# Patient Record
Sex: Female | Born: 1945
Health system: Southern US, Community
[De-identification: ages and names within clinical notes are randomized; demographics above are authoritative.]

## PROBLEM LIST (undated history)

## (undated) DIAGNOSIS — I5032 Chronic diastolic (congestive) heart failure: Secondary | ICD-10-CM

## (undated) DIAGNOSIS — F32A Depression, unspecified: Secondary | ICD-10-CM

## (undated) DIAGNOSIS — I252 Old myocardial infarction: Secondary | ICD-10-CM

## (undated) DIAGNOSIS — M797 Fibromyalgia: Secondary | ICD-10-CM

## (undated) DIAGNOSIS — A048 Other specified bacterial intestinal infections: Secondary | ICD-10-CM

## (undated) DIAGNOSIS — Z8673 Personal history of transient ischemic attack (TIA), and cerebral infarction without residual deficits: Secondary | ICD-10-CM

## (undated) DIAGNOSIS — F329 Major depressive disorder, single episode, unspecified: Secondary | ICD-10-CM

## (undated) DIAGNOSIS — M1990 Unspecified osteoarthritis, unspecified site: Secondary | ICD-10-CM

## (undated) DIAGNOSIS — I700 Atherosclerosis of aorta: Secondary | ICD-10-CM

## (undated) DIAGNOSIS — I5042 Chronic combined systolic (congestive) and diastolic (congestive) heart failure: Secondary | ICD-10-CM

## (undated) DIAGNOSIS — F1120 Opioid dependence, uncomplicated: Secondary | ICD-10-CM

## (undated) DIAGNOSIS — M5090 Cervical disc disorder, unspecified, unspecified cervical region: Secondary | ICD-10-CM

## (undated) DIAGNOSIS — Z860100 Personal history of colon polyps, unspecified: Secondary | ICD-10-CM

## (undated) DIAGNOSIS — T8859XA Other complications of anesthesia, initial encounter: Secondary | ICD-10-CM

## (undated) DIAGNOSIS — I2510 Atherosclerotic heart disease of native coronary artery without angina pectoris: Secondary | ICD-10-CM

## (undated) DIAGNOSIS — T4145XA Adverse effect of unspecified anesthetic, initial encounter: Secondary | ICD-10-CM

## (undated) DIAGNOSIS — G35 Multiple sclerosis: Secondary | ICD-10-CM

## (undated) DIAGNOSIS — F419 Anxiety disorder, unspecified: Secondary | ICD-10-CM

## (undated) DIAGNOSIS — I499 Cardiac arrhythmia, unspecified: Secondary | ICD-10-CM

## (undated) DIAGNOSIS — E785 Hyperlipidemia, unspecified: Secondary | ICD-10-CM

## (undated) DIAGNOSIS — Z8719 Personal history of other diseases of the digestive system: Secondary | ICD-10-CM

## (undated) DIAGNOSIS — J439 Emphysema, unspecified: Secondary | ICD-10-CM

## (undated) HISTORY — DX: Personal history of colon polyps, unspecified: Z86.0100

## (undated) HISTORY — DX: Hyperlipidemia, unspecified: E78.5

## (undated) HISTORY — DX: Anxiety disorder, unspecified: F41.9

## (undated) HISTORY — PX: OTHER SURGICAL HISTORY: SHX169

## (undated) HISTORY — DX: Personal history of other diseases of the digestive system: Z87.19

## (undated) HISTORY — DX: Emphysema, unspecified: J43.9

## (undated) HISTORY — DX: Other specified bacterial intestinal infections: A04.8

## (undated) HISTORY — DX: Old myocardial infarction: I25.2

## (undated) HISTORY — DX: Opioid dependence, uncomplicated: F11.20

## (undated) HISTORY — DX: Chronic combined systolic (congestive) and diastolic (congestive) heart failure: I50.42

## (undated) HISTORY — PX: CARDIAC CATHETERIZATION: SHX172

## (undated) HISTORY — DX: Multiple sclerosis: G35

## (undated) HISTORY — DX: Chronic diastolic (congestive) heart failure: I50.32

## (undated) HISTORY — DX: Atherosclerosis of aorta: I70.0

## (undated) HISTORY — DX: Unspecified osteoarthritis, unspecified site: M19.90

## (undated) HISTORY — DX: Personal history of transient ischemic attack (TIA), and cerebral infarction without residual deficits: Z86.73

## (undated) HISTORY — DX: Personal history of colonic polyps: Z86.010

## (undated) HISTORY — PX: TUBAL LIGATION: SHX77

## (undated) HISTORY — DX: Major depressive disorder, single episode, unspecified: F32.9

## (undated) HISTORY — DX: Depression, unspecified: F32.A

---

## 1998-03-02 ENCOUNTER — Other Ambulatory Visit: Admission: RE | Admit: 1998-03-02 | Discharge: 1998-03-02 | Payer: Self-pay | Admitting: Obstetrics and Gynecology

## 1998-03-23 ENCOUNTER — Ambulatory Visit (HOSPITAL_COMMUNITY): Admission: RE | Admit: 1998-03-23 | Discharge: 1998-03-23 | Payer: Self-pay | Admitting: *Deleted

## 1999-03-06 ENCOUNTER — Other Ambulatory Visit: Admission: RE | Admit: 1999-03-06 | Discharge: 1999-03-06 | Payer: Self-pay | Admitting: Obstetrics and Gynecology

## 1999-03-30 ENCOUNTER — Ambulatory Visit (HOSPITAL_COMMUNITY): Admission: RE | Admit: 1999-03-30 | Discharge: 1999-03-30 | Payer: Self-pay | Admitting: Sports Medicine

## 1999-03-30 ENCOUNTER — Encounter: Payer: Self-pay | Admitting: Sports Medicine

## 1999-04-13 ENCOUNTER — Encounter: Payer: Self-pay | Admitting: Sports Medicine

## 1999-04-13 ENCOUNTER — Ambulatory Visit (HOSPITAL_COMMUNITY): Admission: RE | Admit: 1999-04-13 | Discharge: 1999-04-13 | Payer: Self-pay | Admitting: Sports Medicine

## 2000-06-24 ENCOUNTER — Ambulatory Visit (HOSPITAL_COMMUNITY): Admission: RE | Admit: 2000-06-24 | Discharge: 2000-06-24 | Payer: Self-pay | Admitting: *Deleted

## 2001-06-01 ENCOUNTER — Other Ambulatory Visit: Admission: RE | Admit: 2001-06-01 | Discharge: 2001-06-01 | Payer: Self-pay | Admitting: Obstetrics and Gynecology

## 2001-11-28 ENCOUNTER — Emergency Department (HOSPITAL_COMMUNITY): Admission: EM | Admit: 2001-11-28 | Discharge: 2001-11-28 | Payer: Self-pay | Admitting: Emergency Medicine

## 2002-06-11 ENCOUNTER — Encounter: Payer: Self-pay | Admitting: Internal Medicine

## 2002-06-11 ENCOUNTER — Ambulatory Visit (HOSPITAL_COMMUNITY): Admission: RE | Admit: 2002-06-11 | Discharge: 2002-06-11 | Payer: Self-pay | Admitting: Internal Medicine

## 2003-08-02 ENCOUNTER — Encounter: Admission: RE | Admit: 2003-08-02 | Discharge: 2003-08-02 | Payer: Self-pay | Admitting: Internal Medicine

## 2003-08-12 ENCOUNTER — Other Ambulatory Visit: Admission: RE | Admit: 2003-08-12 | Discharge: 2003-08-12 | Payer: Self-pay | Admitting: Obstetrics and Gynecology

## 2004-11-04 ENCOUNTER — Emergency Department (HOSPITAL_COMMUNITY): Admission: EM | Admit: 2004-11-04 | Discharge: 2004-11-04 | Payer: Self-pay | Admitting: Emergency Medicine

## 2004-11-30 ENCOUNTER — Ambulatory Visit: Payer: Self-pay | Admitting: Internal Medicine

## 2004-12-06 ENCOUNTER — Ambulatory Visit: Payer: Self-pay | Admitting: Internal Medicine

## 2004-12-17 ENCOUNTER — Ambulatory Visit: Payer: Self-pay | Admitting: Internal Medicine

## 2004-12-21 ENCOUNTER — Ambulatory Visit: Payer: Self-pay | Admitting: Gastroenterology

## 2004-12-25 ENCOUNTER — Ambulatory Visit: Payer: Self-pay | Admitting: Gastroenterology

## 2005-01-01 ENCOUNTER — Emergency Department (HOSPITAL_COMMUNITY): Admission: EM | Admit: 2005-01-01 | Discharge: 2005-01-02 | Payer: Self-pay | Admitting: Emergency Medicine

## 2005-01-15 ENCOUNTER — Ambulatory Visit (HOSPITAL_COMMUNITY): Admission: RE | Admit: 2005-01-15 | Discharge: 2005-01-15 | Payer: Self-pay | Admitting: *Deleted

## 2005-01-15 ENCOUNTER — Encounter (INDEPENDENT_AMBULATORY_CARE_PROVIDER_SITE_OTHER): Payer: Self-pay | Admitting: *Deleted

## 2005-01-31 ENCOUNTER — Ambulatory Visit (HOSPITAL_COMMUNITY): Admission: RE | Admit: 2005-01-31 | Discharge: 2005-01-31 | Payer: Self-pay | Admitting: *Deleted

## 2005-02-18 ENCOUNTER — Encounter: Admission: RE | Admit: 2005-02-18 | Discharge: 2005-02-18 | Payer: Self-pay | Admitting: *Deleted

## 2005-09-27 ENCOUNTER — Other Ambulatory Visit: Admission: RE | Admit: 2005-09-27 | Discharge: 2005-09-27 | Payer: Self-pay | Admitting: Obstetrics and Gynecology

## 2006-11-25 ENCOUNTER — Other Ambulatory Visit: Admission: RE | Admit: 2006-11-25 | Discharge: 2006-11-25 | Payer: Self-pay | Admitting: Obstetrics and Gynecology

## 2007-01-17 ENCOUNTER — Inpatient Hospital Stay (HOSPITAL_COMMUNITY): Admission: EM | Admit: 2007-01-17 | Discharge: 2007-01-18 | Payer: Self-pay | Admitting: Emergency Medicine

## 2007-01-17 ENCOUNTER — Ambulatory Visit: Payer: Self-pay | Admitting: Internal Medicine

## 2008-06-01 ENCOUNTER — Encounter
Admission: RE | Admit: 2008-06-01 | Discharge: 2008-06-01 | Payer: Self-pay | Admitting: Physical Medicine & Rehabilitation

## 2008-07-04 ENCOUNTER — Ambulatory Visit (HOSPITAL_COMMUNITY): Admission: RE | Admit: 2008-07-04 | Discharge: 2008-07-04 | Payer: Self-pay | Admitting: Obstetrics and Gynecology

## 2008-11-15 ENCOUNTER — Encounter: Admission: RE | Admit: 2008-11-15 | Discharge: 2008-11-15 | Payer: Self-pay | Admitting: Internal Medicine

## 2008-11-29 ENCOUNTER — Encounter: Admission: RE | Admit: 2008-11-29 | Discharge: 2008-11-29 | Payer: Self-pay | Admitting: Gastroenterology

## 2008-12-09 DIAGNOSIS — F4310 Post-traumatic stress disorder, unspecified: Secondary | ICD-10-CM

## 2008-12-09 DIAGNOSIS — G35 Multiple sclerosis: Secondary | ICD-10-CM

## 2008-12-09 DIAGNOSIS — Z860100 Personal history of colon polyps, unspecified: Secondary | ICD-10-CM | POA: Insufficient documentation

## 2008-12-09 DIAGNOSIS — R32 Unspecified urinary incontinence: Secondary | ICD-10-CM

## 2008-12-09 DIAGNOSIS — F331 Major depressive disorder, recurrent, moderate: Secondary | ICD-10-CM | POA: Insufficient documentation

## 2008-12-09 DIAGNOSIS — IMO0002 Reserved for concepts with insufficient information to code with codable children: Secondary | ICD-10-CM | POA: Insufficient documentation

## 2008-12-09 DIAGNOSIS — Z8711 Personal history of peptic ulcer disease: Secondary | ICD-10-CM

## 2008-12-09 DIAGNOSIS — IMO0001 Reserved for inherently not codable concepts without codable children: Secondary | ICD-10-CM

## 2008-12-09 DIAGNOSIS — K589 Irritable bowel syndrome without diarrhea: Secondary | ICD-10-CM

## 2008-12-09 DIAGNOSIS — F411 Generalized anxiety disorder: Secondary | ICD-10-CM | POA: Insufficient documentation

## 2008-12-09 DIAGNOSIS — F329 Major depressive disorder, single episode, unspecified: Secondary | ICD-10-CM

## 2008-12-09 DIAGNOSIS — R109 Unspecified abdominal pain: Secondary | ICD-10-CM

## 2008-12-09 DIAGNOSIS — R0781 Pleurodynia: Secondary | ICD-10-CM

## 2008-12-09 DIAGNOSIS — M810 Age-related osteoporosis without current pathological fracture: Secondary | ICD-10-CM | POA: Insufficient documentation

## 2008-12-09 DIAGNOSIS — K5900 Constipation, unspecified: Secondary | ICD-10-CM | POA: Insufficient documentation

## 2009-04-13 ENCOUNTER — Other Ambulatory Visit: Admission: RE | Admit: 2009-04-13 | Discharge: 2009-04-13 | Payer: Self-pay | Admitting: Obstetrics and Gynecology

## 2009-11-01 HISTORY — PX: BACK SURGERY: SHX140

## 2009-11-18 ENCOUNTER — Emergency Department (HOSPITAL_COMMUNITY): Admission: EM | Admit: 2009-11-18 | Discharge: 2009-11-18 | Payer: Self-pay | Admitting: Emergency Medicine

## 2009-12-15 ENCOUNTER — Ambulatory Visit (HOSPITAL_COMMUNITY): Admission: RE | Admit: 2009-12-15 | Discharge: 2009-12-16 | Payer: Self-pay | Admitting: Neurological Surgery

## 2010-01-26 ENCOUNTER — Encounter: Admission: RE | Admit: 2010-01-26 | Discharge: 2010-01-26 | Payer: Self-pay | Admitting: Neurological Surgery

## 2010-02-07 ENCOUNTER — Encounter: Payer: Self-pay | Admitting: Neurological Surgery

## 2010-03-03 ENCOUNTER — Encounter: Payer: Self-pay | Admitting: Neurological Surgery

## 2010-04-05 ENCOUNTER — Ambulatory Visit (HOSPITAL_COMMUNITY): Admission: RE | Admit: 2010-04-05 | Discharge: 2010-04-05 | Payer: Self-pay | Admitting: Neurological Surgery

## 2010-04-25 ENCOUNTER — Encounter: Admission: RE | Admit: 2010-04-25 | Discharge: 2010-04-25 | Payer: Self-pay | Admitting: Internal Medicine

## 2010-05-14 ENCOUNTER — Ambulatory Visit (HOSPITAL_COMMUNITY)
Admission: RE | Admit: 2010-05-14 | Discharge: 2010-05-14 | Payer: Self-pay | Source: Home / Self Care | Attending: Neurological Surgery | Admitting: Neurological Surgery

## 2010-05-15 ENCOUNTER — Ambulatory Visit (HOSPITAL_COMMUNITY): Payer: Self-pay | Admitting: Psychiatry

## 2010-05-17 ENCOUNTER — Ambulatory Visit (HOSPITAL_COMMUNITY)
Admission: RE | Admit: 2010-05-17 | Discharge: 2010-05-17 | Payer: Self-pay | Source: Home / Self Care | Attending: Diagnostic Radiology | Admitting: Diagnostic Radiology

## 2010-06-01 ENCOUNTER — Ambulatory Visit (HOSPITAL_COMMUNITY): Payer: Self-pay | Admitting: Psychology

## 2010-06-04 ENCOUNTER — Ambulatory Visit (HOSPITAL_COMMUNITY): Payer: Self-pay | Admitting: Physician Assistant

## 2010-06-24 ENCOUNTER — Encounter: Payer: Self-pay | Admitting: Neurological Surgery

## 2010-07-05 ENCOUNTER — Encounter (HOSPITAL_COMMUNITY): Payer: Self-pay | Admitting: Physician Assistant

## 2010-07-09 ENCOUNTER — Other Ambulatory Visit: Payer: Self-pay | Admitting: Neurological Surgery

## 2010-07-18 ENCOUNTER — Other Ambulatory Visit: Payer: MEDICARE

## 2010-07-26 ENCOUNTER — Ambulatory Visit
Admission: RE | Admit: 2010-07-26 | Discharge: 2010-07-26 | Disposition: A | Discharge status: Home / Self Care | Payer: MEDICARE | Source: Ambulatory Visit | Attending: Neurological Surgery | Admitting: Neurological Surgery

## 2010-08-09 ENCOUNTER — Other Ambulatory Visit (HOSPITAL_COMMUNITY): Payer: Self-pay | Admitting: Nurse Practitioner

## 2010-08-09 ENCOUNTER — Other Ambulatory Visit: Payer: Self-pay | Admitting: Internal Medicine

## 2010-08-09 DIAGNOSIS — R911 Solitary pulmonary nodule: Secondary | ICD-10-CM

## 2010-08-14 LAB — CREATININE, SERUM
Creatinine, Ser: 0.64 mg/dL (ref 0.4–1.2)
GFR calc non Af Amer: >60 mL/min (ref >60)

## 2010-08-19 LAB — CBC
MCH: 32.8 pg (ref 26.0–34.0)
MCV: 95.7 fL (ref 78.0–100.0)
Platelets: 270 10*3/uL (ref 150–400)
RDW: 12.8 % (ref 11.5–15.5)
WBC: 9.9 10*3/uL (ref 4.0–10.5)

## 2010-08-19 LAB — DIFFERENTIAL
Basophils Relative: 0 % (ref 0–1)
Lymphocytes Relative: 28 % (ref 12–46)
Neutro Abs: 5.8 10*3/uL (ref 1.7–7.7)
Neutrophils Relative %: 59 % (ref 43–77)

## 2010-08-19 LAB — SURGICAL PCR SCREEN
MRSA, PCR: NEGATIVE
Staphylococcus aureus: POSITIVE — AB

## 2010-08-19 LAB — BASIC METABOLIC PANEL
CO2: 32 mEq/L (ref 19–32)
Calcium: 10.4 mg/dL (ref 8.4–10.5)
GFR calc non Af Amer: >60 mL/min (ref >60)
Potassium: 4.5 mEq/L (ref 3.5–5.1)

## 2010-08-19 LAB — APTT: aPTT: 26 seconds (ref 24–37)

## 2010-08-19 LAB — PROTIME-INR: INR: 0.93 (ref 0.00–1.49)

## 2010-08-24 ENCOUNTER — Ambulatory Visit (HOSPITAL_COMMUNITY)
Admission: RE | Admit: 2010-08-24 | Discharge: 2010-08-24 | Disposition: A | Discharge status: Home / Self Care | Payer: MEDICARE | Source: Ambulatory Visit | Attending: Nurse Practitioner | Admitting: Nurse Practitioner

## 2010-08-24 ENCOUNTER — Other Ambulatory Visit: Payer: MEDICARE

## 2010-08-24 DIAGNOSIS — Z09 Encounter for follow-up examination after completed treatment for conditions other than malignant neoplasm: Secondary | ICD-10-CM | POA: Insufficient documentation

## 2010-08-24 DIAGNOSIS — R911 Solitary pulmonary nodule: Secondary | ICD-10-CM

## 2010-08-24 DIAGNOSIS — J984 Other disorders of lung: Secondary | ICD-10-CM | POA: Insufficient documentation

## 2010-08-30 ENCOUNTER — Other Ambulatory Visit: Payer: Self-pay | Admitting: Internal Medicine

## 2010-08-30 DIAGNOSIS — E041 Nontoxic single thyroid nodule: Secondary | ICD-10-CM

## 2010-09-05 ENCOUNTER — Ambulatory Visit
Admission: RE | Admit: 2010-09-05 | Discharge: 2010-09-05 | Disposition: A | Discharge status: Home / Self Care | Payer: MEDICARE | Source: Ambulatory Visit | Attending: Internal Medicine | Admitting: Internal Medicine

## 2010-09-05 DIAGNOSIS — E041 Nontoxic single thyroid nodule: Secondary | ICD-10-CM

## 2010-09-17 ENCOUNTER — Encounter (HOSPITAL_BASED_OUTPATIENT_CLINIC_OR_DEPARTMENT_OTHER): Payer: Medicare Other | Admitting: Internal Medicine

## 2010-09-17 DIAGNOSIS — C3490 Malignant neoplasm of unspecified part of unspecified bronchus or lung: Secondary | ICD-10-CM

## 2010-09-17 DIAGNOSIS — M810 Age-related osteoporosis without current pathological fracture: Secondary | ICD-10-CM

## 2010-09-17 DIAGNOSIS — E7800 Pure hypercholesterolemia, unspecified: Secondary | ICD-10-CM

## 2010-09-17 DIAGNOSIS — J449 Chronic obstructive pulmonary disease, unspecified: Secondary | ICD-10-CM

## 2010-09-21 ENCOUNTER — Encounter: Payer: Self-pay | Admitting: Internal Medicine

## 2010-09-21 ENCOUNTER — Ambulatory Visit (INDEPENDENT_AMBULATORY_CARE_PROVIDER_SITE_OTHER): Payer: Medicare Other | Admitting: Internal Medicine

## 2010-09-21 VITALS — BP 130/78

## 2010-09-21 DIAGNOSIS — R911 Solitary pulmonary nodule: Secondary | ICD-10-CM

## 2010-09-21 DIAGNOSIS — J984 Other disorders of lung: Secondary | ICD-10-CM

## 2010-09-21 DIAGNOSIS — R0600 Dyspnea, unspecified: Secondary | ICD-10-CM | POA: Insufficient documentation

## 2010-09-21 DIAGNOSIS — R0609 Other forms of dyspnea: Secondary | ICD-10-CM

## 2010-09-21 DIAGNOSIS — R0989 Other specified symptoms and signs involving the circulatory and respiratory systems: Secondary | ICD-10-CM

## 2010-09-21 NOTE — Progress Notes (Signed)
  Subjective:    Patient ID: [name], female    DOB: 1945-07-04, 65 y.o.   MRN: [id number]  HPI 65 year old smoker, DJD, OA, Fibromyalgia, Osteoporosis, Hiatal Hernia., GERD, Thyroid nodules and on chronic narcs for chronic back pain, hix of skin cancers  Referred for LUL 8 mm apical-medial nodule by Dr. Shirline Frees. First had MRI mid-dec 2011 and noted to have LUL nodule. Followed by CT chest that confirmed same. Fu CT chest 3 months later in March showed no change (independently reviewed). She currently feels at baseline health with chronic pain and baseline dyspnea. States dyspnea is chronic, relieved by pro-air, brought on by treadmill exertion, present for few years, worse past few months, class 2 severity. Also, reports associated associated "burning all over lungs" that comes on treadmill but goes away after continued 10 minute of exertion. Associated chronic cough with clear mild mucoid sputum with occassional yellow sputum. REcollects cath 2002; had RCA spasm per records  She is not interested in quitting smoking, visiting MDs unless she has to, seeing cards for chest pain. Does not want me to be more aggressive with followup and workup of dyspnea   Review of Systems  Constitutional: Negative.   HENT: Negative.   Eyes: Negative.   Respiratory: Positive for cough and shortness of breath. Negative for choking, chest tightness and wheezing.   Cardiovascular: Positive for chest pain.  Gastrointestinal: Negative.   Genitourinary: Negative.   Musculoskeletal: Positive for myalgias, back pain and arthralgias.  Skin: Negative.   Neurological: Negative.   Hematological: Negative.   Psychiatric/Behavioral: Negative.          Objective:   Physical Exam  Vitals reviewed. Constitutional: She is oriented to person, place, and time. She appears well-developed and well-nourished. No distress.  HENT:  Head: Normocephalic and atraumatic.  Right Ear: External ear normal.  Left Ear:  External ear normal.  Mouth/Throat: Oropharynx is clear and moist. No oropharyngeal exudate.  Eyes: Conjunctivae and EOM are normal. Pupils are equal, round, and reactive to light. Right eye exhibits no discharge. Left eye exhibits no discharge. No scleral icterus.  Neck: Normal range of motion. Neck supple. No JVD present. No tracheal deviation present. No thyromegaly present.  Cardiovascular: Normal rate, regular rhythm, normal heart sounds and intact distal pulses.  Exam reveals no gallop and no friction rub.   No murmur heard. Pulmonary/Chest: Effort normal and breath sounds normal. No respiratory distress. She has no wheezes. She has no rales. She exhibits no tenderness.  Abdominal: Soft. Bowel sounds are normal. She exhibits no distension and no mass. There is no tenderness. There is no rebound and no guarding.  Musculoskeletal: Normal range of motion. She exhibits no edema and no tenderness.  Lymphadenopathy:    She has no cervical adenopathy.  Neurological: She is alert and oriented to person, place, and time. She has normal reflexes. No cranial nerve deficit. She exhibits normal muscle tone. Coordination normal.  Skin: Skin is warm and dry. No rash noted. She is not diaphoretic. No erythema. No pallor.  Psychiatric: She has a normal mood and affect. Her behavior is normal. Judgment and thought content normal.          Assessment & Plan:

## 2010-09-21 NOTE — Assessment & Plan Note (Signed)
Possibly due to undiagnosed copd   PLAN Spirometry at [facility]up Suggested she cards for angina equivalent bu she refused

## 2010-09-21 NOTE — Assessment & Plan Note (Signed)
LUL 8mm nodule noted Dec 2011. Stable march 2012. INtermediate prob for lung cancer due to age and smoking  PLAn Recommended ct chest in 3 months but she wants to do it only in 6 months

## 2010-09-21 NOTE — Patient Instructions (Signed)
Have repeat ct chest in 6 months Have simple spirometry at followup for workup of your shortness of breath Please quit smoking

## 2010-10-05 ENCOUNTER — Institutional Professional Consult (permissible substitution): Payer: Medicare Other | Admitting: Emergency Medicine

## 2010-10-16 NOTE — H&P (Signed)
[name], POL NO.:  [id number]   MEDICAL RECORD NO.:  [id number]          PATIENT TYPE:  INP   LOCATION:  1827                         FACILITY:  [facility]   PHYSICIAN:  Corwin Levins, MD      DATE OF BIRTH:  1946-02-05   DATE OF ADMISSION:  01/17/2007  DATE OF DISCHARGE:                              HISTORY & PHYSICAL   CHIEF COMPLAINT:  Burning mid and epigastric pain starting at 1:30 a.m.   HISTORY OF PRESENT ILLNESS:  [name] is a 65 year old white female  who was awakened at 1:30 this morning with severe pain and some  radiation towards the left chest with nausea and sweats; there was some  mild radiation towards the back as well.  She had some dry heaves, some  bowel change, but no hematemesis; there was some low-grade temperature.  She has chronic constipation, but does not think this is the factor.  She was noted to have elevated lipase in the ER.  She is now for  admission for further evaluation and treatment.  No prior history of  pancreatitis.  She was treated for H. pylori-positive in 2006 and no  documented history of peptic ulcer disease.  In fact, EGD negative 2006  although the patient was told she may have had some peptic ulcers that  had resolved by the time she had the EGD.   PAST MEDICAL HISTORY/ILLNESSES:  1. Coronary artery disease, noncritical by cath in 2002.  2. History of coronary vasospasm noted on catheterization in 2002.  3. Chronic smoker.  4. COPD.  5. Hypercholesterolemia.  6. Osteoporosis.  7. Fibromyalgia.  8. Anxiety.  9. Irritable bowel syndrome.  10.Multiple sclerosis diagnosed 12 years ago by [facility].  11.DJD.  12.LS spine disc disease.  13.Chronic sinusitis.   ALLERGIES:  SULFA.   CURRENT MEDICATIONS:  1. Aciphex 20 mg p.o. q.day.  2. Allegra one 81 mg p.o. q.day.  3. Doxepin 25 mg q.h.s.  4. Klonopin 0.5 mg t.i.d.  5. Neurontin 300 mg t.i.d.  6. Zetia 10 mg p.o. q.day.  7. Vicodin 7.5/500  p.r.n.  8. Provigil 200 mg t.i.d.  9. Premarin, unknown dose, one q.day.   SOCIAL HISTORY:  Tobacco, now less than one pack per day.  Alcohol:  None per patient.  She is currently separated.   FAMILY HISTORY:  Coronary artery disease; brother deceased with  pancreatic cancer as well.   REVIEW OF SYSTEMS:  Otherwise noncontributory.   PHYSICAL EXAMINATION:  VITAL SIGNS:  She is afebrile, blood pressure  148/68, heart rate 84, respirations 16, O2 saturation 98%.  HEENT EXAM:  Sclerae clear.  TMs clear.  Oropharynx benign.  NECK:  Without lymphadenopathy, JVD or thyromegaly.  CHEST:  No rales or wheezing.  CARDIAC EXAM:  Regular rate and rhythm.  ABDOMEN:  With moderate epigastric and mid-abdominal tenderness.  EXTREMITIES:  No edema.   LABS:  UA negative.  Lipase 187.  Sodium 134, potassium 3.4, chloride  97, bicarb 30, BUN 9, creatinine 0.7, glucose 77, white blood cell count  12.3, hemoglobin 13.9.  LFTs within  normal limits.  CPK-MB 2.0.  Troponin I less than 0.05.  Calcium 8.5.   ASSESSMENT/PLAN:  1. Acute pancreatitis.  To be admitted, made NPO and given IV fluids.      Will treat for symptoms.  Check CT of the abdomen and pelvis.  She      denies alcohol as a cause.  Will also check lipids.  Calcium normal      as above.  Doubt medication as cause.  2. Hypokalemia.  Replaced IV.  3. Hyponatremia.  IV fluids as above.  4. Other medical problems as listed above, otherwise stable.  Continue      home medications.  5. Prophylaxis.  Start proton pump inhibitor therapy and Lovenox      subcutaneously.  6. Code status.  Full.  7. Disposition for home when improved.      Corwin Levins, MD  Electronically Signed     JWJ/MEDQ  D:  01/17/2007  T:  01/17/2007  Job:  [id number]   cc:   Gretta Arab. Valentina Lucks, M.D.

## 2010-10-19 NOTE — Cardiovascular Report (Signed)
[place]  Patient:    [name], [name]                      MRN: [id number] Proc. Date: 06/24/00 Adm. Date:  [id number] Attending:  Meade Maw A                        Cardiac Catheterization  INDICATIONS:  Chest pain associated with residual uptake in the anterior apex on a Cardiolite.  BRIEF HISTORY:  [name] is a 65 year old female who was initially referred for evaluation of chest pain which occurred with excessive exertion. She would develop chest tightness and burning associated with shortness of breath while ambulating on inclines.  Coronary risk factors are significant for a strong family history, dyslipidemia, tobacco abuse.  DESCRIPTION OF PROCEDURE:  After obtaining written informed consent, the patient was brought to the cardiac catheterization lab in the postabsorptive state. Preoperative sedation was achieved using IV Versed.  The right groin was prepped and draped in the usual sterile fashion.  Local anesthesia was achieved using 1% Xylocaine.  A 6 French hemostasis sheath was placed into the right femoral artery.  Selective coronary angiography was performed using a JL4, JR4 Judkins catheter.  Single plane ventriculogram was performed in the RAO position using a 6 French pigtail curved catheter.  Multiple views were obtained.  The hemostasis sheath was flushed following each engagement.  FINDINGS:  Aortic pressure was 125/69, LV pressure was 125/14.  There was no significant gradient noted on pullback.  Single plane ventriculogram revealed normal wall motion with an ejection fraction of 65%.  CORONARY ANGIOGRAPHY:  The left main coronary artery bifurcated into the left anterior descending and circumflex vessel.  There was no significant disease in the left main coronary artery.  The left anterior descending.  The left anterior descending gave rise to a large D1 and large D2, and ended at the apex.  There was a 30 to  40% midvessel long lesion following the second diagonal.  Circumflex vessel.  The circumflex gave rise to a small OM1, OM2, and a moderate to large OM3 and went on to end as an AV groove vessel.  There was no significant disease in the circumflex vessel.  Right coronary artery.  There was initial spasm with engagement of the right coronary artery.  The right coronary artery gave rise to three small RV marginals and ended as a large PDA and large PL branch.  IMPRESSION: 1. Noncritical coronary artery disease involving the mid LAD. 2. Significant spasm of the right coronary artery with initial engagement. 3. Normal left ventricular function.  It is possible that the patients ongoing tobacco use is exacerbating the coronary spasm which could be resulting in chest pain.  The initial approach would be smoking cessation.  If this is unsuccessful would add a calcium channel blocker.  The patient was transferred to the holding area.  The hemostasis sheath was removed.  Hemostasis was achieved using digital pressure. DD:  06/24/00 TD:  06/24/00 Job: [id number]

## 2010-12-11 ENCOUNTER — Ambulatory Visit (HOSPITAL_COMMUNITY)
Admission: RE | Admit: 2010-12-11 | Discharge: 2010-12-11 | Disposition: A | Discharge status: Home / Self Care | Payer: Medicare Other | Source: Ambulatory Visit | Attending: Cardiology | Admitting: Cardiology

## 2010-12-11 DIAGNOSIS — E785 Hyperlipidemia, unspecified: Secondary | ICD-10-CM | POA: Insufficient documentation

## 2010-12-11 DIAGNOSIS — R079 Chest pain, unspecified: Secondary | ICD-10-CM | POA: Insufficient documentation

## 2010-12-11 DIAGNOSIS — IMO0001 Reserved for inherently not codable concepts without codable children: Secondary | ICD-10-CM | POA: Insufficient documentation

## 2010-12-11 DIAGNOSIS — K219 Gastro-esophageal reflux disease without esophagitis: Secondary | ICD-10-CM | POA: Insufficient documentation

## 2010-12-11 DIAGNOSIS — R0609 Other forms of dyspnea: Secondary | ICD-10-CM | POA: Insufficient documentation

## 2010-12-11 DIAGNOSIS — Z7982 Long term (current) use of aspirin: Secondary | ICD-10-CM | POA: Insufficient documentation

## 2010-12-11 DIAGNOSIS — Z8711 Personal history of peptic ulcer disease: Secondary | ICD-10-CM | POA: Insufficient documentation

## 2010-12-11 DIAGNOSIS — Z79899 Other long term (current) drug therapy: Secondary | ICD-10-CM | POA: Insufficient documentation

## 2010-12-11 DIAGNOSIS — K449 Diaphragmatic hernia without obstruction or gangrene: Secondary | ICD-10-CM | POA: Insufficient documentation

## 2010-12-11 DIAGNOSIS — M810 Age-related osteoporosis without current pathological fracture: Secondary | ICD-10-CM | POA: Insufficient documentation

## 2010-12-11 DIAGNOSIS — I2510 Atherosclerotic heart disease of native coronary artery without angina pectoris: Secondary | ICD-10-CM | POA: Insufficient documentation

## 2010-12-11 DIAGNOSIS — K589 Irritable bowel syndrome without diarrhea: Secondary | ICD-10-CM | POA: Insufficient documentation

## 2010-12-11 DIAGNOSIS — J4489 Other specified chronic obstructive pulmonary disease: Secondary | ICD-10-CM | POA: Insufficient documentation

## 2010-12-11 DIAGNOSIS — J449 Chronic obstructive pulmonary disease, unspecified: Secondary | ICD-10-CM | POA: Insufficient documentation

## 2010-12-11 DIAGNOSIS — R0989 Other specified symptoms and signs involving the circulatory and respiratory systems: Secondary | ICD-10-CM | POA: Insufficient documentation

## 2010-12-22 NOTE — Cardiovascular Report (Signed)
NAMECARLINDA, OHLSON NO.:  [id number]  MEDICAL RECORD NO.:  [id number]  LOCATION:  6522                         FACILITY:  [facility]  PHYSICIAN:  Pamella Pert, MD DATE OF BIRTH:  05-28-1946  DATE OF PROCEDURE:  12/11/2010 DATE OF DISCHARGE:  12/11/2010                           CARDIAC CATHETERIZATION   PROCEDURE PERFORMED: 1. Left ventriculography. 2. Selective right and left coronary arteriography.  INDICATION:  Ms. Yusupov is a pleasant 65 year old female with history of hyperlipidemia who was complaining of chest discomfort.  She also has abnormal EKG in the form of left bundle-branch block.  She underwent outpatient stress testing, which had revealed anterior wall ischemia with severe LV systolic dysfunction.  Given this and multiple cardiovascular risk factors, she is brought to the cardiac cath lab to evaluate for coronary anatomy.  HEMODYNAMIC DATA:  Left ventricular pressure was 133/-5 with end- diastolic pressure of 5 mmHg.  Aortic pressure was 131/63 with a mean of 88 mmHg.  There is no pressure gradient across the aortic valve.  ANGIOGRAPHIC DATA:  Left ventricle.  Left ventricular systolic function was in the lower limit of normal with ejection fraction of 45% to 50% with mild global hypokinesis.  There was no significant mitral regurgitation.  Right coronary artery.  Right coronary artery is a large-caliber vessel and a dominant vessel.  Smooth and normal.  Left main coronary artery.  Left main coronary artery is a large-caliber vessel.  Smooth and normal.  LAD.  LAD is a large-caliber vessel giving origin to a very large diagonal one which is equivalent to the LAD.  There is very mild luminal irregularity.  Circumflex coronary artery:  Circumflex coronary artery is a large- caliber vessel which is smooth and normal.  It gives origin to large high obtuse marginal one, which is ramus intermediate.  The circumflex itself is smooth and  normal.  Ramus intermediate.  Ramus intermediate is a moderate-to-large caliber vessel.  There is a proximal hazy 80% stenosis.  IMPRESSION:  Single-vessel coronary artery disease involving the ramus intermediate branch of the circumflex coronary artery.  RECOMMENDATIONS:  The stress test does not correlate with the ramus intermediate stenosis.  However, the stenosis is tight.  I suspect the abnormal stress test was related to left bundle-branch block.  I am going to attempt to do aggressive medical therapy as the anatomy is not necessarily very conducive for angioplasty, but it is doable.  If medical therapy fails and she continues to have chest discomfort, then I will consider angioplasty of the ramus intermediate branch.  This vessel with at least a 2.75 to up to 3 mm size.  TECHNIQUE OF THE PROCEDURE:  Under sterile precautions using a 6-French right radial access, 6-French multipurpose B2 catheter was advanced into the ascending aorta then to the left ventricle.  Left ventriculography was performed both in LAO and RAO projection.  Catheter pulled into the ascending aorta.  Right coronary artery selectively engaged. Angiography was performed and catheter was pulled out of body over exchange length J-wire.  Using Judkins left 3.5 diagnostic 5-French catheter, I was able to engage left main coronary artery and angiography was performed. Multiple views were obtained because  of difficulty and overlap.  Having perform angiograms, we decided to take the catheters out or the J-wire. Hemostasis was obtained by applying TR band.  The patient tolerated the procedure well.  No apparent complications.     Pamella Pert, MD     JRG/MEDQ  D:  12/11/2010  T:  12/12/2010  Job:  [id number]  cc:   Tomi Bamberger, NP  Electronically Signed by Yates Decamp MD on 12/22/2010 01:54:21 PM

## 2011-02-19 ENCOUNTER — Telehealth: Payer: Self-pay | Admitting: Internal Medicine

## 2011-02-19 DIAGNOSIS — R911 Solitary pulmonary nodule: Secondary | ICD-10-CM

## 2011-02-19 NOTE — Telephone Encounter (Signed)
Pt states it is time for ct scan and follow-up with MR. I have entered a new order for ct chest w/o contrast and once we have a date for that we can get her scheduled for follow-up with MR. Pt is aware that our PCC's will call her with this appt.

## 2011-03-08 ENCOUNTER — Ambulatory Visit (INDEPENDENT_AMBULATORY_CARE_PROVIDER_SITE_OTHER)
Admission: RE | Admit: 2011-03-08 | Discharge: 2011-03-08 | Disposition: A | Discharge status: Home / Self Care | Payer: Medicare Other | Source: Ambulatory Visit | Attending: Internal Medicine | Admitting: Internal Medicine

## 2011-03-08 DIAGNOSIS — R911 Solitary pulmonary nodule: Secondary | ICD-10-CM

## 2011-03-08 DIAGNOSIS — J984 Other disorders of lung: Secondary | ICD-10-CM

## 2011-03-14 ENCOUNTER — Encounter: Payer: Self-pay | Admitting: Internal Medicine

## 2011-03-14 ENCOUNTER — Ambulatory Visit (INDEPENDENT_AMBULATORY_CARE_PROVIDER_SITE_OTHER): Payer: Medicare Other | Admitting: Internal Medicine

## 2011-03-14 VITALS — BP 140/82 | HR 71 | Temp 98.0°F | Ht 61.0 in | Wt 118.8 lb

## 2011-03-14 DIAGNOSIS — R0602 Shortness of breath: Secondary | ICD-10-CM

## 2011-03-14 DIAGNOSIS — F4321 Adjustment disorder with depressed mood: Secondary | ICD-10-CM

## 2011-03-14 DIAGNOSIS — F17200 Nicotine dependence, unspecified, uncomplicated: Secondary | ICD-10-CM

## 2011-03-14 DIAGNOSIS — J984 Other disorders of lung: Secondary | ICD-10-CM

## 2011-03-14 DIAGNOSIS — R0600 Dyspnea, unspecified: Secondary | ICD-10-CM

## 2011-03-14 DIAGNOSIS — F4320 Adjustment disorder, unspecified: Secondary | ICD-10-CM

## 2011-03-14 DIAGNOSIS — R911 Solitary pulmonary nodule: Secondary | ICD-10-CM
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
## 2011-03-14 NOTE — Patient Instructions (Signed)
#  smoking  - glad you are working on quitting #Shortness of breath   - I think this is related to heart #Lung nodule - have ct chest without contrast in 6 months and followup then #Grief  - our sincere condolences on the passing away of your beloved dog #ROV  - 6 months with ct chest

## 2011-03-15 LAB — CBC
HCT: 37.7
HCT: 40.3
Hemoglobin: 13.9
MCHC: 34.4
MCHC: 34.4
MCV: 92.9
MCV: 93.3
Platelets: 250
Platelets: 250
RBC: 4.31
RDW: 12.4
RDW: 13.3
WBC: 12.3 — ABNORMAL HIGH

## 2011-03-15 LAB — BASIC METABOLIC PANEL
BUN: 2 — ABNORMAL LOW
CO2: 25
Chloride: 109
Glucose, Bld: 94
Potassium: 4

## 2011-03-15 LAB — URINALYSIS, ROUTINE W REFLEX MICROSCOPIC
Bilirubin Urine: NEGATIVE
Glucose, UA: NEGATIVE
Ketones, ur: NEGATIVE
Leukocytes, UA: NEGATIVE
Nitrite: NEGATIVE
Protein, ur: NEGATIVE
Specific Gravity, Urine: 1.007
Urobilinogen, UA: 0.2
pH: 7

## 2011-03-15 LAB — LIPID PANEL
Triglycerides: 94
VLDL: 19

## 2011-03-15 LAB — LIPASE, BLOOD: Lipase: 187 — ABNORMAL HIGH

## 2011-03-15 LAB — URINE MICROSCOPIC-ADD ON

## 2011-03-15 LAB — COMPREHENSIVE METABOLIC PANEL
ALT: 24
AST: 35
CO2: 30
Chloride: 97
Creatinine, Ser: 0.72
GFR calc Af Amer: >60
GFR calc non Af Amer: >60
Glucose, Bld: 77
Sodium: 134 — ABNORMAL LOW
Total Bilirubin: 0.4

## 2011-03-15 LAB — POCT CARDIAC MARKERS
CKMB, poc: 2
Myoglobin, poc: 38.8
Operator id: 284251
Troponin i, poc: <0.05

## 2011-03-15 LAB — DIFFERENTIAL
Basophils Absolute: 0
Basophils Relative: 0
Eosinophils Absolute: 0.3
Eosinophils Relative: 2
Lymphocytes Relative: 15
Lymphs Abs: 1.8
Monocytes Absolute: 0.9 — ABNORMAL HIGH
Monocytes Relative: 7
Neutro Abs: 9.2 — ABNORMAL HIGH
Neutrophils Relative %: 75

## 2011-03-15 LAB — COMPREHENSIVE METABOLIC PANEL WITH GFR
Albumin: 3.5
Alkaline Phosphatase: 79
BUN: 9
Calcium: 8.5
Potassium: 3.4 — ABNORMAL LOW
Total Protein: 5.6 — ABNORMAL LOW

## 2011-03-31 ENCOUNTER — Ambulatory Visit (HOSPITAL_COMMUNITY)
Admission: EM | Admit: 2011-03-31 | Discharge: 2011-04-01 | Disposition: A | Discharge status: Home / Self Care | Payer: Medicare Other | Attending: Cardiology | Admitting: Cardiology

## 2011-03-31 ENCOUNTER — Emergency Department (HOSPITAL_COMMUNITY): Payer: Medicare Other

## 2011-03-31 DIAGNOSIS — E785 Hyperlipidemia, unspecified: Secondary | ICD-10-CM | POA: Insufficient documentation

## 2011-03-31 DIAGNOSIS — M549 Dorsalgia, unspecified: Secondary | ICD-10-CM | POA: Insufficient documentation

## 2011-03-31 DIAGNOSIS — F17200 Nicotine dependence, unspecified, uncomplicated: Secondary | ICD-10-CM | POA: Insufficient documentation

## 2011-03-31 DIAGNOSIS — J4489 Other specified chronic obstructive pulmonary disease: Secondary | ICD-10-CM | POA: Insufficient documentation

## 2011-03-31 DIAGNOSIS — I200 Unstable angina: Secondary | ICD-10-CM | POA: Insufficient documentation

## 2011-03-31 DIAGNOSIS — I10 Essential (primary) hypertension: Secondary | ICD-10-CM | POA: Insufficient documentation

## 2011-03-31 DIAGNOSIS — G8929 Other chronic pain: Secondary | ICD-10-CM | POA: Insufficient documentation

## 2011-03-31 DIAGNOSIS — I2510 Atherosclerotic heart disease of native coronary artery without angina pectoris: Secondary | ICD-10-CM | POA: Insufficient documentation

## 2011-03-31 DIAGNOSIS — J449 Chronic obstructive pulmonary disease, unspecified: Secondary | ICD-10-CM | POA: Insufficient documentation

## 2011-03-31 LAB — CBC
MCH: 31.6 pg (ref 26.0–34.0)
MCHC: 35.5 g/dL (ref 30.0–36.0)
Platelets: 193 10*3/uL (ref 150–400)
RBC: 4.08 MIL/uL (ref 3.87–5.11)
RDW: 12.2 % (ref 11.5–15.5)

## 2011-03-31 LAB — BASIC METABOLIC PANEL
BUN: 9 mg/dL (ref 6–23)
CO2: 25 mEq/L (ref 19–32)
Calcium: 9.7 mg/dL (ref 8.4–10.5)
Creatinine, Ser: 0.58 mg/dL (ref 0.50–1.10)
GFR calc non Af Amer: >90 mL/min (ref >90)
Glucose, Bld: 115 mg/dL — ABNORMAL HIGH (ref 70–99)

## 2011-03-31 LAB — DIFFERENTIAL
Basophils Relative: 0 % (ref 0–1)
Eosinophils Absolute: 0.2 10*3/uL (ref 0.0–0.7)
Eosinophils Relative: 2 % (ref 0–5)
Monocytes Absolute: 0.4 10*3/uL (ref 0.1–1.0)
Monocytes Relative: 4 % (ref 3–12)
Neutrophils Relative %: 84 % — ABNORMAL HIGH (ref 43–77)

## 2011-03-31 LAB — PROTIME-INR: Prothrombin Time: 12.7 seconds (ref 11.6–15.2)

## 2011-04-01 DIAGNOSIS — F4320 Adjustment disorder, unspecified: Secondary | ICD-10-CM | POA: Insufficient documentation

## 2011-04-01 DIAGNOSIS — F4321 Adjustment disorder with depressed mood: Secondary | ICD-10-CM | POA: Insufficient documentation

## 2011-04-01 DIAGNOSIS — Z87891 Personal history of nicotine dependence: Secondary | ICD-10-CM | POA: Insufficient documentation

## 2011-04-01 LAB — CBC
Hemoglobin: 12.9 g/dL (ref 12.0–15.0)
MCH: 31.3 pg (ref 26.0–34.0)
MCV: 89.6 fL (ref 78.0–100.0)
RBC: 4.12 MIL/uL (ref 3.87–5.11)
WBC: 7.4 10*3/uL (ref 4.0–10.5)

## 2011-04-01 LAB — COMPREHENSIVE METABOLIC PANEL
ALT: 42 U/L — ABNORMAL HIGH (ref 0–35)
AST: 51 U/L — ABNORMAL HIGH (ref 0–37)
Albumin: 3.5 g/dL (ref 3.5–5.2)
CO2: 28 mEq/L (ref 19–32)
Calcium: 9.4 mg/dL (ref 8.4–10.5)
Chloride: 101 mEq/L (ref 96–112)
Creatinine, Ser: 0.64 mg/dL (ref 0.50–1.10)
GFR calc non Af Amer: >90 mL/min (ref >90)
Sodium: 136 mEq/L (ref 135–145)
Total Bilirubin: 0.2 mg/dL — ABNORMAL LOW (ref 0.3–1.2)

## 2011-04-01 LAB — CARDIAC PANEL(CRET KIN+CKTOT+MB+TROPI)
Relative Index: INVALID (ref 0.0–2.5)
Troponin I: <0.3 ng/mL (ref <0.30)
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
## 2011-04-01 NOTE — Assessment & Plan Note (Signed)
#  Shortness of breath - Spriometry Oct 2012 - normal. Cath July 2012: ef 45%. Dyspnea likely cardiac in nature   - I think this is related to heart  PLAN Fu with Dr Shon Baton When she  Is emotinally ready I will rfer to cardiac/pulmonary rehab

## 2011-04-01 NOTE — Assessment & Plan Note (Signed)
LUL 8mm nodule. Dec 2011 -> March 2012 -> Oct 2012 stable. Intermediate prob for cancer. On surveilance  PLAN  - have ct chest without contrast in 6 months and followup then

## 2011-04-01 NOTE — Assessment & Plan Note (Signed)
#  smoking  - glad you are working on quitting

## 2011-04-02 NOTE — Cardiovascular Report (Signed)
NAMETIASIA, WEBERG NO.:  [id number]  MEDICAL RECORD NO.:  [id number]  LOCATION:  2022                         FACILITY:  [facility]  PHYSICIAN:  Pamella Pert, MD DATE OF BIRTH:  1945/08/26  DATE OF PROCEDURE:  04/01/2011 DATE OF DISCHARGE:  04/01/2011                           CARDIAC CATHETERIZATION   PROCEDURE PERFORMED: 1. Left ventriculography. 2. Selective right and left coronary angiography. 3. Right femoral arteriography and closure of right femoral arterial     access with Perclose.  INDICATION:  Ms. [name] is a a 65 year old female who has known coronary artery disease.  She has a moderate sized small area distribution obtuse marginal 1 (ramus intermediate stenosis by cardiac catheterization that was done in July 2012).  She has been aggressively managed in the outpatient basis for chronic stable angina.  She was admitted yesterday with symptoms suggestive of unstable angina.  She is now brought back to the cardiac catheterization lab with intention of relook at the coronary anatomy and also possible angioplasty.  HEMODYNAMIC DATA:  The left ventricular pressure was 108/5 with end diastolic pressure of 16 mmHg.  Aortic pressure was 106/60 with a mean of 79 mmHg.  There was no pressure gradient across the aortic valve.  ANGIOGRAPHIC DATA:  Left ventricle:  Left ventricle systolic function was normal with ejection fraction of 55%.  There is mild mid to distal anterolateral hypokinesis probably related to underlying left bundle- branch block.  No significant mitral regurgitation was evident.  Right coronary artery:  Right coronary artery is a large vessel and a dominant vessel.  It is smooth and normal.  Left main coronary artery:  Left main coronary artery is a large caliber vessel.  It is smooth and normal.  LAD:  LAD is a large vessel giving origin to large diagonal 1.  LAD and diagonal 1 are equal sized and slightly smooth with  very mild luminal irregularity.  There is mild calcification noted in the proximal segment.  Circumflex coronary artery:  Circumflex coronary artery is a large caliber vessel which is smooth and normal.  It gives origin to high obtuse marginal 1.  This obtuse marginal 1 is a moderate calibered vessel in the proximal segment, however, quickly tapers off from the mid to distal segment and supplies relatively a smaller area which is acute angle takeoff across the proximal circumflex.  Circumflex itself is a large caliber vessel.  In spite of multiple angulations, angulated views, I was unable to see the vessel anatomy, origin well.  There is again mild calcifications evident into the proximal circumflex and also this obtuse marginal branch of the circumflex coronary artery. There appears to be progression of this obtuse marginal stenosis from previous 70% -80% to 80-90% now.  IMPRESSION:  Progression of obtuse marginal stenosis.  Single vessel coronary artery disease.  Extremely difficult to visualize anatomy in spite of 135 mL of contrast and multiple angulated views that were taken.  RECOMMENDATION:  Based on the anatomy and the difficulty of visualizing the complete vessel vasculature, I would recommend continuing medical therapy.  Fortunately there has not been any significant progression of coronary disease in rest of the vessels.  Hence  she can be safely be discharged home today with outpatient followup.  I have discussed the findings with the patient.  TECHNIQUE OF PROCEDURE:  Under sterile precautions using a 6-French right femoral arterial access, 6-French multipurpose B2 catheter was advanced into the ascending aorta, then into the left ventricle.  Left ventriculography was performed in the RAO projection.  Catheter pulled into the ascending aorta.  Right coronary selectively engaged and angiography was performed.  Then, the catheter pulled out of the body. A 5-French  Judkins left 4 diagnostic catheter was utilized to engage left main coronary artery and angiography was performed.  The catheter was then pulled out of body over a J-wire.  Right femoral arteriography was performed through the arterial access sheath and access was closed with Perclose with excellent hemostasis. The patient tolerated the procedure.  No immediate complication noted.     Pamella Pert, MD     JRG/MEDQ  D:  04/01/2011  T:  04/01/2011  Job:  [id number]  cc:   Tomi Bamberger, NP  Electronically Signed by Yates Decamp MD on 04/02/2011 08:03:43 PM

## 2011-04-02 NOTE — H&P (Signed)
NAMESUN, KIHN NO.:  [id number]  MEDICAL RECORD NO.:  [id number]  LOCATION:  2022                         FACILITY:  [facility]  PHYSICIAN:  Pamella Pert, MD DATE OF BIRTH:  10/23/1945  DATE OF ADMISSION:  03/31/2011 DATE OF DISCHARGE:                             HISTORY & PHYSICAL   REASON FOR ADMISSION:  Unstable angina.  HISTORY:  Ms. [name] is a pleasant 65 year old female with history of known coronary artery disease.  By cardiac catheterization, she was found to have a high-grade ramus intermedius stenosis of 80% on December 11, 2010.  She has been on aggressive medical therapy and had been doing well.  But, however, last evening, she started to have left arm pain with radiation up from the upper chest.  She took sublingual nitroglycerin, but it took a longer time than usual for it to be subsiding.  Hence, she presented to the emergency department.  When she presented to the emergency room, she was given sublingual nitroglycerin and aspirin.  Her chest pain had resolved.  She was then admitted for rule out protocol.  Presently, she is asymptomatic and denies any chest pain or any shortness of breath, except for baseline shortness of breath.  She has no PND or orthopnea.  No associated nausea, vomiting, or diaphoresis with the chest discomfort.  REVIEW OF SYSTEMS:  She denies any bowel or bladder disturbances. Denies any neurological weakness.  Denies recent weight gain or weight loss.  No hemoptysis.  No syncope.  No palpitations.  She does have peptic ulcer disease, but has not had any GI bleed.  She has hiatal hernia and irritable bowel syndrome.  Other systems are negative.  PAST MEDICAL HISTORY:  Significant for: 1. COPD. 2. History of pancreatitis in 2008. 3. Tubal ligation in 1979. 4. Chronic back pain and back surgery in 2011.  FAMILY HISTORY:  There is strong family history of premature coronary artery disease with father  dying with myocardial infarction at the age of 61.  Mother died at the age of 64 from myocardial infarction.  PHYSICAL EXAMINATION:  GENERAL:  She is moderately built and frail.  She appears to be in no acute distress. VITAL SIGNS:  Temperature of 97.9, pulse is 70 beats per minute, respiration 14, blood pressure 103/62 mmHg. CARDIAC:  S1, S2 is normal without any gallop or murmur. CHEST:  Clear. ABDOMEN:  Soft. EXTREMITIES:  No edema.  Peripheral arterial exam was normal.  Her EKG demonstrates underlying left sinus rhythm with left bundle- branch block.  LABORATORY DATA:  Her labs reveal hemoglobin of 12.9, hematocrit of 36.9, white count was 7.4 with a platelet count of 214,000. Electrolytes, BUN, and creatinine are within normal limits.  Blood sugar was slightly elevated at 115.  Troponin x2 and CPK x1 is negative for myocardial injury.  IMPRESSION: 1. Chest pain, suggestive of unstable angina. 2. Hyperlipidemia. 3. Chronic obstructive pulmonary disease due to continued tobacco use.  RECOMMENDATION:  I have discussed the risks, benefits, and alternatives to proceeding with cardiac catheterization versus continued medical therapy.  The patient states that she has been having recurrent chest discomfort, and she wants to proceed with coronary  angiography and possible angioplasty.  I have explained to her regarding the high-risk nature and the ostial nature of the ramus intermedius stenosis.  She understands less than a percent risk of death, stroke, heart attack and need for urgent bypass surgery with cardiac catheterization but not limited to these including bleeding, infection, renal failure.  The patient wishes to proceed.     Pamella Pert, MD     JRG/MEDQ  D:  04/01/2011  T:  04/01/2011  Job:  [id number]  Electronically Signed by Yates Decamp MD on 04/02/2011 08:03:26 PM

## 2011-04-02 NOTE — Discharge Summary (Signed)
[name], [name] NO.:  [id number]  MEDICAL RECORD NO.:  [id number]  LOCATION:  2022                         FACILITY:  [facility]  PHYSICIAN:  Pamella Pert, MD DATE OF BIRTH:  Oct 28, 1945  DATE OF ADMISSION:  03/31/2011 DATE OF DISCHARGE:  04/01/2011                              DISCHARGE SUMMARY   DISCHARGE DIAGNOSES: 1. Unstable angina. 2. Single-vessel coronary artery disease involving the high obtuse     marginal 1 branch of the circumflex coronary artery.  Stenosis has     progressed from 70-80% to 80-90%. 3. Chronic obstructive pulmonary disease. 4. Ongoing tobacco use. 5. Hypertension. 6. Hyperlipidemia. 7. Chronic back ache.  RECOMMENDATION:  The patient will be discharged home with the home medications without any change.  Her home discharge medications will include: 1. Provigil 200 mg p.o. b.i.d. 2. ProAir inhaler q.8 hours p.r.n. for shortness of breath and     bronchial asthma. 3. Pristiq 1 capsule 1 p.o. q.p.m. 4. Oxycodone/acetaminophen 5/325 q.6 hours p.r.n. for back ache. 5. Nitroglycerin spray, 1 spray q.5 minutes p.r.n. for chest pain and     angina pectoris. 6. Multivitamin 1 p.o. daily. 7. Metoprolol succinate 25 mg p.o. q.p.m. 8. Methocarbamol 5 mg p.o. b.i.d. for muscle spasm p.r.n. 9. Melatonin OTC 2 capsules q.p.m. 10.Gabapentin 100 mg 2-3 tablets q.p.m. 11.Doxepin 25 mg p.o. at bedtime. 12.Clonazepam 1 mg p.o. 1-2 tablets p.o. b.i.d. 13.Livalo 2 mg p.o. daily 14.Copaxone 20 mg injection subcu 1 mL daily. 15.Calcium carbonate OTC 1 p.o. daily. 16.Aspirin 325 mg p.o. daily. 17.Aciphex 20 mg p.o. daily.  BRIEF HISTORY:  [name] is a 65 year old female with history of known coronary artery disease.  She has known obtuse marginal 1 stenosis, which was recommended medical therapy by cardiac catheterizations that was done in December 11, 2010.  She presented with unstable angina last night with ongoing chest  discomfort.  This was relieved with nitroglycerin.  She was ruled out for myocardial infarction.  Because of progression of angina pectoris to unstable angina, she was now brought back to the cardiac catheterization lab to evaluate a coronary anatomy.  She underwent cardiac catheterization sedation via right femoral arterial access route and was found to have mild progression of coronary disease from 80% to 80-90% in the obtuse marginal 1 branch of the circumflex coronary artery.  It was felt to be not conducive for angioplasty.  This was because of difficulty in visualization of the vessel.  It is a moderate caliber vessel.  Hence, we felt that medical therapy would be more appropriate, hence the patient was recommended medical therapy and is felt to be stable for discharge as she has not had any further chest discomfort since hospital admission and she was ruled out for myocardial infarction.  PERTINENT FINDINGS:  An EKG which revealed underlying left bundle-branch block unchanged from prior EKG.  CBC and BMP were within normal limits.  Cardiac markers were negative for myocardial injury x2.  The patient will be discharged home today with outpatient follow up in 2 weeks.     Pamella Pert, MD     JRG/MEDQ  D:  04/01/2011  T:  04/01/2011  Job:  [id number]  cc:   Tomi Bamberger, NP  Electronically Signed by Yates Decamp MD on 04/02/2011 08:03:33 PM

## 2011-07-11 ENCOUNTER — Ambulatory Visit: Payer: Medicare Other | Admitting: Family Medicine

## 2011-07-11 DIAGNOSIS — Z0289 Encounter for other administrative examinations: Secondary | ICD-10-CM

## 2011-08-05 DIAGNOSIS — G894 Chronic pain syndrome: Secondary | ICD-10-CM | POA: Insufficient documentation

## 2011-10-02 ENCOUNTER — Other Ambulatory Visit (HOSPITAL_COMMUNITY): Payer: Self-pay | Admitting: *Deleted

## 2011-10-02 ENCOUNTER — Ambulatory Visit (INDEPENDENT_AMBULATORY_CARE_PROVIDER_SITE_OTHER): Payer: Medicare Other | Admitting: Psychiatry

## 2011-10-02 ENCOUNTER — Encounter (HOSPITAL_COMMUNITY): Payer: Self-pay | Admitting: Psychiatry

## 2011-10-02 VITALS — BP 122/70 | HR 82 | Resp 16 | Ht 61.0 in | Wt 116.2 lb

## 2011-10-02 DIAGNOSIS — F329 Major depressive disorder, single episode, unspecified: Secondary | ICD-10-CM

## 2011-10-02 DIAGNOSIS — F332 Major depressive disorder, recurrent severe without psychotic features: Secondary | ICD-10-CM

## 2011-10-02 MED ORDER — DESIPRAMINE HCL 25 MG PO TABS: 25.0000 mg | ORAL_TABLET | Freq: Every day | ORAL | Status: DC

## 2011-10-02 MED ORDER — DESIPRAMINE HCL 25 MG PO TABS: ORAL_TABLET | ORAL | Status: DC
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
## 2011-10-11 ENCOUNTER — Telehealth (HOSPITAL_COMMUNITY): Payer: Self-pay | Admitting: Psychology

## 2011-10-11 NOTE — Telephone Encounter (Signed)
See phone notes.

## 2011-10-17 ENCOUNTER — Telehealth (HOSPITAL_COMMUNITY): Payer: Self-pay | Admitting: *Deleted

## 2011-10-17 DIAGNOSIS — F329 Major depressive disorder, single episode, unspecified: Secondary | ICD-10-CM

## 2011-10-17 MED ORDER — CLONAZEPAM 1 MG PO TABS: 1.0000 mg | ORAL_TABLET | Freq: Every evening | ORAL | Status: DC | PRN

## 2011-10-17 MED ORDER — BUPROPION HCL ER (XL) 150 MG PO TB24: 150.0000 mg | ORAL_TABLET | Freq: Every day | ORAL | Status: DC

## 2011-10-17 NOTE — Telephone Encounter (Signed)
Contacted by Dr.Plovsky am of 10/17/11.Medications were called in by Dr.Plovsky evening of 10/16/11.Requested medications be entered into EPIC

## 2011-11-06 ENCOUNTER — Ambulatory Visit (INDEPENDENT_AMBULATORY_CARE_PROVIDER_SITE_OTHER): Payer: Medicare Other | Admitting: Psychiatry

## 2011-11-06 ENCOUNTER — Encounter (HOSPITAL_COMMUNITY): Payer: Self-pay | Admitting: Psychiatry

## 2011-11-06 VITALS — BP 125/78 | HR 65 | Wt 119.4 lb

## 2011-11-06 DIAGNOSIS — F329 Major depressive disorder, single episode, unspecified: Secondary | ICD-10-CM

## 2011-11-06 MED ORDER — BUPROPION HCL ER (XL) 150 MG PO TB24: 300.0000 mg | ORAL_TABLET | ORAL | Status: DC

## 2011-11-06 NOTE — Progress Notes (Signed)
[facility] MD Progress Note  11/06/2011 2:22 PM  Diagnosis:  Major depression mild Today this patient returns after she's had a paradoxical response to Cipro me in that it made her sleepy. One month ago therefore we discontinued it and began her on low-dose Wellbutrin 150 mg XL.  ) Evaluation it is not clear that this patient really has a major depression. Again she is on multiple psychotropic medications. She was begun on Pristiqe years ago. Now she is having trouble coming off of it. It is noted that she is reduced her Klonopin from 3 mg at night to 2 mg at night. She claims lowering it produces spasming in her feet. At this time she feels no benefits from the low dose of Wellbutrin and will go ahead and increase it. She does describe in the past that she's been on Ritalin from Dr. Lin Givens which was beneficial at the time but she developed tolerance. Today she brings up the possibility and requests Adderall which is reasonable. Her major complaints are persistent fatigue since being off the Provigil. She also pelvic cognitively she is much much worse. Unable to think and concentrate she cannot do normal activities or bring her pleasure previously. I will go ahead and increase her Wellbutrin and*come back in 7 weeks when in that time if she's not improved I would increase the Wellbutrin to the maximum dose and add a small dose of Adderall. This patient is no history of substance abuse. ADL's:    Sleep: Poor...  Appetite:  Good  Suicidal Ideation:none   Homicidal Ideation:none    AEB (as evidenced by):  Mental Status Examination/Evaluation: Objective:  Appearance: Neat  Eye Contact::  Good  Speech:  Normal Rate  Volume:  Normal  Mood:  Dysphoric  Affect:  Full Range  Thought Process:  Goal Directed  Orientation:  Full  Thought Content:  WDL  Suicidal Thoughts:  No  Homicidal Thoughts:  No  Memory:  Recent;   Good  Judgement:  Good  Insight:  Good  Psychomotor Activity:  Normal    Concentration:  Good  Recall:  Good  Akathisia:  No  Handed:  Right  AIMS (if indicated):   no  Assets:  Desire for Improvement  Sleep:   no   Vital Signs:There were no vitals taken for this visit. Current Medications: Current Outpatient Prescriptions  Medication Sig Dispense Refill  . albuterol (PROAIR HFA) 108 (90 BASE) MCG/ACT inhaler Inhale 2 puffs into the lungs every 6 (six) hours as needed.        [name] aspirin 325 MG tablet Take 325 mg by mouth daily.        [name] buPROPion (WELLBUTRIN XL) 150 MG 24 hr tablet Take 1 tablet (150 mg total) by mouth daily.  30 tablet  5  . clonazePAM (KLONOPIN) 1 MG tablet Take 1 tablet (1 mg total) by mouth at bedtime as needed for anxiety.  30 tablet  2  . desipramine (NORPRAMIN) 25 MG tablet Take 1 tablet (25 mg total) by mouth daily.  30 tablet  3  . desvenlafaxine (PRISTIQ) 50 MG 24 hr tablet Take 50 mg by mouth daily.        [name] doxepin (SINEQUAN) 25 MG capsule Take 25 mg by mouth at bedtime.        . gabapentin (NEURONTIN) 300 MG capsule Take 300 mg by mouth daily.        [name] glatiramer (COPAXONE) 20 MG/ML injection Inject 20 mg into the skin daily.        [name]  modafinil (PROVIGIL) 200 MG tablet Take 200 mg by mouth 2 (two) times daily.        [name] oxyCODONE-acetaminophen (PERCOCET) 5-325 MG per tablet Take 1 tablet by mouth every 6 (six) hours as needed.          Lab Results: No results found for this or any previous visit (from the past 48 hour(s)).  Physical Findings: AIMS:  , ,  ,  ,    CIWA:    COWS:     Treatment Plan Summary:   Plan: At this time we will go ahead and increase her Wellbutrin to 150 mg XL 2 every morning. When she returns in 6-7 weeks if she's not better will increase it to the maximum dose and add a small dose of Adderall. Vivica Dobosz IRVING 11/06/2011, 2:22 PM

## 2011-11-22 ENCOUNTER — Telehealth (HOSPITAL_COMMUNITY): Payer: Self-pay | Admitting: Psychology

## 2011-11-22 ENCOUNTER — Other Ambulatory Visit (HOSPITAL_COMMUNITY): Payer: Self-pay | Admitting: Psychology

## 2011-11-22 MED ORDER — DOXEPIN HCL 25 MG PO CAPS: 25.0000 mg | ORAL_CAPSULE | Freq: Every day | ORAL | Status: DC

## 2011-11-22 NOTE — Telephone Encounter (Signed)
See phone visit notes.  

## 2011-11-25 ENCOUNTER — Telehealth (HOSPITAL_COMMUNITY): Payer: Self-pay | Admitting: *Deleted

## 2011-11-25 DIAGNOSIS — F329 Major depressive disorder, single episode, unspecified: Secondary | ICD-10-CM

## 2011-11-25 MED ORDER — CLONAZEPAM 1 MG PO TABS: 2.0000 mg | ORAL_TABLET | Freq: Every evening | ORAL | Status: DC | PRN

## 2011-11-25 NOTE — Telephone Encounter (Signed)
Contacted pt. She states the Wellbutrin did not really help at all. Was in the bed all the time, even going back to bed after taking it in the morning. She has stopped taking it.  After reading and talking to others in [place] forum, she is interested in trying Abdera or Amantadine to help with cognition and energy-- and wants to know what is recommended by Dr. Donell Beers

## 2011-11-25 NOTE — Telephone Encounter (Signed)
Order Per Dr.Plovsky:Klonopin 1 mg, take 2 at HS, #60 with 3 refills.

## 2011-11-25 NOTE — Telephone Encounter (Signed)
Left VM expressing concern r/t medicine. Requested call back.

## 2011-11-27 ENCOUNTER — Other Ambulatory Visit (HOSPITAL_COMMUNITY): Payer: Self-pay | Admitting: *Deleted

## 2011-11-27 DIAGNOSIS — F329 Major depressive disorder, single episode, unspecified: Secondary | ICD-10-CM

## 2011-11-27 MED ORDER — AMANTADINE HCL 100 MG PO CAPS: 100.0000 mg | ORAL_CAPSULE | Freq: Two times a day (BID) | ORAL | Status: DC

## 2011-11-27 NOTE — Telephone Encounter (Signed)
Amantadine ordered by Dr.Plovsky. Patient contacted with information regarding RX and instructions.

## 2011-11-28 ENCOUNTER — Ambulatory Visit (HOSPITAL_COMMUNITY): Payer: Medicare Other | Admitting: Physician Assistant

## 2011-12-02 ENCOUNTER — Telehealth (HOSPITAL_COMMUNITY): Payer: Self-pay | Admitting: *Deleted

## 2011-12-02 NOTE — Telephone Encounter (Signed)
Left VM: Started Amantadine last week. Now feeling depressed, can't we well to read or use the computer. Also hearing people talking (they aren't there and she knows it) in muffled tones. Please recommend something else

## 2011-12-04 MED ORDER — AMPHETAMINE-DEXTROAMPHET ER 10 MG PO CP24: ORAL_CAPSULE | ORAL | Status: DC

## 2011-12-04 NOTE — Addendum Note (Signed)
Addended by: Archer Asa on: 12/04/2011 02:35 PM   Modules accepted: Orders

## 2011-12-20 ENCOUNTER — Ambulatory Visit (HOSPITAL_COMMUNITY): Payer: Self-pay | Admitting: Psychiatry

## 2012-01-01 ENCOUNTER — Encounter (HOSPITAL_COMMUNITY): Payer: Self-pay | Admitting: Psychiatry

## 2012-01-01 ENCOUNTER — Ambulatory Visit (INDEPENDENT_AMBULATORY_CARE_PROVIDER_SITE_OTHER): Payer: Medicare Other | Admitting: Psychiatry

## 2012-01-01 VITALS — BP 120/80 | HR 60 | Resp 10 | Wt 114.0 lb

## 2012-01-01 DIAGNOSIS — F325 Major depressive disorder, single episode, in full remission: Secondary | ICD-10-CM

## 2012-01-01 DIAGNOSIS — F329 Major depressive disorder, single episode, unspecified: Secondary | ICD-10-CM

## 2012-01-01 MED ORDER — AMPHETAMINE-DEXTROAMPHET ER 10 MG PO CP24: ORAL_CAPSULE | ORAL | Status: DC

## 2012-01-01 NOTE — Progress Notes (Signed)
[facility] MD Progress Note  01/01/2012 2:53 PMtoday the patient appeared on time. The patient is doing distinctly better. This is after starting on Adderall and titrating up to 10 mg 2 a day. Since being on this medicine her energy is much better and her concentration is better. She is much more motivated and tomorrow will start working out at [employer]. She is back to riding a bike. At this time all depression symptoms have abated. Her mood is great she is sleeping and eating well and has good energy. She claims her concentration is improved. The patient is back to sewing gardening and crocheting. She does acknowledge that taking Ritalin 20 mg at one time is better than splitting it up.  Interview of her history of MS she appear which she lost the vision in her left eye which is now for the most part return. She does have some difficulty seeing in the lower and upper fields of her left eye. The patient in the past has had double vision and has received IV steroids on a number of times with exacerbations. She presently is under the care of Dr. Lin Givens but plans to move her neurological care to [facility]. This patient clearly seems to benefit from the combination of burst take, Klonopin 1 mg twice a day and some doxepin at night. Diagnosis:  Axis I: Major Depression, single episode  ADL's:  Intact  Sleep: Good  Appetite:  Good  Suicidal Ideation: no  Homicidal Ideation: no   AEB (as evidenced by):  Mental Status Examination/Evaluation: Objective:  Appearance: Well Groomed  Eye Contact::  Good  Speech:  Normal Rate  Volume:  Normal  Mood:  Euthymic  Affect:  Full Range  Thought Process:  Logical  Orientation:  Full  Thought Content:  WDL  Suicidal Thoughts:  No  Homicidal Thoughts:  No  Memory:  good  Judgement:  Good  Insight:  Good  Psychomotor Activity:  Normal  Concentration:  Good  Recall:  Good  Akathisia:  No  Handed:  Right  AIMS (if indicated):     Assets:  Communication  Skills  Sleep:      Vital Signs:There were no vitals taken for this visit. Current Medications: Current Outpatient Prescriptions  Medication Sig Dispense Refill  . albuterol (PROAIR HFA) 108 (90 BASE) MCG/ACT inhaler Inhale 2 puffs into the lungs every 6 (six) hours as needed.        [name] amantadine (SYMMETREL) 100 MG capsule Take 1 capsule (100 mg total) by mouth 2 (two) times daily.  60 capsule  5  . amphetamine-dextroamphetamine (ADDERALL XR) 10 MG 24 hr capsule 2 qam  1 qnoon  90 capsule  0  . aspirin 325 MG tablet Take 325 mg by mouth daily.        [name] buPROPion (WELLBUTRIN XL) 150 MG 24 hr tablet Take 2 tablets (300 mg total) by mouth every morning.  60 tablet  5  . clonazePAM (KLONOPIN) 1 MG tablet Take 2 tablets (2 mg total) by mouth at bedtime as needed.  60 tablet  3  . desipramine (NORPRAMIN) 25 MG tablet Take 1 tablet (25 mg total) by mouth daily.  30 tablet  3  . desvenlafaxine (PRISTIQ) 50 MG 24 hr tablet Take 50 mg by mouth daily.        [name] doxepin (SINEQUAN) 25 MG capsule Take 1 capsule (25 mg total) by mouth at bedtime.  30 capsule  1  . gabapentin (NEURONTIN) 300 MG capsule Take  300 mg by mouth daily.        [name] glatiramer (COPAXONE) 20 MG/ML injection Inject 20 mg into the skin daily.        [name] oxyCODONE-acetaminophen (PERCOCET) 5-325 MG per tablet Take 1 tablet by mouth every 6 (six) hours as needed.        [name] DISCONTD: amphetamine-dextroamphetamine (ADDERALL XR) 10 MG 24 hr capsule 1 qam for 5 days then 1 qam 1 qnoon  60 capsule  0  . DISCONTD: amphetamine-dextroamphetamine (ADDERALL XR) 10 MG 24 hr capsule 2 qam  1 qnoon  90 capsule  0  . DISCONTD: amphetamine-dextroamphetamine (ADDERALL XR) 10 MG 24 hr capsule 2 qam  1 qnoon  90 capsule  0    Lab Results: No results found for this or any previous visit (from the past 48 hour(s)).  Physical Findings: AIMS:  , ,  ,  ,    CIWA:    COWS:     Treatment Plan Summary:at this time will go ahead and increase her Adderall xr to what  I believethe maximum dose. She will receive Adderall 10 mg XR 2 in the morning and one later in the day. She'll continue all other medications and return to see me in 3 months.Medication management  Plan:  Lucas Mallow 01/01/2012, 2:53 PM

## 2012-01-03 ENCOUNTER — Telehealth: Payer: Self-pay | Admitting: Internal Medicine

## 2012-01-03 DIAGNOSIS — R911 Solitary pulmonary nodule: Secondary | ICD-10-CM

## 2012-01-03 NOTE — Telephone Encounter (Signed)
The order that was placed by MR in Oct 2012 was to repeat CT Chest in 6 months, which would have been April 2013. This order has Expired-Auto Closed. Referrals are only good for 6 months.  Please send new order to [facility] for CT Chest. Thanks,. Rhonda J Cobb

## 2012-01-03 NOTE — Telephone Encounter (Signed)
Order placed. Quetzali Heinle, CMA  

## 2012-01-03 NOTE — Telephone Encounter (Signed)
Order has already been placed in 03/2011. Please advise PCC, thanks

## 2012-01-10 ENCOUNTER — Ambulatory Visit (INDEPENDENT_AMBULATORY_CARE_PROVIDER_SITE_OTHER)
Admission: RE | Admit: 2012-01-10 | Discharge: 2012-01-10 | Disposition: A | Discharge status: Home / Self Care | Payer: Medicare Other | Source: Ambulatory Visit | Attending: Internal Medicine | Admitting: Internal Medicine

## 2012-01-10 DIAGNOSIS — R911 Solitary pulmonary nodule: Secondary | ICD-10-CM

## 2012-02-20 ENCOUNTER — Encounter: Payer: Self-pay | Admitting: Internal Medicine

## 2012-02-20 ENCOUNTER — Ambulatory Visit (INDEPENDENT_AMBULATORY_CARE_PROVIDER_SITE_OTHER): Payer: Medicare Other | Admitting: Internal Medicine

## 2012-02-20 VITALS — BP 120/82 | HR 70 | Temp 98.0°F | Ht 61.0 in | Wt 114.6 lb

## 2012-02-20 DIAGNOSIS — R0609 Other forms of dyspnea: Secondary | ICD-10-CM

## 2012-02-20 DIAGNOSIS — F17200 Nicotine dependence, unspecified, uncomplicated: Secondary | ICD-10-CM

## 2012-02-20 DIAGNOSIS — R911 Solitary pulmonary nodule: Secondary | ICD-10-CM

## 2012-02-20 DIAGNOSIS — Z23 Encounter for immunization: Secondary | ICD-10-CM

## 2012-02-20 DIAGNOSIS — R0600 Dyspnea, unspecified: Secondary | ICD-10-CM

## 2012-02-20 NOTE — Progress Notes (Signed)
Subjective:    Patient ID: [name], female    DOB: [birth year], 66 y.o.   MRN: [id number]  HPI  66 year old smoker, DJD, OA, Fibromyalgia, Osteoporosis, Hiatal Hernia., GERD, Thyroid nodules and on chronic narcs for chronic back pain, hix of skin cancers  Referred for LUL 8 mm apical-medial nodule by Dr. Shirline Frees. First had MRI mid-dec 2011 and noted to have LUL nodule. Followed by CT chest that confirmed same. Fu CT chest 3 months later in March showed no change (independently reviewed). She currently feels at baseline health with chronic pain and baseline dyspnea. States dyspnea is chronic, relieved by pro-air, brought on by treadmill exertion, present for few years, worse past few months, class 2 severity. Also, reports associated associated "burning all over lungs" that comes on treadmill but goes away after continued 10 minute of exertion. Associated chronic cough with clear mild mucoid sputum with occassional yellow sputum. REcollects cath 2002; had RCA spasm per records  She is not interested in quitting smoking, visiting MDs unless she has to, seeing cards for chest pain. Does not want me to be more aggressive with followup and workup of dyspnea  REC Have repeat ct chest in 6 months  Have simple spirometry at followup for workup of your shortness of breath  Please quit smoking   OV 03/14/11 Followup  a) LUL Nodule - apical, posterior segment, subpleural first noted dec 2011 B) Smoking: C) chronic class 2 exertional dyspnea for which she has refused workup (known to have CAD)  She has had CT chest 03/08/11: LUL nodule is unchnaged from dec 2011. No respiratory issues. Still smoking but changed to electronic cigs and working by self to quit. Exertional class 2 dyspnea persists unchanged. Recent cath shows ef 45%. Spirometry today is normal - fev1 1.68L/83%. Overall mood is low (see below)  Past Med hx:  1. Had cath July 2012: ef 45%, angina. On med mgmt with Dr Shon Baton. Emotional  distress + (causes chest pain - see below) 2. C/o slow melena due to old polyps 3. No hx of having mammograms and pap smears     REC #smoking  - glad you are working on quitting #Shortness of breath   - I think this is related to heart #Lung nodule - have ct chest without contrast in 6 months and followup then  - 6 months with ct chest   OV 02/20/2012  #Shortness of Breath: STable. Mild. Brought on by exertion but only with extremes. Walking dog for 1 mile x 2 times per day does not bother her.  YArd work does not cause dyspnea. However, weather changes can make her mildly dyspneic. This is improved with albuterol prn. CT shows mild centrilobular emphysema. Has not had but willing to have flu shot today 02/20/2012   #Smoking  - quit conventional cigs. On e-cigs since Aug 2012; without tar or nicotine. The smoker's cough is resolved. She is unaware that risks of smoking still exist; had to counsel her  #Lung nodule - see detailed report below. No change in 1.65mc nodule LUL subpleural nodule posteriorly dec 2011 to aug 2013   CT chest 01/10/12 IMPRESSION:  1. 1.3 x 0.8 cm subpleural nodule in the posterior aspect of the  left upper lobe is unchanged compared to remote prior study  05/17/2010. At this time, we have established 20 months of  stability, strongly suggestive of a benign lesion. Strictly  speaking, 24 months of stability is typically required to establish  a  lesion as definitively benign. If desired, repeat CT of the  thorax in 1 year could be obtained to establish greater than 24  months of stability in this patient with mild smoking related  changes in the lungs.  2. Mild centrilobular emphysema and mild diffuse bronchial wall  thickening.  3. Atherosclerosis, including left main and two-vessel coronary  artery disease. Assessment for potential risk factor modification,  dietary therapy or pharmacologic therapy may be warranted, if  clinically indicated.  Original  Report Authenticated By: Florencia Reasons, M.D.    Past, Family, Social reviewed: Moved on from grief of losing dog. She has new dog.  Neighbor and close friend who was dx with metastatic cancer has died.      Review of Systems  Constitutional: Negative for fever, chills, diaphoresis, activity change, appetite change, fatigue and unexpected weight change.  HENT: Negative for hearing loss, ear pain, nosebleeds, congestion, sore throat, facial swelling, rhinorrhea, sneezing, mouth sores, trouble swallowing, neck pain, neck stiffness, dental problem, voice change, postnasal drip, sinus pressure, tinnitus and ear discharge.   Eyes: Negative for visual disturbance.  Respiratory: Positive for shortness of breath. Negative for cough, choking, chest tightness and wheezing.   Cardiovascular: Negative for chest pain, palpitations and leg swelling.  Gastrointestinal: Negative for nausea, vomiting, abdominal pain, constipation and blood in stool.  Genitourinary: Negative for difficulty urinating.  Musculoskeletal: Negative for myalgias, back pain, joint swelling, arthralgias and gait problem.  Skin: Negative for color change, pallor and rash.  Neurological: Negative for dizziness, tremors, seizures, syncope, speech difficulty, weakness, light-headedness, numbness and headaches.  Hematological: Does not bruise/bleed easily.  Psychiatric/Behavioral: Negative for confusion, disturbed wake/sleep cycle and agitation. The patient is not nervous/anxious.        Objective:   Physical Exam  Vitals reviewed. Constitutional: She is oriented to person, place, and time. She appears well-developed and well-nourished. No distress.  HENT:  Head: Normocephalic and atraumatic.  Right Ear: External ear normal.  Left Ear: External ear normal.  Mouth/Throat: Oropharynx is clear and moist. No oropharyngeal exudate.  Eyes: Conjunctivae normal and EOM are normal. Pupils are equal, round, and reactive to light. Right  eye exhibits no discharge. Left eye exhibits no discharge. No scleral icterus.  Neck: Normal range of motion. Neck supple. No JVD present. No tracheal deviation present. No thyromegaly present.  Cardiovascular: Normal rate, regular rhythm, normal heart sounds and intact distal pulses.  Exam reveals no gallop and no friction rub.   No murmur heard. Pulmonary/Chest: Effort normal and breath sounds normal. No respiratory distress. She has no wheezes. She has no rales. She exhibits no tenderness.  Abdominal: Soft. Bowel sounds are normal. She exhibits no distension and no mass. There is no tenderness. There is no rebound and no guarding.  Musculoskeletal: Normal range of motion. She exhibits no edema and no tenderness.  Lymphadenopathy:    She has no cervical adenopathy.  Neurological: She is alert and oriented to person, place, and time. She has normal reflexes. No cranial nerve deficit. She exhibits normal muscle tone. Coordination normal.  Skin: Skin is warm and dry. No rash noted. She is not diaphoretic. No erythema. No pallor.  Psychiatric: She has a normal mood and affect. Her behavior is normal. Judgment and thought content normal.          Assessment & Plan:

## 2012-02-20 NOTE — Patient Instructions (Addendum)
#  smoking  - glad you quit conventional cigarette - keep in mind that while e-cigs have benefits like getting rid of smoker cough and smell, you still at risk for worsening heart issues, cancer, copd at future  #Shortness of breath   - I think this is related to heart and mild emphysema on scan  - continue albuterol as needed and daily exercises  #Lung nodule - have ct chest without contrast in 12 months and followup then  #ROV  - 12 months with ct chest - flu shot today 02/20/2012

## 2012-02-21 NOTE — Assessment & Plan Note (Signed)
#  smoking  - glad you quit conventional cigarette - keep in mind that while e-cigs have benefits like getting rid of smoker cough and smell, you still at risk for worsening heart issues, cancer, copd at future     5 min spent on cousneling. Noticed lot of denial. She needed apreciateion of her transition

## 2012-02-21 NOTE — Assessment & Plan Note (Signed)
Nodule stablee Dec 2011 to Aug 2013  Plan Repeat ct chest 1 year - sept 2014

## 2012-02-21 NOTE — Assessment & Plan Note (Signed)
Spriometry Oct 2012 - normal. Cath July 2012: ef 45%. CT mild emphysema. Dyspnea likely cardiac in nature. But warned that continued smoking could make empysema worse and dyspnea progressive over years. She verbalized understanding. FLu shot today

## 2012-03-30 ENCOUNTER — Other Ambulatory Visit (HOSPITAL_COMMUNITY): Payer: Self-pay | Admitting: *Deleted

## 2012-03-30 DIAGNOSIS — F329 Major depressive disorder, single episode, unspecified: Secondary | ICD-10-CM

## 2012-03-30 MED ORDER — DOXEPIN HCL 25 MG PO CAPS: 25.0000 mg | ORAL_CAPSULE | Freq: Every day | ORAL | Status: DC

## 2012-04-03 ENCOUNTER — Ambulatory Visit (INDEPENDENT_AMBULATORY_CARE_PROVIDER_SITE_OTHER): Payer: Medicare Other | Admitting: Psychiatry

## 2012-04-03 ENCOUNTER — Encounter (HOSPITAL_COMMUNITY): Payer: Self-pay | Admitting: Psychiatry

## 2012-04-03 VITALS — BP 120/82 | HR 72 | Wt 112.0 lb

## 2012-04-03 DIAGNOSIS — F325 Major depressive disorder, single episode, in full remission: Secondary | ICD-10-CM

## 2012-04-03 MED ORDER — AMPHETAMINE-DEXTROAMPHET ER 10 MG PO CP24: ORAL_CAPSULE | ORAL | Status: DC

## 2012-04-03 NOTE — Progress Notes (Signed)
[facility] MD Progress Note  04/03/2012 10:10 AM JACKLIN WAREING  MRN:  [id number]  Diagnosis:  Axis I: Major Depression, Recurrent severe Today this patient is doing quite well. She could not tolerate the Wellbutrin and therefore was started on Adderall 10 mg 3 times a day. She's been taking it now for over 2 months and is actually doing better. Her energy level is improved. She takes together with Pristeque and some doxepin at night. Patient is very active. She's unfortunately bumped her shoulder and her foot and injured them somewhat but she still very very active he is going back to the [facility] for swimming. She has a great deal of yard work. She likes to clean she watches TV doesn't read too much. Overall the patient is doing better. ADL's:  Intact  Sleep: Good  Appetite:  Good  Suicidal Ideation:  no Homicidal Ideation:  no  AEB (as evidenced by):  Mental Status Examination/Evaluation: Objective:  Appearance: Fairly Groomed  Eye Contact::  Good  Speech:  Clear and Coherent  Volume:  Normal  Mood:  Euthymic  Affect:  Appropriate  Thought Process:  Coherent  Orientation:  Full  Thought Content:  WDL  Suicidal Thoughts:  No  Homicidal Thoughts:  No  Memory:  good  Judgement:  Good  Insight:  Good  Psychomotor Activity:  Normal  Concentration:  Good  Recall:  Good  Akathisia:  No  Handed:  Right  AIMS (if indicated):     Assets:  Desire for Improvement  Sleep:      Vital Signs:There were no vitals taken for this visit. Current Medications: Current Outpatient Prescriptions  Medication Sig Dispense Refill  . albuterol (PROAIR HFA) 108 (90 BASE) MCG/ACT inhaler Inhale 2 puffs into the lungs every 6 (six) hours as needed.        [name] amphetamine-dextroamphetamine (ADDERALL XR) 10 MG 24 hr capsule 2 qam  1 qnoon  90 capsule  0  . amphetamine-dextroamphetamine (ADDERALL XR) 10 MG 24 hr capsule 2 qam  1 qnoon  90 capsule  0  . amphetamine-dextroamphetamine (ADDERALL XR) 10 MG 24 hr  capsule 2 qam  1 qnoon  90 capsule  0  . amphetamine-dextroamphetamine (ADDERALL XR) 10 MG 24 hr capsule 2 qam  1 qnoon  90 capsule  0  . amphetamine-dextroamphetamine (ADDERALL XR) 10 MG 24 hr capsule 2 qam  1 qnoon  90 capsule  0  . aspirin 325 MG tablet Take 325 mg by mouth daily.        . clonazePAM (KLONOPIN) 1 MG tablet Take 2 tablets (2 mg total) by mouth at bedtime as needed.  60 tablet  3  . desvenlafaxine (PRISTIQ) 50 MG 24 hr tablet Take 50 mg by mouth daily.        [name] doxepin (SINEQUAN) 25 MG capsule Take 1 capsule (25 mg total) by mouth at bedtime.  30 capsule  1  . gabapentin (NEURONTIN) 300 MG capsule Take 300 mg by mouth daily.        [name] glatiramer (COPAXONE) 20 MG/ML injection Inject 20 mg into the skin daily.        [name] oxyCODONE-acetaminophen (PERCOCET) 5-325 MG per tablet Take 1 tablet by mouth every 6 (six) hours as needed.        [name] DISCONTD: amphetamine-dextroamphetamine (ADDERALL XR) 10 MG 24 hr capsule 2 qam  1 qnoon  90 capsule  0    Lab Results: No results found for this or any previous visit (from  the past 48 hour(s)).  Physical Findings: AIMS:  , ,  ,  ,    CIWA:    COWS:     Treatment Plan Summary:   Plan:we will continue her on Adderall 10 mg 3 times a day,Pristeque 50 mg and doxepin 25 mg. Her MS seems to be in control for now. This patient return to see me in 4 months.  Crit Obremski IRVING 04/03/2012, 10:10 AM

## 2012-04-27 ENCOUNTER — Other Ambulatory Visit (HOSPITAL_COMMUNITY): Payer: Self-pay | Admitting: *Deleted

## 2012-04-27 DIAGNOSIS — F329 Major depressive disorder, single episode, unspecified: Secondary | ICD-10-CM

## 2012-04-27 MED ORDER — CLONAZEPAM 1 MG PO TABS: 2.0000 mg | ORAL_TABLET | Freq: Every evening | ORAL | Status: DC | PRN

## 2012-05-25 ENCOUNTER — Other Ambulatory Visit (HOSPITAL_COMMUNITY): Payer: Self-pay | Admitting: *Deleted

## 2012-05-25 DIAGNOSIS — F329 Major depressive disorder, single episode, unspecified: Secondary | ICD-10-CM

## 2012-05-25 MED ORDER — DOXEPIN HCL 25 MG PO CAPS: 25.0000 mg | ORAL_CAPSULE | Freq: Every day | ORAL | Status: DC

## 2012-06-22 ENCOUNTER — Other Ambulatory Visit (HOSPITAL_COMMUNITY): Payer: Self-pay | Admitting: *Deleted

## 2012-06-22 DIAGNOSIS — F3342 Major depressive disorder, recurrent, in full remission: Secondary | ICD-10-CM

## 2012-06-22 MED ORDER — AMPHETAMINE-DEXTROAMPHET ER 10 MG PO CP24: ORAL_CAPSULE | ORAL | Status: DC

## 2012-06-26 ENCOUNTER — Telehealth (HOSPITAL_COMMUNITY): Payer: Self-pay

## 2012-06-26 NOTE — Telephone Encounter (Signed)
9:49AM 06/26/12 THE PT CAME AND PICK-UP RX SCRIPT./SH

## 2012-07-29 ENCOUNTER — Ambulatory Visit (INDEPENDENT_AMBULATORY_CARE_PROVIDER_SITE_OTHER): Payer: Medicare Other | Admitting: Psychiatry

## 2012-07-29 DIAGNOSIS — F909 Attention-deficit hyperactivity disorder, unspecified type: Secondary | ICD-10-CM

## 2012-07-29 DIAGNOSIS — F988 Other specified behavioral and emotional disorders with onset usually occurring in childhood and adolescence: Secondary | ICD-10-CM | POA: Insufficient documentation

## 2012-07-29 MED ORDER — DOXEPIN HCL 25 MG PO CAPS: 25.0000 mg | ORAL_CAPSULE | Freq: Every day | ORAL | Status: DC

## 2012-07-29 MED ORDER — AMPHETAMINE-DEXTROAMPHET ER 20 MG PO CP24: ORAL_CAPSULE | ORAL | Status: DC

## 2012-07-29 MED ORDER — DESVENLAFAXINE SUCCINATE ER 50 MG PO TB24: 50.0000 mg | ORAL_TABLET | Freq: Every day | ORAL | Status: DC

## 2012-07-29 NOTE — Addendum Note (Signed)
Addended by: Archer Asa on: 07/29/2012 03:38 PM   Modules accepted: Orders
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
## 2012-08-25 ENCOUNTER — Other Ambulatory Visit (HOSPITAL_COMMUNITY): Payer: Self-pay | Admitting: *Deleted

## 2012-08-25 DIAGNOSIS — F329 Major depressive disorder, single episode, unspecified: Secondary | ICD-10-CM

## 2012-08-25 MED ORDER — CLONAZEPAM 1 MG PO TABS: 2.0000 mg | ORAL_TABLET | Freq: Every evening | ORAL | Status: DC | PRN

## 2012-09-23 ENCOUNTER — Ambulatory Visit (INDEPENDENT_AMBULATORY_CARE_PROVIDER_SITE_OTHER): Payer: Medicare Other | Admitting: Psychiatry

## 2012-09-23 VITALS — BP 114/82 | HR 78 | Wt 118.2 lb

## 2012-09-23 DIAGNOSIS — F3342 Major depressive disorder, recurrent, in full remission: Secondary | ICD-10-CM

## 2012-09-23 DIAGNOSIS — F909 Attention-deficit hyperactivity disorder, unspecified type: Secondary | ICD-10-CM | POA: Insufficient documentation

## 2012-09-23 MED ORDER — AMPHETAMINE-DEXTROAMPHET ER 20 MG PO CP24: ORAL_CAPSULE | ORAL | Status: DC

## 2012-09-23 MED ORDER — AMPHETAMINE-DEXTROAMPHET ER 20 MG PO CP24: 40.0000 mg | ORAL_CAPSULE | ORAL | Status: DC

## 2012-09-23 NOTE — Progress Notes (Signed)
[facility] MD Progress Note  09/23/2012 2:20 PM [name]  MRN:  [id number] Subjective:  Good mood Diagnosis:  Axis I: ADHD, inattentive type Today the patient is doing great. She is full of energy. The increase in Adderall is helpful. Is not completely clear but I believe the dose was adjusted and now she's taking 20 mg 2 in the morning. She says it's deftly helpful. Her mood is good. Patient denies any physical complaints. She is more focused nor organized. She denies depression. She denies anxiety. The patient is sleeping and eating well. At this time the patient is not experiencing any MS symptoms. The patient is very socially active as well dressed and well-groomed. Patient has a close relationship with her family and seems to have a good attitude about her future. ADL's:  Intact  Sleep: Good  Appetite:  Good  Suicidal Ideation:  no Homicidal Ideation:  none AEB (as evidenced by):  Psychiatric Specialty Exam: ROS  Blood pressure 114/82, pulse 78, weight 118 lb 3.2 oz (53.615 kg).Body mass index is 22.35 kg/(m^2).  General Appearance: Casual  Eye Contact:: good  Speech:  Clear and Coherent  Volume:  Normal  Mood  normal  Affect:  Full Range  Thought Process:  Goal Directed  Orientation:  Full (Time, Place, and Person)  Thought Content:  WDL  Suicidal Thoughts:  No  Homicidal Thoughts:  No  Memory:  mnl  Judgement:  Good  Insight:  Good  Psychomotor Activity:  Normal  Concentration:  Good  Recall:  Fair  Akathisia:  No  Handed:  Right  AIMS (if indicated):     Assets:  Desire for Improvement  Sleep:      Current Medications: Current Outpatient Prescriptions  Medication Sig Dispense Refill  . albuterol (PROAIR HFA) 108 (90 BASE) MCG/ACT inhaler Inhale 2 puffs into the lungs every 6 (six) hours as needed.        [name] amphetamine-dextroamphetamine (ADDERALL XR) 20 MG 24 hr capsule Take 2 capsules daily  60 capsule  0  . amphetamine-dextroamphetamine (ADDERALL XR) 20 MG 24  hr capsule Take 2 capsules (40 mg total) by mouth every morning.  60 capsule  0  . aspirin 325 MG tablet Take 325 mg by mouth daily.        . clonazePAM (KLONOPIN) 1 MG tablet Take 2 tablets (2 mg total) by mouth at bedtime as needed.  60 tablet  3  . desvenlafaxine (PRISTIQ) 50 MG 24 hr tablet Take 1 tablet (50 mg total) by mouth daily.  30 tablet  10  . doxepin (SINEQUAN) 25 MG capsule Take 1 capsule (25 mg total) by mouth at bedtime.  30 capsule  10  . gabapentin (NEURONTIN) 300 MG capsule Take 300 mg by mouth daily.        [name] glatiramer (COPAXONE) 20 MG/ML injection Inject 20 mg into the skin daily.        [name] oxyCODONE-acetaminophen (PERCOCET) 5-325 MG per tablet Take 1 tablet by mouth every 6 (six) hours as needed.         No current facility-administered medications for this visit.    Lab Results: No results found for this or any previous visit (from the past 48 hour(s)).  Physical Findings: AIMS:  , ,  ,  ,    CIWA:    COWS:     Treatment Plan Summary: At this time the patient will continue taking Adderall 20 mg 2 in the morning. The patient is doing very  well. She will see me again in 2 months.  Plan:  Medical Decision Making Problem Points:  Established problem, stable/improving (1) Data Points:  Review of new medications or change in dosage (2)  I certify that inpatient services furnished can reasonably be expected to improve the patient's condition.   [name] 09/23/2012, 2:20 PM

## 2012-11-10 ENCOUNTER — Other Ambulatory Visit: Payer: Self-pay | Admitting: Family Medicine

## 2012-11-10 DIAGNOSIS — Z780 Asymptomatic menopausal state: Secondary | ICD-10-CM

## 2012-11-10 DIAGNOSIS — I10 Essential (primary) hypertension: Secondary | ICD-10-CM | POA: Insufficient documentation

## 2012-11-10 DIAGNOSIS — Z1231 Encounter for screening mammogram for malignant neoplasm of breast: Secondary | ICD-10-CM

## 2012-11-18 ENCOUNTER — Ambulatory Visit (INDEPENDENT_AMBULATORY_CARE_PROVIDER_SITE_OTHER): Payer: Medicare Other | Admitting: Psychiatry

## 2012-11-18 DIAGNOSIS — F909 Attention-deficit hyperactivity disorder, unspecified type: Secondary | ICD-10-CM

## 2012-11-18 DIAGNOSIS — F3342 Major depressive disorder, recurrent, in full remission: Secondary | ICD-10-CM

## 2012-11-18 MED ORDER — AMPHETAMINE-DEXTROAMPHET ER 20 MG PO CP24: ORAL_CAPSULE | ORAL | Status: DC

## 2012-11-18 NOTE — Progress Notes (Signed)
[facility] MD Progress Note  11/18/2012 2:17 PM [name]  MRN:  [id number] Subjective:  Feels great Diagnosis:  Axis I: ADHD, hyperactive type Today the patient is seen on time and is doing great. She is very energetic. She denies depression. She is sleeping and eating well. She has good energy. She doing a great deal of yard work and fixing things around her house which she really enjoys. Her thinking and concentration is good. The patient does not drink any alcohol. The patient's multiple sclerosis is stable at this time. The patient feels right and focused. She claims the Adderall is extremely helpful. ADL's:  Intact  Sleep: Good  Appetite:  Good  Suicidal Ideation:  no Homicidal Ideation:  no AEB (as evidenced by):  Psychiatric Specialty Exam: ROS  There were no vitals taken for this visit.There is no weight on file to calculate BMI.  General Appearance: Casual  Eye Contact::  Good  Speech:  Clear and Coherent  Volume:  Normal  Mood:  Euthymic  Affect:  Appropriate  Thought Process:  Coherent  Orientation:  Full (Time, Place, and Person)  Thought Content:  WDL  Suicidal Thoughts:  No  Homicidal Thoughts:  No  Memory:  NA  Judgement:  Good  Insight:  Good  Psychomotor Activity:  Normal  Concentration:  Good  Recall:  Good  Akathisia:  No  Handed:  Right  AIMS (if indicated):     Assets:  Desire for Improvement  Sleep:      Current Medications: Current Outpatient Prescriptions  Medication Sig Dispense Refill  . albuterol (PROAIR HFA) 108 (90 BASE) MCG/ACT inhaler Inhale 2 puffs into the lungs every 6 (six) hours as needed.        [name] amphetamine-dextroamphetamine (ADDERALL XR) 20 MG 24 hr capsule Take 2 capsules (40 mg total) by mouth every morning.  60 capsule  0  . amphetamine-dextroamphetamine (ADDERALL XR) 20 MG 24 hr capsule Take 2 capsules daily  60 capsule  0  . amphetamine-dextroamphetamine (ADDERALL XR) 20 MG 24 hr capsule Take 2 capsules daily fill after 7  /15/2014  60 capsule  0  . aspirin 325 MG tablet Take 325 mg by mouth daily.        . clonazePAM (KLONOPIN) 1 MG tablet Take 2 tablets (2 mg total) by mouth at bedtime as needed.  60 tablet  3  . desvenlafaxine (PRISTIQ) 50 MG 24 hr tablet Take 1 tablet (50 mg total) by mouth daily.  30 tablet  10  . doxepin (SINEQUAN) 25 MG capsule Take 1 capsule (25 mg total) by mouth at bedtime.  30 capsule  10  . gabapentin (NEURONTIN) 300 MG capsule Take 300 mg by mouth daily.        [name] glatiramer (COPAXONE) 20 MG/ML injection Inject 20 mg into the skin daily.        [name] oxyCODONE-acetaminophen (PERCOCET) 5-325 MG per tablet Take 1 tablet by mouth every 6 (six) hours as needed.         No current facility-administered medications for this visit.    Lab Results: No results found for this or any previous visit (from the past 48 hour(s)).  Physical Findings: AIMS:  , ,  ,  ,    CIWA:    COWS:     Treatment Plan Summary: Medication management  Plan:continue all medications specifically Adderall 20 mg 2 in the morning the patient return to see me in 3 months  Medical Decision Making Problem Points:  Established problem, worsening (2) Data Points:  Review of new medications or change in dosage (2)  I certify that inpatient services furnished can reasonably be expected to improve the patient's condition.   Lucas Mallow 11/18/2012, 2:17 PM

## 2012-11-20 ENCOUNTER — Ambulatory Visit (HOSPITAL_COMMUNITY): Payer: Self-pay | Admitting: Psychiatry

## 2012-12-10 ENCOUNTER — Ambulatory Visit
Admission: RE | Admit: 2012-12-10 | Discharge: 2012-12-10 | Disposition: A | Discharge status: Home / Self Care | Payer: Medicare Other | Source: Ambulatory Visit | Attending: Family Medicine | Admitting: Family Medicine

## 2012-12-10 DIAGNOSIS — Z1231 Encounter for screening mammogram for malignant neoplasm of breast: Secondary | ICD-10-CM

## 2012-12-10 DIAGNOSIS — Z780 Asymptomatic menopausal state: Secondary | ICD-10-CM

## 2012-12-31 ENCOUNTER — Other Ambulatory Visit (HOSPITAL_COMMUNITY): Payer: Self-pay | Admitting: *Deleted

## 2012-12-31 DIAGNOSIS — F329 Major depressive disorder, single episode, unspecified: Secondary | ICD-10-CM

## 2012-12-31 MED ORDER — CLONAZEPAM 1 MG PO TABS: 2.0000 mg | ORAL_TABLET | Freq: Every evening | ORAL | Status: DC | PRN

## 2012-12-31 NOTE — Telephone Encounter (Signed)
Clonazepam reordered as note dated 11/18/12 from Dr. Donell Beers states to continue all mediations. No refills given. Called into [place] [facility].

## 2013-01-07 ENCOUNTER — Ambulatory Visit
Admission: RE | Admit: 2013-01-07 | Discharge: 2013-01-07 | Disposition: A | Discharge status: Home / Self Care | Payer: Medicare Other | Source: Ambulatory Visit | Attending: Nurse Practitioner | Admitting: Nurse Practitioner

## 2013-01-07 ENCOUNTER — Other Ambulatory Visit: Payer: Self-pay | Admitting: Nurse Practitioner

## 2013-01-07 DIAGNOSIS — M25562 Pain in left knee: Secondary | ICD-10-CM

## 2013-01-07 DIAGNOSIS — M25472 Effusion, left ankle: Secondary | ICD-10-CM

## 2013-01-07 DIAGNOSIS — M25572 Pain in left ankle and joints of left foot: Secondary | ICD-10-CM

## 2013-01-21 ENCOUNTER — Telehealth: Payer: Self-pay | Admitting: Internal Medicine

## 2013-01-21 NOTE — Telephone Encounter (Signed)
Pt CT is scheduled for 02/16/13. She was called bc she is due for OV as stated in recall tab. I called pt and scheduled appt. Nothing further needed

## 2013-01-26 ENCOUNTER — Other Ambulatory Visit (HOSPITAL_COMMUNITY): Payer: Self-pay | Admitting: *Deleted

## 2013-01-26 DIAGNOSIS — F329 Major depressive disorder, single episode, unspecified: Secondary | ICD-10-CM

## 2013-01-26 MED ORDER — CLONAZEPAM 1 MG PO TABS: 2.0000 mg | ORAL_TABLET | Freq: Every evening | ORAL | Status: DC | PRN

## 2013-02-16 ENCOUNTER — Ambulatory Visit (INDEPENDENT_AMBULATORY_CARE_PROVIDER_SITE_OTHER)
Admission: RE | Admit: 2013-02-16 | Discharge: 2013-02-16 | Disposition: A | Discharge status: Home / Self Care | Payer: Medicare Other | Source: Ambulatory Visit | Attending: Internal Medicine | Admitting: Internal Medicine

## 2013-02-16 DIAGNOSIS — R911 Solitary pulmonary nodule: Secondary | ICD-10-CM

## 2013-02-19 ENCOUNTER — Ambulatory Visit (INDEPENDENT_AMBULATORY_CARE_PROVIDER_SITE_OTHER): Payer: Medicare Other | Admitting: Psychiatry

## 2013-02-19 ENCOUNTER — Ambulatory Visit (INDEPENDENT_AMBULATORY_CARE_PROVIDER_SITE_OTHER): Payer: Medicare Other | Admitting: Internal Medicine

## 2013-02-19 VITALS — BP 122/78 | HR 68 | Resp 16 | Ht 61.0 in | Wt 118.2 lb

## 2013-02-19 VITALS — BP 130/82 | HR 68 | Temp 98.0°F | Ht 61.0 in | Wt 119.0 lb

## 2013-02-19 DIAGNOSIS — R911 Solitary pulmonary nodule: Secondary | ICD-10-CM

## 2013-02-19 DIAGNOSIS — F17200 Nicotine dependence, unspecified, uncomplicated: Secondary | ICD-10-CM

## 2013-02-19 DIAGNOSIS — F329 Major depressive disorder, single episode, unspecified: Secondary | ICD-10-CM

## 2013-02-19 DIAGNOSIS — F3342 Major depressive disorder, recurrent, in full remission: Secondary | ICD-10-CM

## 2013-02-19 DIAGNOSIS — R0609 Other forms of dyspnea: Secondary | ICD-10-CM

## 2013-02-19 DIAGNOSIS — R0600 Dyspnea, unspecified: Secondary | ICD-10-CM

## 2013-02-19 DIAGNOSIS — F332 Major depressive disorder, recurrent severe without psychotic features: Secondary | ICD-10-CM | POA: Insufficient documentation

## 2013-02-19 DIAGNOSIS — Z23 Encounter for immunization: Secondary | ICD-10-CM

## 2013-02-19 MED ORDER — AMPHETAMINE-DEXTROAMPHET ER 20 MG PO CP24: ORAL_CAPSULE | ORAL | Status: DC

## 2013-02-19 MED ORDER — DESVENLAFAXINE SUCCINATE ER 50 MG PO TB24: 50.0000 mg | ORAL_TABLET | Freq: Every day | ORAL | Status: DC

## 2013-02-19 MED ORDER — CLONAZEPAM 1 MG PO TABS: 2.0000 mg | ORAL_TABLET | Freq: Every evening | ORAL | Status: DC | PRN

## 2013-02-19 MED ORDER — DOXEPIN HCL 25 MG PO CAPS: 25.0000 mg | ORAL_CAPSULE | Freq: Every day | ORAL | Status: DC

## 2013-02-19 NOTE — Patient Instructions (Addendum)
#  smoking  - glad you quit conventional cigarette - keep in mind that while e-cigs have benefits like getting rid of smoker cough and smell, you still at risk for worsening heart issues, cancer, copd at future  #Shortness of breath   - I think this is related to heart and mild emphysema on scan  - continue albuterol as needed and daily exercises - respect your decision to refuse maintenance spiriva - have flu shot 02/19/2013   #Lung nodule - posterior left upper lobe 1.3cm - no change between 2011 and 2014; and is benign - for your satisfaction will do another CT chest end 2015  #ROV  - dec 2015 after CT chest - flu shot today 02/19/2014

## 2013-02-19 NOTE — Progress Notes (Signed)
[facility] MD Progress Note  02/19/2013 10:18 AM [name]  MRN:  [id number] Subjective: Energy seems lower Today the patient feels fatigued. She claims is his typical as the winter comes upon her. She denies being persistently depressed. Her mood is stable. She continues to sleep and eat well. Her energy level is somewhat lower. The patient is still able to enjoy things. She reads watches TV and does art projects. The patient uses no alcohol no drugs. The patient as usual is very articulate very outgoing very animated. She talked about her lack of self-confidence in terms of cognitive abilities. I suggested that she think about going back to work part-time. She believes she can not take the challenge. At this time her MS seems to be stable. She's having no focal neurological symptoms, no visual problems and no sensory disturbances. The patient takes her medications as prescribed. Diagnosis:   DSM5: Schizophrenia Disorders:   Obsessive-Compulsive Disorders:   Trauma-Stressor Disorders:   Substance/Addictive Disorders:   Depressive Disorders:  Major Depressive Disorder - Mild (296.21)  Axis I: Major Depression, single episode  ADL's:  Intact  Sleep: Good  Appetite:  Good  Suicidal Ideation:  no Homicidal Ideation:  none AEB (as evidenced by):  Psychiatric Specialty Exam: ROS  Blood pressure 122/78, pulse 68, resp. rate 16, height 5\' 1"  (1.549 m), weight 118 lb 3.2 oz (53.615 kg).Body mass index is 22.35 kg/(m^2).  General Appearance: Casual and Meticulous  Eye Contact::  Good  Speech:  Clear and Coherent  Volume:  Normal  Mood:  Euthymic  Affect:  Congruent  Thought Process:  Coherent  Orientation:  Full (Time, Place, and Person)  Thought Content:  WDL  Suicidal Though her  none  Homicidal Thoughts:  No  Memory:  NA  Judgement:  Good  Insight:  Good  Psychomotor Activity:  Normal  Concentration:  Good  Recall:  Good  Akathisia:  No  Handed:  Right  AIMS (if indicated):      Assets:  Communication Skills  Sleep:      Current Medications: Current Outpatient Prescriptions  Medication Sig Dispense Refill  . albuterol (PROAIR HFA) 108 (90 BASE) MCG/ACT inhaler Inhale 2 puffs into the lungs every 6 (six) hours as needed.        [name] amphetamine-dextroamphetamine (ADDERALL XR) 20 MG 24 hr capsule Take 2 capsules (40 mg total) by mouth every morning.  60 capsule  0  . amphetamine-dextroamphetamine (ADDERALL XR) 20 MG 24 hr capsule Take 2 capsules daily  60 capsule  0  . amphetamine-dextroamphetamine (ADDERALL XR) 20 MG 24 hr capsule Refill after 03/12/2013  60 capsule  0  . amphetamine-dextroamphetamine (ADDERALL XR) 20 MG 24 hr capsule Take 2 capsules daily fill after 04/12/2013  60 capsule  0  . aspirin 325 MG tablet Take 325 mg by mouth daily.        . clonazePAM (KLONOPIN) 1 MG tablet Take 2 tablets (2 mg total) by mouth at bedtime as needed.  60 tablet  5  . desvenlafaxine (PRISTIQ) 50 MG 24 hr tablet Take 1 tablet (50 mg total) by mouth daily.  30 tablet  10  . doxepin (SINEQUAN) 25 MG capsule Take 1 capsule (25 mg total) by mouth at bedtime.  30 capsule  10  . gabapentin (NEURONTIN) 300 MG capsule Take 300 mg by mouth daily.        [name] glatiramer (COPAXONE) 20 MG/ML injection Inject 20 mg into the skin daily.        [name]  oxyCODONE-acetaminophen (PERCOCET) 5-325 MG per tablet Take 1 tablet by mouth every 6 (six) hours as needed.         No current facility-administered medications for this visit.    Lab Results: No results found for this or any previous visit (from the past 48 hour(s)).  Physical Findings: AIMS:  , ,  ,  ,    CIWA:    COWS:     Treatment Plan Summary: At this time the patient she'll continue taking Adderall XR 20 mg 2 in the morning. She she'll also continue her Pristiue 50 mg and her doxepin. This patient to return to see me in 3 months and at that time we'll take a closer look at the issue of depression. At this time the patient is near her  baseline. Plan:  Medical Decision Making Problem Points:  Established problem, stable/improving (1) Data Points:  Review of new medications or change in dosage (2)  I certify that inpatient services furnished can reasonably be expected to improve the patient's condition.   [name] 02/19/2013, 10:18 AM

## 2013-02-19 NOTE — Progress Notes (Signed)
Subjective:    Patient ID: [name], female    DOB: [birth year], 67 y.o.   MRN: [id number]  HPI 67 year old smoker, DJD, OA, Fibromyalgia, Osteoporosis, Hiatal Hernia., GERD, Thyroid nodules and on chronic narcs for chronic back pain, hix of skin cancers  Referred for LUL 8 mm apical-medial nodule by Dr. Shirline Frees. First had MRI mid-dec 2011 and noted to have LUL nodule. Followed by CT chest that confirmed same. Fu CT chest 3 months later in March showed no change (independently reviewed). She currently feels at baseline health with chronic pain and baseline dyspnea. States dyspnea is chronic, relieved by pro-air, brought on by treadmill exertion, present for few years, worse past few months, class 2 severity. Also, reports associated associated "burning all over lungs" that comes on treadmill but goes away after continued 10 minute of exertion. Associated chronic cough with clear mild mucoid sputum with occassional yellow sputum. REcollects cath 2002; had RCA spasm per records  She is not interested in quitting smoking, visiting MDs unless she has to, seeing cards for chest pain. Does not want me to be more aggressive with followup and workup of dyspnea  REC Have repeat ct chest in 6 months  Have simple spirometry at followup for workup of your shortness of breath  Please quit smoking   OV 03/14/11 Followup  a) LUL Nodule - apical, posterior segment, subpleural first noted dec 2011 B) Smoking: C) chronic class 2 exertional dyspnea for which she has refused workup (known to have CAD)  She has had CT chest 03/08/11: LUL nodule is unchnaged from dec 2011. No respiratory issues. Still smoking but changed to electronic cigs and working by self to quit. Exertional class 2 dyspnea persists unchanged. Recent cath shows ef 45%. Spirometry today is normal - fev1 1.68L/83%. Overall mood is low (see below)  Past Med hx:  1. Had cath July 2012: ef 45%, angina. On med mgmt with Dr Shon Baton. Emotional  distress + (causes chest pain - see below) 2. C/o slow melena due to old polyps 3. No hx of having mammograms and pap smears     REC #smoking  - glad you are working on quitting #Shortness of breath   - I think this is related to heart #Lung nodule - have ct chest without contrast in 6 months and followup then  - 6 months with ct chest   OV 02/20/2012  #Shortness of Breath: STable. Mild. Brought on by exertion but only with extremes. Walking dog for 1 mile x 2 times per day does not bother her.  YArd work does not cause dyspnea. However, weather changes can make her mildly dyspneic. This is improved with albuterol prn. CT shows mild centrilobular emphysema. Has not had but willing to have flu shot today 02/20/2012   #Smoking  - quit conventional cigs. On e-cigs since Aug 2012; without tar or nicotine. The smoker's cough is resolved. She is unaware that risks of smoking still exist; had to counsel her  #Lung nodule - see detailed report below. No change in 1.67mc nodule LUL subpleural nodule posteriorly dec 2011 to aug 2013   CT chest 01/10/12 IMPRESSION:  1. 1.3 x 0.8 cm subpleural nodule in the posterior aspect of the  left upper lobe is unchanged compared to remote prior study  05/17/2010. At this time, we have established 20 months of  stability, strongly suggestive of a benign lesion. Strictly  speaking, 24 months of stability is typically required to establish  a lesion  as definitively benign. If desired, repeat CT of the  thorax in 1 year could be obtained to establish greater than 24  months of stability in this patient with mild smoking related  changes in the lungs.  2. Mild centrilobular emphysema and mild diffuse bronchial wall  thickening.  3. Atherosclerosis, including left main and two-vessel coronary  artery disease. Assessment for potential risk factor modification,  dietary therapy or pharmacologic therapy may be warranted, if  clinically indicated.  Original  Report Authenticated By: Florencia Reasons, M.D.    Past, Family, Social reviewed: Moved on from grief of losing dog. She has new dog.  Neighbor and close friend who was dx with metastatic cancer has died.   REC #smoking  - glad you quit conventional cigarette - keep in mind that while e-cigs have benefits like getting rid of smoker cough and smell, you still at risk for worsening heart issues, cancer, copd at future  #Shortness of breath   - I think this is related to heart and mild emphysema on scan  - continue albuterol as needed and daily exercises  #Lung nodule - have ct chest without contrast in 12 months and followup then  #ROV  - 12 months with ct chest - flu shot today 02/20/2012   OV 02/19/2013  Followup for -Dyspnea due to emphysema and cardiac issues: Is mild and class II in exertion. She is doing well. She uses albuterol as needed. She's not interested in maintenance Spiriva or Symbicort. She does occasional treadmill activities. She's uses  pro-air as needed. She will have a flu shot today  - Smoking: She maintains she is quit conventional cigarettes more than a year ago. She still occasionally smokes electronic cigarettes daily once a day. I again once her about the medical harm with e-cig  - Left upper lobe lung nodule.: This is now stable from 2011 through 2014. Most recent CT scan of the chest was on 02/16/2013. This is a benign etiology. However, due to her smoking history she insists on having annual CT scan of the chest using nodule as indication   CT chest 02/16/13 TECHNIQUE:  Multidetector CT imaging of the chest was performed following the  standard protocol without IV contrast.  COMPARISON: 01/10/2012 and 05/17/2010  FINDINGS:  13 x 7 mm subpleural nodule in the posterior left upper lobe (series  3/image 10), unchanged since 2011, benign.  No new/suspicious pulmonary nodules. No pleural effusion or  pneumothorax.  Stable left thyroid nodule.  The  heart is normal in size. No pericardial effusion. Coronary  atherosclerosis. Atherosclerotic calcifications of the aortic arch.  Small mediastinal lymph nodes which do not meet pathologic CT size  criteria. No suspicious axillary lymphadenopathy.  Visualized upper abdomen is unremarkable.  Degenerative changes of the visualized thoracolumbar spine.  IMPRESSION:  13 x 7 mm subpleural nodule in the posterior left upper lobe,  unchanged since 2011, benign.  No dedicated follow-up imaging is required per Fleischner Society  guidelines given greater than 2 year stability.  This recommendation follows the consensus statement: Guidelines for  Management of Small Pulmonary Nodules Detected on CT Scans: A  Statement from the Fleischner Society as published in Radiology  2005; 237:395-400.  Electronically Signed  By: Charline Bills M.D.  On: 02/16/2013 13:06   Review of Systems  Constitutional: Negative for fever and unexpected weight change.  HENT: Positive for sneezing. Negative for ear pain, nosebleeds, congestion, sore throat, rhinorrhea, trouble swallowing, dental problem, postnasal drip and sinus pressure.  Eyes: Negative for redness and itching.  Respiratory: Negative for cough, chest tightness, shortness of breath and wheezing.   Cardiovascular: Negative for palpitations and leg swelling.  Gastrointestinal: Negative for nausea and vomiting.  Genitourinary: Negative for dysuria.  Musculoskeletal: Negative for joint swelling.  Skin: Negative for rash.  Neurological: Negative for headaches.  Hematological: Does not bruise/bleed easily.  Psychiatric/Behavioral: Negative for dysphoric mood. The patient is not nervous/anxious.        Objective:   Physical Exam    Physical Exam  Vitals reviewed. Constitutional: She is oriented to person, place, and time. She appears well-developed and well-nourished. No distress.  HENT:  Head: Normocephalic and atraumatic.  Right Ear: External  ear normal.  Left Ear: External ear normal.  Mouth/Throat: Oropharynx is clear and moist. No oropharyngeal exudate.  Eyes: Conjunctivae normal and EOM are normal. Pupils are equal, round, and reactive to light. Right eye exhibits no discharge. Left eye exhibits no discharge. No scleral icterus.  Neck: Normal range of motion. Neck supple. No JVD present. No tracheal deviation present. No thyromegaly present.  Cardiovascular: Normal rate, regular rhythm, normal heart sounds and intact distal pulses.  Exam reveals no gallop and no friction rub.   No murmur heard. Pulmonary/Chest: Effort normal and breath sounds normal. No respiratory distress. She has no wheezes. She has no rales. She exhibits no tenderness.  Abdominal: Soft. Bowel sounds are normal. She exhibits no distension and no mass. There is no tenderness. There is no rebound and no guarding.  Musculoskeletal: Normal range of motion. She exhibits no edema and no tenderness.  Lymphadenopathy:    She has no cervical adenopathy.  Neurological: She is alert and oriented to person, place, and time. She has normal reflexes. No cranial nerve deficit. She exhibits normal muscle tone. Coordination normal.  Skin: Skin is warm and dry. No rash noted. She is not diaphoretic. No erythema. No pallor.  Psychiatric: She has a normal mood and affect. Her behavior is normal. Judgment and thought content normal.       Assessment & Plan:   6

## 2013-02-21 ENCOUNTER — Encounter: Payer: Self-pay | Admitting: Internal Medicine

## 2013-02-21 NOTE — Assessment & Plan Note (Signed)
  #  Lung nodule - posterior left upper lobe 1.3cm - no change between 2011 and 2014; and is benign - for your satisfaction will do another CT chest end 2015  #ROV  - dec 2015 after CT chest - flu shot today 02/19/2014

## 2013-02-21 NOTE — Assessment & Plan Note (Signed)
#  Shortness of breath   - I think this is related to heart and mild emphysema on scan  - continue albuterol as needed and daily exercises - respect your decision to refuse maintenance spiriva - have flu shot 02/19/2013

## 2013-02-21 NOTE — Assessment & Plan Note (Addendum)
#  smoking  - glad you quit conventional cigarette - keep in mind that while e-cigs have benefits like getting rid of smoker cough and smell, you still at risk for worsening heart issues, cancer, copd at future  3 min face to face counseling

## 2013-04-12 ENCOUNTER — Telehealth (HOSPITAL_COMMUNITY): Payer: Self-pay | Admitting: *Deleted

## 2013-04-12 NOTE — Telephone Encounter (Signed)
Pt wondering if Adderall XR might be increased. Does not seem as effective as it was. Advised pt that MD would be sent message electronically

## 2013-04-14 ENCOUNTER — Telehealth (HOSPITAL_COMMUNITY): Payer: Self-pay | Admitting: *Deleted

## 2013-04-14 NOTE — Telephone Encounter (Signed)
Increase  Pritique to 100 mg qday

## 2013-04-14 NOTE — Telephone Encounter (Signed)
Opened in error

## 2013-04-14 NOTE — Telephone Encounter (Signed)
Informed pt that MD has increased her Pristiq to 100 mg day.Pt states she will try tis, but she tried a year ago and it made her really tier.She will let office know on Monday if she can tolerate increased dose. Pt states she has enough medicine at this time, does not require refill to take 2/day

## 2013-04-19 ENCOUNTER — Other Ambulatory Visit: Payer: Self-pay | Admitting: Anesthesiology

## 2013-04-19 DIAGNOSIS — M545 Low back pain: Secondary | ICD-10-CM

## 2013-04-21 IMAGING — CT CT CHEST W/O CM
2 of 3 series · 15 of 36 positions shown, 18 images · IV contrast (Omnipaque 300)
Comparison: CT dated 08/24/2010 and 05/17/2010

CLINICAL DATA: Follow-up lung nodule

CT CHEST WITHOUT CONTRAST
TECHNIQUE: Multidetector CT imaging of the chest was performed
following the standard protocol without IV contrast.

[Series 2: chest routine with · axial · 0.67mm/px · z∈[-188,+52]mm · 12 of 58 slices shown, 15 images]
[im 5/58  mediastinal]
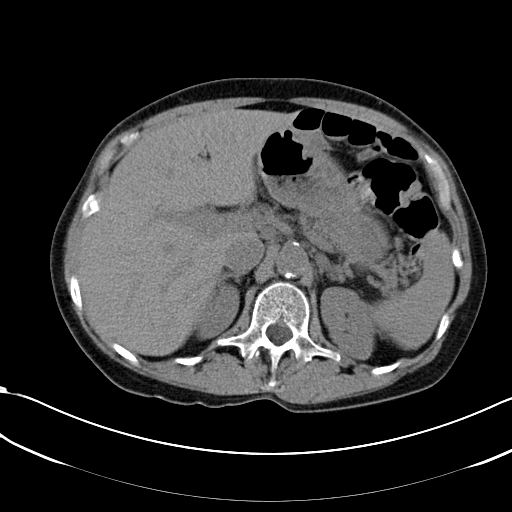
[im 5/58  lung]
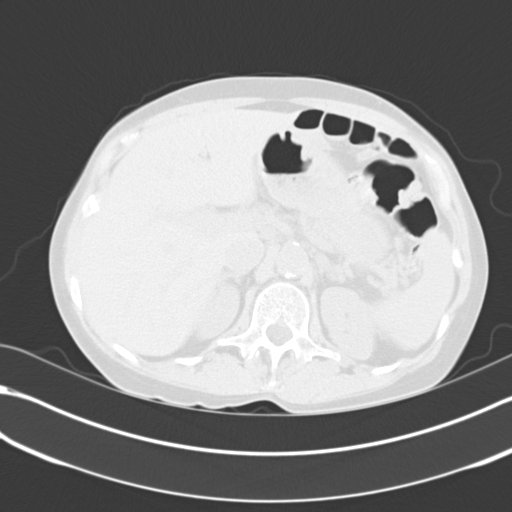
[im 9/58  lung]
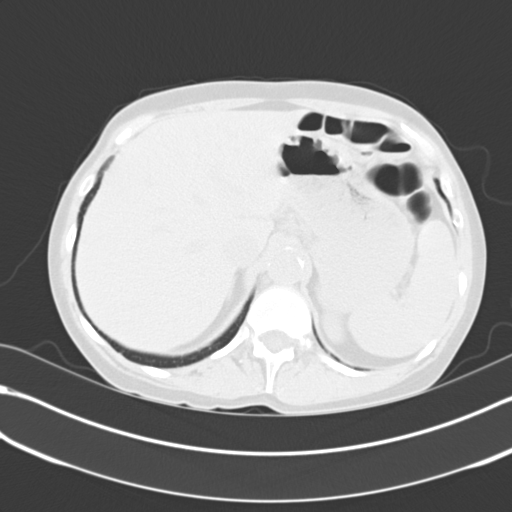
[im 13/58  lung]
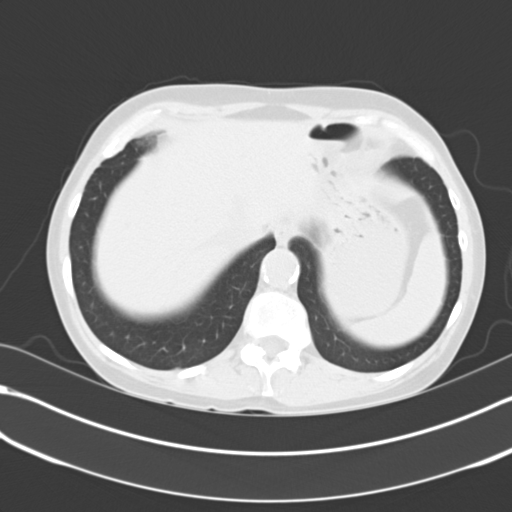
[im 17/58  lung]
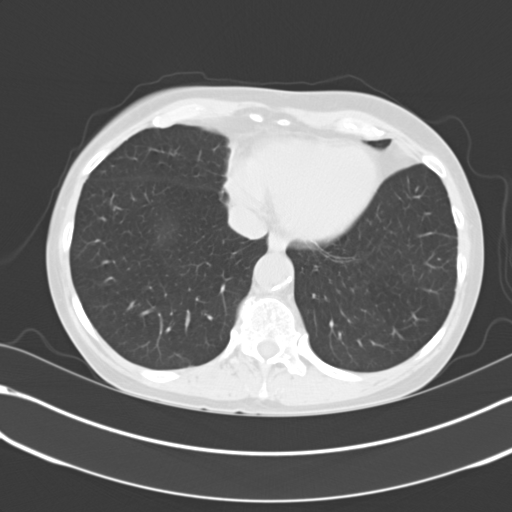
[im 22/58  mediastinal]
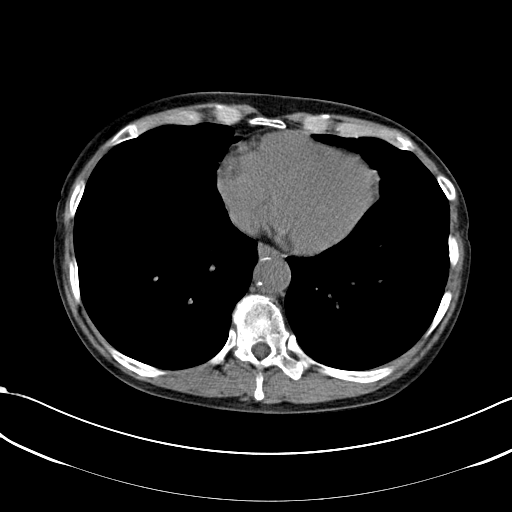
[im 22/58  lung]
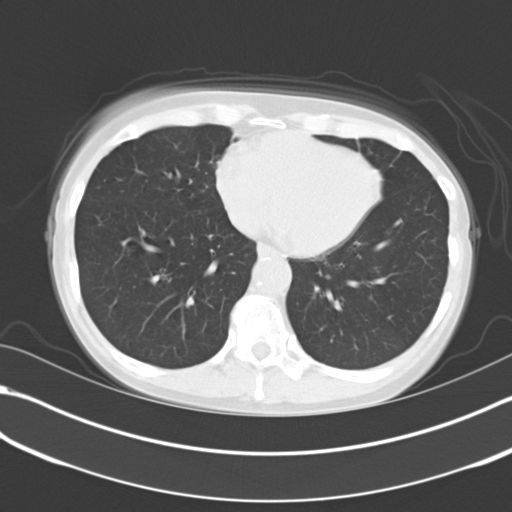
[im 26/58  lung]
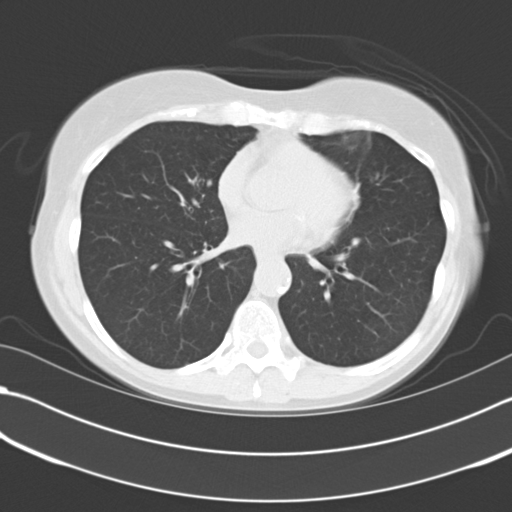
[im 32/58  lung]
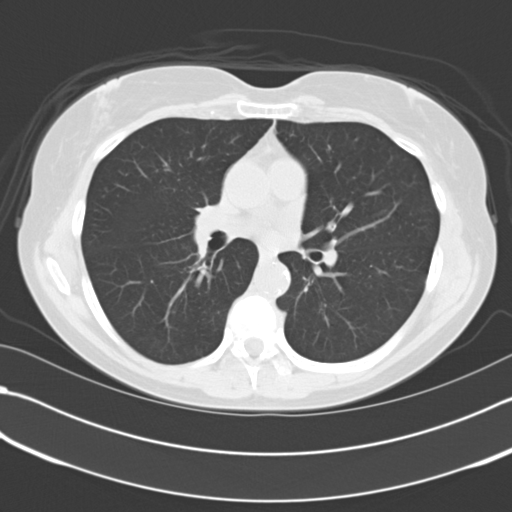
[im 36/58  lung]
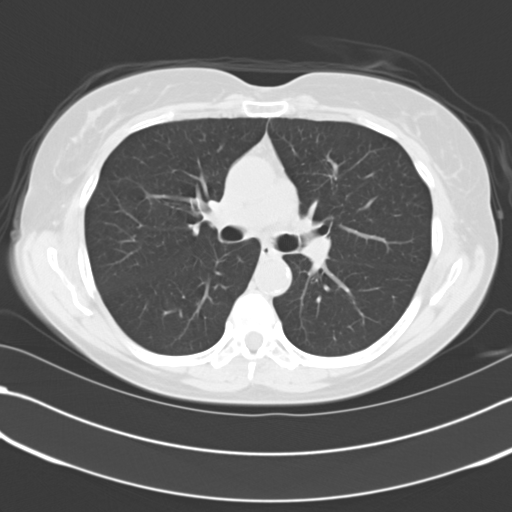
[im 41/58  mediastinal]
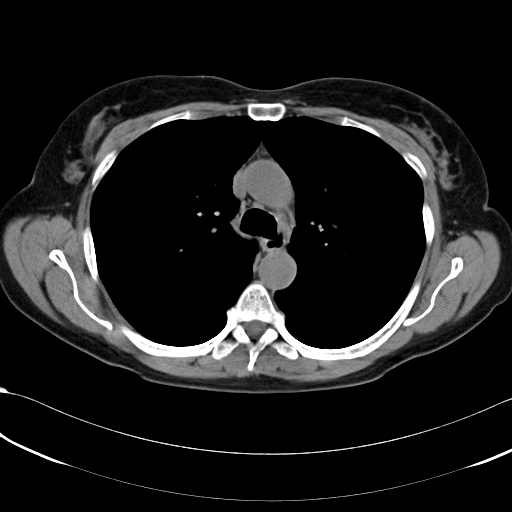
[im 41/58  lung]
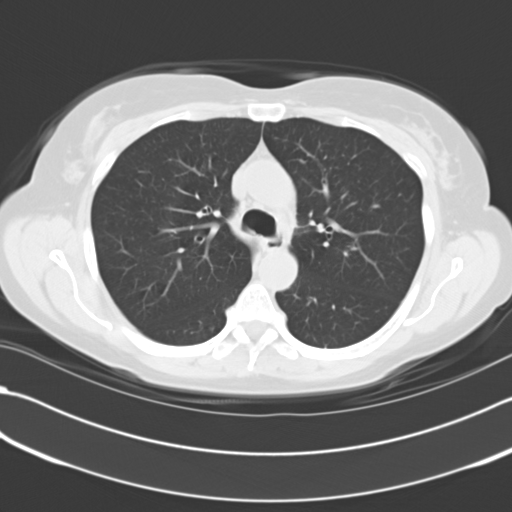
[im 45/58  lung]
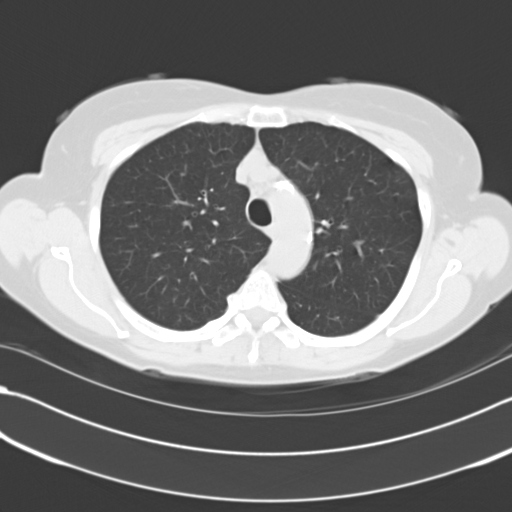
[im 49/58  lung]
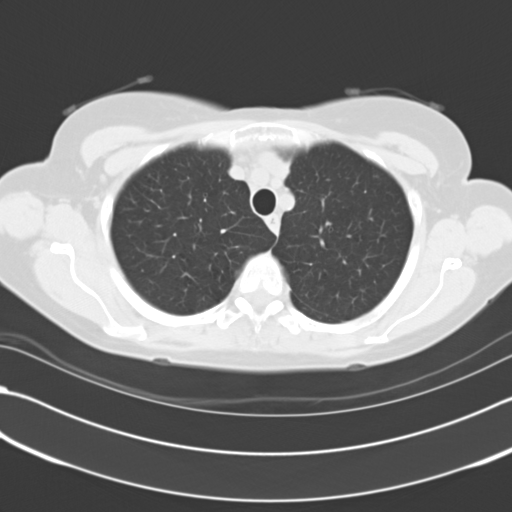
[im 53/58  lung]
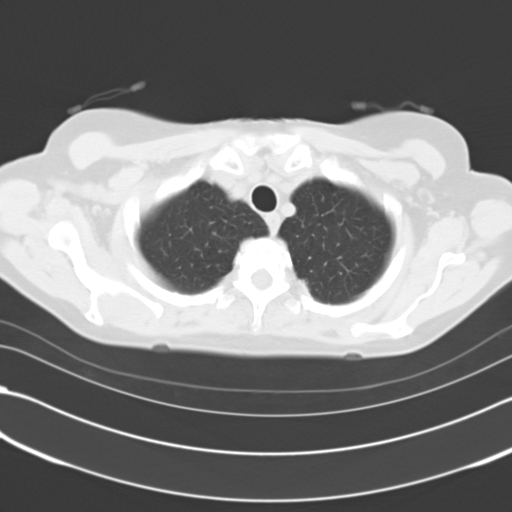

[Series 602: cor · coronal · 0.67mm/px · 3 of 89 slices shown]
[im 18/89  lung]
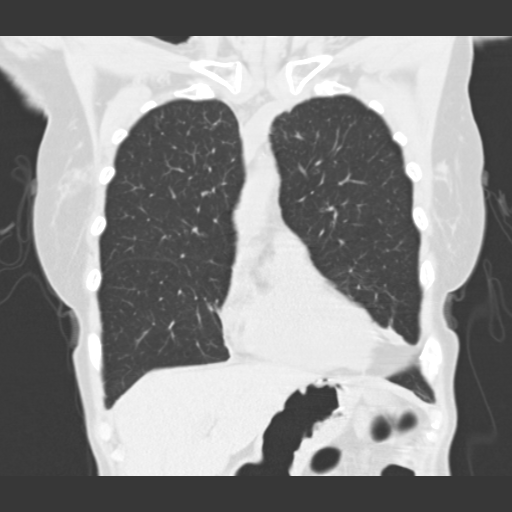
[im 36/89  lung]
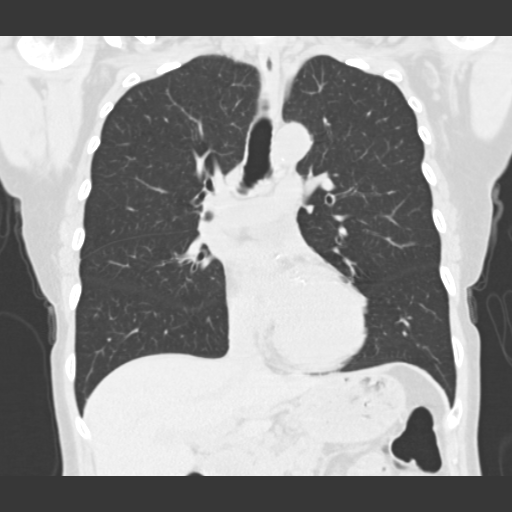
[im 53/89  lung]
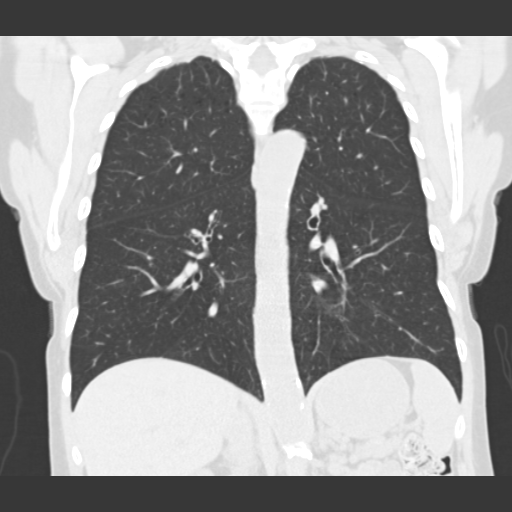

[15 of 36 positions shown; findings below may reference images not displayed]

FINDINGS: 0.8 x 1.3 cm triangular subpleural opacity in the
posterior left upper lobe (series 3/image 7), stable from [DATE].
This appearance favors scarring or possibly a prior pulmonary
infarct.  The lungs are otherwise essentially clear.  No pleural
effusion or pneumothorax.

Visualized thyroid is notable for bilateral thyroid nodules.

The heart is normal in size.  No pericardial effusion.  Coronary
atherosclerosis.  Atherosclerotic calcifications of the aortic
arch.

No suspicious mediastinal or axillary lymphadenopathy.

Visualized upper abdomen is unremarkable.

Mild degenerative changes of the visualized thoracolumbar spine.
IMPRESSION: 1.3 cm triangular subpleural opacity in the posterior left upper
lobe, stable from [DATE], favored to reflect scarring or possibly
a prior pulmonary infarct.

## 2013-04-28 ENCOUNTER — Ambulatory Visit
Admission: RE | Admit: 2013-04-28 | Discharge: 2013-04-28 | Disposition: A | Discharge status: Home / Self Care | Payer: Medicare Other | Source: Ambulatory Visit | Attending: Anesthesiology | Admitting: Anesthesiology

## 2013-04-28 DIAGNOSIS — M545 Low back pain: Secondary | ICD-10-CM

## 2013-04-28 MED ORDER — GADOBENATE DIMEGLUMINE 529 MG/ML IV SOLN
10.0000 mL | Freq: Once | INTRAVENOUS | Status: AC | PRN
  Administered 2013-04-28: 10 mL via INTRAVENOUS

## 2013-05-03 ENCOUNTER — Other Ambulatory Visit: Payer: Self-pay | Admitting: Nurse Practitioner

## 2013-05-03 DIAGNOSIS — E049 Nontoxic goiter, unspecified: Secondary | ICD-10-CM

## 2013-05-05 ENCOUNTER — Telehealth (HOSPITAL_COMMUNITY): Payer: Self-pay | Admitting: *Deleted

## 2013-05-05 DIAGNOSIS — F909 Attention-deficit hyperactivity disorder, unspecified type: Secondary | ICD-10-CM

## 2013-05-05 MED ORDER — AMPHETAMINE-DEXTROAMPHET ER 30 MG PO CP24: 60.0000 mg | ORAL_CAPSULE | Freq: Every day | ORAL | Status: DC

## 2013-05-05 NOTE — Telephone Encounter (Signed)
Pt left VM:Has tried increasing Pristiq,added Wellbutrin,& tried Abilify, but still has decreased energy.None of them helped.Doubling Adderall did help.Does not want to get Dr.Plovsky in trouble, but double Adderall is all that worked.Just wants quality of life.

## 2013-05-05 NOTE — Addendum Note (Signed)
Addended by: Tonny Bollman on: 05/05/2013 05:41 PM   Modules accepted: Orders, Medications

## 2013-05-06 ENCOUNTER — Ambulatory Visit
Admission: RE | Admit: 2013-05-06 | Discharge: 2013-05-06 | Disposition: A | Discharge status: Home / Self Care | Payer: Medicare Other | Source: Ambulatory Visit | Attending: Nurse Practitioner | Admitting: Nurse Practitioner

## 2013-05-06 DIAGNOSIS — E049 Nontoxic goiter, unspecified: Secondary | ICD-10-CM

## 2013-05-07 ENCOUNTER — Telehealth (HOSPITAL_COMMUNITY): Payer: Self-pay

## 2013-05-07 NOTE — Telephone Encounter (Signed)
05/07/13 pt's came and pick-up rx script.Marland KitchenMarguerite [name]

## 2013-05-12 ENCOUNTER — Telehealth: Payer: Self-pay | Admitting: Diagnostic Neuroimaging

## 2013-05-12 ENCOUNTER — Encounter: Payer: Self-pay | Admitting: Diagnostic Neuroimaging

## 2013-05-12 ENCOUNTER — Encounter (INDEPENDENT_AMBULATORY_CARE_PROVIDER_SITE_OTHER): Payer: Self-pay

## 2013-05-12 ENCOUNTER — Ambulatory Visit (INDEPENDENT_AMBULATORY_CARE_PROVIDER_SITE_OTHER): Payer: Medicare Other | Admitting: Diagnostic Neuroimaging

## 2013-05-12 VITALS — BP 114/75 | HR 81 | Ht 61.0 in | Wt 118.0 lb

## 2013-05-12 DIAGNOSIS — G35 Multiple sclerosis: Secondary | ICD-10-CM

## 2013-05-12 NOTE — Patient Instructions (Signed)
I will check MRI brain.  

## 2013-05-12 NOTE — Telephone Encounter (Signed)
LM FOR PATIENT TO CALL AND SCHEDULE 3 MONTH F/U APPT WITH DR. Marjory Lies.
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
## 2013-05-21 ENCOUNTER — Ambulatory Visit (INDEPENDENT_AMBULATORY_CARE_PROVIDER_SITE_OTHER): Payer: Medicare Other | Admitting: Psychiatry

## 2013-05-21 VITALS — BP 125/79 | HR 81 | Ht 61.0 in | Wt 121.0 lb

## 2013-05-21 DIAGNOSIS — F909 Attention-deficit hyperactivity disorder, unspecified type: Secondary | ICD-10-CM

## 2013-05-21 MED ORDER — AMPHETAMINE-DEXTROAMPHET ER 30 MG PO CP24: 60.0000 mg | ORAL_CAPSULE | Freq: Every day | ORAL | Status: DC

## 2013-05-21 NOTE — Progress Notes (Signed)
[facility] MD Progress Note  05/21/2013 11:14 AM [name]  MRN:  [id number] Subjective:  Feels well  Today the patient was seen one time. At this time the patient is doing better. Over the last month we went ahead and increased her Adderall from 40 mg to 60 mg and she can feel a distinct improvement. He clearly gives her more focused and more energy. This patient has multiple sclerosis is doing fairly well neurologically. She is going to have a MRI of her head in the next few weeks. At this time she has no neurological complaints. The patient says she can focus and concentrate very well. She's not depressed. She is sleeping and eating well. She has good energy. She enjoys watching TV enjoys her dog and enjoys cooking. The patient does not drink any alcohol at all. Presently she is not eating but she has a very good social group. The patient feels healthy physically and mentally. She denies any chest pain or shortness of breath. Today reviewed the pros and cons of her medications and she agreed to take them as prescribed. Diagnosis:   DSM5: Schizophrenia Disorders:   Obsessive-Compulsive Disorders:   Trauma-Stressor Disorders:   Substance/Addictive Disorders:   Depressive Disorders:    Axis I: ADHD, hyperactive type  ADL's:  Intact  Sleep: Good  Appetite:  Good  Suicidal Ideation:  no Homicidal Ideation:  no AEB (as evidenced by):  Psychiatric Specialty Exam: ROS  Blood pressure 125/79, pulse 81, height 5\' 1"  (1.549 m), weight 121 lb (54.885 kg).Body mass index is 22.87 kg/(m^2).  General Appearance: Well Groomed  Patent attorney::  Good  Speech:  Clear and Coherent  Volume:  Normal  Mood:  Euthymic  Affect:  Appropriate  Thought Process:  Coherent  Orientation:  Full (Time, Place, and Person)  Thought Content:  WDL  Suicidal Thoughts:  No  Homicidal Thoughts:  No  Memory:  NA  Judgement:  Good  Insight:  Good  Psychomotor Activity:  Normal  Concentration:  Good  Recall:   Good  Akathisia:  No  Handed:  Right  AIMS (if indicated):     Assets:  Communication Skills  Sleep:      Current Medications: Current Outpatient Prescriptions  Medication Sig Dispense Refill  . albuterol (PROAIR HFA) 108 (90 BASE) MCG/ACT inhaler Inhale 2 puffs into the lungs every 6 (six) hours as needed.        [name] amphetamine-dextroamphetamine (ADDERALL XR) 30 MG 24 hr capsule Take 2 capsules (60 mg total) by mouth daily.  60 capsule  0  . amphetamine-dextroamphetamine (ADDERALL XR) 30 MG 24 hr capsule Take 2 capsules (60 mg total) by mouth daily. Fill after 06/17/2013  60 capsule  0  . aspirin 325 MG tablet Take 325 mg by mouth daily.        . clonazePAM (KLONOPIN) 1 MG tablet Take 2 tablets (2 mg total) by mouth at bedtime as needed.  60 tablet  5  . desvenlafaxine (PRISTIQ) 50 MG 24 hr tablet Take 1 tablet (50 mg total) by mouth daily.  30 tablet  10  . doxepin (SINEQUAN) 25 MG capsule Take 25 mg by mouth 2 (two) times daily.      [name] gabapentin (NEURONTIN) 300 MG capsule Take 300 mg by mouth 3 (three) times daily.       [name] LINZESS 145 MCG CAPS capsule Take 1 capsule by mouth daily.      [name] lovastatin (MEVACOR) 40 MG tablet 1 tablet daily.      [name]  metoprolol succinate (TOPROL-XL) 25 MG 24 hr tablet Take 1 tablet by mouth daily.      [name] oxyCODONE-acetaminophen (PERCOCET) 5-325 MG per tablet Take 1 tablet by mouth every 6 (six) hours as needed.         No current facility-administered medications for this visit.    Lab Results: No results found for this or any previous visit (from the past 48 hour(s)).  Physical Findings: AIMS:  , ,  ,  ,    CIWA:    COWS:     Treatment Plan Summary: At this time the patient will continue taking Adderall X. are 30 mg taking 2 a day. She will also continue her doxepin 25 mg and her pritiq. This patient to return to see [place] in 3 months.  Plan:  Medical Decision Making Problem Points:  Established problem, stable/improving (1) Data Points:  Review of  medication regiment & side effects (2)  I certify that inpatient services furnished can reasonably be expected to improve the patient's condition.   Ryah Cribb IRVING 05/21/2013, 11:14 AM

## 2013-06-04 ENCOUNTER — Other Ambulatory Visit: Payer: Self-pay

## 2013-06-04 MED ORDER — GABAPENTIN 300 MG PO CAPS: 300.0000 mg | ORAL_CAPSULE | Freq: Every day | ORAL | Status: DC

## 2013-06-29 ENCOUNTER — Inpatient Hospital Stay: Admission: RE | Admit: 2013-06-29 | Payer: Self-pay | Source: Ambulatory Visit

## 2013-06-29 ENCOUNTER — Other Ambulatory Visit: Payer: Self-pay

## 2013-07-28 ENCOUNTER — Telehealth (HOSPITAL_COMMUNITY): Payer: Self-pay | Admitting: *Deleted

## 2013-07-28 DIAGNOSIS — F329 Major depressive disorder, single episode, unspecified: Secondary | ICD-10-CM

## 2013-07-28 MED ORDER — CLONAZEPAM 1 MG PO TABS: 3.0000 mg | ORAL_TABLET | Freq: Every evening | ORAL | Status: DC | PRN

## 2013-07-28 NOTE — Telephone Encounter (Signed)
Copied from previous phone message left at 07/28/13 @ 1326: "Pt left OO:ILNZVJ used to take Klonopin 3 mg at bedtime, now takes 2 mg.  Having increased leg pain-may be arthritis pinching a nerve.Will be having another MRI.  Klonopin helps with sleep. Not as helpful any more, helped more at 3 mg.  States pharmacy keeps changing generics - pills always different colors - not sure if generics changing affects how strong they are."  Dr .Casimiro Needle authorized klonopin 3mg  at [facility], #90, 2 refills. Called to patient pharmacy.

## 2013-07-28 NOTE — Telephone Encounter (Signed)
Pt left LV:[name] used to take Klonopin 3 mg at bedtime, now takes 2 mg. Having increased leg pain-may be arthritis pinching a nerve.Will be having another MRI. Klonopin helps with sleep. Not as helpful any more, helped more at 3 mg. States pharmacy keeps changing generics - pills always different colors - not sure if generics changing affects how strong they are.

## 2013-07-30 ENCOUNTER — Inpatient Hospital Stay: Admission: RE | Admit: 2013-07-30 | Payer: Self-pay | Source: Ambulatory Visit

## 2013-08-10 ENCOUNTER — Ambulatory Visit
Admission: RE | Admit: 2013-08-10 | Discharge: 2013-08-10 | Disposition: A | Discharge status: Home / Self Care | Payer: Medicare Other | Source: Ambulatory Visit | Attending: Diagnostic Neuroimaging | Admitting: Diagnostic Neuroimaging

## 2013-08-10 DIAGNOSIS — G35 Multiple sclerosis: Secondary | ICD-10-CM

## 2013-08-10 MED ORDER — GADOBENATE DIMEGLUMINE 529 MG/ML IV SOLN
10.0000 mL | Freq: Once | INTRAVENOUS | Status: AC | PRN
  Administered 2013-08-10: 10 mL via INTRAVENOUS

## 2013-09-02 ENCOUNTER — Encounter: Payer: Self-pay | Admitting: Diagnostic Neuroimaging

## 2013-09-02 ENCOUNTER — Ambulatory Visit (INDEPENDENT_AMBULATORY_CARE_PROVIDER_SITE_OTHER): Payer: Medicare Other | Admitting: Diagnostic Neuroimaging

## 2013-09-02 VITALS — BP 119/76 | HR 92 | Temp 97.1°F | Ht 61.0 in | Wt 120.5 lb

## 2013-09-02 DIAGNOSIS — G35 Multiple sclerosis: Secondary | ICD-10-CM

## 2013-09-02 NOTE — Patient Instructions (Signed)
Copaxone/Glatiramer injection What is this medicine? GLATIRAMER (gla TIR a mer) helps to decrease the number of multiple sclerosis relapses in people with relapsing-remitting forms of the disease. The medicine does not cure multiple sclerosis. This medicine may be used for other purposes; ask your health care provider or pharmacist if you have questions. COMMON BRAND NAME(S): Copaxone Patient Pack, Copaxone What should I tell my health care provider before I take this medicine? They need to know if you have any of these conditions: -immune system problems -infection -an unusual or allergic reaction to glatiramer, mannitol, other medicines, foods, dyes, or preservatives -pregnant or trying to get pregnant -breast-feeding How should I use this medicine? This medicine is for injection under the skin. You will be taught how to prepare and give this medicine. Use exactly as directed. Take your medicine at regular intervals. Do not take your medicine more often than directed. Do not stop taking except on your doctor's advice. It is important that you put your used needles and syringes in a special sharps container. Do not put them in a trash can. If you do not have a sharps container, call your pharmacist or healthcare provider to get one. Talk to your pediatrician regarding the use of this medicine in children. Special care may be needed. Overdosage: If you think you have taken too much of this medicine contact a poison control center or emergency room at once. NOTE: This medicine is only for you. Do not share this medicine with others. What if I miss a dose? If you miss a dose, take it as soon as you can. If it is almost time for your next dose, take only that dose. Do not take double or extra doses. What may interact with this medicine? Interactions are not expected. This list may not describe all possible interactions. Give your health care provider a list of all the medicines, herbs,  non-prescription drugs, or dietary supplements you use. Also tell them if you smoke, drink alcohol, or use illegal drugs. Some items may interact with your medicine. What should I watch for while using this medicine? Visit your doctor or health care professional for regular checks on your progress. What side effects may I notice from receiving this medicine? Side effects that you should report to your doctor or health care professional as soon as possible: -allergic reactions like skin rash, itching or hives, swelling of the face, lips, or tongue -chest pain or tightness -difficulty breathing -fever, chills, or any other sign of infection -rapid heartbeat or palpitations -severe pain at the injection site -swelling of the ankles Side effects that usually do not require medical attention (report to your doctor or health care professional if they continue or are bothersome): -anxiety -dizziness -drowsiness -flushing -joint aches -nausea, vomiting -pain, redness, itching, or irritation at the injection site -tremor -weakness This list may not describe all possible side effects. Call your doctor for medical advice about side effects. You may report side effects to FDA at 1-800-FDA-1088. Where should I keep my medicine? Keep out of the reach of children. Store in a refrigerator between 2 and 8 degrees C (36 and 46 degrees F). An unused prefilled syringe may be stored for up to 7 days between 15 and 30 degrees C (59 and 86 degrees F). Do not freeze. Protect from light. Throw away any unused diluted injection. Throw away any unused medicine after the expiration date. NOTE: This sheet is a summary. It may not cover all possible information. If you  have questions about this medicine, talk to your doctor, pharmacist, or health care provider.  2014, [employer]vier/Gold Standard. (2008-05-03 10:21:43)
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
## 2013-09-03 ENCOUNTER — Telehealth: Payer: Self-pay | Admitting: Diagnostic Neuroimaging

## 2013-09-03 MED ORDER — GLATIRAMER ACETATE 40 MG/ML ~~LOC~~ SOSY: 40.0000 mg | PREFILLED_SYRINGE | SUBCUTANEOUS | Status: DC

## 2013-09-03 NOTE — Telephone Encounter (Signed)
This medication is typically sent via specialty pharmacy.  I called the patient, she would like to try to get it at the local pharmacy.  If they are not able to accommodate her, or if it is too costly she will call us back and let us know.

## 2013-09-03 NOTE — Telephone Encounter (Signed)
[name] w/[facility] called states pt wants her prescription Copaxone; 40mg  TIW dosing per Dr.Penumalli's note from previous visit.  This prescription needs to be sent to [facility] in [place] per Baker. Call pt when this has been transferred over. Thanks

## 2013-09-09 ENCOUNTER — Other Ambulatory Visit (HOSPITAL_COMMUNITY): Payer: Self-pay | Admitting: *Deleted

## 2013-09-09 DIAGNOSIS — F909 Attention-deficit hyperactivity disorder, unspecified type: Secondary | ICD-10-CM

## 2013-09-09 MED ORDER — AMPHETAMINE-DEXTROAMPHET ER 30 MG PO CP24: 60.0000 mg | ORAL_CAPSULE | Freq: Every day | ORAL | Status: DC

## 2013-09-22 ENCOUNTER — Ambulatory Visit (INDEPENDENT_AMBULATORY_CARE_PROVIDER_SITE_OTHER): Payer: Medicare Other | Admitting: Psychiatry

## 2013-09-22 VITALS — BP 132/91 | HR 88 | Ht 61.0 in | Wt 119.4 lb

## 2013-09-22 DIAGNOSIS — F329 Major depressive disorder, single episode, unspecified: Secondary | ICD-10-CM

## 2013-09-22 DIAGNOSIS — F909 Attention-deficit hyperactivity disorder, unspecified type: Secondary | ICD-10-CM

## 2013-09-22 DIAGNOSIS — F332 Major depressive disorder, recurrent severe without psychotic features: Secondary | ICD-10-CM

## 2013-09-22 MED ORDER — DESVENLAFAXINE SUCCINATE ER 50 MG PO TB24
50.0000 mg | ORAL_TABLET | Freq: Every day | ORAL | Status: DC
Start: 2013-09-22 — End: 2013-12-22

## 2013-09-22 MED ORDER — AMPHETAMINE-DEXTROAMPHET ER 30 MG PO CP24: ORAL_CAPSULE | ORAL | Status: DC

## 2013-09-22 MED ORDER — AMPHETAMINE-DEXTROAMPHET ER 30 MG PO CP24: 60.0000 mg | ORAL_CAPSULE | Freq: Every day | ORAL | Status: DC

## 2013-09-22 MED ORDER — CLONAZEPAM 1 MG PO TABS: 3.0000 mg | ORAL_TABLET | Freq: Every evening | ORAL | Status: DC | PRN

## 2013-09-22 MED ORDER — DOXEPIN HCL 25 MG PO CAPS: 25.0000 mg | ORAL_CAPSULE | Freq: Two times a day (BID) | ORAL | Status: DC

## 2013-09-22 NOTE — Progress Notes (Signed)
[facility] MD Progress Note  09/22/2013 1:54 PM [name]  MRN:  [id number] Subjective:  Not so great Today the patient says her mood is somewhat different. She says she's not as enthusiastic. But generally in a close evaluation the patient still is doing very well. She denies persistent daily depression. She describes some degree of sadness but is not clear with if that is really intrinsic to her mood state. The patient does have some stressors. The first is her scan demonstrated progressive multiple sclerosis. She is therefore starting back on the injections for MS. Further with a new neurologist she doesn't seem to be able to connect to. Just some concerns about feeling comfortable with him. The patient also has a nephew who is in financial distress and this bothers this patient. The patient herself is not although financially stable. She wishes she could help more people. The patient denies persistent daily paralyzed and depression. She is sleeping and eating well. She's got a fairly good amount of energy and she denies worthlessness. She enjoys the computer watching TV for the first time more and more and cooking. She also enjoys her dog. She claims she is thinking concentrating well. The patient actually denies any new neurological symptoms. Hence the neurologist is responding to a brain scan that has changed and gotten worse but according to the patient she has no neurological symptoms. The patient is not suicidal. She uses no alcohol. Generally she feels fairly healthy. Diagnosis:   DSM5: Schizophrenia Disorders:   Obsessive-Compulsive Disorders:   Trauma-Stressor Disorders:   Substance/Addictive Disorders:   Depressive Disorders:  Major Depressive Disorder - Mild (296.21) Total Time spent with patient: 30 minutes  Axis I: Major Depression, Recurrent severe  ADL's:  Intact  Sleep: Good  Appetite:  Good  Suicidal Ideation:  no Homicidal Ideation:  none AEB (as evidenced  by):  Psychiatric Specialty Exam: Physical Exam  ROS  Blood pressure 132/91, pulse 88, height 5\' 1"  (1.549 m), weight 119 lb 6.4 oz (54.159 kg).Body mass index is 22.57 kg/(m^2).  General Appearance: Casual  Eye Contact::  Good  Speech:  Normal Rate  Volume:  Normal  Mood:  Euthymic  Affect:  Appropriate  Thought Process:  Goal Directed  Orientation:  Full (Time, Place, and Person)  Thought Content:  WDL  Suicidal Thoughts:  No  Homicidal Thoughts:  No  Memory:  NA  Judgement:  Good  Insight:  Good  Psychomotor Activity:  Normal  Concentration:  Good  Recall:  Good  Fund of Knowledge:Good  Language: Good  Akathisia:  No  Handed:  Right  AIMS (if indicated):     Assets:  Communication Skills  Sleep:      Musculoskeletal: Strength & Muscle Tone:  Gait & Station:  Patient leans:   Current Medications: Current Outpatient Prescriptions  Medication Sig Dispense Refill  . albuterol (PROAIR HFA) 108 (90 BASE) MCG/ACT inhaler Inhale 2 puffs into the lungs every 6 (six) hours as needed.        [name] amphetamine-dextroamphetamine (ADDERALL XR) 30 MG 24 hr capsule Take 2 capsules (60 mg total) by mouth daily.  60 capsule  0  . amphetamine-dextroamphetamine (ADDERALL XR) 30 MG 24 hr capsule Refill after 10/15/2013  60 capsule  0  . amphetamine-dextroamphetamine (ADDERALL XR) 30 MG 24 hr capsule Refill 11/15/2013  60 capsule  0  . aspirin 325 MG tablet Take 325 mg by mouth daily.        . clonazePAM (KLONOPIN) 1 MG tablet  Take 3 tablets (3 mg total) by mouth at bedtime as needed.  90 tablet  2  . desvenlafaxine (PRISTIQ) 50 MG 24 hr tablet Take 1 tablet (50 mg total) by mouth daily.  30 tablet  10  . doxepin (SINEQUAN) 25 MG capsule Take 1 capsule (25 mg total) by mouth 2 (two) times daily.  30 capsule  5  . gabapentin (NEURONTIN) 300 MG capsule Take 1 capsule (300 mg total) by mouth daily.  30 capsule  12  . Glatiramer Acetate (COPAXONE) 40 MG/ML SOSY Inject 40 mg into the skin 3 (three)  times a week.  12 Syringe  12  . LINZESS 145 MCG CAPS capsule Take 1 capsule by mouth daily.      [name] lovastatin (MEVACOR) 40 MG tablet 1 tablet daily.      . metoprolol succinate (TOPROL-XL) 25 MG 24 hr tablet Take 1 tablet by mouth daily.      [name] oxyCODONE-acetaminophen (PERCOCET) 5-325 MG per tablet Take 1 tablet by mouth every 6 (six) hours as needed.         No current facility-administered medications for this visit.    Lab Results: No results found for this or any previous visit (from the past 48 hour(s)).  Physical Findings: AIMS:  , ,  ,  ,    CIWA:    COWS:     Treatment Plan Summary: At this time we will continue this patient's Adderall 30 mg 2 a day. 2 continue taking Pristeque 50 mg. The patient will continue taking doxepin 25 mg for sleep. The patient will continue taking Klonopin as prescribed. This patient will be seen back in 3 months. If she trans-down and starts feeling persistent depression I would go ahead and increase her antidepressant to 100 mg dose. At this time this patient has mildly declined but is not clinically significant enough to make an alteration/change in her medication. At this time I suggested the patient consider possibly changing to Dr. Bonney Roussel in [place]. The patient will consider this if necessary I will refer her.  Plan:  Medical Decision Making Problem Points:  Established problem, worsening (2) Data Points:  Review of medication regiment & side effects (2)  I certify that inpatient services furnished can reasonably be expected to improve the patient's condition.   [name] [facility] 09/22/2013, 1:54 PM

## 2013-10-20 ENCOUNTER — Ambulatory Visit: Payer: Self-pay | Admitting: Neurology

## 2013-10-27 ENCOUNTER — Telehealth (HOSPITAL_COMMUNITY): Payer: Self-pay | Admitting: *Deleted

## 2013-10-27 NOTE — Telephone Encounter (Signed)
Patient left VM:Saw medical MD.Told her still very tired-Adderall not helping as much. She recommended trial of Vyvanse if Dr. Casimiro Needle would prescribe it. She wants to know if he wants to do this

## 2013-11-03 NOTE — Telephone Encounter (Signed)
I spoke to patient about her request and we discussed that bypass would be no better to continue her present medications I

## 2013-11-11 ENCOUNTER — Encounter: Payer: Self-pay | Admitting: *Deleted

## 2013-11-16 ENCOUNTER — Encounter: Payer: Self-pay | Admitting: Neurology

## 2013-11-16 ENCOUNTER — Ambulatory Visit (INDEPENDENT_AMBULATORY_CARE_PROVIDER_SITE_OTHER): Payer: Medicare Other | Admitting: Neurology

## 2013-11-16 VITALS — BP 90/56 | HR 80 | Resp 16 | Ht 61.0 in | Wt 120.0 lb

## 2013-11-16 DIAGNOSIS — G35 Multiple sclerosis: Secondary | ICD-10-CM

## 2013-11-16 MED ORDER — MODAFINIL 200 MG PO TABS: 200.0000 mg | ORAL_TABLET | Freq: Every day | ORAL | Status: DC

## 2013-11-16 NOTE — Patient Instructions (Addendum)
1.  We'll keep you off Copaxone for now and see how you do over the next 6 months 2.  We will repeat MRI in 6 months. 3.  Continue the D3, gabapentin and Klonopin 4.  Continue cane and walker if needed. 5.  We will start Provigil 200mg  every morning.  Stop the Adderal once you start this. 6.  Follow up in 3 months. 7. Patient should continue with eye exams
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
## 2013-11-30 ENCOUNTER — Telehealth: Payer: Self-pay | Admitting: Neurology

## 2013-11-30 NOTE — Telephone Encounter (Signed)
Pt called, Dr. Tomi Likens gave Rx for Provigil. Does not like the generic, feels like it is not helping. Can we increase dose to see if that helps? CB# [phone] / Sherri S.

## 2013-12-01 NOTE — Telephone Encounter (Signed)
We can try 400mg  every morning.

## 2013-12-01 NOTE — Telephone Encounter (Signed)
Patient is aware that she can take the Provigil 400 mg daily  She is taking 200 mg and will take 2 today I will call in a new RX for the provigil

## 2013-12-01 NOTE — Telephone Encounter (Signed)
Please advise on below message.

## 2013-12-06 ENCOUNTER — Other Ambulatory Visit: Payer: Self-pay | Admitting: *Deleted

## 2013-12-06 MED ORDER — MODAFINIL 200 MG PO TABS: 400.0000 mg | ORAL_TABLET | Freq: Every day | ORAL | Status: DC

## 2013-12-06 MED ORDER — MODAFINIL 200 MG PO TABS: 200.0000 mg | ORAL_TABLET | Freq: Every day | ORAL | Status: DC

## 2013-12-08 ENCOUNTER — Telehealth: Payer: Self-pay | Admitting: *Deleted

## 2013-12-08 NOTE — Telephone Encounter (Signed)
Patient called stating that after taking the Provigil on 12/07/13 she had swelling of the tongue and it became very sore her daughter who is a EMT had her to take Benadryl and that seem to help . Today the same thing happen . I advised her to not take the medication at all . She then ask could she go back to her Adderall? Please advise

## 2013-12-09 NOTE — Telephone Encounter (Signed)
That will be fine.  I believe she takes Adderal XL 30mg  tabs.  She takes 2 tabs (60mg ) daily.

## 2013-12-09 NOTE — Telephone Encounter (Signed)
    Patient now states that she thinks it was mixing the Adderal with the provigil that has caused the reaction she says she did not have the tongue swelling until she added the Adderal back with week .

## 2013-12-09 NOTE — Telephone Encounter (Signed)
Patient now states that she thinks it was mixing the Adderal with the provigil that has caused the reaction she says she did not have the tongue swelling until she added the Adderal back with week .

## 2013-12-09 NOTE — Telephone Encounter (Signed)
See BELOW  Note

## 2013-12-09 NOTE — Telephone Encounter (Signed)
Disregard previous message. Per pt she is allergic to Peanuts and Walnuts. She ate 4 peanuts and she believes this is the problem. Sherri

## 2013-12-09 NOTE — Telephone Encounter (Signed)
If she was taking both medications at the same time, it will be difficult to tell if either medication is the cause, if at all.  She has been on both medications in the past anyway.  I would stop both provigil and Adderal and we can try amantadine 100mg  daily every morning instead.

## 2013-12-10 NOTE — Telephone Encounter (Signed)
Then she may continue Provigil (discontinue Adderal)

## 2013-12-13 NOTE — Telephone Encounter (Signed)
Patient states she had tongue swelling again after taking the provigil  She was advised to stop  I will call in amantadine 100mg  daily in am

## 2013-12-16 ENCOUNTER — Encounter: Payer: Self-pay | Admitting: Neurology

## 2013-12-22 ENCOUNTER — Ambulatory Visit (INDEPENDENT_AMBULATORY_CARE_PROVIDER_SITE_OTHER): Payer: Medicare Other | Admitting: Psychiatry

## 2013-12-22 VITALS — BP 147/87 | HR 93 | Ht 61.0 in | Wt 123.8 lb

## 2013-12-22 DIAGNOSIS — F332 Major depressive disorder, recurrent severe without psychotic features: Secondary | ICD-10-CM

## 2013-12-22 DIAGNOSIS — F909 Attention-deficit hyperactivity disorder, unspecified type: Secondary | ICD-10-CM

## 2013-12-22 DIAGNOSIS — F331 Major depressive disorder, recurrent, moderate: Secondary | ICD-10-CM

## 2013-12-22 MED ORDER — AMPHETAMINE-DEXTROAMPHET ER 30 MG PO CP24: ORAL_CAPSULE | ORAL | Status: DC

## 2013-12-22 MED ORDER — DESVENLAFAXINE SUCCINATE ER 50 MG PO TB24: 50.0000 mg | ORAL_TABLET | Freq: Every day | ORAL | Status: DC

## 2013-12-22 MED ORDER — DOXEPIN HCL 25 MG PO CAPS: 25.0000 mg | ORAL_CAPSULE | Freq: Two times a day (BID) | ORAL | Status: DC

## 2013-12-22 MED ORDER — AMPHETAMINE-DEXTROAMPHET ER 30 MG PO CP24: 60.0000 mg | ORAL_CAPSULE | Freq: Every day | ORAL | Status: DC
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
## 2014-01-12 ENCOUNTER — Other Ambulatory Visit: Payer: Self-pay | Admitting: Anesthesiology

## 2014-01-12 DIAGNOSIS — M542 Cervicalgia: Secondary | ICD-10-CM

## 2014-01-17 ENCOUNTER — Other Ambulatory Visit: Payer: Self-pay

## 2014-01-20 ENCOUNTER — Other Ambulatory Visit: Payer: Self-pay

## 2014-01-28 ENCOUNTER — Ambulatory Visit
Admission: RE | Admit: 2014-01-28 | Discharge: 2014-01-28 | Disposition: A | Discharge status: Home / Self Care | Payer: Medicare Other | Source: Ambulatory Visit | Attending: Anesthesiology | Admitting: Anesthesiology

## 2014-01-28 DIAGNOSIS — M542 Cervicalgia: Secondary | ICD-10-CM

## 2014-02-16 ENCOUNTER — Ambulatory Visit (INDEPENDENT_AMBULATORY_CARE_PROVIDER_SITE_OTHER): Payer: Medicare Other | Admitting: Neurology

## 2014-02-16 ENCOUNTER — Telehealth: Payer: Self-pay | Admitting: Neurology

## 2014-02-16 ENCOUNTER — Encounter: Payer: Self-pay | Admitting: Neurology

## 2014-02-16 VITALS — BP 126/78 | HR 78 | Temp 98.0°F | Resp 16 | Wt 121.4 lb

## 2014-02-16 DIAGNOSIS — G35 Multiple sclerosis: Secondary | ICD-10-CM

## 2014-02-16 NOTE — Patient Instructions (Addendum)
1.  We will switch the Copaxone to 20mg  daily 2.  Try the Provigil one last time.  If you have a reaction, call and we will have to stop it and change to something else. 3.  In 6 months (March 2016), I want to repeat MRI of the brain with and without contrast, with follow up with me soon after. 08/17/14 9:45 [facility]  4.  Check vitamin D level.

## 2014-02-16 NOTE — Telephone Encounter (Signed)
Pt called to give the name of the medication: NUCYNTAER 100MG 

## 2014-02-16 NOTE — Telephone Encounter (Signed)
Pt called back and says PCP would like Dr. Tomi Likens to call in Noma / magic mouthwash since he is the one who actually saw the pt. Pt CB# [phone] / Sherri S.
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
## 2014-02-17 LAB — VITAMIN D 25 HYDROXY (VIT D DEFICIENCY, FRACTURES): VIT D 25 HYDROXY: 56 ng/mL (ref 30–89)

## 2014-02-17 NOTE — Telephone Encounter (Signed)
Patient was advised to contact PCP for Dukes magic mouth wash

## 2014-03-03 ENCOUNTER — Other Ambulatory Visit (HOSPITAL_COMMUNITY): Payer: Self-pay | Admitting: *Deleted

## 2014-03-03 ENCOUNTER — Telehealth: Payer: Self-pay | Admitting: Neurology

## 2014-03-03 DIAGNOSIS — F331 Major depressive disorder, recurrent, moderate: Secondary | ICD-10-CM

## 2014-03-03 MED ORDER — CLONAZEPAM 1 MG PO TABS: 3.0000 mg | ORAL_TABLET | Freq: Every evening | ORAL | Status: DC | PRN

## 2014-03-03 NOTE — Telephone Encounter (Signed)
Patient left [id number] refill of clonazepam sent to [facility]. Was going to have other MD refill it, but has not asked. Per Dr. Casimiro Needle - he will refill.  Phoned patient - she requests prescription for Klonopin be called to [facility].Having difficulty at [facility] with generic meds not being the same each time.

## 2014-03-03 NOTE — Telephone Encounter (Signed)
After 20 minute conversation patient is aware that  Dr Tomi Likens will prescribe and refill  Only the medication he has prescribed for her and  That if a prior Josem Kaufmann is needed the Insurance and pharmacy would need to send form to be filled out for approval

## 2014-03-03 NOTE — Telephone Encounter (Signed)
Pt called requesting to speak to a nurse regarding her insurance. C/B [phone]

## 2014-03-04 ENCOUNTER — Ambulatory Visit: Payer: Medicare Other | Admitting: Nurse Practitioner

## 2014-04-11 ENCOUNTER — Telehealth: Payer: Self-pay | Admitting: Diagnostic Neuroimaging

## 2014-04-11 NOTE — Telephone Encounter (Signed)
Called patient to get more details about what type of letter needed. Patient wanted to let our office know that she is seeing a new neurologist and she is very very happy with them. Congratulated patient.  Advised not showing any discrepancies in Dr. Gladstone Lighter notes about whether she had MS or not, so not sure this type letter could be generated. Patient then begin to talk about doctor's in her past.  Advised patient would document call.

## 2014-04-11 NOTE — Telephone Encounter (Signed)
Patient requesting a letter written about how Dr. Leta Baptist thinks she may not have MS, please return call and advise, she wants this mailed to her.

## 2014-04-27 ENCOUNTER — Ambulatory Visit (INDEPENDENT_AMBULATORY_CARE_PROVIDER_SITE_OTHER): Payer: Medicare Other | Admitting: Psychiatry

## 2014-04-27 VITALS — BP 135/83 | HR 77 | Ht 61.0 in | Wt 121.0 lb

## 2014-04-27 DIAGNOSIS — F988 Other specified behavioral and emotional disorders with onset usually occurring in childhood and adolescence: Secondary | ICD-10-CM

## 2014-04-27 DIAGNOSIS — F332 Major depressive disorder, recurrent severe without psychotic features: Secondary | ICD-10-CM

## 2014-04-27 DIAGNOSIS — F331 Major depressive disorder, recurrent, moderate: Secondary | ICD-10-CM

## 2014-04-27 MED ORDER — AMPHETAMINE-DEXTROAMPHET ER 30 MG PO CP24: ORAL_CAPSULE | ORAL | Status: DC

## 2014-04-27 MED ORDER — DESVENLAFAXINE SUCCINATE ER 50 MG PO TB24: 50.0000 mg | ORAL_TABLET | Freq: Every day | ORAL | Status: DC

## 2014-04-27 MED ORDER — DOXEPIN HCL 25 MG PO CAPS: 25.0000 mg | ORAL_CAPSULE | Freq: Two times a day (BID) | ORAL | Status: DC

## 2014-04-27 NOTE — Progress Notes (Signed)
[facility] MD Progress Note  04/27/2014 3:08 PM JENIFFER CULLIVER  MRN:  [id number] Subjective:  Upset As best I can tell this patient's pharmacists confronted her telling her that her doctors were calling to find out if she was doctor shopping. I shared with her that I have never done this and never thought she was. Was very clear that she was taking Adderall as replacement for Provigil for her MS. Should significant problems with fatigue. Unfortunately she could not get the Provigil and Adderall seems to work well. She was taking 2. She barely felt so concerned that we were upset with her she tried just to stop the Adderall and had great problems. She got down to just taking one a day and that seems to be adequate. She has enough energy to do what she needs to do. Her mood is stable. The patient sent spent most of the session apologizing for any misunderstandings or misconceptions that she was searching for medications. I never believe this about this patient. She saw her neurologist and was begun back on her Klonopin and Neurontin. She's requested me to make contact with a neurologist which are probably will do. At this time the patient is stable. I will continue her Adderall but I will per her request to reduce it to taking just 1 pill a day. The patient will continue on all her other medicines. This patient she'll return to see me in 3 months Diagnosis:   DSM5: Schizophrenia Disorders:   Obsessive-Compulsive Disorders:   Trauma-Stressor Disorders:   Substance/Addictive Disorders:   Depressive Disorders:   Total Time spent with patient:   Axis I: Major Depression, Recurrent severe  ADL's:  Intact  Sleep: Good  Appetite:  Good  Suicidal Ideation:  no Homicidal Ideation:  none AEB (as evidenced by):  Psychiatric Specialty Exam: Physical Exam  ROS  Blood pressure 135/83, pulse 77, height 5\' 1"  (1.549 m), weight 121 lb (54.885 kg).Body mass index is 22.87 kg/(m^2).  General Appearance: Casual   Eye Contact::  Good  Speech:  Clear and Coherent  Volume:  Normal  Mood:  NA  Affect:  Congruent  Thought Process:  Goal Directed  Orientation:  Full (Time, Place, and Person)  Thought Content:  WDL  Suicidal Thoughts:  No  Homicidal Thoughts:  No  Memory:  NA  Judgement:  NA  Insight:  Good  Psychomotor Activity:  Normal  Concentration:  Good  Recall:  Good  Fund of Knowledge:Good  Language: Good  Akathisia:  No  Handed:  Right  AIMS (if indicated):     Assets:  Desire for Improvement  Sleep:      Musculoskeletal: Strength & Muscle Tone:  Gait & Station:  Patient leans:   Current Medications: Current Outpatient Prescriptions  Medication Sig Dispense Refill  . albuterol (PROAIR HFA) 108 (90 BASE) MCG/ACT inhaler Inhale 2 puffs into the lungs every 6 (six) hours as needed.      [name] amphetamine-dextroamphetamine (ADDERALL XR) 30 MG 24 hr capsule 1 q day fill after 1/ 15/2016 30 capsule 0  . aspirin 325 MG tablet Take 325 mg by mouth daily.      . clonazePAM (KLONOPIN) 1 MG tablet Take 3 tablets (3 mg total) by mouth at bedtime as needed. 90 tablet 0  . desvenlafaxine (PRISTIQ) 50 MG 24 hr tablet Take 1 tablet (50 mg total) by mouth daily. 30 tablet 10  . doxepin (SINEQUAN) 25 MG capsule Take 1 capsule (25 mg total) by mouth 2 (  two) times daily. 30 capsule 5  . gabapentin (NEURONTIN) 300 MG capsule Take 1 capsule (300 mg total) by mouth daily. 30 capsule 12  . Glatiramer Acetate (COPAXONE) 40 MG/ML SOSY Inject 40 mg into the skin 3 (three) times a week. 12 Syringe 12  . LINZESS 145 MCG CAPS capsule Take 1 capsule by mouth daily.    [name] lovastatin (MEVACOR) 40 MG tablet 1 tablet daily.    . metoprolol succinate (TOPROL-XL) 25 MG 24 hr tablet Take 1 tablet by mouth daily.    . modafinil (PROVIGIL) 200 MG tablet Take 1 tablet (200 mg total) by mouth daily. 30 tablet 5  . modafinil (PROVIGIL) 200 MG tablet Take 2 tablets (400 mg total) by mouth daily. Take 1 tablet by mouth  Daily  30 tablet 3  . oxyCODONE-acetaminophen (PERCOCET) 5-325 MG per tablet Take 1 tablet by mouth every 6 (six) hours as needed.       No current facility-administered medications for this visit.    Lab Results: No results found for this or any previous visit (from the past 48 hour(s)).  Physical Findings: AIMS:  , ,  ,  ,    CIWA:    COWS:     Treatment Plan Summary: At this time the patient will continue taking Adderall 30 mg 1 a day. She'll continue on her antidepressant and doxepin 25 mg. Again Klonopin and Neurontin will be prescribed by her neurologist this patient to return to see me in 3 months  Plan:  Medical Decision Making Problem Points:  Established problem, worsening (2) Data Points:  Review of medication regiment & side effects (2)  I certify that inpatient services furnished can reasonably be expected to improve the patient's condition.   Katlynn Naser IRVING 04/27/2014, 3:08 PM

## 2014-06-07 ENCOUNTER — Ambulatory Visit: Payer: Medicare Other | Admitting: Internal Medicine

## 2014-06-13 ENCOUNTER — Ambulatory Visit (INDEPENDENT_AMBULATORY_CARE_PROVIDER_SITE_OTHER): Payer: Medicare Other | Admitting: Internal Medicine

## 2014-06-13 ENCOUNTER — Encounter: Payer: Self-pay | Admitting: Internal Medicine

## 2014-06-13 ENCOUNTER — Other Ambulatory Visit: Payer: Self-pay | Admitting: Internal Medicine

## 2014-06-13 VITALS — BP 144/86 | HR 98 | Ht 61.0 in | Wt 122.4 lb

## 2014-06-13 DIAGNOSIS — F338 Other recurrent depressive disorders: Secondary | ICD-10-CM | POA: Insufficient documentation

## 2014-06-13 DIAGNOSIS — R911 Solitary pulmonary nodule: Secondary | ICD-10-CM

## 2014-06-13 DIAGNOSIS — R0600 Dyspnea, unspecified: Secondary | ICD-10-CM

## 2014-06-13 DIAGNOSIS — F17200 Nicotine dependence, unspecified, uncomplicated: Secondary | ICD-10-CM

## 2014-06-13 DIAGNOSIS — Z720 Tobacco use: Secondary | ICD-10-CM

## 2014-06-13 DIAGNOSIS — F39 Unspecified mood [affective] disorder: Secondary | ICD-10-CM

## 2014-06-13 NOTE — Patient Instructions (Addendum)
ICD-9-CM ICD-10-CM   1. Nodule of left lung 793.11 R91.1   2. Smoker 305.1 Z72.0   3. Dyspnea 786.09 R06.00   4. Seasonal affective disorder 296.99 F39     - do CT chest wo contrast - ILD protocol - try to quit even e-cig ; research shows that even this is harmful - continue albuterol as needed  - respect your desire to avoid alpha 1 , repeat pft breathing tests - talk to PCP [facility], NP about light therapy for seasonal changes in mood  Followup   - one year or sooner if needed
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
## 2014-06-21 ENCOUNTER — Telehealth (HOSPITAL_COMMUNITY): Payer: Self-pay

## 2014-06-22 ENCOUNTER — Inpatient Hospital Stay: Admission: RE | Admit: 2014-06-22 | Payer: Self-pay | Source: Ambulatory Visit

## 2014-06-22 NOTE — Telephone Encounter (Signed)
Called and left a message. But the message on her answering machine and she did not pickup. Insert call me back and leave the numbers she can be reached

## 2014-07-19 ENCOUNTER — Other Ambulatory Visit: Payer: Self-pay | Admitting: Gastroenterology

## 2014-07-19 DIAGNOSIS — R1310 Dysphagia, unspecified: Secondary | ICD-10-CM

## 2014-07-20 ENCOUNTER — Encounter (HOSPITAL_COMMUNITY): Payer: Self-pay | Admitting: Psychiatry

## 2014-07-20 ENCOUNTER — Ambulatory Visit (INDEPENDENT_AMBULATORY_CARE_PROVIDER_SITE_OTHER): Payer: Medicare Other | Admitting: Psychiatry

## 2014-07-20 VITALS — BP 133/85 | HR 88 | Ht 61.0 in | Wt 124.6 lb

## 2014-07-20 DIAGNOSIS — F329 Major depressive disorder, single episode, unspecified: Secondary | ICD-10-CM | POA: Diagnosis not present

## 2014-07-20 DIAGNOSIS — F988 Other specified behavioral and emotional disorders with onset usually occurring in childhood and adolescence: Secondary | ICD-10-CM

## 2014-07-20 MED ORDER — AMPHETAMINE-DEXTROAMPHET ER 30 MG PO CP24: 30.0000 mg | ORAL_CAPSULE | Freq: Every day | ORAL | Status: DC

## 2014-07-20 MED ORDER — AMPHETAMINE-DEXTROAMPHET ER 30 MG PO CP24: ORAL_CAPSULE | ORAL | Status: DC

## 2014-07-20 MED ORDER — DOXEPIN HCL 25 MG PO CAPS: 25.0000 mg | ORAL_CAPSULE | Freq: Two times a day (BID) | ORAL | Status: DC

## 2014-07-20 MED ORDER — BUPROPION HCL ER (XL) 150 MG PO TB24
ORAL_TABLET | ORAL | Status: DC
Start: 2014-07-20 — End: 2014-10-14
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
## 2014-07-26 ENCOUNTER — Other Ambulatory Visit: Payer: Self-pay

## 2014-08-03 ENCOUNTER — Other Ambulatory Visit: Payer: Self-pay

## 2014-08-09 ENCOUNTER — Other Ambulatory Visit: Payer: Self-pay

## 2014-08-15 ENCOUNTER — Ambulatory Visit
Admission: RE | Admit: 2014-08-15 | Discharge: 2014-08-15 | Disposition: A | Discharge status: Home / Self Care | Payer: Medicare Other | Source: Ambulatory Visit | Attending: Gastroenterology | Admitting: Gastroenterology

## 2014-08-15 DIAGNOSIS — R1310 Dysphagia, unspecified: Secondary | ICD-10-CM

## 2014-08-17 ENCOUNTER — Ambulatory Visit (HOSPITAL_COMMUNITY)
Admission: RE | Admit: 2014-08-17 | Discharge: 2014-08-17 | Disposition: A | Discharge status: Home / Self Care | Payer: Medicare Other | Source: Ambulatory Visit | Attending: Neurology | Admitting: Neurology

## 2014-08-17 DIAGNOSIS — J329 Chronic sinusitis, unspecified: Secondary | ICD-10-CM | POA: Insufficient documentation

## 2014-08-17 DIAGNOSIS — H469 Unspecified optic neuritis: Secondary | ICD-10-CM | POA: Diagnosis not present

## 2014-08-17 DIAGNOSIS — G35 Multiple sclerosis: Secondary | ICD-10-CM | POA: Insufficient documentation

## 2014-08-17 LAB — CREATININE, SERUM
Creatinine, Ser: 0.95 mg/dL (ref 0.50–1.10)
GFR, EST AFRICAN AMERICAN: 69 mL/min — AB (ref >90)
GFR, EST NON AFRICAN AMERICAN: 60 mL/min — AB (ref >90)

## 2014-08-17 MED ORDER — GADOBENATE DIMEGLUMINE 529 MG/ML IV SOLN
10.0000 mL | Freq: Once | INTRAVENOUS | Status: AC
Start: 2014-08-17 — End: 2014-08-17
  Administered 2014-08-17: 10 mL via INTRAVENOUS

## 2014-08-18 ENCOUNTER — Telehealth: Payer: Self-pay | Admitting: *Deleted

## 2014-08-18 NOTE — Telephone Encounter (Signed)
-----   Message from Pieter Partridge, DO sent at 08/17/2014  4:39 PM EDT ----- Luna Kitchens of brain looks stable ----- Message -----    From: Rad Results In Interface    Sent: 08/17/2014   1:22 PM      To: Pieter Partridge, DO

## 2014-08-18 NOTE — Telephone Encounter (Signed)
Patient is aware of stable MRI Brain

## 2014-08-22 ENCOUNTER — Encounter: Payer: Self-pay | Admitting: Neurology

## 2014-08-22 ENCOUNTER — Ambulatory Visit (INDEPENDENT_AMBULATORY_CARE_PROVIDER_SITE_OTHER): Payer: Medicare Other | Admitting: Neurology

## 2014-08-22 ENCOUNTER — Telehealth: Payer: Self-pay | Admitting: *Deleted

## 2014-08-22 VITALS — BP 98/66 | HR 88 | Temp 98.0°F | Resp 18 | Ht 61.0 in | Wt 125.7 lb

## 2014-08-22 DIAGNOSIS — G35 Multiple sclerosis: Secondary | ICD-10-CM
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
## 2014-08-22 NOTE — Patient Instructions (Addendum)
1.  Take Copaxone 20mg  subcutaneously daily.  You must take this as prescribed. 2.  As long as you are on Adderal, I cannot prescribe you Provigil 3.  Follow up in 6 months.

## 2014-08-22 NOTE — Telephone Encounter (Signed)
nucynta er 50 mg patient state she was in this morning and needed to call back with the name of this medication Call back [phone]

## 2014-08-23 NOTE — Telephone Encounter (Signed)
In the system for medications

## 2014-08-25 ENCOUNTER — Telehealth: Payer: Self-pay | Admitting: Neurology

## 2014-08-25 NOTE — Telephone Encounter (Signed)
All of patients medication have been taken care of  They were sent to the [place] the Copaxone 20 mg had to be ordered I explained that to her on 08/22/14 and the Neurontin is waiting at pharmacy she did not appear angry at all

## 2014-08-25 NOTE — Telephone Encounter (Signed)
Pt called wanting to speak with a nurse regarding her meds.  Pt stated the [place] has not been called in to her pharmacy. Pt stated that Dr. Tomi Likens was going to Rx them and the pharmacy has not received the script. Please call pt to inform them that they have called them in. Pt is upset and stated that if Dr. Tomi Likens is not going to Rx her those script then why does she need to see a neurologist. Pharmacy: [facility] [phone]

## 2014-09-09 ENCOUNTER — Telehealth (HOSPITAL_COMMUNITY): Payer: Self-pay

## 2014-09-09 DIAGNOSIS — F988 Other specified behavioral and emotional disorders with onset usually occurring in childhood and adolescence: Secondary | ICD-10-CM

## 2014-09-09 MED ORDER — AMPHETAMINE-DEXTROAMPHET ER 30 MG PO CP24: ORAL_CAPSULE | ORAL | Status: DC

## 2014-09-09 NOTE — Telephone Encounter (Signed)
Patient called with concern about what dosage of Adderall XR she should be taking now.  Says she recently stopped all Adderall but no Dr. Tomi Likens had moved and they would no longer give her Provigil.  States she is sleeping 15 hours per day and questions if should return to 2 Adderall XR 30mg  per day.  Says did well with Provigil but now that she cannot get this would like to return to Adderall which she started back 2 weeks ago and now thinks may need more.  Patient would like some instruction as to what to take now and if you would like to return to 2 Adderall XR 30mg  per day?

## 2014-09-09 NOTE — Telephone Encounter (Signed)
Met with Dr. Casimiro Needle to discuss message from patient.  Dr. Casimiro Needle agreed with patient's request to return to past dosage of Adderall XR 33m 2 per day.  Informed of note from Dr. JTomi Likensfrom 08/22/14 visit with patient that Dr. JTomi Likenswould not give patient Provigil as long as Dr. PCasimiro Needlegiving Adderall.  Dr. PCasimiro Needlerequested this be noted and to go ahead with increased dosage of Adderal XR 310m2 per day but not to order Provigil in future as long as patient on Adderall.  New order printed and signed by Dr. PlCasimiro Needle Called to inform patient and got her husband, BeFulton Reekho reported patient had left to go out of town to see her daughter.  Mr. VePalltated he would give Mrs. VeDagleyhe message upon her return to pick up new order and to call if any questions.

## 2014-09-17 ENCOUNTER — Emergency Department (HOSPITAL_COMMUNITY)
Admission: EM | Admit: 2014-09-17 | Discharge: 2014-09-17 | Disposition: A | Discharge status: Home / Self Care | Payer: Medicare Other | Attending: Emergency Medicine | Admitting: Emergency Medicine

## 2014-09-17 ENCOUNTER — Encounter (HOSPITAL_COMMUNITY): Payer: Self-pay | Admitting: Emergency Medicine

## 2014-09-17 ENCOUNTER — Emergency Department (HOSPITAL_COMMUNITY): Payer: Medicare Other

## 2014-09-17 DIAGNOSIS — E785 Hyperlipidemia, unspecified: Secondary | ICD-10-CM | POA: Diagnosis not present

## 2014-09-17 DIAGNOSIS — R000 Tachycardia, unspecified: Secondary | ICD-10-CM | POA: Diagnosis not present

## 2014-09-17 DIAGNOSIS — Z86010 Personal history of colonic polyps: Secondary | ICD-10-CM | POA: Diagnosis not present

## 2014-09-17 DIAGNOSIS — Z87891 Personal history of nicotine dependence: Secondary | ICD-10-CM | POA: Insufficient documentation

## 2014-09-17 DIAGNOSIS — Z8719 Personal history of other diseases of the digestive system: Secondary | ICD-10-CM | POA: Insufficient documentation

## 2014-09-17 DIAGNOSIS — Z8739 Personal history of other diseases of the musculoskeletal system and connective tissue: Secondary | ICD-10-CM | POA: Insufficient documentation

## 2014-09-17 DIAGNOSIS — F419 Anxiety disorder, unspecified: Secondary | ICD-10-CM | POA: Insufficient documentation

## 2014-09-17 DIAGNOSIS — Z79899 Other long term (current) drug therapy: Secondary | ICD-10-CM | POA: Insufficient documentation

## 2014-09-17 DIAGNOSIS — Z8679 Personal history of other diseases of the circulatory system: Secondary | ICD-10-CM | POA: Insufficient documentation

## 2014-09-17 DIAGNOSIS — Z8669 Personal history of other diseases of the nervous system and sense organs: Secondary | ICD-10-CM | POA: Insufficient documentation

## 2014-09-17 DIAGNOSIS — R509 Fever, unspecified: Secondary | ICD-10-CM | POA: Diagnosis not present

## 2014-09-17 DIAGNOSIS — R52 Pain, unspecified: Secondary | ICD-10-CM | POA: Insufficient documentation

## 2014-09-17 DIAGNOSIS — F329 Major depressive disorder, single episode, unspecified: Secondary | ICD-10-CM | POA: Diagnosis not present

## 2014-09-17 DIAGNOSIS — Z8619 Personal history of other infectious and parasitic diseases: Secondary | ICD-10-CM | POA: Insufficient documentation

## 2014-09-17 LAB — CBC WITH DIFFERENTIAL/PLATELET
BASOS ABS: 0 10*3/uL (ref 0.0–0.1)
Basophils Relative: 0 % (ref 0–1)
EOS PCT: 2 % (ref 0–5)
Eosinophils Absolute: 0.2 10*3/uL (ref 0.0–0.7)
HCT: 37.9 % (ref 36.0–46.0)
Hemoglobin: 12.6 g/dL (ref 12.0–15.0)
LYMPHS PCT: 23 % (ref 12–46)
Lymphs Abs: 2 10*3/uL (ref 0.7–4.0)
MCH: 30.1 pg (ref 26.0–34.0)
MCHC: 33.2 g/dL (ref 30.0–36.0)
MCV: 90.7 fL (ref 78.0–100.0)
Monocytes Absolute: 0.6 10*3/uL (ref 0.1–1.0)
Monocytes Relative: 7 % (ref 3–12)
Neutro Abs: 6.1 10*3/uL (ref 1.7–7.7)
Neutrophils Relative %: 68 % (ref 43–77)
Platelets: 246 10*3/uL (ref 150–400)
RBC: 4.18 MIL/uL (ref 3.87–5.11)
RDW: 13.2 % (ref 11.5–15.5)
WBC: 8.9 10*3/uL (ref 4.0–10.5)

## 2014-09-17 LAB — URINALYSIS, ROUTINE W REFLEX MICROSCOPIC
Bilirubin Urine: NEGATIVE
Glucose, UA: NEGATIVE mg/dL
KETONES UR: NEGATIVE mg/dL
LEUKOCYTES UA: NEGATIVE
NITRITE: NEGATIVE
PROTEIN: NEGATIVE mg/dL
Specific Gravity, Urine: 1.007 (ref 1.005–1.030)
UROBILINOGEN UA: 0.2 mg/dL (ref 0.0–1.0)
pH: 6 (ref 5.0–8.0)

## 2014-09-17 LAB — BASIC METABOLIC PANEL
ANION GAP: 7 (ref 5–15)
BUN: 13 mg/dL (ref 6–23)
CHLORIDE: 101 mmol/L (ref 96–112)
CO2: 26 mmol/L (ref 19–32)
CREATININE: 0.9 mg/dL (ref 0.50–1.10)
Calcium: 8.8 mg/dL (ref 8.4–10.5)
GFR calc non Af Amer: 64 mL/min — ABNORMAL LOW (ref >90)
GFR, EST AFRICAN AMERICAN: 74 mL/min — AB (ref >90)
Glucose, Bld: 101 mg/dL — ABNORMAL HIGH (ref 70–99)
Potassium: 3.9 mmol/L (ref 3.5–5.1)
SODIUM: 134 mmol/L — AB (ref 135–145)

## 2014-09-17 LAB — URINE MICROSCOPIC-ADD ON

## 2014-09-17 MED ORDER — SODIUM CHLORIDE 0.9 % IV BOLUS (SEPSIS)
1000.0000 mL | Freq: Once | INTRAVENOUS | Status: AC
  Administered 2014-09-17: 1000 mL via INTRAVENOUS
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
## 2014-09-17 NOTE — ED Notes (Signed)
Patient transported to X-ray 

## 2014-09-17 NOTE — Discharge Instructions (Signed)
Musculoskeletal Pain Follow up with your primary care physician. Take tylenol for pain.  Musculoskeletal pain is muscle and boney aches and pains. These pains can occur in any part of the body. Your caregiver may treat you without knowing the cause of the pain. They may treat you if blood or urine tests, X-rays, and other tests were normal.  CAUSES There is often not a definite cause or reason for these pains. These pains may be caused by a type of germ (virus). The discomfort may also come from overuse. Overuse includes working out too hard when your body is not fit. Boney aches also come from weather changes. Bone is sensitive to atmospheric pressure changes. HOME CARE INSTRUCTIONS   Ask when your test results will be ready. Make sure you get your test results.  Only take over-the-counter or prescription medicines for pain, discomfort, or fever as directed by your caregiver. If you were given medications for your condition, do not drive, operate machinery or power tools, or sign legal documents for 24 hours. Do not drink alcohol. Do not take sleeping pills or other medications that may interfere with treatment.  Continue all activities unless the activities cause more pain. When the pain lessens, slowly resume normal activities. Gradually increase the intensity and duration of the activities or exercise.  During periods of severe pain, bed rest may be helpful. Lay or sit in any position that is comfortable.  Putting ice on the injured area.  Put ice in a bag.  Place a towel between your skin and the bag.  Leave the ice on for 15 to 20 minutes, 3 to 4 times a day.  Follow up with your caregiver for continued problems and no reason can be found for the pain. If the pain becomes worse or does not go away, it may be necessary to repeat tests or do additional testing. Your caregiver may need to look further for a possible cause. SEEK IMMEDIATE MEDICAL CARE IF:  You have pain that is getting  worse and is not relieved by medications.  You develop chest pain that is associated with shortness or breath, sweating, feeling sick to your stomach (nauseous), or throw up (vomit).  Your pain becomes localized to the abdomen.  You develop any new symptoms that seem different or that concern you. MAKE SURE YOU:   Understand these instructions.  Will watch your condition.  Will get help right away if you are not doing well or get worse. Document Released: 05/20/2005 Document Revised: 08/12/2011 Document Reviewed: 01/22/2013 [employer] Patient Information 2015 [place], [place]. This information is not intended to replace advice given to you by your health care provider. Make sure you discuss any questions you have with your health care provider.

## 2014-09-17 NOTE — ED Notes (Addendum)
Pt from home c/o fever and body aches since waking this a.m. She reports fevers as high as 100.9 and a very little productive cough.  Pt reports visiting some friends in hospital over the past several days.Very talkative in triage.

## 2014-09-17 NOTE — ED Provider Notes (Signed)
Medical screening examination/treatment/procedure(s) were conducted as a shared visit with non-physician practitioner(s) and myself.  I personally evaluated the patient during the encounter.  Pt presents with diffuse body aches and cough.  Afebrile here. Vitals stable.  Will plan on labs, cxr.  Possibly viral illness vs PNA.  Dorie Rank, MD 09/17/14 [phone]

## 2014-09-20 ENCOUNTER — Telehealth (HOSPITAL_COMMUNITY): Payer: Self-pay

## 2014-09-20 NOTE — Telephone Encounter (Signed)
[name], husband picked up prescription on 09/20/14 [place] [id number]  dlo

## 2014-10-14 ENCOUNTER — Encounter (HOSPITAL_COMMUNITY): Payer: Self-pay | Admitting: Psychiatry

## 2014-10-14 ENCOUNTER — Ambulatory Visit (INDEPENDENT_AMBULATORY_CARE_PROVIDER_SITE_OTHER): Payer: Medicare Other | Admitting: Psychiatry

## 2014-10-14 VITALS — BP 110/72 | HR 92 | Ht 61.0 in | Wt 124.6 lb

## 2014-10-14 DIAGNOSIS — F909 Attention-deficit hyperactivity disorder, unspecified type: Secondary | ICD-10-CM

## 2014-10-14 DIAGNOSIS — F988 Other specified behavioral and emotional disorders with onset usually occurring in childhood and adolescence: Secondary | ICD-10-CM | POA: Insufficient documentation

## 2014-10-14 MED ORDER — DOXEPIN HCL 25 MG PO CAPS: 25.0000 mg | ORAL_CAPSULE | Freq: Every day | ORAL | Status: DC

## 2014-10-14 MED ORDER — AMPHETAMINE-DEXTROAMPHET ER 30 MG PO CP24: ORAL_CAPSULE | ORAL | Status: DC
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
## 2014-11-16 ENCOUNTER — Telehealth (HOSPITAL_COMMUNITY): Payer: Self-pay

## 2014-11-16 NOTE — Telephone Encounter (Signed)
Telephone call with patient stating she was suppose to be getting Adderall XR '30mg'$ , 2 a day now but only had 30 dispensed to her from her pharmacy.  Verified with patient her current orders from Dr. Casimiro Needle were for 2 a day and requested patient follow up with her pharmacy as prescriptions for 10/14/14, 11/06/14 and 12/06/14 were for 2 a day and #60.  Patient concerned they may have gone by a previous prescription and agreed to follow up with pharmacy to see if could be corrected.  Patient to call back if anything we can do from this office.

## 2014-11-17 ENCOUNTER — Ambulatory Visit (HOSPITAL_COMMUNITY): Payer: Self-pay | Admitting: Psychiatry

## 2014-11-21 ENCOUNTER — Ambulatory Visit (HOSPITAL_COMMUNITY): Admission: RE | Admit: 2014-11-21 | Payer: Medicare Other | Source: Ambulatory Visit | Admitting: Gastroenterology

## 2014-11-21 ENCOUNTER — Encounter (HOSPITAL_COMMUNITY): Admission: RE | Payer: Self-pay | Source: Ambulatory Visit

## 2014-11-21 SURGERY — MANOMETRY, ESOPHAGUS

## 2014-12-26 ENCOUNTER — Other Ambulatory Visit: Payer: Self-pay | Admitting: *Deleted

## 2014-12-26 MED ORDER — GABAPENTIN 300 MG PO CAPS: 300.0000 mg | ORAL_CAPSULE | Freq: Every day | ORAL | Status: DC

## 2015-01-04 ENCOUNTER — Ambulatory Visit (INDEPENDENT_AMBULATORY_CARE_PROVIDER_SITE_OTHER): Payer: Medicare Other | Admitting: Psychiatry

## 2015-01-04 VITALS — BP 116/74 | HR 84 | Ht 61.0 in | Wt 127.0 lb

## 2015-01-04 DIAGNOSIS — F909 Attention-deficit hyperactivity disorder, unspecified type: Secondary | ICD-10-CM | POA: Diagnosis not present

## 2015-01-04 DIAGNOSIS — F988 Other specified behavioral and emotional disorders with onset usually occurring in childhood and adolescence: Secondary | ICD-10-CM

## 2015-01-04 MED ORDER — AMPHETAMINE-DEXTROAMPHET ER 30 MG PO CP24: ORAL_CAPSULE | ORAL | Status: DC

## 2015-01-04 MED ORDER — DOXEPIN HCL 25 MG PO CAPS: 25.0000 mg | ORAL_CAPSULE | Freq: Every day | ORAL | Status: AC
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
## 2015-02-22 ENCOUNTER — Ambulatory Visit (INDEPENDENT_AMBULATORY_CARE_PROVIDER_SITE_OTHER): Payer: Medicare Other | Admitting: Neurology

## 2015-02-22 ENCOUNTER — Encounter: Payer: Self-pay | Admitting: Neurology

## 2015-02-22 VITALS — BP 108/60 | HR 81 | Temp 98.1°F | Resp 16 | Ht 61.0 in | Wt 124.8 lb

## 2015-02-22 DIAGNOSIS — H8110 Benign paroxysmal vertigo, unspecified ear: Secondary | ICD-10-CM | POA: Diagnosis not present

## 2015-02-22 DIAGNOSIS — G35 Multiple sclerosis: Secondary | ICD-10-CM

## 2015-02-22 NOTE — Patient Instructions (Addendum)
Continue Copaxone, gabapentin and vitamin D Will check another vitamin D level If dizziness does not improve, call us and we can refer you to vestibular rehabilitation Follow up in 6 months.
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
## 2015-03-29 ENCOUNTER — Telehealth (HOSPITAL_COMMUNITY): Payer: Self-pay

## 2015-03-30 NOTE — Telephone Encounter (Signed)
Error

## 2015-04-12 ENCOUNTER — Ambulatory Visit (HOSPITAL_COMMUNITY): Payer: Self-pay | Admitting: Psychiatry

## 2015-05-08 ENCOUNTER — Observation Stay (HOSPITAL_COMMUNITY)
Admission: EM | Admit: 2015-05-08 | Discharge: 2015-05-09 | Disposition: A | Discharge status: Home / Self Care | Payer: Medicare Other | Attending: Internal Medicine | Admitting: Internal Medicine

## 2015-05-08 ENCOUNTER — Emergency Department (HOSPITAL_COMMUNITY): Payer: Medicare Other

## 2015-05-08 ENCOUNTER — Encounter (HOSPITAL_COMMUNITY): Payer: Self-pay | Admitting: Emergency Medicine

## 2015-05-08 ENCOUNTER — Telehealth: Payer: Self-pay | Admitting: Neurology

## 2015-05-08 DIAGNOSIS — E119 Type 2 diabetes mellitus without complications: Secondary | ICD-10-CM | POA: Diagnosis not present

## 2015-05-08 DIAGNOSIS — R32 Unspecified urinary incontinence: Secondary | ICD-10-CM | POA: Diagnosis present

## 2015-05-08 DIAGNOSIS — I252 Old myocardial infarction: Secondary | ICD-10-CM | POA: Insufficient documentation

## 2015-05-08 DIAGNOSIS — R200 Anesthesia of skin: Secondary | ICD-10-CM | POA: Diagnosis present

## 2015-05-08 DIAGNOSIS — I2510 Atherosclerotic heart disease of native coronary artery without angina pectoris: Secondary | ICD-10-CM | POA: Diagnosis not present

## 2015-05-08 DIAGNOSIS — R29810 Facial weakness: Secondary | ICD-10-CM | POA: Insufficient documentation

## 2015-05-08 DIAGNOSIS — G35 Multiple sclerosis: Secondary | ICD-10-CM | POA: Diagnosis not present

## 2015-05-08 DIAGNOSIS — F419 Anxiety disorder, unspecified: Secondary | ICD-10-CM | POA: Diagnosis not present

## 2015-05-08 DIAGNOSIS — F332 Major depressive disorder, recurrent severe without psychotic features: Secondary | ICD-10-CM | POA: Diagnosis present

## 2015-05-08 DIAGNOSIS — M810 Age-related osteoporosis without current pathological fracture: Secondary | ICD-10-CM | POA: Insufficient documentation

## 2015-05-08 DIAGNOSIS — Z79899 Other long term (current) drug therapy: Secondary | ICD-10-CM | POA: Insufficient documentation

## 2015-05-08 DIAGNOSIS — I6523 Occlusion and stenosis of bilateral carotid arteries: Secondary | ICD-10-CM | POA: Insufficient documentation

## 2015-05-08 DIAGNOSIS — M1990 Unspecified osteoarthritis, unspecified site: Secondary | ICD-10-CM | POA: Diagnosis not present

## 2015-05-08 DIAGNOSIS — R1310 Dysphagia, unspecified: Secondary | ICD-10-CM | POA: Insufficient documentation

## 2015-05-08 DIAGNOSIS — R202 Paresthesia of skin: Secondary | ICD-10-CM | POA: Insufficient documentation

## 2015-05-08 DIAGNOSIS — E871 Hypo-osmolality and hyponatremia: Secondary | ICD-10-CM | POA: Insufficient documentation

## 2015-05-08 DIAGNOSIS — E785 Hyperlipidemia, unspecified: Secondary | ICD-10-CM | POA: Diagnosis not present

## 2015-05-08 DIAGNOSIS — Z7982 Long term (current) use of aspirin: Secondary | ICD-10-CM | POA: Insufficient documentation

## 2015-05-08 DIAGNOSIS — G459 Transient cerebral ischemic attack, unspecified: Secondary | ICD-10-CM | POA: Diagnosis not present

## 2015-05-08 DIAGNOSIS — I447 Left bundle-branch block, unspecified: Secondary | ICD-10-CM | POA: Diagnosis not present

## 2015-05-08 DIAGNOSIS — Z87891 Personal history of nicotine dependence: Secondary | ICD-10-CM | POA: Insufficient documentation

## 2015-05-08 LAB — CBC WITH DIFFERENTIAL/PLATELET
Basophils Absolute: 0 10*3/uL (ref 0.0–0.1)
Basophils Relative: 0 %
Eosinophils Absolute: 0.4 10*3/uL (ref 0.0–0.7)
Eosinophils Relative: 5 %
HEMATOCRIT: 35.8 % — AB (ref 36.0–46.0)
Hemoglobin: 12.1 g/dL (ref 12.0–15.0)
LYMPHS PCT: 26 %
Lymphs Abs: 1.8 10*3/uL (ref 0.7–4.0)
MCH: 30 pg (ref 26.0–34.0)
MCHC: 33.8 g/dL (ref 30.0–36.0)
MCV: 88.8 fL (ref 78.0–100.0)
MONO ABS: 0.5 10*3/uL (ref 0.1–1.0)
MONOS PCT: 8 %
NEUTROS ABS: 4.1 10*3/uL (ref 1.7–7.7)
Neutrophils Relative %: 61 %
Platelets: 265 10*3/uL (ref 150–400)
RBC: 4.03 MIL/uL (ref 3.87–5.11)
RDW: 12.6 % (ref 11.5–15.5)
WBC: 6.8 10*3/uL (ref 4.0–10.5)

## 2015-05-08 LAB — BASIC METABOLIC PANEL
ANION GAP: 7 (ref 5–15)
BUN: 16 mg/dL (ref 6–20)
CO2: 28 mmol/L (ref 22–32)
Calcium: 8.9 mg/dL (ref 8.9–10.3)
Chloride: 95 mmol/L — ABNORMAL LOW (ref 101–111)
Creatinine, Ser: 0.62 mg/dL (ref 0.44–1.00)
GFR calc Af Amer: >60 mL/min (ref >60)
GFR calc non Af Amer: >60 mL/min (ref >60)
GLUCOSE: 92 mg/dL (ref 65–99)
POTASSIUM: 4.1 mmol/L (ref 3.5–5.1)
Sodium: 130 mmol/L — ABNORMAL LOW (ref 135–145)

## 2015-05-08 MED ORDER — ARIPIPRAZOLE 2 MG PO TABS
1.0000 mg | ORAL_TABLET | Freq: Every day | ORAL | Status: DC
  Filled 2015-05-08 (×2): qty 1

## 2015-05-08 MED ORDER — ENOXAPARIN SODIUM 40 MG/0.4ML ~~LOC~~ SOLN
40.0000 mg | SUBCUTANEOUS | Status: DC
  Administered 2015-05-08: 40 mg via SUBCUTANEOUS
  Filled 2015-05-08 (×2): qty 0.4

## 2015-05-08 MED ORDER — STROKE: EARLY STAGES OF RECOVERY BOOK
Freq: Once | Status: DC
  Filled 2015-05-08: qty 1

## 2015-05-08 MED ORDER — ALBUTEROL SULFATE HFA 108 (90 BASE) MCG/ACT IN AERS: 2.0000 | INHALATION_SPRAY | Freq: Four times a day (QID) | RESPIRATORY_TRACT | Status: DC | PRN

## 2015-05-08 MED ORDER — CLONAZEPAM 0.5 MG PO TABS: 2.0000 mg | ORAL_TABLET | Freq: Every evening | ORAL | Status: DC | PRN

## 2015-05-08 MED ORDER — CLONAZEPAM 1 MG PO TABS
2.0000 mg | ORAL_TABLET | Freq: Every evening | ORAL | Status: DC | PRN
  Administered 2015-05-08: 3 mg via ORAL
  Filled 2015-05-08: qty 3

## 2015-05-08 MED ORDER — ALBUTEROL SULFATE (2.5 MG/3ML) 0.083% IN NEBU: 2.5000 mg | INHALATION_SOLUTION | Freq: Four times a day (QID) | RESPIRATORY_TRACT | Status: DC | PRN

## 2015-05-08 MED ORDER — LORAZEPAM 2 MG/ML IJ SOLN: 1.0000 mg | Freq: Four times a day (QID) | INTRAMUSCULAR | Status: DC | PRN

## 2015-05-08 MED ORDER — GABAPENTIN 300 MG PO CAPS
300.0000 mg | ORAL_CAPSULE | Freq: Every day | ORAL | Status: DC
  Administered 2015-05-08: 300 mg via ORAL
  Filled 2015-05-08 (×2): qty 1

## 2015-05-08 MED ORDER — ASPIRIN EC 325 MG PO TBEC
325.0000 mg | DELAYED_RELEASE_TABLET | Freq: Every day | ORAL | Status: DC
Start: 2015-05-09 — End: 2015-05-09
  Administered 2015-05-09: 325 mg via ORAL
  Filled 2015-05-08: qty 1

## 2015-05-08 MED ORDER — SODIUM CHLORIDE 0.9 % IV SOLN
INTRAVENOUS | Status: DC
Start: 2015-05-08 — End: 2015-05-09
  Administered 2015-05-08: 23:00:00 via INTRAVENOUS

## 2015-05-08 NOTE — ED Notes (Signed)
Pt also states "my insurance wouldn't pay for the Provigil anymore so that's why I went on the adderall."  Pt reports issue with left side resolved stating "I'm never normal but I'm back to where I was."

## 2015-05-08 NOTE — ED Notes (Signed)
Patient transported to MRI 

## 2015-05-08 NOTE — Progress Notes (Signed)
Called by charge RN to talk with patient concerning complaints of "needing home medications and something to drink."  I talked to patient at the bedside.  Patient was tearful and jittery during conversation.  The patient expressed feeling anxious and frustration with her plan of care changing and her need to be in control of her care.  Patients complaints at this time are needing to understand plan of care, being able to drink and taking her home medications.  I called Tylene Fantasia to discuss patients plan of care and current orders.  Patient has orders for stroke work-up.  Baltazar Najjar requested I call and speak with admitting MD to discuss further.  I spoke with Dr Dreama Saa concerning patients plan of care and current orders.  We discussed if patient needs to be worked up for a stroke and NIH scale patient would need to transfer to [facility].  Dr Dreama Saa verbalized not working patient up for a stroke but for ? TIA or result of MS.  MD discontinued orders not needed from the order set.  Patient is too remain NPO except taking medicines with sips R/T possible aspiration.  I spoke with patient concerning risk of aspiration and remaining NPO.  Patient is receptive to not eating but patient verbalizes, "I will drink water, and if I aspirate that will be on me."  I have made patient aware of plan of care and patient is able to repeat back plan of care.  Patients RN informed of the above plan and to call if I'm needed.   Kandra Nicolas, [facility]
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
## 2015-05-08 NOTE — Telephone Encounter (Signed)
PT called and would like a call back in regards to her having TIA/Dawn CB# [phone] or [phone]

## 2015-05-08 NOTE — ED Notes (Signed)
Pt still in CT at this time.

## 2015-05-08 NOTE — ED Notes (Signed)
Swallow screen initiated on floor by transporting RN.

## 2015-05-08 NOTE — ED Notes (Addendum)
Pt reports tongue swelling and "feeling like I was having an allergic reaction" last night. Says, "the left side of my face felt like it was pulling down and it felt like my tongue was swelling only on one side." Took 3 Benadryl tablets last night. C/o intermittent bilateral arm burning. Pt is speaking full/clear sentences. Denies chest pain/SOB at this time. Took 325 mg Aspirin this morning for chest tightness. Ambulatory with steady gait. Moving arms with equal strength. Facial symmetry noted. Hx Multiple Sclerosis. No other c/c. Says s/s started mostly after she took her Prestique and the "coating came off in my mouth, then I started having trouble swallowing." No drooling noted. Says the left side of her face was drooping this morning but then "it went away and comes and goes."

## 2015-05-08 NOTE — Telephone Encounter (Signed)
Pt called to report possible TIA last night. States she was seen by PCP, Delia Chimes, this morning who encouraged her to call us for an MRI. Pt states since last night she has been having L facial numbness, L arm and L leg numbness, in addition to swallowing issues. BP at office this morning was 150/90. Please advise.
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
## 2015-05-08 NOTE — ED Notes (Signed)
Patient transported to CT 

## 2015-05-08 NOTE — Telephone Encounter (Signed)
Spoke with Dr. Tomi Likens. Patient was called back and encouraged to have someone to taker her to hospital. Pt will go to [place] to rule out stroke.

## 2015-05-08 NOTE — ED Provider Notes (Signed)
  Physical Exam  BP 144/82 mmHg  Pulse 76  Temp(Src) 97.4 F (36.3 C) (Oral)  Resp 20  SpO2 96%  Physical Exam  Patient was initially seen and examined by Will Dance.  ED Course  Procedures Patient was signed out to me by Will Dance PA-C who had called the hospitalist regarding admission. Patient had negative CT head. He spoke to Dr. Charlies Silvers who requested an MRI brain to rule out stroke. Her thought was that if the patient had a stroke, she would need to be transferred to [facility] versus being admitted to [facility].  She has a history of MS and came in for left-sided facial swelling, tongue swelling, trouble swallowing and facial numbness yesterday. All of her symptoms have since resolved. She also reports left hand numbness which has also resolved. MR brain was negative for any acute findings.   I spoke to Dr. Dreama Saa regarding need for admission due to neurological symptoms.  Patient was admitted to obs.       Ottie Glazier, PA-C 05/08/15 2247  Leo Grosser, MD 05/09/15 [phone]

## 2015-05-08 NOTE — ED Notes (Signed)
Hospitalist in room.

## 2015-05-09 ENCOUNTER — Observation Stay (HOSPITAL_BASED_OUTPATIENT_CLINIC_OR_DEPARTMENT_OTHER): Payer: Medicare Other

## 2015-05-09 DIAGNOSIS — E785 Hyperlipidemia, unspecified: Secondary | ICD-10-CM

## 2015-05-09 DIAGNOSIS — G459 Transient cerebral ischemic attack, unspecified: Secondary | ICD-10-CM

## 2015-05-09 DIAGNOSIS — G35 Multiple sclerosis: Secondary | ICD-10-CM

## 2015-05-09 DIAGNOSIS — R200 Anesthesia of skin: Secondary | ICD-10-CM | POA: Diagnosis not present

## 2015-05-09 DIAGNOSIS — I2510 Atherosclerotic heart disease of native coronary artery without angina pectoris: Secondary | ICD-10-CM

## 2015-05-09 HISTORY — DX: Atherosclerotic heart disease of native coronary artery without angina pectoris: I25.10

## 2015-05-09 LAB — LIPID PANEL
CHOL/HDL RATIO: 3.8 ratio
Cholesterol: 201 mg/dL — ABNORMAL HIGH (ref 0–200)
HDL: 53 mg/dL (ref >40)
LDL CALC: 127 mg/dL — AB (ref 0–99)
TRIGLYCERIDES: 104 mg/dL (ref <150)
VLDL: 21 mg/dL (ref 0–40)

## 2015-05-09 MED ORDER — SIMVASTATIN 40 MG PO TABS: 40.0000 mg | ORAL_TABLET | Freq: Every day | ORAL | Status: DC

## 2015-05-09 NOTE — Discharge Instructions (Signed)
Transient Ischemic Attack A transient ischemic attack (TIA) is a "warning stroke" that causes stroke-like symptoms. A TIA does not cause lasting damage to the brain. The symptoms of a TIA can happen fast and do not last long. It is important to know the symptoms of a TIA and what to do. This can help prevent stroke or death.  HOME CARE   Take medicines only as told by your doctor. Make sure you understand all of the instructions.  You may need to take aspirin or warfarin medicine. Warfarin needs to be taken exactly as told.  Taking too much or too little warfarin is dangerous. Blood tests must be done as often as told by your doctor. A PT blood test measures how long it takes for blood to clot. Your PT is used to calculate another value called an INR. Your PT and INR help your doctor adjust your warfarin dosage. He or she will make sure you are taking the right amount.  Food can cause problems with warfarin and affect the results of your blood tests. This is true for foods high in vitamin K. Eat the same amount of foods high in vitamin K each day. Foods high in vitamin K include spinach, kale, broccoli, cabbage, collard and turnip greens, Brussels sprouts, peas, cauliflower, seaweed, and parsley. Other foods high in vitamin K include beef and pork liver, green tea, and soybean oil. Eat the same amount of foods high in vitamin K each day. Avoid big changes in your diet. Tell your doctor before changing your diet. Talk to a food specialist (dietitian) if you have questions.  Many medicines can cause problems with warfarin and affect your PT and INR. Tell your doctor about all medicines you take. This includes vitamins and dietary pills (supplements). Do not take or stop taking any prescribed or over-the-counter medicines unless your doctor tells you to.  Warfarin can cause more bruising or bleeding. Hold pressure over any cuts for longer than normal. Talk to your doctor about other side effects of  warfarin.  Avoid sports or activities that may cause injury or bleeding.  Be careful when you shave, floss, or use sharp objects.  Avoid or drink very little alcohol while taking warfarin. Tell your doctor if you change how much alcohol you drink.  Tell your dentist and other doctors that you take warfarin before any procedures.  Follow your diet program as told, if you are given one.  Keep a healthy weight.  Stay active. Try to get at least 30 minutes of activity on all or most days.  Do not use any tobacco products, including cigarettes, chewing tobacco, or electronic cigarettes. If you need help quitting, ask your doctor.  Limit alcohol intake to no more than 1 drink per day for nonpregnant women and 2 drinks per day for men. One drink equals 12 ounces of beer, 5 ounces of wine, or 1 ounces of hard liquor.  Do not abuse drugs.  Keep your home safe so you do not fall. You can do this by:  Putting grab bars in the bedroom and bathroom.  Raising toilet seats.  Putting a seat in the shower.  Keep all follow-up visits as told by your doctor. This is important. GET HELP IF:  Your personality changes.  You have trouble swallowing.  You have double vision.  You are dizzy.  You have a fever. GET HELP RIGHT AWAY IF:  These symptoms may be an emergency. Do not wait to see if the  symptoms will go away. Get medical help right away. Call your local emergency services (911 in the [place] Do not drive yourself to the hospital.  You have sudden weakness or lose feeling (go numb), especially on one side of the body. This can affect your:  Face.  Arm.  Leg.  You have sudden trouble walking.  You have sudden trouble moving your arms or legs.  You have sudden confusion.  You have trouble talking.  You have trouble understanding.  You have sudden trouble seeing in one or both eyes.  You lose your balance.  Your movements are not smooth.  You have a sudden, very bad  headache with no known cause.  You have new chest pain.  Your heartbeat is unsteady.  You are partly or totally unaware of what is going on around you. MAKE SURE YOU:   Understand these instructions.  Will watch your condition.  Will get help right away if you are not doing well or get worse.   This information is not intended to replace advice given to you by your health care provider. Make sure you discuss any questions you have with your health care provider.   Document Released: 02/27/2008 Document Revised: 06/10/2014 Document Reviewed: 08/25/2013 Elsevier Interactive Patient Education [employer].

## 2015-05-09 NOTE — Progress Notes (Signed)
VASCULAR LAB PRELIMINARY  PRELIMINARY  PRELIMINARY  PRELIMINARY  Carotid duplex  completed.    Preliminary report:  Bilateral:  1-39% ICA stenosis.  Vertebral artery flow is antegrade.      Janeliz Prestwood, RVT 05/09/2015, 9:11 AM

## 2015-05-09 NOTE — Progress Notes (Signed)
Patient given discharge instructions, and verbalized an understanding of all discharge instructions.  Patient agrees with discharge plan, and is being discharged in stable medical condition.  Patient given assistance to ride.

## 2015-05-09 NOTE — Discharge Summary (Signed)
Physician Discharge Summary  [name] DDU:[id number] DOB: 08/16/1945 DOA: 05/08/2015  PCP: Delia Chimes, NP  Admit date: 05/08/2015 Discharge date: 05/09/2015  Time spent: 25 minutes  Recommendations for Outpatient Follow-up:  Discharge home with outpatient PCP and neurology follow-up  Discharge Diagnoses:  Principal problem Transient ischemic attack  Active Problems:   Multiple sclerosis ([place])   URINARY INCONTINENCE   Severe episode of recurrent major depressive disorder (HCC)   Numbness and tingling of right arm   Hyperlipidemia   Coronary artery disease   Discharge Condition: Fair  Diet recommendation: Heart healthy  Filed Weights   05/08/15 2029  Weight: 56.518 kg (124 lb 9.6 oz)    History of present illness:   69 year old female with history of DM, anxiety, HLD, MI, and MS who came here with cc of numbness in her left upper side neck/jaw/upper limb.  - The patient is not a good historian. She started first talking about her hazing dizziness/lightheadedness 3 months ago that was later resolved.  - On the day prior to admission she developed swelling her lip and tongue after taking Pristiq which was not associated with itching but resolved with Benadryl within 1-2 hours.  - On the day of admission she had similar symptoms today after taking Provigil. She mentioned that Provigil makes her blood pressure go high.  - Then she described having numbness in her left side in her arm up to her neck and jaw that resolved within a few hours.  - She said she also had difficulty swallowing but she has had that chronically due to MS and she has been able to tolerate her food. - She mentioned she possibly had slurred speech today as well that has resolved.  -She denies any focal weakness, headache, dizziness, nausea, vomiting, chest pain, palpitations, shortness of breath, abdominal pain, bowel or urinary symptoms.     Hospital Course:  ? TIA-like symptoms Admitted  on observation. It is unclear patient had true TIA versus reaction to medications (lip swelling and numbness) Head CT and MRI failed unremarkable for acute abnormality. Carotid Doppler done showed nonsignificant stenosis bilaterally. Her symptoms had completely resolved by the time she arrived to the ED. -Further workup showed LDL of 127. Patient's aspirin was continued. Her Pristiq and provogil were held with concern for drug allergy and I have discontinued them upon discharge until she is followed by her PCP. -2-D echo shows EF of 50-55% with no wall motion abnormality. -Patient wanted to go home during my evaluation this morning and did not want to be evaluated by speech therapy and PT. I also discussed about consulting neurology but she did not wish to stay for that either and promised to follow-up with her neurologist as outpatient. -Given her elevated LDL and underlying CAD I have switched her lovastatin to simvastatin 40 mg daily. -She should follow-up with her PCP and urology as outpatient.   Coronary artery disease Continue aspirin. Switched lovastatin to simvastatin. Continue metoprolol. Follows with Dr. Einar Gip.   Multiple sclerosis Continue gabapentin and Copaxone. Does not appear to be flareup of her symptoms. Follows with Dr. Tomi Likens as outpatient.  Severe depression Hold pristiq. Continue other medications.  Chronic dysphagia Outpatient follow-up.  Hyponatremia Mild. Possibly associated with some dehydration. Given IV fluids.  Patient is stable to be discharged home  Procedures:  Head CT  MRI brain  2-D echo  Carotid Doppler  Consultations:  None  Discharge Exam: Filed Vitals:   05/08/15 2029 05/09/15 0430  BP:  143/79 119/69  Pulse: 73 88  Temp: 97.7 F (36.5 C) 97.9 F (36.6 C)  Resp: 16 18    General: Elderly thin built female not in distress HEENT: No pallor, moist oral mucosa, supple neck, Cardiovascular: Normal S1 and S2, no murmurs rub or  gallop Respiratory: Clear bilaterally GI: Soft, nondistended, nontender, bowel sounds present Musculoskeletal: warm, no edema  CNS: Alert and oriented, nonfocal   Discharge Instructions    Current Discharge Medication List    START taking these medications   Details  simvastatin (ZOCOR) 40 MG tablet Take 1 tablet (40 mg total) by mouth daily. Qty: 30 tablet, Refills: 0      CONTINUE these medications which have NOT CHANGED   Details  albuterol (PROAIR HFA) 108 (90 BASE) MCG/ACT inhaler Inhale 2 puffs into the lungs every 6 (six) hours as needed for wheezing or shortness of breath.     ARIPiprazole (ABILIFY) 2 MG tablet Take 1 mg by mouth at bedtime.     Ascorbic Acid (VITAMIN C) 1000 MG tablet Take 1,000 mg by mouth daily.    aspirin 325 MG EC tablet Take 325 mg by mouth daily.    Cholecalciferol (VITAMIN D3) 3000 UNITS TABS Take 3,000 Units by mouth daily.    diphenhydrAMINE (BENADRYL) 25 MG tablet Take 25 mg by mouth at bedtime.    doxepin (SINEQUAN) 25 MG capsule Take 1 capsule (25 mg total) by mouth at bedtime. Qty: 30 capsule, Refills: 5    gabapentin (NEURONTIN) 300 MG capsule Take 1 capsule (300 mg total) by mouth daily. Qty: 30 capsule, Refills: 12    glatiramer (COPAXONE) 20 MG/ML SOSY injection Inject 20 mg into the skin every evening.     LINZESS 145 MCG CAPS capsule Take 145 mcg by mouth daily.     metoprolol succinate (TOPROL-XL) 25 MG 24 hr tablet Take 25 mg by mouth at bedtime.     Multiple Vitamin (MULTIVITAMIN WITH MINERALS) TABS tablet Take 1 tablet by mouth daily.    NUCYNTA ER 50 MG TB12 Take 50 mg by mouth 2 (two) times daily.     oxyCODONE-acetaminophen (PERCOCET) 5-325 MG per tablet Take 1 tablet by mouth 4 (four) times daily.     tetrahydrozoline 0.05 % ophthalmic solution Place 1 drop into both eyes 4 (four) times daily as needed (For dry eyes.).      STOP taking these medications     clonazePAM (KLONOPIN) 1 MG tablet      lovastatin  (MEVACOR) 40 MG tablet      modafinil (PROVIGIL) 200 MG tablet      PRISTIQ 50 MG 24 hr tablet      amphetamine-dextroamphetamine (ADDERALL XR) 30 MG 24 hr capsule        Allergies  Allergen Reactions  . Crestor [Rosuvastatin] Hives and Other (See Comments)    welps  . Amoxicillin Hives  . Risedronate Sodium Other (See Comments)    REACTION: Hurts stomach  . Sulfonamide Derivatives Hives   Follow-up Information    Follow up with [facility], NP. Schedule an appointment as soon as possible for a visit in 1 week.   Specialty:  Nurse Practitioner   Contact information:   [address] [place] [address] [phone]        The results of significant diagnostics from this hospitalization (including imaging, microbiology, ancillary and laboratory) are listed below for reference.    Significant Diagnostic Studies: Ct Head Wo Contrast  05/08/2015  CLINICAL DATA:  Left  facial droop and facial numbness. Symptoms yesterday. MRI 08/17/2014 EXAM: CT HEAD WITHOUT CONTRAST TECHNIQUE: Contiguous axial images were obtained from the base of the skull through the vertex without intravenous contrast. COMPARISON:  None. FINDINGS: No acute intracranial abnormality. Specifically, no hemorrhage, hydrocephalus, mass lesion, acute infarction, or significant intracranial injury. No acute calvarial abnormality. Visualized paranasal sinuses and mastoids clear. Orbital soft tissues unremarkable. IMPRESSION: Negative Electronically Signed   By: Rolm Baptise M.D.   On: 05/08/2015 15:07   Mr Brain Wo Contrast (neuro Protocol)  05/08/2015  CLINICAL DATA:  Acute onset of left hand and arm weakness with left facial droop. EXAM: MRI HEAD WITHOUT CONTRAST TECHNIQUE: Multiplanar, multiecho pulse sequences of the brain and surrounding structures were obtained without intravenous contrast. COMPARISON:  Head CT same day.  MRI 08/17/2014. FINDINGS: Diffusion imaging does not show any acute or subacute  infarction. There are mild chronic appearing small vessel ischemic changes affecting the brainstem, cerebellum and cerebral hemispheric white matter. No cortical or large vessel territory infarction. No mass lesion, hemorrhage, hydrocephalus or extra-axial collection. No pituitary mass. No inflammatory sinus disease. No skull or skullbase lesion. IMPRESSION: No acute finding. Chronic small-vessel ischemic changes affecting the pons, cerebellum and cerebral hemispheric white matter. Electronically Signed   By: Nelson Chimes M.D.   On: 05/08/2015 18:14    Microbiology: No results found for this or any previous visit (from the past 240 hour(s)).   Labs: Basic Metabolic Panel:  Recent Labs Lab 05/08/15 1441  NA 130*  K 4.1  CL 95*  CO2 28  GLUCOSE 92  BUN 16  CREATININE 0.62  CALCIUM 8.9   Liver Function Tests: No results for input(s): AST, ALT, ALKPHOS, BILITOT, PROT, ALBUMIN in the last 168 hours. No results for input(s): LIPASE, AMYLASE in the last 168 hours. No results for input(s): AMMONIA in the last 168 hours. CBC:  Recent Labs Lab 05/08/15 1441  WBC 6.8  NEUTROABS 4.1  HGB 12.1  HCT 35.8*  MCV 88.8  PLT 265   Cardiac Enzymes: No results for input(s): CKTOTAL, CKMB, CKMBINDEX, TROPONINI in the last 168 hours. BNP: BNP (last 3 results) No results for input(s): BNP in the last 8760 hours.  ProBNP (last 3 results) No results for input(s): PROBNP in the last 8760 hours.  CBG: No results for input(s): GLUCAP in the last 168 hours.     SignedLouellen Molder  [facility] 05/09/2015, 10:53 AM

## 2015-05-09 NOTE — Evaluation (Signed)
Physical Therapy Evaluation Patient Details Name: [name] MRN: [id number] DOB: August 19, 1945 Today's Date: 05/09/2015   History of Present Illness  69 y.o. female with h/o MS, back pain, MI, anxiety admitted with L sided neck and UE numbness. MRI and CT of head negative for acute changes. Dx: likely TIA  Clinical Impression  Pt reports the symptoms she had upon admission have resolved. She is at baseline of independence with mobility. She walked 400' without an assistive device with no loss of balance, HR 107 walking. No further PT indicated. She is safe to DC home from PT standpoint. Pt is signing off.     Follow Up Recommendations No PT follow up    Equipment Recommendations  None recommended by PT    Recommendations for Other Services       Precautions / Restrictions Precautions Precautions: Fall Precaution Comments: pt reports h/o 1 fall in past year Restrictions Weight Bearing Restrictions: No      Mobility  Bed Mobility Overal bed mobility: Independent                Transfers Overall transfer level: Independent                  Ambulation/Gait Ambulation/Gait assistance: Independent Ambulation Distance (Feet): 400 Feet Assistive device: None Gait Pattern/deviations: WFL(Within Functional Limits)   Gait velocity interpretation: at or above normal speed for age/gender General Gait Details: no LOB with head turns  Stairs            Wheelchair Mobility    Modified Rankin (Stroke Patients Only)       Balance Overall balance assessment: Independent                                           Pertinent Vitals/Pain Pain Assessment: No/denies pain    Home Living Family/patient expects to be discharged to:: Private residence Living Arrangements: Spouse/significant other Available Help at Discharge: Family;Available PRN/intermittently   Home Access: Stairs to enter   Entrance Stairs-Number of Steps: 4 Home  Layout: One level Home Equipment: Walker - 2 wheels;Cane - single point      Prior Function Level of Independence: Independent         Comments: pt stated she walks independently and performs ADLs independently, she reports she uses a cane at times when her back "is acting up"     Hand Dominance        Extremity/Trunk Assessment   Upper Extremity Assessment: Overall WFL for tasks assessed           Lower Extremity Assessment: Overall WFL for tasks assessed      Cervical / Trunk Assessment: Normal  Communication   Communication: No difficulties  Cognition Arousal/Alertness: Awake/alert Behavior During Therapy: WFL for tasks assessed/performed Overall Cognitive Status: Within Functional Limits for tasks assessed                      General Comments      Exercises        Assessment/Plan    PT Assessment Patent does not need any further PT services  PT Diagnosis     PT Problem List    PT Treatment Interventions     PT Goals (Current goals can be found in the Care Plan section) Acute Rehab PT Goals PT Goal Formulation: All assessment and education complete, DC  therapy    Frequency     Barriers to discharge        Co-evaluation               End of Session Equipment Utilized During Treatment: Gait belt Activity Tolerance: Patient tolerated treatment well;No increased pain Patient left: in bed;with call bell/phone within reach      Functional Assessment Tool Used: clinical judgement Functional Limitation: Mobility: Walking and moving around Mobility: Walking and Moving Around Current Status [phone]): 0 percent impaired, limited or restricted Mobility: Walking and Moving Around Goal Status [phone]): 0 percent impaired, limited or restricted Mobility: Walking and Moving Around Discharge Status [phone]): 0 percent impaired, limited or restricted    Time: 0768-0881 PT Time Calculation (min) (ACUTE ONLY): 18 min   Charges:   PT  Evaluation $Initial PT Evaluation Tier I: 1 Procedure     PT G Codes:   PT G-Codes **NOT FOR INPATIENT CLASS** Functional Assessment Tool Used: clinical judgement Functional Limitation: Mobility: Walking and moving around Mobility: Walking and Moving Around Current Status (J0315): 0 percent impaired, limited or restricted Mobility: Walking and Moving Around Goal Status (X4585): 0 percent impaired, limited or restricted Mobility: Walking and Moving Around Discharge Status [phone]): 0 percent impaired, limited or restricted    Philomena Doheny 05/09/2015, 9:30 AM [phone]

## 2015-05-09 NOTE — Progress Notes (Signed)
Echocardiogram 2D Echocardiogram has been performed.  [name] 05/09/2015, 9:41 AM

## 2015-05-10 ENCOUNTER — Ambulatory Visit (HOSPITAL_COMMUNITY): Payer: Self-pay | Admitting: Psychiatry

## 2015-05-10 LAB — HEMOGLOBIN A1C
Hgb A1c MFr Bld: 5.9 % — ABNORMAL HIGH (ref 4.8–5.6)
Mean Plasma Glucose: 123 mg/dL

## 2015-06-16 ENCOUNTER — Other Ambulatory Visit: Payer: Self-pay | Admitting: Nurse Practitioner

## 2015-06-16 ENCOUNTER — Ambulatory Visit
Admission: RE | Admit: 2015-06-16 | Discharge: 2015-06-16 | Disposition: A | Discharge status: Home / Self Care | Payer: Medicare Other | Source: Ambulatory Visit | Attending: Nurse Practitioner | Admitting: Nurse Practitioner

## 2015-06-16 DIAGNOSIS — R52 Pain, unspecified: Secondary | ICD-10-CM

## 2015-06-16 DIAGNOSIS — T1490XA Injury, unspecified, initial encounter: Secondary | ICD-10-CM

## 2015-07-11 ENCOUNTER — Ambulatory Visit: Payer: Self-pay | Admitting: Internal Medicine

## 2015-07-25 ENCOUNTER — Encounter (HOSPITAL_COMMUNITY): Payer: Self-pay | Admitting: Emergency Medicine

## 2015-07-25 ENCOUNTER — Emergency Department (HOSPITAL_COMMUNITY)
Admission: EM | Admit: 2015-07-25 | Discharge: 2015-07-25 | Disposition: A | Discharge status: Home / Self Care | Payer: Medicare Other | Attending: Emergency Medicine | Admitting: Emergency Medicine

## 2015-07-25 DIAGNOSIS — Z8619 Personal history of other infectious and parasitic diseases: Secondary | ICD-10-CM | POA: Diagnosis not present

## 2015-07-25 DIAGNOSIS — Z87891 Personal history of nicotine dependence: Secondary | ICD-10-CM | POA: Insufficient documentation

## 2015-07-25 DIAGNOSIS — Z8719 Personal history of other diseases of the digestive system: Secondary | ICD-10-CM | POA: Insufficient documentation

## 2015-07-25 DIAGNOSIS — N390 Urinary tract infection, site not specified: Secondary | ICD-10-CM | POA: Insufficient documentation

## 2015-07-25 DIAGNOSIS — F329 Major depressive disorder, single episode, unspecified: Secondary | ICD-10-CM | POA: Diagnosis not present

## 2015-07-25 DIAGNOSIS — Z880 Allergy status to penicillin: Secondary | ICD-10-CM | POA: Diagnosis not present

## 2015-07-25 DIAGNOSIS — Z7982 Long term (current) use of aspirin: Secondary | ICD-10-CM | POA: Insufficient documentation

## 2015-07-25 DIAGNOSIS — F419 Anxiety disorder, unspecified: Secondary | ICD-10-CM | POA: Diagnosis not present

## 2015-07-25 DIAGNOSIS — Z8669 Personal history of other diseases of the nervous system and sense organs: Secondary | ICD-10-CM | POA: Diagnosis not present

## 2015-07-25 DIAGNOSIS — M1990 Unspecified osteoarthritis, unspecified site: Secondary | ICD-10-CM | POA: Insufficient documentation

## 2015-07-25 DIAGNOSIS — Z86010 Personal history of colonic polyps: Secondary | ICD-10-CM | POA: Insufficient documentation

## 2015-07-25 DIAGNOSIS — M810 Age-related osteoporosis without current pathological fracture: Secondary | ICD-10-CM | POA: Diagnosis not present

## 2015-07-25 DIAGNOSIS — Z79899 Other long term (current) drug therapy: Secondary | ICD-10-CM | POA: Diagnosis not present

## 2015-07-25 DIAGNOSIS — E785 Hyperlipidemia, unspecified: Secondary | ICD-10-CM | POA: Diagnosis not present

## 2015-07-25 DIAGNOSIS — R1031 Right lower quadrant pain: Secondary | ICD-10-CM | POA: Diagnosis present

## 2015-07-25 LAB — CBC
HCT: 30.7 % — ABNORMAL LOW (ref 36.0–46.0)
Hemoglobin: 10.8 g/dL — ABNORMAL LOW (ref 12.0–15.0)
MCH: 30 pg (ref 26.0–34.0)
MCHC: 35.2 g/dL (ref 30.0–36.0)
MCV: 85.3 fL (ref 78.0–100.0)
PLATELETS: 261 10*3/uL (ref 150–400)
RBC: 3.6 MIL/uL — AB (ref 3.87–5.11)
RDW: 13.7 % (ref 11.5–15.5)
WBC: 12.8 10*3/uL — AB (ref 4.0–10.5)

## 2015-07-25 LAB — BASIC METABOLIC PANEL
ANION GAP: 10 (ref 5–15)
BUN: 21 mg/dL — ABNORMAL HIGH (ref 6–20)
CALCIUM: 8.3 mg/dL — AB (ref 8.9–10.3)
CO2: 25 mmol/L (ref 22–32)
Chloride: 92 mmol/L — ABNORMAL LOW (ref 101–111)
Creatinine, Ser: 1.19 mg/dL — ABNORMAL HIGH (ref 0.44–1.00)
GFR, EST AFRICAN AMERICAN: 52 mL/min — AB (ref >60)
GFR, EST NON AFRICAN AMERICAN: 45 mL/min — AB (ref >60)
Glucose, Bld: 92 mg/dL (ref 65–99)
POTASSIUM: 3.1 mmol/L — AB (ref 3.5–5.1)
SODIUM: 127 mmol/L — AB (ref 135–145)

## 2015-07-25 LAB — URINE MICROSCOPIC-ADD ON

## 2015-07-25 LAB — URINALYSIS, ROUTINE W REFLEX MICROSCOPIC
Bilirubin Urine: NEGATIVE
GLUCOSE, UA: NEGATIVE mg/dL
KETONES UR: NEGATIVE mg/dL
NITRITE: NEGATIVE
PROTEIN: NEGATIVE mg/dL
Specific Gravity, Urine: 1.007 (ref 1.005–1.030)
pH: 5.5 (ref 5.0–8.0)

## 2015-07-25 MED ORDER — SODIUM CHLORIDE 0.9 % IV BOLUS (SEPSIS)
1000.0000 mL | Freq: Once | INTRAVENOUS | Status: AC
  Administered 2015-07-25: 1000 mL via INTRAVENOUS

## 2015-07-25 MED ORDER — POTASSIUM CHLORIDE CRYS ER 20 MEQ PO TBCR
20.0000 meq | EXTENDED_RELEASE_TABLET | Freq: Once | ORAL | Status: AC
  Administered 2015-07-25: 20 meq via ORAL
  Filled 2015-07-25: qty 1

## 2015-07-25 MED ORDER — CIPROFLOXACIN HCL 250 MG PO TABS: 250.0000 mg | ORAL_TABLET | Freq: Two times a day (BID) | ORAL | Status: DC
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
## 2015-07-25 NOTE — ED Notes (Addendum)
Per [facility] pt from pcp. Lives home . Per PCP staff pt co weakness and lethargy x 2weeks. Possible UTI , UA done at PCP with positive hematuria and leucocytes. Pt is alert and oriented x 4. Per ems no Hx dementia. Per ems pt has taken 2 hydrocodone prior to arrival.

## 2015-07-25 NOTE — ED Notes (Signed)
Talked to pt's husband to provide pt ride back home, sts will be here in 30 min.

## 2015-07-25 NOTE — Discharge Instructions (Signed)
Please read and follow all provided instructions.  Your diagnoses today include:  1. UTI (lower urinary tract infection)    Tests performed today include:  Urine test - suggests that you have an infection in your bladder  Medications prescribed:   Ciprofloxacin. Take as Prescribed.   Home care instructions:  Follow any educational materials contained in this packet.  Follow-up instructions: Please follow-up with your primary care provider in 5-7 days if symptoms are not resolved for further evaluation of your symptoms.  Return instructions:   Please return to the Emergency Department if you experience worsening symptoms.   Return with fever, worsening pain, persistent vomiting, worsening pain in your back.   Please return if you have any other emergent concerns.

## 2015-07-25 NOTE — ED Provider Notes (Signed)
Medical screening examination/treatment/procedure(s) were conducted as a shared visit with non-physician practitioner(s) and myself.  I personally evaluated the patient during the encounter.  Pt presented to the ED from PCPs office.  No notes are available from the doctors office but patient mentions that she was confused.  Pt is not confused here.  She is alert and oriented.  No distress.  UA shows a uti.  Mild decrease in sodium compared to previous values.  Mild dehydration.  Will give iv fluid bolus. Plan on dc home with oral abx.  Dorie Rank, MD 07/25/15 [phone]

## 2015-07-27 LAB — URINE CULTURE

## 2015-07-28 ENCOUNTER — Telehealth (HOSPITAL_COMMUNITY): Payer: Self-pay

## 2015-07-28 NOTE — Telephone Encounter (Signed)
Post ED Visit - Positive Culture Follow-up  Culture report reviewed by antimicrobial stewardship pharmacist:  '[x]'$  Elenor Quinones, Pharm.D. '[]'$  Heide Guile, Pharm.D., BCPS '[]'$  Parks Neptune, Pharm.D. '[]'$  Alycia Rossetti, Pharm.D., BCPS '[]'$  [place], Pharm.D., BCPS, AAHIVP '[]'$  Legrand Como, Pharm.D., BCPS, AAHIVP '[]'$  Milus Glazier, Pharm.D. '[]'$  Stephens November, [place].D.  Positive urine culture, 70,000 colonies -> E Coli Treated with Ciprofloxacin, organism sensitive to the same and no further patient follow-up is required at this time.  Dortha Kern 07/28/2015, 12:04 PM

## 2015-07-31 ENCOUNTER — Ambulatory Visit: Payer: Self-pay | Admitting: Internal Medicine

## 2015-08-30 ENCOUNTER — Encounter: Payer: Self-pay | Admitting: Neurology

## 2015-08-30 ENCOUNTER — Other Ambulatory Visit (INDEPENDENT_AMBULATORY_CARE_PROVIDER_SITE_OTHER): Payer: Medicare Other

## 2015-08-30 ENCOUNTER — Ambulatory Visit (INDEPENDENT_AMBULATORY_CARE_PROVIDER_SITE_OTHER): Payer: Medicare Other | Admitting: Neurology

## 2015-08-30 VITALS — BP 122/54 | HR 87 | Ht 61.0 in | Wt 120.0 lb

## 2015-08-30 DIAGNOSIS — F32A Depression, unspecified: Secondary | ICD-10-CM

## 2015-08-30 DIAGNOSIS — R5383 Other fatigue: Secondary | ICD-10-CM | POA: Diagnosis not present

## 2015-08-30 DIAGNOSIS — G35 Multiple sclerosis: Secondary | ICD-10-CM

## 2015-08-30 DIAGNOSIS — G35D Multiple sclerosis, unspecified: Secondary | ICD-10-CM

## 2015-08-30 DIAGNOSIS — F329 Major depressive disorder, single episode, unspecified: Secondary | ICD-10-CM | POA: Diagnosis not present

## 2015-08-30 DIAGNOSIS — G459 Transient cerebral ischemic attack, unspecified: Secondary | ICD-10-CM | POA: Diagnosis not present

## 2015-08-30 LAB — VITAMIN D 25 HYDROXY (VIT D DEFICIENCY, FRACTURES): VITD: 25.09 ng/mL — ABNORMAL LOW (ref 30.00–100.00)

## 2015-08-30 NOTE — Patient Instructions (Signed)
1.  Continue Copaxone 2.  Continue gabapentin '100mg'$  at bedtime 3.  Continue Provigil 4.  Continue vitamin D 2000 units daily.  Will check another level today 5.  Discuss with Dr. Toy Cookey about changing antidepressant 6.  Continue aspirin and simvastatin 7.  Follow up in 9 months

## 2015-08-30 NOTE — Progress Notes (Signed)
Chart forwarded.  
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
## 2015-08-31 ENCOUNTER — Telehealth: Payer: Self-pay

## 2015-08-31 NOTE — Telephone Encounter (Signed)
Mychart message sent.

## 2015-08-31 NOTE — Telephone Encounter (Signed)
-----   Message from Pieter Partridge, DO sent at 08/30/2015  7:46 PM EDT ----- D level is low.  I recommend increasing D3 to 4000 IU daily

## 2015-10-23 ENCOUNTER — Ambulatory Visit (HOSPITAL_COMMUNITY)
Admission: RE | Admit: 2015-10-23 | Discharge: 2015-10-23 | Disposition: A | Discharge status: Home / Self Care | Payer: Medicare Other | Source: Ambulatory Visit | Attending: Gastroenterology | Admitting: Gastroenterology

## 2015-10-23 ENCOUNTER — Encounter (HOSPITAL_COMMUNITY)
Admission: RE | Disposition: A | Discharge status: Home / Self Care | Payer: Self-pay | Source: Ambulatory Visit | Attending: Gastroenterology

## 2015-10-23 DIAGNOSIS — R1310 Dysphagia, unspecified: Secondary | ICD-10-CM | POA: Diagnosis not present

## 2015-10-23 HISTORY — PX: ESOPHAGEAL MANOMETRY: SHX5429

## 2015-10-23 SURGERY — MANOMETRY, ESOPHAGUS

## 2015-10-23 MED ORDER — LIDOCAINE VISCOUS 2 % MT SOLN
OROMUCOSAL | Status: AC
  Filled 2015-10-23: qty 15

## 2015-10-23 SURGICAL SUPPLY — 2 items
FACESHIELD LNG OPTICON STERILE (SAFETY) IMPLANT
GLOVE BIO SURGEON STRL SZ8 (GLOVE) ×6 IMPLANT

## 2015-10-23 NOTE — Progress Notes (Signed)
Esophageal Manometry done per protocol. Pt tolerated well with no complications. Report to be given to Dr. Michail Sermon.

## 2015-10-25 ENCOUNTER — Encounter (HOSPITAL_COMMUNITY): Payer: Self-pay | Admitting: Gastroenterology

## 2015-11-16 NOTE — Progress Notes (Signed)
PATIENT MAY TAKE NUCYNTA AM OF EGD PER DR EWELL ANESTHESIA.

## 2015-11-17 ENCOUNTER — Encounter (HOSPITAL_COMMUNITY): Payer: Self-pay | Admitting: *Deleted

## 2015-11-23 ENCOUNTER — Other Ambulatory Visit: Payer: Self-pay | Admitting: Gastroenterology

## 2015-11-27 ENCOUNTER — Ambulatory Visit (HOSPITAL_COMMUNITY): Admission: RE | Admit: 2015-11-27 | Payer: Medicare Other | Source: Ambulatory Visit | Admitting: Gastroenterology

## 2015-11-27 HISTORY — DX: Adverse effect of unspecified anesthetic, initial encounter: T41.45XA

## 2015-11-27 HISTORY — DX: Fibromyalgia: M79.7

## 2015-11-27 HISTORY — DX: Other complications of anesthesia, initial encounter: T88.59XA

## 2015-11-27 HISTORY — DX: Atherosclerotic heart disease of native coronary artery without angina pectoris: I25.10

## 2015-11-27 HISTORY — DX: Cardiac arrhythmia, unspecified: I49.9

## 2015-11-27 HISTORY — DX: Cervical disc disorder, unspecified, unspecified cervical region: M50.90

## 2015-11-27 SURGERY — EGD (ESOPHAGOGASTRODUODENOSCOPY)
Anesthesia: Monitor Anesthesia Care

## 2016-01-22 ENCOUNTER — Other Ambulatory Visit: Payer: Self-pay | Admitting: Neurology

## 2016-03-14 ENCOUNTER — Encounter (HOSPITAL_COMMUNITY): Payer: Self-pay | Admitting: *Deleted

## 2016-03-15 ENCOUNTER — Other Ambulatory Visit: Payer: Self-pay | Admitting: Gastroenterology

## 2016-03-19 ENCOUNTER — Other Ambulatory Visit: Payer: Self-pay | Admitting: Neurology

## 2016-03-21 ENCOUNTER — Encounter (HOSPITAL_COMMUNITY)
Admission: RE | Disposition: A | Discharge status: Home / Self Care | Payer: Self-pay | Source: Ambulatory Visit | Attending: Gastroenterology

## 2016-03-21 ENCOUNTER — Encounter (HOSPITAL_COMMUNITY): Payer: Self-pay | Admitting: Certified Registered Nurse Anesthetist

## 2016-03-21 ENCOUNTER — Ambulatory Visit (HOSPITAL_COMMUNITY): Payer: Medicare Other | Admitting: Certified Registered Nurse Anesthetist

## 2016-03-21 ENCOUNTER — Ambulatory Visit (HOSPITAL_COMMUNITY)
Admission: RE | Admit: 2016-03-21 | Discharge: 2016-03-21 | Disposition: A | Discharge status: Home / Self Care | Payer: Medicare Other | Source: Ambulatory Visit | Attending: Gastroenterology | Admitting: Gastroenterology

## 2016-03-21 DIAGNOSIS — Z79899 Other long term (current) drug therapy: Secondary | ICD-10-CM | POA: Diagnosis not present

## 2016-03-21 DIAGNOSIS — Z87891 Personal history of nicotine dependence: Secondary | ICD-10-CM | POA: Diagnosis not present

## 2016-03-21 DIAGNOSIS — F419 Anxiety disorder, unspecified: Secondary | ICD-10-CM | POA: Diagnosis not present

## 2016-03-21 DIAGNOSIS — M797 Fibromyalgia: Secondary | ICD-10-CM | POA: Insufficient documentation

## 2016-03-21 DIAGNOSIS — Z85828 Personal history of other malignant neoplasm of skin: Secondary | ICD-10-CM | POA: Insufficient documentation

## 2016-03-21 DIAGNOSIS — Z7982 Long term (current) use of aspirin: Secondary | ICD-10-CM | POA: Diagnosis not present

## 2016-03-21 DIAGNOSIS — G35 Multiple sclerosis: Secondary | ICD-10-CM | POA: Diagnosis not present

## 2016-03-21 DIAGNOSIS — Z8673 Personal history of transient ischemic attack (TIA), and cerebral infarction without residual deficits: Secondary | ICD-10-CM | POA: Diagnosis not present

## 2016-03-21 DIAGNOSIS — F329 Major depressive disorder, single episode, unspecified: Secondary | ICD-10-CM | POA: Insufficient documentation

## 2016-03-21 DIAGNOSIS — R1310 Dysphagia, unspecified: Secondary | ICD-10-CM

## 2016-03-21 DIAGNOSIS — I2510 Atherosclerotic heart disease of native coronary artery without angina pectoris: Secondary | ICD-10-CM | POA: Insufficient documentation

## 2016-03-21 DIAGNOSIS — K219 Gastro-esophageal reflux disease without esophagitis: Secondary | ICD-10-CM | POA: Diagnosis not present

## 2016-03-21 DIAGNOSIS — K224 Dyskinesia of esophagus: Secondary | ICD-10-CM | POA: Diagnosis not present

## 2016-03-21 DIAGNOSIS — Z79891 Long term (current) use of opiate analgesic: Secondary | ICD-10-CM | POA: Insufficient documentation

## 2016-03-21 HISTORY — PX: BOTOX INJECTION: SHX5754

## 2016-03-21 HISTORY — PX: ESOPHAGOGASTRODUODENOSCOPY (EGD) WITH PROPOFOL: SHX5813

## 2016-03-21 SURGERY — ESOPHAGOGASTRODUODENOSCOPY (EGD) WITH PROPOFOL
Anesthesia: Monitor Anesthesia Care

## 2016-03-21 MED ORDER — SODIUM CHLORIDE 0.9 % IV SOLN: INTRAVENOUS | Status: DC

## 2016-03-21 MED ORDER — ONABOTULINUMTOXINA 100 UNITS IJ SOLR
INTRAMUSCULAR | Status: AC
  Filled 2016-03-21: qty 100

## 2016-03-21 MED ORDER — SODIUM CHLORIDE 0.9 % IJ SOLN
INTRAMUSCULAR | Status: DC | PRN
  Administered 2016-03-21: 4 mL via SUBMUCOSAL

## 2016-03-21 MED ORDER — PROPOFOL 500 MG/50ML IV EMUL
INTRAVENOUS | Status: DC | PRN
  Administered 2016-03-21 (×2): 25 mg via INTRAVENOUS

## 2016-03-21 MED ORDER — PROPOFOL 500 MG/50ML IV EMUL
INTRAVENOUS | Status: DC | PRN
  Administered 2016-03-21: 100 ug/kg/min via INTRAVENOUS

## 2016-03-21 MED ORDER — SODIUM CHLORIDE 0.9 % IJ SOLN
100.0000 [IU] | Freq: Once | INTRAMUSCULAR | Status: DC
  Filled 2016-03-21: qty 100

## 2016-03-21 MED ORDER — LACTATED RINGERS IV SOLN
INTRAVENOUS | Status: DC
  Administered 2016-03-21: 1000 mL via INTRAVENOUS

## 2016-03-21 MED ORDER — SODIUM CHLORIDE 0.9 % IJ SOLN
INTRAMUSCULAR | Status: AC
  Filled 2016-03-21: qty 10

## 2016-03-21 MED ORDER — PROPOFOL 10 MG/ML IV BOLUS
INTRAVENOUS | Status: AC
  Filled 2016-03-21: qty 40

## 2016-03-21 SURGICAL SUPPLY — 15 items

## 2016-03-21 NOTE — Discharge Instructions (Signed)
Gastrointestinal Endoscopy, Care After °Refer to this sheet in the next few weeks. These instructions provide you with information on caring for yourself after your procedure. Your caregiver may also give you more specific instructions. Your treatment has been planned according to current medical practices, but problems sometimes occur. Call your caregiver if you have any problems or questions after your procedure. °HOME CARE INSTRUCTIONS °· If you were given medicine to help you relax (sedative), do not drive, operate machinery, or sign important documents for 24 hours. °· Avoid alcohol and hot or warm beverages for the first 24 hours after the procedure. °· Only take over-the-counter or prescription medicines for pain, discomfort, or fever as directed by your caregiver. You may resume taking your normal medicines unless your caregiver tells you otherwise. Ask your caregiver when you may resume taking medicines that may cause bleeding, such as aspirin, clopidogrel, or warfarin. °· You may return to your normal diet and activities on the day after your procedure, or as directed by your caregiver. Walking may help to reduce any bloated feeling in your abdomen. °· Drink enough fluids to keep your urine clear or pale yellow. °· You may gargle with salt water if you have a sore throat. °SEEK IMMEDIATE MEDICAL CARE IF: °· You have severe nausea or vomiting. °· You have severe abdominal pain, abdominal cramps that last longer than 6 hours, or abdominal swelling (distention). °· You have severe shoulder or back pain. °· You have trouble swallowing. °· You have shortness of breath, your breathing is shallow, or you are breathing faster than normal. °· You have a fever or a rapid heartbeat. °· You vomit blood or material that looks like coffee grounds. °· You have bloody, black, or tarry stools. °MAKE SURE YOU: °· Understand these instructions. °· Will watch your condition. °· Will get help right away if you are not doing  well or get worse. °  °This information is not intended to replace advice given to you by your health care provider. Make sure you discuss any questions you have with your health care provider. °  °Document Released: 01/02/2004 Document Revised: 06/10/2014 Document Reviewed: 08/20/2011 °Elsevier Interactive Patient Education ©2016 Elsevier Inc. ° °

## 2016-03-21 NOTE — Interval H&P Note (Signed)
History and Physical Interval Note:  03/21/2016 8:48 AM  [name]  has presented today for surgery, with the diagnosis of trouble swallowing   The various methods of treatment have been discussed with the patient and family. After consideration of risks, benefits and other options for treatment, the patient has consented to  Procedure(s): ESOPHAGOGASTRODUODENOSCOPY (EGD) WITH PROPOFOL (N/A) BOTOX INJECTION (N/A) as a surgical intervention .  The patient's history has been reviewed, patient examined, no change in status, stable for surgery.  I have reviewed the patient's chart and labs.  Questions were answered to the patient's satisfaction.     [place] C.

## 2016-03-21 NOTE — Anesthesia Preprocedure Evaluation (Signed)
Anesthesia Evaluation  Patient identified by MRN, date of birth, ID band Patient awake    Reviewed: Allergy & Precautions, NPO status , Patient's Chart, lab work & pertinent test results  Airway Mallampati: II  TM Distance: >3 FB Neck ROM: Full    Dental no notable dental hx.    Pulmonary neg pulmonary ROS, former smoker,    Pulmonary exam normal breath sounds clear to auscultation       Cardiovascular + CAD and + Cardiac Stents  Normal cardiovascular exam Rhythm:Regular Rate:Normal     Neuro/Psych MS  Neuromuscular disease negative psych ROS   GI/Hepatic negative GI ROS, Neg liver ROS,   Endo/Other  negative endocrine ROS  Renal/GU negative Renal ROS  negative genitourinary   Musculoskeletal negative musculoskeletal ROS (+)   Abdominal   Peds negative pediatric ROS (+)  Hematology negative hematology ROS (+)   Anesthesia Other Findings   Reproductive/Obstetrics negative OB ROS                             Anesthesia Physical Anesthesia Plan  ASA: III  Anesthesia Plan: MAC   Post-op Pain Management:    Induction: Intravenous  Airway Management Planned: Nasal Cannula  Additional Equipment:   Intra-op Plan:   Post-operative Plan:   Informed Consent: I have reviewed the patients History and Physical, chart, labs and discussed the procedure including the risks, benefits and alternatives for the proposed anesthesia with the patient or authorized representative who has indicated his/her understanding and acceptance.   Dental advisory given  Plan Discussed with: CRNA and Surgeon  Anesthesia Plan Comments:         Anesthesia Quick Evaluation

## 2016-03-21 NOTE — Anesthesia Postprocedure Evaluation (Signed)
Anesthesia Post Note  Patient: [name]  Procedure(s) Performed: Procedure(s) (LRB): ESOPHAGOGASTRODUODENOSCOPY (EGD) WITH PROPOFOL (N/A) BOTOX INJECTION (N/A)  Patient location during evaluation: PACU Anesthesia Type: MAC Level of consciousness: awake and alert Pain management: pain level controlled Vital Signs Assessment: post-procedure vital signs reviewed and stable Respiratory status: spontaneous breathing, nonlabored ventilation, respiratory function stable and patient connected to nasal cannula oxygen Cardiovascular status: stable and blood pressure returned to baseline Anesthetic complications: no    Last Vitals:  Vitals:   03/21/16 0925 03/21/16 0926  BP:  (!) 94/56  Pulse: 70   Resp: 20   Temp: 36.4 C     Last Pain:  Vitals:   03/21/16 0925  TempSrc: Oral                 Jarrah Babich S

## 2016-03-21 NOTE — Op Note (Signed)
[facility] Patient Name: [name] Procedure Date: 03/21/2016 MRN: [id number] Attending MD: Lear Ng , MD Date of Birth: September 12, 1945 CSN: [id number] Age: 70 Admit Type: Outpatient Procedure:                Upper GI endoscopy Indications:              Dysphagia, Esophageal reflux Providers:                Lear Ng, MD, Elmer Ramp. Tilden Dome, RN, Alfonso Patten, Technician, Herbie Drape, CRNA Referring MD:              Medicines:                Propofol per Anesthesia, Monitored Anesthesia Care Complications:            No immediate complications. Estimated Blood Loss:     Estimated blood loss was minimal. Procedure:                Pre-Anesthesia Assessment:                           - Prior to the procedure, a History and Physical                            was performed, and patient medications and                            allergies were reviewed. The patient's tolerance of                            previous anesthesia was also reviewed. The risks                            and benefits of the procedure and the sedation                            options and risks were discussed with the patient.                            All questions were answered, and informed consent                            was obtained. Prior Anticoagulants: The patient has                            taken no previous anticoagulant or antiplatelet                            agents. ASA Grade Assessment: III - A patient with                            severe systemic disease. After reviewing the risks  and benefits, the patient was deemed in                            satisfactory condition to undergo the procedure.                           After obtaining informed consent, the endoscope was                            passed under direct vision. Throughout the                            procedure, the patient's blood  pressure, pulse, and                            oxygen saturations were monitored continuously. The                            [id number]) scope was introduced through the                            mouth, and advanced to the second part of duodenum.                            The upper GI endoscopy was accomplished without                            difficulty. The patient tolerated the procedure                            well. Scope In: Scope Out: Findings:      The examined esophagus was normal.      The Z-line was regular and was found 36 cm from the incisors.      Abnormal motility was noted at the lower esophageal sphincter. The       distal esophagus/lower esophageal sphincter is spastic, but gives up       passage to the endoscope. Area was successfully injected with 4 mL       botulinum toxin for muscle relaxation. Estimated blood loss was minimal.      Segmental mild mucosal changes characterized by congestion and erythema       were found in the prepyloric region of the stomach.      The cardia and gastric fundus were normal on retroflexion.      The exam of the stomach was otherwise normal.      The examined duodenum was normal. Impression:               - Normal esophagus.                           - Z-line regular, 36 cm from the incisors.                           - Abnormal esophageal motility. Injected.                           -  Congested and erythematous mucosa in the                            prepyloric region of the stomach.                           - Normal examined duodenum.                           - No specimens collected. Moderate Sedation:      N/A- Per Anesthesia Care Recommendation:           - Return to GI office in 2 months.                           - Patient has a contact number available for                            emergencies. The signs and symptoms of potential                            delayed complications were discussed with the                             patient. Return to normal activities tomorrow.                            Written discharge instructions were provided to the                            patient.                           - Resume previous diet.                           - Continue present medications. Procedure Code(s):        --- Professional ---                           [phone], Esophagogastroduodenoscopy, flexible,                            transoral; with directed submucosal injection(s),                            any substance Diagnosis Code(s):        --- Professional ---                           R13.10, Dysphagia, unspecified                           K21.9, Gastro-esophageal reflux disease without                            esophagitis  K22.4, Dyskinesia of esophagus                           K31.89, Other diseases of stomach and duodenum CPT copyright 2016 American Medical Association. All rights reserved. The codes documented in this report are preliminary and upon coder review may  be revised to meet current compliance requirements. Lear Ng, MD 03/21/2016 9:40:57 AM This report has been signed electronically. Number of Addenda: 0

## 2016-03-21 NOTE — H&P (Signed)
  Date of Initial H&P: 03/14/16  History reviewed, patient examined, no change in status, stable for surgery.

## 2016-03-21 NOTE — Transfer of Care (Signed)
Immediate Anesthesia Transfer of Care Note  Patient: [name]  Procedure(s) Performed: Procedure(s): ESOPHAGOGASTRODUODENOSCOPY (EGD) WITH PROPOFOL (N/A) BOTOX INJECTION (N/A)  Patient Location: PACU  Anesthesia Type:MAC  Level of Consciousness: sedated, patient cooperative and responds to stimulation  Airway & Oxygen Therapy: Patient Spontanous Breathing and Patient connected to nasal cannula oxygen  Post-op Assessment: Report given to RN and Post -op Vital signs reviewed and stable  Post vital signs: Reviewed and stable  Last Vitals:  Vitals:   03/21/16 0925 03/21/16 0926  BP:  (!) 94/56  Pulse: 70   Resp: 20   Temp: 36.4 C     Last Pain:  Vitals:   03/21/16 0925  TempSrc: Oral         Complications: No apparent anesthesia complications

## 2016-03-25 ENCOUNTER — Encounter (HOSPITAL_COMMUNITY): Payer: Self-pay | Admitting: Gastroenterology

## 2016-03-25 ENCOUNTER — Other Ambulatory Visit: Payer: Self-pay | Admitting: Neurology

## 2016-04-18 ENCOUNTER — Other Ambulatory Visit: Payer: Self-pay | Admitting: Neurology

## 2016-05-13 ENCOUNTER — Other Ambulatory Visit: Payer: Self-pay | Admitting: Neurology

## 2016-05-31 ENCOUNTER — Ambulatory Visit (INDEPENDENT_AMBULATORY_CARE_PROVIDER_SITE_OTHER): Payer: Medicare Other | Admitting: Neurology

## 2016-05-31 ENCOUNTER — Encounter: Payer: Self-pay | Admitting: Neurology

## 2016-05-31 VITALS — BP 128/76 | HR 94 | Ht 61.0 in | Wt 120.0 lb

## 2016-05-31 DIAGNOSIS — F419 Anxiety disorder, unspecified: Secondary | ICD-10-CM

## 2016-05-31 DIAGNOSIS — R5383 Other fatigue: Secondary | ICD-10-CM | POA: Diagnosis not present

## 2016-05-31 DIAGNOSIS — F329 Major depressive disorder, single episode, unspecified: Secondary | ICD-10-CM

## 2016-05-31 DIAGNOSIS — G35 Multiple sclerosis: Secondary | ICD-10-CM

## 2016-05-31 MED ORDER — CLONAZEPAM 1 MG PO TABS: 1.0000 mg | ORAL_TABLET | Freq: Three times a day (TID) | ORAL | 1 refills | Status: DC | PRN

## 2016-05-31 MED ORDER — MODAFINIL 200 MG PO TABS: 200.0000 mg | ORAL_TABLET | Freq: Two times a day (BID) | ORAL | 0 refills | Status: DC

## 2016-05-31 NOTE — Progress Notes (Signed)
NEUROLOGY FOLLOW UP OFFICE NOTE  [name] [id number]  HISTORY OF PRESENT ILLNESS: [name] is a 70 year old right-handed woman with CAD, thyroid disease, IBS, scar tissue of left lung, arthritis, fibromyalgia, depression, MS and history of BPPV who follows up for MS.    UPDATE: Current medications: Copaxone '20mg'$  [place] daily Gabapentin '100mg'$  at bedtime ('300mg'$  causes drowsiness) Clonazepam '1mg'$  during day and '2mg'$  at bedtime for spasms Vitamin D level from March was 25.  She was advised to start D3 4000 IU daily  Neurologically, she is doing well.  She reports flu-like symptoms with productive cough.  She had a fever last week, but has since been afebrile.  She has not seen a PCP since she is in the middle of switching providers.   HISTORY: First symptom occurred in 1995, when she developed vision loss in the left eye, as well as left eye pain.  MRI of the brain with and without contrast was performed on 03/28/95 revealed 10-12 periventricular white matter lesions and one lesion in the corpus callosum, suspicious for demyelination.  There was also enhancement of the left optic nerve.  She was referred to Dr. Delphia Grates, an [place] specialist.  Lumbar puncture was performed, revealing no oligoclonal bands or myelin basic protein.  She has not required hospitalization or steroids since the late 1990s.  She was on Betaseron and later Copaxone.  Copaxone was discontinued due to longstanding stability, but due to fatigue and cognitive issues, it was restarted in 2014.  Her continued symptoms are episodes of worsening fatigue, as well as cognitive issues.  Sometimes, she has difficulty thinking clearly.  She will get disoriented while driving.  Other times, she has word-finding difficulties.  She once overpaid a bill.  She still performs all ADLs, including paying the bills.  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  Sometimes, when she feels symptoms are worse,  she sees a white "half a doughnut" shape in her visual field.     Current disease-modifying agent:  Copaxone '20mg'$  [place] daily Past disease-modifying agents:  Betaseron (elevated LFTs and necrotic injection sites)   For fatigue, she takes Provigil.  Adderal was ineffective.  Amantadine was ineffective. For pain in the arms and legs, she takes gabapentin '300mg'$  at bedtime.  She reportedly has lumbar radiculopathy which also contributes to the leg pain. Initially, she reportedly had significant spasticity.  She was on oral baclofen which did not help.  At one point, a baclofen pump was entertained.  She currently takes clonazepam '1mg'$  during the day and '2mg'$  at bedtime, which is effective. She sometimes requires ambulation with a cane or walker.  On occasion, she has fallen. She has permanent vision loss in the left eye.   Other imaging: 05/07/95 MRI LUMBAR WO:  mild disc bulging at L4-5 without disc herniation or central canal stenosis. 03/09/96 MRI BRAIN W/WO (comparing to MRI from 03/28/95):  slight increase in number of white matter lesions adjacent to the atrium of the right lateral ventricle, otherwise unchanged. 03/28/98 MRI BRAIN W/WO (comparing to MRI from 03/25/97):  several subtle areas of increased signal in the deep white matter, not as prevalent as on prior study.  No abnormal enhancement. 08/11/13 MRI of brain with and without contrast :  multiple supratentorial and infratentorial T2 hyperintensities, but no post-contrast enhancement.  However, this was not compared to prior imaging. 01/28/14 MRI Cervical spine without contrast:  spinal cord unremarkable.[name] MRI of brain with  and without contrast from 08/17/14 is stable MRI of brain without contrast from 05/08/15 is stable.  PAST MEDICAL HISTORY: Past Medical History:  Diagnosis Date  . Anxiety   . Cervical disc disease    BULGING  . Complication of anesthesia    SLOW TO AWAKEN AFTER LAST COLONSCOPY  . Coronary artery disease   .  Depression   . DJD (degenerative joint disease)    OA  . Dysrhythmia   . Fibromyalgia   . H/O: liver disease 73 YRS AGO   previous interferon induced  . Heart attack FEW YRS AGO  . Hx of colonic polyps   . Hyperlipidemia   . Multiple sclerosis ([place])   . Narcotic dependence ([facility])    hx of benzodiazepine and narcotic  . Osteoporosis   . Positive H. pylori test   . Urinary incontinence     MEDICATIONS: Current Outpatient Prescriptions on File Prior to Visit  Medication Sig Dispense Refill  . albuterol (PROAIR HFA) 108 (90 BASE) MCG/ACT inhaler Inhale 2 puffs into the lungs every 6 (six) hours as needed for wheezing or shortness of breath.     . Ascorbic Acid (VITAMIN C) 1000 MG tablet Take 1,000 mg by mouth daily.    [name] aspirin 325 MG tablet Take 325 mg by mouth daily.    . Cholecalciferol (VITAMIN D3) 3000 UNITS TABS Take 3,000 Units by mouth daily.    [name] COPAXONE 20 MG/ML SOSY injection USE 1 INJECTION SUBCUTANEOUSLY ONCE A DAY 30 mL 3  . diphenhydrAMINE (BENADRYL) 25 MG tablet Take 25 mg by mouth at bedtime as needed.     . doxepin (SINEQUAN) 25 MG capsule Take 1 capsule (25 mg total) by mouth at bedtime. 30 capsule 5  . gabapentin (NEURONTIN) 100 MG capsule Take 100 mg by mouth as needed.    . gabapentin (NEURONTIN) 300 MG capsule TAKE 1 CAPSULE BY MOUTH ONCE DAILY 30 capsule 2  . Glatiramer Acetate (COPAXONE [place]) Inject 20 mg into the skin at bedtime.    [name] guaiFENesin (MUCINEX) 600 MG 12 hr tablet Take by mouth as needed.    [name] LINZESS 290 MCG CAPS capsule Take 1 capsule by mouth as needed.     . lovastatin (MEVACOR) 40 MG tablet Take 1 tablet by mouth daily.    . metoprolol succinate (TOPROL-XL) 25 MG 24 hr tablet Take 25 mg by mouth at bedtime.     . Multiple Vitamin (MULTIVITAMIN WITH MINERALS) TABS tablet Take 1 tablet by mouth daily.    Gean [place] ER 50 MG TB12 Take 1 tablet by mouth every 12 (twelve) hours.    [name] oxyCODONE-acetaminophen (PERCOCET) 5-325 MG per tablet Take 1  tablet by mouth every 4 (four) hours.     [name] oxymetazoline (AFRIN) 0.05 % nasal spray Place 1 spray into both nostrils 2 (two) times daily as needed for congestion. congestion    . polyethylene glycol powder (GLYCOLAX/MIRALAX) powder Take 17 g by mouth daily as needed. constipation    . PRISTIQ 50 MG 24 hr tablet Take 50 mg by mouth at bedtime.     . simvastatin (ZOCOR) 40 MG tablet Take 1 tablet (40 mg total) by mouth daily. (Patient taking differently: Take 20 mg by mouth at bedtime. ) 30 tablet 0  . ARIPiprazole (ABILIFY) 2 MG tablet Take 2 mg by mouth at bedtime.     [name] tiZANidine (ZANAFLEX) 4 MG tablet Take 2-4 mg by mouth 3 (three) times daily as needed. Reported on  08/30/2015     No current facility-administered medications on file prior to visit.     ALLERGIES: Allergies  Allergen Reactions  . Crestor [Rosuvastatin] Hives and Other (See Comments)    welps  . Amoxicillin Hives    Has patient had a PCN reaction causing immediate rash, facial/tongue/throat swelling, SOB or lightheadedness with hypotension: Yes Has patient had a PCN reaction causing severe rash involving mucus membranes or skin necrosis: No Has patient had a PCN reaction that required hospitalization Yes Has patient had a PCN reaction occurring within the last 10 years: No If all of the above answers are "NO", then may proceed with Cephalosporin use.   . Contrast Media [Iodinated Diagnostic Agents]     Severe rash  . Risedronate Sodium Other (See Comments)    Made her hair fall out.  . Sulfonamide Derivatives Hives    As a child    FAMILY HISTORY: Family History  Problem Relation Age of Onset  . Heart disease Mother 66  . Heart disease Father 66  . Cancer Brother     SOCIAL HISTORY: Social History   Social History  . Marital status: Divorced    Spouse name: N/A  . Number of children: 2  . Years of education: 12th   Occupational History  . disability Retired   Social History Main Topics  . Smoking  status: Former Smoker    Packs/day: 1.00    Years: 48.00    Types: Cigarettes    Quit date: 11/16/2009  . Smokeless tobacco: Never Used  . Alcohol use No  . Drug use: No  . Sexual activity: Not on file   Other Topics Concern  . Not on file   Social History Narrative   Patient lives at home alone.   Caffeine Use: rarely    REVIEW OF SYSTEMS: Constitutional: No fevers now.  Fatigue. Eyes: No visual changes, [name] vision, eye pain Ear, nose and throat: Productive cough, nasal congestion Cardiovascular: No chest pain, palpitations Respiratory:  No shortness of breath at rest or with exertion, wheezes GastrointestinaI: No nausea, vomiting, diarrhea, abdominal pain, fecal incontinence Genitourinary:  No dysuria, urinary retention or frequency Musculoskeletal:  No neck pain, back pain Integumentary: No rash, pruritus, skin lesions Neurological: as above Psychiatric: No depression, insomnia, anxiety Endocrine: No palpitations, fatigue, diaphoresis, mood swings, change in appetite, change in weight, increased thirst Hematologic/Lymphatic:  No purpura, petechiae. Allergic/Immunologic: no itchy/runny eyes, nasal congestion, recent allergic reactions, rashes  PHYSICAL EXAM: Vitals:   05/31/16 1144  BP: 128/76  Pulse: 94   General: No acute distress.  Patient appears well-groomed.   Head:  Normocephalic/atraumatic Eyes:  Fundi examined but not visualized Neck: supple, no paraspinal tenderness, full range of motion Heart:  Regular rate and rhythm Lungs:  Clear to auscultation bilaterally Back: No paraspinal tenderness Neurological Exam: alert and oriented to person, place, and time, recent and remote memory intact, fund of knowledge intact, attention and concentration intact, speech fluent and not dysarthric, language intact.  Left monocular  vision loss.  Endorses mild reduced left sided facial sensation, otherwise CN II-XII intact.  Fundi not visualized.  Bulk and tone normal.   Muscle strength 5/5 throughout.  Decreased pinprick sensation in left upper and lower extremities and decreased vibration sensation in left lower extremity. Finger to nose without dysmetria.  Normal station and stride.  Able to turn.  Unsteady when walking in tandem. Romberg negative.  IMPRESSION: Multiple sclerosis, stable Productive cough  PLAN: 1.  Continue Copaxone 2.  Continue D3 4000 IU daily.  Repeat level in one year 3.  Will refill clonazepam for her until she re-establishes with new PCP.  Will refill Provigil 4.  Repeat MRI of brain with and without contrast in one year.  Follow up afterwards. 5.  Advised that she consider going to Urgent Care for evaluation of her respiratory symptoms if they do not improve.Metta Clines, DO

## 2016-05-31 NOTE — Patient Instructions (Signed)
1.  Continue Copaxone 2.  Refilled clonazepam and Provigil 3.  Take D3 4000 IU daily 4.  Repeat vitamin D and MRI of brain with and without contrast in one year.  Follow up soon afterwards.

## 2016-06-10 ENCOUNTER — Other Ambulatory Visit: Payer: Self-pay | Admitting: Neurology

## 2016-06-11 ENCOUNTER — Other Ambulatory Visit: Payer: Self-pay | Admitting: Neurology

## 2016-07-01 ENCOUNTER — Encounter: Payer: Self-pay | Admitting: Neurology

## 2016-07-10 ENCOUNTER — Other Ambulatory Visit: Payer: Self-pay | Admitting: Neurology

## 2016-08-05 ENCOUNTER — Other Ambulatory Visit: Payer: Self-pay | Admitting: Neurology

## 2016-08-05 NOTE — Telephone Encounter (Signed)
Patient wants [place] to refill her provigil and called into the [facility] patient phone number is [phone] or [phone]

## 2016-08-05 NOTE — Telephone Encounter (Signed)
Please advise. Pt requesting clonopin. Appears to have previously been prescribed elsewhere.

## 2016-08-06 NOTE — Telephone Encounter (Signed)
Refill the Provigil.  I will prescribe her the Klonopin but with no refills (specify on the script that we will not provide further refills).  She will need to establish care soon with a new PCP for further refills.

## 2016-08-29 ENCOUNTER — Other Ambulatory Visit: Payer: Self-pay | Admitting: Neurology

## 2016-09-02 ENCOUNTER — Other Ambulatory Visit: Payer: Self-pay | Admitting: Neurology

## 2016-09-09 DIAGNOSIS — H2513 Age-related nuclear cataract, bilateral: Secondary | ICD-10-CM | POA: Insufficient documentation

## 2016-09-30 ENCOUNTER — Other Ambulatory Visit: Payer: Self-pay | Admitting: Neurology

## 2016-09-30 ENCOUNTER — Other Ambulatory Visit: Payer: Self-pay | Admitting: *Deleted

## 2016-09-30 NOTE — Telephone Encounter (Signed)
Rx sent 

## 2016-10-01 ENCOUNTER — Other Ambulatory Visit: Payer: Self-pay

## 2016-10-01 ENCOUNTER — Other Ambulatory Visit: Payer: Self-pay | Admitting: Neurology

## 2016-10-01 MED ORDER — MODAFINIL 200 MG PO TABS: 200.0000 mg | ORAL_TABLET | Freq: Two times a day (BID) | ORAL | 3 refills | Status: DC

## 2016-10-02 ENCOUNTER — Telehealth: Payer: Self-pay

## 2016-10-02 NOTE — Telephone Encounter (Signed)
Statin should be refilled by PCP

## 2016-10-02 NOTE — Telephone Encounter (Signed)
Left message for [place] that lovastatin will not be filled by Dr Tomi Likens and to refer to PCP.

## 2016-10-02 NOTE — Telephone Encounter (Signed)
Refill request from [facility] for Lovastatin '40mg'$ , one daily.  I don't see where this is being prescribed by Dr Tomi Likens.  Please advise.

## 2016-10-17 ENCOUNTER — Ambulatory Visit (INDEPENDENT_AMBULATORY_CARE_PROVIDER_SITE_OTHER): Payer: Medicare Other | Admitting: Internal Medicine

## 2016-10-17 ENCOUNTER — Encounter: Payer: Self-pay | Admitting: Internal Medicine

## 2016-10-17 VITALS — BP 114/66 | HR 78 | Ht 61.0 in | Wt 125.0 lb

## 2016-10-17 DIAGNOSIS — Z87891 Personal history of nicotine dependence: Secondary | ICD-10-CM

## 2016-10-17 DIAGNOSIS — R0689 Other abnormalities of breathing: Secondary | ICD-10-CM

## 2016-10-17 DIAGNOSIS — R0600 Dyspnea, unspecified: Secondary | ICD-10-CM

## 2016-10-17 DIAGNOSIS — R0989 Other specified symptoms and signs involving the circulatory and respiratory systems: Secondary | ICD-10-CM | POA: Insufficient documentation

## 2016-10-17 DIAGNOSIS — R0789 Other chest pain: Secondary | ICD-10-CM

## 2016-10-17 DIAGNOSIS — R079 Chest pain, unspecified: Secondary | ICD-10-CM

## 2016-10-17 DIAGNOSIS — R911 Solitary pulmonary nodule: Secondary | ICD-10-CM | POA: Diagnosis not present

## 2016-10-17 NOTE — Progress Notes (Signed)
Subjective:     Patient ID: [name], female   DOB: 06/20/1945, 71 y.o.   MRN: [id number]    HPI     OV 06/13/2014   Chief Complaint  Patient presents with  . Follow-up    Pt here for yearly f/u. Pt did not have CT d/t insurance issues. Pt c/o DOE. Pt denies cough and CP/tightness.     Followup  for below issues. Last seen setp 2014  a) LUL Nodule - apical, posterior segment, subpleural first noted dec 2011.  1.3cm. Unchanged as of Sept 2013. No followup CT done - she never showedup. sAys cost was an issue but now wants testing  B) Smoking: has consistently refused to quit as of sept 2014. Butu now is in on e-cig which she says she uses occassionally  C) chronic class 2 exertional dyspnea for which she has refused workup (known to have CAD with EF 45% on cath 2012). EMphysema on CT.  Spirometry today is normal - fev1 1.68L/83%. She continues to be dyspneic; it is not worse or better. Only uses alb prn. I approached stepping up mdi Rx but she quickly denied she is a copd/emphysema patient. Lilkewise, she refused alpha 1 testing.    OV 10/17/2016  Chief Complaint  Patient presents with  . Acute Visit    last ov 06/2014 - c/o burning in her chest x1 year.  has been following up with Dr Einar Gip    Follow-up  Left upper lobe nodule-she has a history of lung nodule in 2011 left upper lobe 1.3 cm. This was unchanged in September 2013. In 2016 she never followed up for CT chest. She is here today and is agreeable to have CT chest.  Smoking: Greater than 30 pack smoking history. She states she is now quit for more than few years  Dyspnea: She is known to have coronary artery disease with ejection fraction 45% on 2012. Review of the chart shows in 2017 she had an echo with improved ejection fraction. She did have normal spirometry in 2016. But other times she refused to have hip. In the interim she says for the last year or so she's had new onset exertional chest pain particularly  when making the bed. Sometimes her walking city block but sometimes the pain is not there. But it does seem that the pain is always exertional. She saw her cardiologist Dr. Einar Gip earlier this week and according to her history he has had this is very likely from coronary artery disease but she does not believe it. Therefore she is here today to figure out if her chest pain is related to lung disease. She does have exertional dyspnea but this is much milder or sometimes even absent. The chest pain is the more dominant feature now. Walking desat test -> 185 feet x 3 laps on RA - no chest pain or burning. Pulse ox 99%, HR went from 80s to 110s  Results for KRISHIKA, BUGGE (MRN [id number]) as of 10/17/2016 11:50  Ref. Range 08/17/2014 10:29 09/17/2014 17:20 05/08/2015 14:41 05/09/2015 05:55 07/25/2015 14:39  Creatinine Latest Ref Range: 0.44 - 1.00 mg/dL 0.95 0.90 0.62  1.19 (H)   Results for ROSALINE, EZEKIEL (MRN [id number]) as of 10/17/2016 11:50  Ref. Range 08/17/2014 10:29 09/17/2014 17:20 05/08/2015 14:41 05/09/2015 05:55 07/25/2015 14:39  Hemoglobin Latest Ref Range: 12.0 - 15.0 g/dL  12.6 12.1  10.8 (L)     has a past medical history of Anxiety; Cervical disc disease; Complication  of anesthesia; Coronary artery disease; Depression; DJD (degenerative joint disease); Dysrhythmia; Fibromyalgia; H/O: liver disease (18 YRS AGO); Heart attack ([place]) (FEW YRS AGO); colonic polyps; Hyperlipidemia; Multiple sclerosis ([place]); Narcotic dependence ([place]); Osteoporosis; Positive H. pylori test; and Urinary incontinence.   reports that she quit smoking about 6 years ago. Her smoking use included Cigarettes. She has a 48.00 pack-year smoking history. She has never used smokeless tobacco.  Past Surgical History:  Procedure Laterality Date  . BACK SURGERY  11-2009   LOWER BACK  . BOTOX INJECTION N/A 03/21/2016   Procedure: BOTOX INJECTION;  Surgeon: Wilford Corner, MD;  Location: [facility];  Service: Endoscopy;   Laterality: N/A;  . COLONSCOPY    . CORONARY STENT PLACEMENT     UNSUCCESSFUL STENT PLACEMENT  . ESOPHAGEAL MANOMETRY N/A 10/23/2015   Procedure: ESOPHAGEAL MANOMETRY (EM);  Surgeon: Wilford Corner, MD;  Location: [facility];  Service: Endoscopy;  Laterality: N/A;  . ESOPHAGOGASTRODUODENOSCOPY (EGD) WITH PROPOFOL N/A 03/21/2016   Procedure: ESOPHAGOGASTRODUODENOSCOPY (EGD) WITH PROPOFOL;  Surgeon: Wilford Corner, MD;  Location: [facility];  Service: Endoscopy;  Laterality: N/A;  . TUBAL LIGATION      Allergies  Allergen Reactions  . Crestor [Rosuvastatin] Hives and Other (See Comments)    welps  . Amoxicillin Hives    Has patient had a PCN reaction causing immediate rash, facial/tongue/throat swelling, SOB or lightheadedness with hypotension: Yes Has patient had a PCN reaction causing severe rash involving mucus membranes or skin necrosis: No Has patient had a PCN reaction that required hospitalization Yes Has patient had a PCN reaction occurring within the last 10 years: No If all of the above answers are "NO", then may proceed with Cephalosporin use.   . Contrast Media [Iodinated Diagnostic Agents]     Severe rash  . Risedronate Sodium Other (See Comments)    Made her hair fall out.  . Sulfonamide Derivatives Hives    As a child    Immunization History  Administered Date(s) Administered  . Influenza Split 02/20/2011, 02/20/2012, 03/03/2014  . Influenza,inj,Quad PF,36+ Mos 02/19/2013  . Pneumococcal-Unspecified 03/03/2014  . Td 06/03/2002    Family History  Problem Relation Age of Onset  . Heart disease Mother 13  . Heart disease Father 40  . Cancer Brother      Current Outpatient Prescriptions:  .  albuterol (PROAIR HFA) 108 (90 BASE) MCG/ACT inhaler, Inhale 2 puffs into the lungs every 6 (six) hours as needed for wheezing or shortness of breath. , Disp: , Rfl:  .  Ascorbic Acid (VITAMIN C) 1000 MG tablet, Take 1,000 mg by mouth daily., Disp: , Rfl:  .   aspirin 325 MG tablet, Take 325 mg by mouth daily., Disp: , Rfl:  .  Cholecalciferol (VITAMIN D3) 3000 UNITS TABS, Take 3,000 Units by mouth daily., Disp: , Rfl:  .  clonazePAM (KLONOPIN) 1 MG tablet, TAKE 1 TABLET BY MOUTH 3 TIMES DAILY, Disp: 90 tablet, Rfl: 0 .  COPAXONE 20 MG/ML SOSY injection, USE 1 INJECTION SUBCUTANEOUSLY ONCE A DAY, Disp: 30 mL, Rfl: 3 .  diphenhydrAMINE (BENADRYL) 25 MG tablet, Take 25 mg by mouth at bedtime as needed. , Disp: , Rfl:  .  doxepin (SINEQUAN) 25 MG capsule, Take 1 capsule (25 mg total) by mouth at bedtime., Disp: 30 capsule, Rfl: 5 .  gabapentin (NEURONTIN) 300 MG capsule, TAKE 1 CAPSULE BY MOUTH ONCE DAILY, Disp: 90 capsule, Rfl: 1 .  guaiFENesin (MUCINEX) 600 MG 12 hr tablet, Take by mouth as needed.,  Disp: , Rfl:  .  LINZESS 290 MCG CAPS capsule, Take 1 capsule by mouth as needed. , Disp: , Rfl:  .  lovastatin (MEVACOR) 40 MG tablet, Take 1 tablet by mouth daily., Disp: , Rfl:  .  metoprolol succinate (TOPROL-XL) 25 MG 24 hr tablet, Take 25 mg by mouth at bedtime. , Disp: , Rfl:  .  modafinil (PROVIGIL) 200 MG tablet, Take 1 tablet (200 mg total) by mouth 2 (two) times daily., Disp: 60 tablet, Rfl: 3 .  Multiple Vitamin (MULTIVITAMIN WITH MINERALS) TABS tablet, Take 1 tablet by mouth daily., Disp: , Rfl:  .  NUCYNTA ER 50 MG TB12, Take 1 tablet by mouth every 12 (twelve) hours., Disp: , Rfl:  .  oxyCODONE-acetaminophen (PERCOCET) 5-325 MG per tablet, Take 1 tablet by mouth every 4 (four) hours. , Disp: , Rfl:  .  oxymetazoline (AFRIN) 0.05 % nasal spray, Place 1 spray into both nostrils 2 (two) times daily as needed for congestion. congestion, Disp: , Rfl:  .  polyethylene glycol powder (GLYCOLAX/MIRALAX) powder, Take 17 g by mouth daily as needed. constipation, Disp: , Rfl:  .  PRISTIQ 50 MG 24 hr tablet, Take 50 mg by mouth at bedtime. , Disp: , Rfl:  .  simvastatin (ZOCOR) 40 MG tablet, Take 1 tablet (40 mg total) by mouth daily. (Patient taking  differently: Take 20 mg by mouth at bedtime. ), Disp: 30 tablet, Rfl: 0 .  tiZANidine (ZANAFLEX) 4 MG tablet, Take 2-4 mg by mouth 3 (three) times daily as needed. Reported on 08/30/2015, Disp: , Rfl:      Review of Systems     Objective:   Physical Exam  Constitutional: She is oriented to person, place, and time. She appears well-developed and well-nourished. No distress.  HENT:  Head: Normocephalic and atraumatic.  Right Ear: External ear normal.  Left Ear: External ear normal.  Mouth/Throat: Oropharynx is clear and moist. No oropharyngeal exudate.  Eyes: Conjunctivae and EOM are normal. Pupils are equal, round, and reactive to light. Right eye exhibits no discharge. Left eye exhibits no discharge. No scleral icterus.  Neck: Normal range of motion. Neck supple. No JVD present. No tracheal deviation present. No thyromegaly present.  Cardiovascular: Normal rate, regular rhythm, normal heart sounds and intact distal pulses.  Exam reveals no gallop and no friction rub.   No murmur heard. Pulmonary/Chest: Effort normal. No respiratory distress. She has no wheezes. She has rales. She exhibits no tenderness.  Abdominal: Soft. Bowel sounds are normal. She exhibits no distension and no mass. There is no tenderness. There is no rebound and no guarding.  Musculoskeletal: Normal range of motion. She exhibits no edema or tenderness.  Lymphadenopathy:    She has no cervical adenopathy.  Neurological: She is alert and oriented to person, place, and time. She has normal reflexes. No cranial nerve deficit. She exhibits normal muscle tone. Coordination normal.  Skin: Skin is warm and dry. No rash noted. She is not diaphoretic. No erythema. No pallor.  Psychiatric: She has a normal mood and affect. Her behavior is normal. Judgment and thought content normal.  Poor historian  Vitals reviewed.  Vitals:   10/17/16 1143  BP: 114/66  Pulse: 78  SpO2: 94%  Weight: 125 lb (56.7 kg)  Height: '5\' 1"'$  (1.549  m)        Assessment:       ICD-9-CM ICD-10-CM   1. History of smoking 25-50 pack years V15.82 Z87.891   2. Chest pain  of uncertain etiology 773.73 R07.89   3. Bibasilar crackles 786.7 R09.89   4. Nodule of left lung 793.11 R91.1   5. Dyspnea and respiratory abnormality 786.09 R06.00     R06.89   I explained to her that the chest pain is likely cardiac in etiology and she should follow the advice of her cardiologist. Nevertheless given a history of smoking and crackles and previous lung nodule and dyspnea she has strong indication to full pulmonary function test and high resolution CT chest rule out interstitial lung disease versus emphysema and assess  the current status of the lung nodule     Plan:     Do HRCT Do PFT  Fu Return to see APP Tammy after above  Dr. Brand Males, M.D., [facility].C.P Pulmonary and Critical Care Medicine Staff Physician [facility] Pager: [phone], If no answer or between  15:00h - 7:00h: call [phone]  10/17/2016 12:05 PM

## 2016-10-17 NOTE — Addendum Note (Signed)
Addended by: Parke Poisson E on: 10/17/2016 12:15 PM   Modules accepted: Orders

## 2016-10-17 NOTE — Patient Instructions (Addendum)
ICD-9-CM ICD-10-CM   1. History of smoking 25-50 pack years V15.82 Z87.891   2. Chest pain of uncertain etiology 185.63 R07.89   3. Bibasilar crackles 786.7 R09.89   4. Nodule of left lung 793.11 R91.1   5. Dyspnea and respiratory abnormality 786.09 R06.00     R06.89     Do HRCT and Do fullDo  PFT - next few days to few weeks Do walk test on room air in office 10/17/2016  followup Return to see APP Tammy after above

## 2016-10-24 ENCOUNTER — Inpatient Hospital Stay: Admission: RE | Admit: 2016-10-24 | Payer: Self-pay | Source: Ambulatory Visit

## 2016-10-29 ENCOUNTER — Inpatient Hospital Stay: Admission: RE | Admit: 2016-10-29 | Payer: Self-pay | Source: Ambulatory Visit

## 2016-10-29 ENCOUNTER — Other Ambulatory Visit: Payer: Self-pay | Admitting: Neurology

## 2016-11-01 ENCOUNTER — Ambulatory Visit: Payer: Self-pay | Admitting: Adult Health

## 2016-12-23 ENCOUNTER — Inpatient Hospital Stay: Admission: RE | Admit: 2016-12-23 | Payer: Self-pay | Source: Ambulatory Visit

## 2016-12-25 ENCOUNTER — Other Ambulatory Visit: Payer: Self-pay | Admitting: Neurology

## 2017-01-06 ENCOUNTER — Ambulatory Visit: Payer: Self-pay | Admitting: Adult Health

## 2017-01-20 ENCOUNTER — Other Ambulatory Visit: Payer: Self-pay | Admitting: Neurology

## 2017-01-29 ENCOUNTER — Telehealth: Payer: Self-pay | Admitting: Neurology

## 2017-01-29 NOTE — Telephone Encounter (Signed)
Pt was on the phone with me when you were returning her call and missed your call so please call her back

## 2017-01-29 NOTE — Telephone Encounter (Signed)
Called and spoke w/Pt, she states her head is shaking worse, saw the dentist and was noticed there. Increased speech problems, unable to pronounce certain words, couldn't pronounce [place]. Has appointment in Dec, would like to be seen in the next month if possible. Ok to have her come in sooner?

## 2017-01-29 NOTE — Telephone Encounter (Signed)
PT called and said her head is shaking a lot and having problems with her words, wants to come in and see Dr Tomi Likens sooner, please advise

## 2017-01-30 NOTE — Telephone Encounter (Signed)
Called Pt to advise we have an opening 02/10/17 @ 8am, Pt will come @ that time.

## 2017-01-30 NOTE — Telephone Encounter (Signed)
That is okay.

## 2017-02-10 ENCOUNTER — Ambulatory Visit (INDEPENDENT_AMBULATORY_CARE_PROVIDER_SITE_OTHER)
Admission: RE | Admit: 2017-02-10 | Discharge: 2017-02-10 | Disposition: A | Discharge status: Home / Self Care | Payer: Medicare Other | Source: Ambulatory Visit | Attending: Internal Medicine | Admitting: Internal Medicine

## 2017-02-10 ENCOUNTER — Ambulatory Visit: Payer: Self-pay | Admitting: Adult Health

## 2017-02-10 ENCOUNTER — Ambulatory Visit (INDEPENDENT_AMBULATORY_CARE_PROVIDER_SITE_OTHER): Payer: Medicare Other | Admitting: Neurology

## 2017-02-10 ENCOUNTER — Encounter: Payer: Self-pay | Admitting: Neurology

## 2017-02-10 VITALS — BP 128/78 | HR 70 | Ht 61.0 in | Wt 115.9 lb

## 2017-02-10 DIAGNOSIS — R0689 Other abnormalities of breathing: Secondary | ICD-10-CM

## 2017-02-10 DIAGNOSIS — G250 Essential tremor: Secondary | ICD-10-CM

## 2017-02-10 DIAGNOSIS — R911 Solitary pulmonary nodule: Secondary | ICD-10-CM

## 2017-02-10 DIAGNOSIS — R413 Other amnesia: Secondary | ICD-10-CM | POA: Diagnosis not present

## 2017-02-10 DIAGNOSIS — R0789 Other chest pain: Secondary | ICD-10-CM | POA: Diagnosis not present

## 2017-02-10 DIAGNOSIS — G35 Multiple sclerosis: Secondary | ICD-10-CM | POA: Diagnosis not present

## 2017-02-10 DIAGNOSIS — R079 Chest pain, unspecified: Secondary | ICD-10-CM

## 2017-02-10 DIAGNOSIS — R0989 Other specified symptoms and signs involving the circulatory and respiratory systems: Secondary | ICD-10-CM | POA: Diagnosis not present

## 2017-02-10 DIAGNOSIS — R0600 Dyspnea, unspecified: Secondary | ICD-10-CM

## 2017-02-10 DIAGNOSIS — Z87891 Personal history of nicotine dependence: Secondary | ICD-10-CM | POA: Diagnosis not present

## 2017-02-10 NOTE — Progress Notes (Signed)
NEUROLOGY FOLLOW UP OFFICE NOTE  [name] [id number]  HISTORY OF PRESENT ILLNESS: [name] is a 71 year old right-handed woman with CAD, thyroid disease, IBS, scar tissue of left lung, arthritis, fibromyalgia, depression, MS and history of BPPV who follows up for MS.    UPDATE: Current medications: Copaxone 20mg  [place] daily Gabapentin 100mg  at bedtime (300mg  causes drowsiness) Clonazepam 1mg  during day and 2mg  at bedtime for spasms D3 4000 IU daily Provigil 200mg    Since about 6 months ago, she has had problems with word-finding and word production.  She has trouble pronouncing words.  She also reports increased short-term memory problems.  She needs to write down appointments on the calendar.  On one occasion, she had to think about who to travel on a familiar route.  She also has increased intermittent head shaking.  The head shaking has been ongoing for several years.   HISTORY: First symptom occurred in 1995, when she developed vision loss in the left eye, as well as left eye pain.  MRI of the brain with and without contrast was performed on 03/28/95 revealed 10-12 periventricular white matter lesions and one lesion in the corpus callosum, suspicious for demyelination.  There was also enhancement of the left optic nerve.  She was referred to Dr. Delphia Grates, an [place] specialist.  Lumbar puncture was performed, revealing no oligoclonal bands or myelin basic protein.  She has not required hospitalization or steroids since the late 1990s.  She was on Betaseron and later Copaxone.  Copaxone was discontinued due to longstanding stability, but due to fatigue and cognitive issues, it was restarted in 2014.  Her continued symptoms are episodes of worsening fatigue, as well as cognitive issues.  Sometimes, she has difficulty thinking clearly.  She will get disoriented while driving.  Other times, she has word-finding difficulties.  She once overpaid a bill.  She still performs all ADLs,  including paying the bills.  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  Sometimes, when she feels symptoms are worse, she sees a white "half a doughnut" shape in her visual field.     Current disease-modifying agent:  Copaxone 20mg  [place] daily Past disease-modifying agents:  Betaseron (elevated LFTs and necrotic injection sites)   For fatigue, she takes Provigil.  Adderal was ineffective.  Amantadine was ineffective. For pain in the arms and legs, she takes gabapentin 300mg  at bedtime.  She reportedly has lumbar radiculopathy which also contributes to the leg pain. Initially, she reportedly had significant spasticity.  She was on oral baclofen which did not help.  At one point, a baclofen pump was entertained.  She currently takes clonazepam 1mg  during the day and 2mg  at bedtime, which is effective. She sometimes requires ambulation with a cane or walker.  On occasion, she has fallen. She has permanent vision loss in the left eye.   Other imaging: 05/07/95 MRI LUMBAR WO:  mild disc bulging at L4-5 without disc herniation or central canal stenosis. 03/09/96 MRI BRAIN W/WO (comparing to MRI from 03/28/95):  slight increase in number of white matter lesions adjacent to the atrium of the right lateral ventricle, otherwise unchanged. 03/28/98 MRI BRAIN W/WO (comparing to MRI from 03/25/97):  several subtle areas of increased signal in the deep white matter, not as prevalent as on prior study.  No abnormal enhancement. 08/11/13 MRI of brain with and without contrast :  multiple supratentorial and infratentorial T2 hyperintensities, but no post-contrast enhancement.  However,  this was not compared to prior imaging. 01/28/14 MRI Cervical spine without contrast:  spinal cord unremarkable.[name] MRI of brain with and without contrast from 08/17/14 is stable MRI of brain without contrast from 05/08/15 is stable.  PAST MEDICAL HISTORY: Past Medical History:  Diagnosis Date  . Anxiety     . Cervical disc disease    BULGING  . Complication of anesthesia    SLOW TO AWAKEN AFTER LAST COLONSCOPY  . Coronary artery disease   . Depression   . DJD (degenerative joint disease)    OA  . Dysrhythmia   . Fibromyalgia   . H/O: liver disease 65 YRS AGO   previous interferon induced  . Heart attack ([place]) FEW YRS AGO  . Hx of colonic polyps   . Hyperlipidemia   . Multiple sclerosis ([place])   . Narcotic dependence ([facility])    hx of benzodiazepine and narcotic  . Osteoporosis   . Positive H. pylori test   . Urinary incontinence     MEDICATIONS: Current Outpatient Prescriptions on File Prior to Visit  Medication Sig Dispense Refill  . albuterol (PROAIR HFA) 108 (90 BASE) MCG/ACT inhaler Inhale 2 puffs into the lungs every 6 (six) hours as needed for wheezing or shortness of breath.     . Ascorbic Acid (VITAMIN C) 1000 MG tablet Take 1,000 mg by mouth daily.    [name] aspirin 325 MG tablet Take 325 mg by mouth daily.    . Cholecalciferol (VITAMIN D3) 3000 UNITS TABS Take 3,000 Units by mouth daily.    . clonazePAM (KLONOPIN) 1 MG tablet TAKE 1 TABLET BY MOUTH 3 TIMES DAILY 90 tablet 0  . diphenhydrAMINE (BENADRYL) 25 MG tablet Take 25 mg by mouth at bedtime as needed.     . doxepin (SINEQUAN) 25 MG capsule Take 1 capsule (25 mg total) by mouth at bedtime. 30 capsule 5  . gabapentin (NEURONTIN) 300 MG capsule TAKE 1 CAPSULE BY MOUTH ONCE DAILY 90 capsule 1  . GLATOPA 20 MG/ML SOSY injection USE 1 INJECTION SUBCUTANEOUSLY ONCE A DAY 30 mL 11  . guaiFENesin (MUCINEX) 600 MG 12 hr tablet Take by mouth as needed.    [name] LINZESS 290 MCG CAPS capsule Take 1 capsule by mouth as needed.     . lovastatin (MEVACOR) 40 MG tablet Take 1 tablet by mouth daily.    . modafinil (PROVIGIL) 200 MG tablet TAKE 1 TABLET BY MOUTH TWICE A DAY 60 tablet 2  . Multiple Vitamin (MULTIVITAMIN WITH MINERALS) TABS tablet Take 1 tablet by mouth daily.    Gean [place] ER 50 MG TB12 Take 1 tablet by mouth every 12 (twelve)  hours.    [name] oxyCODONE-acetaminophen (PERCOCET) 5-325 MG per tablet Take 1 tablet by mouth every 4 (four) hours.     [name] oxymetazoline (AFRIN) 0.05 % nasal spray Place 1 spray into both nostrils 2 (two) times daily as needed for congestion. congestion    . polyethylene glycol powder (GLYCOLAX/MIRALAX) powder Take 17 g by mouth daily as needed. constipation    . PRISTIQ 50 MG 24 hr tablet Take 50 mg by mouth at bedtime.     . simvastatin (ZOCOR) 40 MG tablet Take 1 tablet (40 mg total) by mouth daily. (Patient taking differently: Take 20 mg by mouth at bedtime. ) 30 tablet 0  . tiZANidine (ZANAFLEX) 4 MG tablet Take 2-4 mg by mouth 3 (three) times daily as needed. Reported on 08/30/2015     No current facility-administered medications on  file prior to visit.     ALLERGIES: Allergies  Allergen Reactions  . Crestor [Rosuvastatin] Hives and Other (See Comments)    welps  . Amoxicillin Hives    Has patient had a PCN reaction causing immediate rash, facial/tongue/throat swelling, SOB or lightheadedness with hypotension: Yes Has patient had a PCN reaction causing severe rash involving mucus membranes or skin necrosis: No Has patient had a PCN reaction that required hospitalization Yes Has patient had a PCN reaction occurring within the last 10 years: No If all of the above answers are "NO", then may proceed with Cephalosporin use.   . Contrast Media [Iodinated Diagnostic Agents]     Severe rash  . Risedronate Sodium Other (See Comments)    Made her hair fall out.  . Sulfonamide Derivatives Hives    As a child    FAMILY HISTORY: Family History  Problem Relation Age of Onset  . Heart disease Mother 32  . Heart disease Father 20  . Cancer Brother     SOCIAL HISTORY: Social History   Social History  . Marital status: Divorced    Spouse name: N/A  . Number of children: 2  . Years of education: 12th   Occupational History  . disability Retired   Social History Main Topics  .  Smoking status: Former Smoker    Packs/day: 1.00    Years: 48.00    Types: Cigarettes    Quit date: 11/16/2009  . Smokeless tobacco: Never Used  . Alcohol use No  . Drug use: No  . Sexual activity: Not on file   Other Topics Concern  . Not on file   Social History Narrative   Patient lives at home alone.   Caffeine Use: rarely    REVIEW OF SYSTEMS: Constitutional: No fevers, chills, or sweats, no generalized fatigue, change in appetite Eyes: No visual changes, double vision, eye pain Ear, nose and throat: No hearing loss, ear pain, nasal congestion, sore throat Cardiovascular: No chest pain, palpitations Respiratory:  No shortness of breath at rest or with exertion, wheezes GastrointestinaI: No nausea, vomiting, diarrhea, abdominal pain, fecal incontinence Genitourinary:  No dysuria, urinary retention or frequency Musculoskeletal:  No neck pain, back pain Integumentary: No rash, pruritus, skin lesions Neurological: as above Psychiatric: No depression, insomnia, anxiety Endocrine: No palpitations, fatigue, diaphoresis, mood swings, change in appetite, change in weight, increased thirst Hematologic/Lymphatic:  No purpura, petechiae. Allergic/Immunologic: no itchy/runny eyes, nasal congestion, recent allergic reactions, rashes  PHYSICAL EXAM: Vitals:   02/10/17 0738  BP: 128/78  Pulse: 70  SpO2: 97%   General: No acute distress.  Patient appears well-groomed.  normal body habitus. Head:  Normocephalic/atraumatic Eyes:  Fundi examined but not visualized Neck: supple, no paraspinal tenderness, full range of motion Heart:  Regular rate and rhythm Lungs:  Clear to auscultation bilaterally Back: No paraspinal tenderness Neurological Exam: alert and oriented to person, place, and time, recent and remote memory intact, fund of knowledge intact, attention and concentration intact, speech fluent and not dysarthric, repetition mildly incorrect.  Able to copy intersecting pentagons  but had difficulty copying a cube.  Able to complete Trail Making Test and clock.  Left monocular  vision loss.  Otherwise, CN II-XII intact.  Fundi not visualized.  Bulk and tone normal.  Muscle strength 5/5 throughout. Temperature and vibration intact. Finger to nose without dysmetria.  Unsteady gait with left limp (due to "pinched nerve").   Romberg negative.  IMPRESSION: Multiple sclerosis Memory and word-finding deficits.  Likely  related to neurocognitive impairment due to MS.  At this time, she would like to defer neuropsychological testing. Benign head tremor- at this time,she would like to defer medication.  PLAN: 1.  MRI of brain with and without contrast 2.  Check vitamin D and B12 3.  Continue Copaxone, vitamin D 4.  If MRI reveals MS progression, will have her follow up to discuss medication changes.  Otherwise, she will follow up in 6 months.  26 minutes spent face to face with patient, over 50% spent discussing symptoms and testing.  Metta Clines, DO  CC:  Donald Prose, MD

## 2017-02-10 NOTE — Patient Instructions (Signed)
1.  Check MRI of brain with and without contrast 2.  Check vitamin B12 and D level 3.  Follow up in 6 months but will bring you in sooner depending on MRI results.

## 2017-02-11 ENCOUNTER — Telehealth: Payer: Self-pay | Admitting: Neurology

## 2017-02-11 DIAGNOSIS — G35 Multiple sclerosis: Secondary | ICD-10-CM

## 2017-02-11 NOTE — Telephone Encounter (Signed)
[name] called from [place] [facility] needing to speak with you regarding this patient and her being very Allergic to the contrast for the MRI. She said she would probably need it done at the hospital if she were to have the [place] because of the treatment she would need. They said it is a really bad reaction. Please Call. Thanks

## 2017-02-11 NOTE — Telephone Encounter (Signed)
Called and spoke w/Brianna, she stated when she called to schedule the MRI the Pt advsd her the first time she had contrast her lungs felt like they were on fire for a week, the second time, she had blisters all over her skin. [place] [facility] protocol is to have any Pt with this type of reaction to be scheduled at the hospital where there is adequate medical staff to address such reactions, or change to w/out. Pls advise

## 2017-02-12 NOTE — Telephone Encounter (Signed)
Schedule at hospital.

## 2017-02-15 ENCOUNTER — Encounter: Payer: Self-pay | Admitting: Neurology

## 2017-02-17 NOTE — Telephone Encounter (Signed)
Pt wants to know when and where her MRI is to be done, please call and advise

## 2017-02-18 ENCOUNTER — Other Ambulatory Visit: Payer: Medicare Other

## 2017-02-18 DIAGNOSIS — R413 Other amnesia: Secondary | ICD-10-CM

## 2017-02-18 NOTE — Addendum Note (Signed)
Addended by: Clois Comber on: 02/18/2017 11:12 AM   Modules accepted: Orders

## 2017-02-19 ENCOUNTER — Telehealth: Payer: Self-pay | Admitting: Internal Medicine

## 2017-02-19 NOTE — Telephone Encounter (Signed)
   [name]  You are seeing her 02/24/17 - if cardiac workup fine, then recommend CPST for dyspnea/chest pain. Or can encourae her to try spiriva (she refused in past) .  SHe will hae PFT on day you see her  Thanks  Dr. Brand Males, M.D., [facility].C.P Pulmonary and Critical Care Medicine Staff Physician [place] [facility] Pager: [phone], If no answer or between  15:00h - 7:00h: call [phone]  02/19/2017 1:45 PM    IMPRESSION:ct chest 1. No findings to explain the patient's given symptoms. 2. Aortic atherosclerosis (ICD10-170.0). Coronary artery calcification. 3. Emphysema (ICD10-J43.9).   Electronically Signed By: Lorin Picket M.D. On: 02/10/2017 13:29

## 2017-02-19 NOTE — Telephone Encounter (Signed)
FYI TP

## 2017-02-20 ENCOUNTER — Telehealth: Payer: Self-pay

## 2017-02-20 NOTE — Telephone Encounter (Signed)
-----   Message from Pieter Partridge, DO sent at 02/19/2017  6:59 AM EDT ----- B12 is normal

## 2017-02-20 NOTE — Telephone Encounter (Signed)
Spoke with Pt, advsd her B12 was normal

## 2017-02-22 LAB — VITAMIN B12: Vitamin B-12: 606 pg/mL (ref 200–1100)

## 2017-02-22 LAB — VITAMIN D 1,25 DIHYDROXY
VITAMIN D 1, 25 (OH) TOTAL: 40 pg/mL (ref 18–72)
Vitamin D2 1, 25 (OH)2: <8 pg/mL
Vitamin D3 1, 25 (OH)2: 40 pg/mL

## 2017-02-24 ENCOUNTER — Telehealth: Payer: Self-pay

## 2017-02-24 ENCOUNTER — Ambulatory Visit: Payer: Self-pay | Admitting: Adult Health

## 2017-02-24 NOTE — Telephone Encounter (Signed)
LM on VM for Pt to rtrn call

## 2017-02-24 NOTE — Telephone Encounter (Signed)
-----   Message from Pieter Partridge, DO sent at 02/24/2017  6:57 AM EDT ----- Labs are ok

## 2017-02-25 ENCOUNTER — Other Ambulatory Visit: Payer: Self-pay

## 2017-02-25 MED ORDER — DIPHENHYDRAMINE HCL 25 MG PO CAPS: 50.0000 mg | ORAL_CAPSULE | Freq: Once | ORAL | 0 refills | Status: DC

## 2017-02-25 MED ORDER — PREDNISONE 50 MG PO TABS: ORAL_TABLET | ORAL | 0 refills | Status: DC

## 2017-02-25 NOTE — Progress Notes (Signed)
Spoke with Peggy at centralized scheduling at [facility], Pt is scheduled for 03/04/17 at 4pm

## 2017-02-27 ENCOUNTER — Telehealth: Payer: Self-pay | Admitting: Neurology

## 2017-02-27 NOTE — Telephone Encounter (Signed)
Patient wants to talk to someone about not hearing from anyone about getting her MRI sch

## 2017-02-27 NOTE — Telephone Encounter (Signed)
Called and spoke with Pt about MRI, advsd her I had sent her an email, advsd her it was scheduled on 03/04/17 at 4pm, arrival time 3:30pm, advsd her of 13 hr prep and was called into [facility] on [address]

## 2017-03-03 ENCOUNTER — Telehealth: Payer: Self-pay | Admitting: Neurology

## 2017-03-03 NOTE — Telephone Encounter (Signed)
Patient lmom to let Dr. Tomi Likens know that she is concerned about having the Dye again. She is scheduled for MRI tomorrow 03/04/17 at 4:00 PM. Please Advise. Thanks

## 2017-03-03 NOTE — Telephone Encounter (Signed)
Called and talked with Pt, reassured her that the hospital was equipped for reactions and that was what the prep was for. Pt was much more calm, reminded her she will need a driver since she will be taking 50 mg of benadryl also

## 2017-03-04 ENCOUNTER — Encounter: Payer: Self-pay | Admitting: Neurology

## 2017-03-04 ENCOUNTER — Ambulatory Visit (HOSPITAL_COMMUNITY)
Admission: RE | Admit: 2017-03-04 | Discharge: 2017-03-04 | Disposition: A | Discharge status: Home / Self Care | Payer: Medicare Other | Source: Ambulatory Visit | Attending: Neurology | Admitting: Neurology

## 2017-03-04 DIAGNOSIS — G35 Multiple sclerosis: Secondary | ICD-10-CM | POA: Insufficient documentation

## 2017-03-04 LAB — CREATININE, SERUM
Creatinine, Ser: 0.67 mg/dL (ref 0.44–1.00)
GFR calc non Af Amer: >60 mL/min (ref >60)

## 2017-03-04 MED ORDER — GADOBENATE DIMEGLUMINE 529 MG/ML IV SOLN
10.0000 mL | Freq: Once | INTRAVENOUS | Status: AC | PRN
  Administered 2017-03-04: 10 mL via INTRAVENOUS

## 2017-03-05 ENCOUNTER — Encounter: Payer: Self-pay | Admitting: Neurology

## 2017-03-05 ENCOUNTER — Telehealth: Payer: Self-pay

## 2017-03-05 NOTE — Telephone Encounter (Signed)
-----   Message from Pieter Partridge, DO sent at 03/05/2017  7:05 AM EDT ----- MRI of brain is stable compared to prior MRI from almost 2 years ago.  There is no evidence of disease progression, which is good.

## 2017-03-05 NOTE — Telephone Encounter (Signed)
Called Pt to check on her, she said her face was still swollen and red, I asked how much benadryl she has taken today, she states 50mg  this am, 50mg  this afternoon. I advsd her if she worsens or has any issues breathing or swallowing, to immediately call 911.  Pt agreed

## 2017-03-05 NOTE — Telephone Encounter (Signed)
Called Pt, advsd her of MRI results

## 2017-03-06 ENCOUNTER — Encounter: Payer: Self-pay | Admitting: Neurology

## 2017-03-14 NOTE — Telephone Encounter (Signed)
[name]  pls reschedule with me first avail  Dr. Brand Males, M.D., [facility].C.P Pulmonary and Critical Care Medicine Staff Physician [place] [facility] Pager: [phone], If no answer or between  15:00h - 7:00h: call [phone]  03/14/2017 9:02 PM

## 2017-03-14 NOTE — Telephone Encounter (Signed)
She never came for follow up

## 2017-03-17 NOTE — Telephone Encounter (Signed)
Spoke with patient, she states that she does not want to come in at the moment due to having an UTI. Patient states that she will call in once she has completed the antibiotics and is feeling better, she will then call and schedule an appointment. Offered patient a tentative appt that she could cancel if need be and she declined.

## 2017-03-17 NOTE — Telephone Encounter (Signed)
Called pt to get her scheduled with MR at his next avail appt but pt stated to me that due to going to the doctor a lot recently due to UTI's and other issues she has had going on, pt wants to wait until November to come in for an appt.  MR, please advise if you are okay for her to wait until November to be seen or does she really need to be seen this month.  Thanks!

## 2017-03-21 NOTE — Telephone Encounter (Signed)
ok 

## 2017-04-15 ENCOUNTER — Other Ambulatory Visit: Payer: Self-pay | Admitting: Neurology

## 2017-04-22 ENCOUNTER — Ambulatory Visit: Payer: Self-pay | Admitting: Internal Medicine

## 2017-04-23 ENCOUNTER — Telehealth: Payer: Self-pay

## 2017-04-23 NOTE — Telephone Encounter (Signed)
Rcvd drug utilization review from [facility] for provigal the patient is taking. She is taking 2QD, should only be taken 1 QD. Called Pt, she states she would benefit for taking it more than 2 QD, is still lethargic, no anxiety, restlessness, headaches, or insomnia. Wants to continue 2 QD. Discussed with Dr Tomi Likens, he stated Pt came to him on that dose, she has been taking it  2 QD w/out any problems or side effects, is OK to continue. Called Pt to advise, cautioned her NOT to take more than 2 QD. Pt verbalized understanding.

## 2017-05-07 ENCOUNTER — Other Ambulatory Visit: Payer: Self-pay | Admitting: Family Medicine

## 2017-05-07 DIAGNOSIS — E041 Nontoxic single thyroid nodule: Secondary | ICD-10-CM

## 2017-05-14 ENCOUNTER — Other Ambulatory Visit: Payer: Self-pay

## 2017-05-20 ENCOUNTER — Ambulatory Visit
Admission: RE | Admit: 2017-05-20 | Discharge: 2017-05-20 | Disposition: A | Discharge status: Home / Self Care | Payer: Medicare Other | Source: Ambulatory Visit | Attending: Family Medicine | Admitting: Family Medicine

## 2017-05-20 DIAGNOSIS — E041 Nontoxic single thyroid nodule: Secondary | ICD-10-CM

## 2017-05-28 ENCOUNTER — Ambulatory Visit: Payer: Self-pay | Admitting: Neurology

## 2017-06-02 ENCOUNTER — Ambulatory Visit: Payer: Self-pay | Admitting: Neurology

## 2017-06-13 ENCOUNTER — Telehealth: Payer: Self-pay | Admitting: Neurology

## 2017-06-13 NOTE — Telephone Encounter (Signed)
Patient called and Lmom needing to get Prior Auth for her medication for the first of the year. Please Call. Thanks

## 2017-06-16 NOTE — Telephone Encounter (Signed)
Called and spoke to Pt, she needs authorization for Provigil, advsd her will start.

## 2017-06-18 NOTE — Telephone Encounter (Signed)
Rcvd approval [id number] for modafinil tab 200mg  thru 12/15/2017

## 2017-07-17 ENCOUNTER — Other Ambulatory Visit: Payer: Self-pay | Admitting: Neurology

## 2017-07-17 NOTE — Telephone Encounter (Signed)
Pt left a message regarding her prescription for Provigil and if the pharmacy faxed over a request

## 2017-07-17 NOTE — Telephone Encounter (Signed)
Patient needing RX provigial called in today

## 2017-07-22 DIAGNOSIS — E042 Nontoxic multinodular goiter: Secondary | ICD-10-CM | POA: Insufficient documentation

## 2017-08-06 ENCOUNTER — Ambulatory Visit: Payer: Self-pay | Admitting: Internal Medicine

## 2017-08-08 ENCOUNTER — Ambulatory Visit: Payer: Medicare Other | Admitting: Internal Medicine

## 2017-08-08 ENCOUNTER — Encounter: Payer: Self-pay | Admitting: Internal Medicine

## 2017-08-08 VITALS — BP 126/74 | HR 74 | Ht 61.0 in | Wt 119.8 lb

## 2017-08-08 DIAGNOSIS — J439 Emphysema, unspecified: Secondary | ICD-10-CM

## 2017-08-08 MED ORDER — TIOTROPIUM BROMIDE MONOHYDRATE 18 MCG IN CAPS: 18.0000 ug | ORAL_CAPSULE | Freq: Every day | RESPIRATORY_TRACT | 6 refills | Status: DC

## 2017-08-08 NOTE — Progress Notes (Signed)
Subjective:     Patient ID: [name], female   DOB: 04-24-1946, 72 y.o.   MRN: [id number]  HPI    OV 06/13/2014   Chief Complaint  Patient presents with  . Follow-up    Pt here for yearly f/u. Pt did not have CT d/t insurance issues. Pt c/o DOE. Pt denies cough and CP/tightness.     Followup  for below issues. Last seen setp 2014  a) LUL Nodule - apical, posterior segment, subpleural first noted dec 2011.  1.3cm. Unchanged as of Sept 2013. No followup CT done - she never showedup. sAys cost was an issue but now wants testing  B) Smoking: has consistently refused to quit as of sept 2014. Butu now is in on e-cig which she says she uses occassionally  C) chronic class 2 exertional dyspnea for which she has refused workup (known to have CAD with EF 45% on cath 2012). EMphysema on CT.  Spirometry today is normal - fev1 1.68L/83%. She continues to be dyspneic; it is not worse or better. Only uses alb prn. I approached stepping up mdi Rx but she quickly denied she is a copd/emphysema patient. Lilkewise, she refused alpha 1 testing.    OV 10/17/2016  Chief Complaint  Patient presents with  . Acute Visit    last ov 06/2014 - c/o burning in her chest x1 year.  has been following up with Dr Einar Gip    Follow-up  Left upper lobe nodule-she has a history of lung nodule in 2011 left upper lobe 1.3 cm. This was unchanged in September 2013. In 2016 she never followed up for CT chest. She is here today and is agreeable to have CT chest.  Smoking: Greater than 30 pack smoking history. She states she is now quit for more than few years  Dyspnea: She is known to have coronary artery disease with ejection fraction 45% on 2012. Review of the chart shows in 2017 she had an echo with improved ejection fraction. She did have normal spirometry in 2016. But other times she refused to have hip. In the interim she says for the last year or so she's had new onset exertional chest pain particularly when  making the bed. Sometimes her walking city block but sometimes the pain is not there. But it does seem that the pain is always exertional. She saw her cardiologist Dr. Einar Gip earlier this week and according to her history he has had this is very likely from coronary artery disease but she does not believe it. Therefore she is here today to figure out if her chest pain is related to lung disease. She does have exertional dyspnea but this is much milder or sometimes even absent. The chest pain is the more dominant feature now. Walking desat test -> 185 feet x 3 laps on RA - no chest pain or burning. Pulse ox 99%, HR went from 80s to 110s   OV 08/08/2017  Chief Complaint  Patient presents with  . Follow-up    SOB improving after seeing heart doctor, no wheezing, no cough    Follow-up lung nodule, smoking and dyspnea she says that she is quit smoking And she continues to be in remission.  I have not seen her since May 2018.  In the interim she saw a cardiologist and apparently her medication was adjusted and her dyspnea significantly improved and she feels it is resolved..  Therefore she does not feel she has a pulmonary issue.  However CT scan  of the chest shows emphysema without any evidence of pulmonary fibrosis cancer or pneumonia.  The nodule itself is stable.  She did not go through and follow through with a pulmonary function testing.  She is reluctantly okay to have Spiriva inhaler to see if her effort tolerance will be even better.  However she only wants once a year follow-up.  [name], [name] (MRN [id number]) as of 10/17/2016 11:50  Ref. Range 08/17/2014 10:29 09/17/2014 17:20 05/08/2015 14:41 05/09/2015 05:55 07/25/2015 14:39  Creatinine Latest Ref Range: 0.44 - 1.00 mg/dL 0.95 0.90 0.62  1.19 (H)   [name], [name] (MRN [id number]) as of 10/17/2016 11:50  Ref. Range 08/17/2014 10:29 09/17/2014 17:20 05/08/2015 14:41 05/09/2015 05:55 07/25/2015 14:39  Hemoglobin Latest Ref Range:  12.0 - 15.0 g/dL  12.6 12.1  10.8 (L)     has a past medical history of Anxiety, Cervical disc disease, Complication of anesthesia, Coronary artery disease, Depression, DJD (degenerative joint disease), Dysrhythmia, Fibromyalgia, H/O: liver disease (18 YRS AGO), Heart attack ([place]) (FEW YRS AGO), colonic polyps, Hyperlipidemia, Multiple sclerosis ([place]), Narcotic dependence ([place]), Osteoporosis, Positive H. pylori test, and Urinary incontinence.   reports that she quit smoking about 7 years ago. Her smoking use included cigarettes. She has a 48.00 pack-year smoking history. she has never used smokeless tobacco.  Past Surgical History:  Procedure Laterality Date  . BACK SURGERY  11-2009   LOWER BACK  . BOTOX INJECTION N/A 03/21/2016   Procedure: BOTOX INJECTION;  Surgeon: Wilford Corner, MD;  Location: [facility];  Service: Endoscopy;  Laterality: N/A;  . COLONSCOPY    . CORONARY STENT PLACEMENT     UNSUCCESSFUL STENT PLACEMENT  . ESOPHAGEAL MANOMETRY N/A 10/23/2015   Procedure: ESOPHAGEAL MANOMETRY (EM);  Surgeon: Wilford Corner, MD;  Location: [facility];  Service: Endoscopy;  Laterality: N/A;  . ESOPHAGOGASTRODUODENOSCOPY (EGD) WITH PROPOFOL N/A 03/21/2016   Procedure: ESOPHAGOGASTRODUODENOSCOPY (EGD) WITH PROPOFOL;  Surgeon: Wilford Corner, MD;  Location: [facility];  Service: Endoscopy;  Laterality: N/A;  . TUBAL LIGATION      Allergies  Allergen Reactions  . Crestor [Rosuvastatin] Hives and Other (See Comments)    welps  . Amoxicillin Hives    Has patient had a PCN reaction causing immediate rash, facial/tongue/throat swelling, SOB or lightheadedness with hypotension: Yes Has patient had a PCN reaction causing severe rash involving mucus membranes or skin necrosis: No Has patient had a PCN reaction that required hospitalization Yes Has patient had a PCN reaction occurring within the last 10 years: No If all of the above answers are "NO", then may proceed with Cephalosporin  use.   . Contrast Media [Iodinated Diagnostic Agents]     Severe rash  . Risedronate Sodium Other (See Comments)    Made her hair fall out.  . Sulfonamide Derivatives Hives    As a child    Immunization History  Administered Date(s) Administered  . Influenza Split 02/20/2011, 02/20/2012, 03/03/2014  . Influenza Whole 02/17/2017  . Influenza,inj,Quad PF,6+ Mos 02/19/2013  . Pneumococcal-Unspecified 03/03/2014  . Td 06/03/2002    Family History  Problem Relation Age of Onset  . Heart disease Mother 59  . Heart disease Father 18  . Cancer Brother      Current Outpatient Medications:  .  Ascorbic Acid (VITAMIN C) 1000 MG tablet, Take 1,000 mg by mouth daily., Disp: , Rfl:  .  aspirin 325 MG tablet, Take 325 mg by mouth daily., Disp: , Rfl:  .  Cholecalciferol (  VITAMIN D3) 3000 UNITS TABS, Take 3,000 Units by mouth daily., Disp: , Rfl:  .  clonazePAM (KLONOPIN) 1 MG tablet, TAKE 1 TABLET BY MOUTH 3 TIMES DAILY, Disp: 90 tablet, Rfl: 0 .  co-enzyme Q-10 30 MG capsule, Take 30 mg by mouth 3 (three) times daily., Disp: , Rfl:  .  doxepin (SINEQUAN) 25 MG capsule, Take 1 capsule (25 mg total) by mouth at bedtime., Disp: 30 capsule, Rfl: 5 .  gabapentin (NEURONTIN) 300 MG capsule, TAKE 1 CAPSULE BY MOUTH ONCE DAILY, Disp: 90 capsule, Rfl: 1 .  GLATOPA 20 MG/ML SOSY injection, USE 1 INJECTION SUBCUTANEOUSLY ONCE A DAY, Disp: 30 mL, Rfl: 11 .  LINZESS 290 MCG CAPS capsule, Take 1 capsule by mouth as needed. , Disp: , Rfl:  .  losartan (COZAAR) 25 MG tablet, Take 25 mg by mouth daily., Disp: , Rfl:  .  lovastatin (MEVACOR) 40 MG tablet, Take 1 tablet by mouth daily., Disp: , Rfl:  .  metoprolol succinate (TOPROL-XL) 50 MG 24 hr tablet, Take 50 mg by mouth daily., Disp: , Rfl:  .  modafinil (PROVIGIL) 200 MG tablet, TAKE 1 TABLET BY MOUTH TWICE A DAY, Disp: 60 tablet, Rfl: 3 .  Multiple Vitamin (MULTIVITAMIN WITH MINERALS) TABS tablet, Take 1 tablet by mouth daily., Disp: , Rfl:  .   NUCYNTA ER 50 MG TB12, Take 1 tablet by mouth every 12 (twelve) hours., Disp: , Rfl:  .  oxyCODONE-acetaminophen (PERCOCET) 5-325 MG per tablet, Take 1 tablet by mouth every 4 (four) hours. , Disp: , Rfl:  .  polyethylene glycol powder (GLYCOLAX/MIRALAX) powder, Take 17 g by mouth daily as needed. constipation, Disp: , Rfl:  .  PRISTIQ 50 MG 24 hr tablet, Take 50 mg by mouth at bedtime. , Disp: , Rfl:  .  tiZANidine (ZANAFLEX) 4 MG tablet, Take 2-4 mg by mouth 3 (three) times daily as needed. Reported on 08/30/2015, Disp: , Rfl:  .  albuterol (PROAIR HFA) 108 (90 BASE) MCG/ACT inhaler, Inhale 2 puffs into the lungs every 6 (six) hours as needed for wheezing or shortness of breath. , Disp: , Rfl:  .  diphenhydrAMINE (BENADRYL) 25 mg capsule, Take 2 capsules (50 mg total) by mouth once. One hour prior to injection of contrast, Disp: 2 capsule, Rfl: 0 .  guaiFENesin (MUCINEX) 600 MG 12 hr tablet, Take by mouth as needed., Disp: , Rfl:  .  oxymetazoline (AFRIN) 0.05 % nasal spray, Place 1 spray into both nostrils 2 (two) times daily as needed for congestion. congestion, Disp: , Rfl:    Review of Systems     Objective:   Physical Exam  Constitutional: She is oriented to person, place, and time. She appears well-developed and well-nourished. No distress.  HENT:  Head: Normocephalic and atraumatic.  Right Ear: External ear normal.  Left Ear: External ear normal.  Mouth/Throat: Oropharynx is clear and moist. No oropharyngeal exudate.  Eyes: Conjunctivae and EOM are normal. Pupils are equal, round, and reactive to light. Right eye exhibits no discharge. Left eye exhibits no discharge. No scleral icterus.  Neck: Normal range of motion. Neck supple. No JVD present. No tracheal deviation present. No thyromegaly present.  Cardiovascular: Normal rate, regular rhythm, normal heart sounds and intact distal pulses. Exam reveals no gallop and no friction rub.  No murmur heard. Pulmonary/Chest: Effort normal  and breath sounds normal. No respiratory distress. She has no wheezes. She has no rales. She exhibits no tenderness.  Abdominal: Soft. Bowel sounds  are normal. She exhibits no distension and no mass. There is no tenderness. There is no rebound and no guarding.  Musculoskeletal: Normal range of motion. She exhibits no edema or tenderness.  Lymphadenopathy:    She has no cervical adenopathy.  Neurological: She is alert and oriented to person, place, and time. She has normal reflexes. No cranial nerve deficit. She exhibits normal muscle tone. Coordination normal.  Skin: Skin is warm and dry. No rash noted. She is not diaphoretic. No erythema. No pallor.  Psychiatric: She has a normal mood and affect. Her behavior is normal. Judgment and thought content normal.  Vitals reviewed.  Vitals:   08/08/17 1215  BP: 126/74  Pulse: 74  SpO2: 98%  Weight: 119 lb 12.8 oz (54.3 kg)  Height: 5\' 1"  (1.549 m)    Estimated body mass index is 22.64 kg/m as calculated from the following:   Height as of this encounter: 5\' 1"  (1.549 m).   Weight as of this encounter: 119 lb 12.8 oz (54.3 kg).      Assessment:       ICD-10-CM   1. Pulmonary emphysema, unspecified emphysema type ([place]) J43.9        Plan:       You have emphysema Lung nodule is stable 2014 -> 2018 without need for followup  plan Please start spiriva 1 puff daily -  Followup 1 year or sooner if needed   Dr. Brand Males, M.D., [facility].C.P Pulmonary and Critical Care Medicine Staff Physician, [place] Director - Interstitial Lung Disease  Program  [facility] [place] [place], [place], [address]  Pager: [phone], If no answer or between  15:00h - 7:00h: call [phone] Telephone: [phone]

## 2017-08-08 NOTE — Patient Instructions (Addendum)
ICD-10-CM   1. Pulmonary emphysema, unspecified emphysema type ([place]) J43.9     You have emphysema Lung nodule is stable 2014 -> 2018 without need for followup  plan Please start spiriva 1 puff daily -  Followup 1 year or sooner if needed

## 2017-08-11 ENCOUNTER — Ambulatory Visit: Payer: Medicare Other | Admitting: Neurology

## 2017-08-11 ENCOUNTER — Encounter: Payer: Self-pay | Admitting: Neurology

## 2017-08-11 VITALS — BP 130/68 | HR 96 | Resp 16 | Ht 61.0 in | Wt 118.0 lb

## 2017-08-11 DIAGNOSIS — G35 Multiple sclerosis: Secondary | ICD-10-CM

## 2017-08-11 NOTE — Progress Notes (Signed)
NEUROLOGY FOLLOW UP OFFICE NOTE  [name] [id number]  HISTORY OF PRESENT ILLNESS: [name] is a 72 year old right-handed woman with CAD, thyroid disease, IBS, scar tissue of left lung, arthritis, fibromyalgia, depression, MS and history of BPPV who follows up for MS.    UPDATE: Current medications: Copaxone 20mg  [place] daily Gabapentin 100mg  at bedtime (300mg  causes drowsiness) Clonazepam 1mg  during day and 2mg  at bedtime for spasms D3 4000 IU daily Provigil 200mg   Vision:  She has permanent vision loss in the left eye. Motor:  Stable.  She also has intermittent head shaking. Sensory:  No Pain:  pain in arms and legs.  Lumbar radiculopathy. Gait:  She sometimes requires ambulation with a cane or walker.  On occasion, she has fallen. Bowel/bladder:  Stable Fatigue:  Yes.   Cognition:  .  She also reports increased short-term memory problems.  She needs to write down appointments on the calendar.  On one occasion, she had to think about who to travel on a familiar route. Mood:  Depressed mood.  Recently diagnosed with emphysema.  MRI of brain with and without contrast from 03/04/17 was personally reviewed and is unchanged from prior study on 05/08/15.   HISTORY: First symptom occurred in 1995, when she developed vision loss in the left eye, as well as left eye pain.  MRI of the brain with and without contrast was performed on 03/28/95 revealed 10-12 periventricular white matter lesions and one lesion in the corpus callosum, suspicious for demyelination.  There was also enhancement of the left optic nerve.  She was referred to Dr. Delphia Grates, an [place] specialist.  Lumbar puncture was performed, revealing no oligoclonal bands or myelin basic protein.  She has not required hospitalization or steroids since the late 1990s.  She was on Betaseron and later Copaxone.  Copaxone was discontinued due to longstanding stability, but due to fatigue and cognitive issues, it was restarted in  2014.  Her continued symptoms are episodes of worsening fatigue, as well as cognitive issues.  Sometimes, she has difficulty thinking clearly.  She will get disoriented while driving.  Other times, she has word-finding difficulties.  She once overpaid a bill.  She still performs all ADLs, including paying the bills.  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  Sometimes, when she feels symptoms are worse, she sees a white "half a doughnut" shape in her visual field.     Past disease-modifying agents:  Betaseron (elevated LFTs and necrotic injection sites)   For fatigue, she takes Provigil.  Adderal was ineffective.  Amantadine was ineffective. For pain in the arms and legs, she takes gabapentin 300mg  at bedtime.  She reportedly has lumbar radiculopathy which also contributes to the leg pain. Initially, she reportedly had significant spasticity.  She was on oral baclofen which did not help.  At one point, a baclofen pump was entertained.  She currently takes clonazepam 1mg  during the day and 2mg  at bedtime, which is effective.   Other imaging: 05/07/95 MRI LUMBAR WO:  mild disc bulging at L4-5 without disc herniation or central canal stenosis. 03/09/96 MRI BRAIN W/WO (comparing to MRI from 03/28/95):  slight increase in number of white matter lesions adjacent to the atrium of the right lateral ventricle, otherwise unchanged. 03/28/98 MRI BRAIN W/WO (comparing to MRI from 03/25/97):  several subtle areas of increased signal in the deep white matter, not as prevalent as on prior study.  No abnormal enhancement. 08/11/13  MRI of brain with and without contrast :  multiple supratentorial and infratentorial T2 hyperintensities, but no post-contrast enhancement.  However, this was not compared to prior imaging. 01/28/14 MRI Cervical spine without contrast:  spinal cord unremarkable.[name] MRI of brain with and without contrast from 08/17/14 is stable MRI of brain without contrast from  05/08/15 is stable.  PAST MEDICAL HISTORY: Past Medical History:  Diagnosis Date  . Anxiety   . Cervical disc disease    BULGING  . Complication of anesthesia    SLOW TO AWAKEN AFTER LAST COLONSCOPY  . Coronary artery disease   . Depression   . DJD (degenerative joint disease)    OA  . Dysrhythmia   . Emphysema of lung ([place])   . Fibromyalgia   . H/O: liver disease 50 YRS AGO   previous interferon induced  . Heart attack ([place]) FEW YRS AGO  . Hx of colonic polyps   . Hyperlipidemia   . Multiple sclerosis ([place])   . Narcotic dependence ([facility])    hx of benzodiazepine and narcotic  . Osteoporosis   . Positive H. pylori test   . Urinary incontinence     MEDICATIONS: Current Outpatient Medications on File Prior to Visit  Medication Sig Dispense Refill  . albuterol (PROAIR HFA) 108 (90 BASE) MCG/ACT inhaler Inhale 2 puffs into the lungs every 6 (six) hours as needed for wheezing or shortness of breath.     . Ascorbic Acid (VITAMIN C) 1000 MG tablet Take 1,000 mg by mouth daily.    [name] aspirin 325 MG tablet Take 325 mg by mouth daily.    . Cholecalciferol (VITAMIN D3) 3000 UNITS TABS Take 3,000 Units by mouth daily.    . clonazePAM (KLONOPIN) 1 MG tablet TAKE 1 TABLET BY MOUTH 3 TIMES DAILY 90 tablet 0  . co-enzyme Q-10 30 MG capsule Take 30 mg by mouth 3 (three) times daily.    [name] doxepin (SINEQUAN) 25 MG capsule Take 1 capsule (25 mg total) by mouth at bedtime. 30 capsule 5  . fluticasone-salmeterol (ADVAIR HFA) 115-21 MCG/ACT inhaler Inhale 2 puffs into the lungs 2 (two) times daily.    [name] gabapentin (NEURONTIN) 300 MG capsule TAKE 1 CAPSULE BY MOUTH ONCE DAILY 90 capsule 1  . GLATOPA 20 MG/ML SOSY injection USE 1 INJECTION SUBCUTANEOUSLY ONCE A DAY 30 mL 11  . guaiFENesin (MUCINEX) 600 MG 12 hr tablet Take by mouth as needed.    . isosorbide mononitrate (IMDUR) 30 MG 24 hr tablet     . LINZESS 290 MCG CAPS capsule Take 1 capsule by mouth as needed.     [name] losartan (COZAAR) 25 MG  tablet Take 25 mg by mouth daily.    [name] lovastatin (MEVACOR) 40 MG tablet Take 1 tablet by mouth daily.    . metoprolol succinate (TOPROL-XL) 50 MG 24 hr tablet Take 50 mg by mouth daily.    . modafinil (PROVIGIL) 200 MG tablet TAKE 1 TABLET BY MOUTH TWICE A DAY 60 tablet 3  . Multiple Vitamin (MULTIVITAMIN WITH MINERALS) TABS tablet Take 1 tablet by mouth daily.    Gean Birchwood ER 50 MG TB12 Take 1 tablet by mouth every 12 (twelve) hours.    [name] oxyCODONE-acetaminophen (PERCOCET) 5-325 MG per tablet Take 1 tablet by mouth every 4 (four) hours.     [name] oxymetazoline (AFRIN) 0.05 % nasal spray Place 1 spray into both nostrils 2 (two) times daily as needed for congestion. congestion    . polyethylene glycol  powder (GLYCOLAX/MIRALAX) powder Take 17 g by mouth daily as needed. constipation    . PRISTIQ 50 MG 24 hr tablet Take 50 mg by mouth at bedtime.     [name] tiotropium (SPIRIVA) 18 MCG inhalation capsule Place 1 capsule (18 mcg total) into inhaler and inhale daily. 30 capsule 6  . tiZANidine (ZANAFLEX) 4 MG tablet Take 2-4 mg by mouth 3 (three) times daily as needed. Reported on 08/30/2015     No current facility-administered medications on file prior to visit.     ALLERGIES: Allergies  Allergen Reactions  . Crestor [Rosuvastatin] Hives and Other (See Comments)    welps  . Amoxicillin Hives    Has patient had a PCN reaction causing immediate rash, facial/tongue/throat swelling, SOB or lightheadedness with hypotension: Yes Has patient had a PCN reaction causing severe rash involving mucus membranes or skin necrosis: No Has patient had a PCN reaction that required hospitalization Yes Has patient had a PCN reaction occurring within the last 10 years: No If all of the above answers are "NO", then may proceed with Cephalosporin use.   . Contrast Media [Iodinated Diagnostic Agents]     Severe rash  . Risedronate Sodium Other (See Comments)    Made her hair fall out.  . Sulfonamide Derivatives Hives     As a child    FAMILY HISTORY: Family History  Problem Relation Age of Onset  . Heart disease Mother 24  . Heart disease Father 73  . Cancer Brother     SOCIAL HISTORY: Social History   Socioeconomic History  . Marital status: Divorced    Spouse name: Not on file  . Number of children: 2  . Years of education: 12th  . Highest education level: Not on file  Social Needs  . Financial resource strain: Not on file  . Food insecurity - worry: Not on file  . Food insecurity - inability: Not on file  . Transportation needs - medical: Not on file  . Transportation needs - non-medical: Not on file  Occupational History  . Occupation: disability    Employer: RETIRED  Tobacco Use  . Smoking status: Former Smoker    Packs/day: 1.00    Years: 48.00    Pack years: 48.00    Types: Cigarettes    Last attempt to quit: 11/16/2009    Years since quitting: 7.7  . Smokeless tobacco: Never Used  Substance and Sexual Activity  . Alcohol use: No  . Drug use: No  . Sexual activity: Not on file  Other Topics Concern  . Not on file  Social History Narrative   Patient lives at home alone.   Caffeine Use: rarely    REVIEW OF SYSTEMS: Constitutional: fatigue Eyes: No visual changes, double vision, eye pain Ear, nose and throat: No hearing loss, ear pain, nasal congestion, sore throat Cardiovascular: No chest pain, palpitations Respiratory:  Dyspnea on exertion GastrointestinaI: No nausea, vomiting, diarrhea, abdominal pain, fecal incontinence Genitourinary:  No dysuria, urinary retention or frequency Musculoskeletal:  No neck pain, back pain Integumentary: No rash, pruritus, skin lesions Neurological: as above Psychiatric: depressed Endocrine: fatigue Hematologic/Lymphatic:  No purpura, petechiae. Allergic/Immunologic: no itchy/runny eyes, nasal congestion, recent allergic reactions, rashes  PHYSICAL EXAM: Vitals:   08/11/17 1031  BP: 130/68  Pulse: 96  Resp: 16  SpO2: 96%    General: No acute distress.  Patient appears well-groomed.   Head:  Normocephalic/atraumatic Eyes:  Fundi examined but not visualized Neck: supple, no paraspinal tenderness, full range  of motion Heart:  Regular rate and rhythm Lungs:  Clear to auscultation bilaterally Back: No paraspinal tenderness Neurological Exam: alert and oriented to person, place, and time, recent and remote memory intact, fund of knowledge intact, attention and concentration intact, speech fluent and not dysarthric, language intact.  Left monocular  vision loss.  Otherwise, CN II-XII intact.  Bulk and tone normal.  Muscle strength 5/5 throughout. Sensation to light touch intact.  Finger to nose without dysmetria.  Unsteady gait with left limp (due to "pinched nerve").   Romberg with sway.  IMPRESSION: Multiple sclerosis  PLAN: Continue Copaxone, gabapentin and vitamin D Follow up in 6 months.  27 minutes spent face to face with patient, over 50% spent discussing management.  Metta Clines, DO  CC:  Donald Prose, MD

## 2017-08-11 NOTE — Patient Instructions (Signed)
Follow up in 6 months 

## 2017-08-14 ENCOUNTER — Telehealth: Payer: Self-pay | Admitting: Internal Medicine

## 2017-08-14 MED ORDER — UMECLIDINIUM BROMIDE 62.5 MCG/INH IN AEPB: 1.0000 | INHALATION_SPRAY | Freq: Every day | RESPIRATORY_TRACT | 0 refills | Status: DC

## 2017-08-14 NOTE — Telephone Encounter (Signed)
Called and spoke to pt.  Pt feels that Spiriva capsule is causing her to be anxious and shaky.  Pt would like to know if there is a lower dose or alternative. Pt states she started Spiriva on Friday and symptoms occurred after first dose.   MR please advise. Thanks.

## 2017-08-14 NOTE — Telephone Encounter (Signed)
Try incruse dail;y  (dc spiriva for now). She should come in and get a sample first and see how it goes  Dr. Brand Males, M.D., [facility].C.P Pulmonary and Critical Care Medicine Staff Physician, [place] Director - Interstitial Lung Disease  Program  Pulmonary [place] at [place], [place], [address]  Pager: [phone], If no answer or between  15:00h - 7:00h: call [phone] Telephone: [phone]

## 2017-08-14 NOTE — Telephone Encounter (Signed)
Pt is aware of below message and voiced her understanding. We do not currently have samples of Incruse.  I have mailed a coupon for one month free of incruse to verified address per pt's request. Nothing further is needed.

## 2017-08-19 ENCOUNTER — Telehealth: Payer: Self-pay | Admitting: Internal Medicine

## 2017-08-19 NOTE — Telephone Encounter (Signed)
Called and spoke with pt clarifying that she was needing a sample of Incruse.  Pt expressed understanding and stated to me she was told she would be receiving a coupon in the mail to take to her pharmacy to be able to receive a free inhaler.  I stated to pt that we have samples of inhalers at our office but currently we did not have incruse and she could periodically call our office to see if we have received samples of it and then we could put one aside for her to come pick up.  After the conversation about the inhaler, pt stated to me ever since she had a ct with contrast, her body has been damaged due to the contrast that was in the CT scan our office ordered.  I stated to pt the ct chest with contrast she had done 05/17/10 was ordered by Dr. Sherley Bounds not by Dr. Chase Caller.  I stated to pt the CT scans that have been ordered by Dr. Chase Caller have been without contrast.  Pt then stated to me she was reading through notes where it stated on there she had emphysema and pt stated to me she was never told she had emphysema.  I then stated to pt that in her office visits she has had with Dr. Chase Caller that pt had emphysema.  Pt's first visit with Dr. Chase Caller 09/21/10 stated pt possibly was undiagnosed with copd. Pt's visit with Dr. Chase Caller 02/20/12 stated from the CT scan pt had that it did show mild emphysema.  Pt still stated to me that she had never been told she had emphysema by anybody even though there is documentation regarding it.  She stated to me there can be documentation regarding it but no matter what she was never told she had it.  I stated to pt in her after summary visit paperwork we have given her that discusses her visit states on there that she had emphysema.  Pt stated to me she did not have any idea what that paper was for that she was given after her visit with Dr. Chase Caller when the after visit summary is given to her with instructions for her to do based on her visit.  I  stated to pt the paperwork is gone over with her after each visit.  Pt stated to me nobody has ever gone over the paperwork with her at the end of her visits so she had no idea she was even supposed to review the paperwork after the visit.  I stated to pt we go over the paperwork with all patients so they will know instructions of what they are supposed to do after each visit and she again said to me very angrily nobody has ever gone over the paperwork with her.  Pt again brought up that she was told someone stated they were going to mail a coupon to her address to receive a free month of incruse.  I did find the phone note from 08/14/17 where Margie spoke with the pt and stated she did mail a coupon to pt's address we have on file.  Pt stated she never received anything in the mail.  Based on this conversation where Lesleigh Noe had mailed a coupon to her address, I stated to pt we could place a coupon up front for her to come pick up since the one Margie mailed to her in the mail has not come.  Pt angrily stated to me she was too tired and because  of a lot that has been going on with her health she will not be coming by to pick up the coupon and demanded it be placed in the mail again.  I verified the address we have on file for pt making sure it was the correct address, and per pt that address was correct.  Pt stated to me she was going to be waiting outside for her mail person to ask them why the coupon has not come yet due to it being sent in the mail 08/14/17.  I stated to pt I would place another coupon in the mail for her but due to the time it would not go out until tomorrow, 08/20/17.  Pt stated to me she has a visit coming up with [place] on 08/25/17 that she cannot wait for the visit with Dr. Chase Caller based on all her diagnoses she stated she was never told.    Will place another coupon in mail for pt. Nothing further needed at this current time.

## 2017-08-25 ENCOUNTER — Encounter: Payer: Self-pay | Admitting: Internal Medicine

## 2017-08-25 ENCOUNTER — Ambulatory Visit: Payer: Medicare Other | Admitting: Internal Medicine

## 2017-08-25 ENCOUNTER — Ambulatory Visit: Payer: Self-pay | Admitting: Internal Medicine

## 2017-08-25 VITALS — BP 116/74 | HR 82

## 2017-08-25 DIAGNOSIS — Z87891 Personal history of nicotine dependence: Secondary | ICD-10-CM

## 2017-08-25 DIAGNOSIS — J439 Emphysema, unspecified: Secondary | ICD-10-CM | POA: Diagnosis not present

## 2017-08-25 DIAGNOSIS — R0789 Other chest pain: Secondary | ICD-10-CM

## 2017-08-25 DIAGNOSIS — R079 Chest pain, unspecified: Secondary | ICD-10-CM

## 2017-08-25 MED ORDER — IPRATROPIUM BROMIDE 0.02 % IN SOLN: 0.5000 mg | Freq: Four times a day (QID) | RESPIRATORY_TRACT | 12 refills | Status: DC

## 2017-08-25 NOTE — Patient Instructions (Signed)
ICD-10-CM   1. Pulmonary emphysema, unspecified emphysema type ([place]) J43.9   2. History of smoking 25-50 pack years Z87.891   3. Chest pain of uncertain etiology T90.30     uncclear and unlikely pain is from emphysema  Plan Support your decision to seek cardiology input Add spiriva to allergy list Try atrovent nebulizer twice daily as discussed with you and your daughter   - might or might not help burning pain, only if you try we will know Do PFT Pre-bd spiro and dlco only. No lung volume or bd response. No post-bd spiro in 3 months  - you have cancelled on breathing test in past  Followup 3 months with APP

## 2017-08-25 NOTE — Progress Notes (Signed)
Subjective:     Patient ID: [name], female   DOB: 1946-03-22, 72 y.o.   MRN: [id number]  HPI    OV 06/13/2014   Chief Complaint  Patient presents with  . Follow-up    Pt here for yearly f/u. Pt did not have CT d/t insurance issues. Pt c/o DOE. Pt denies cough and CP/tightness.     Followup  for below issues. Last seen setp 2014  a) LUL Nodule - apical, posterior segment, subpleural first noted dec 2011.  1.3cm. Unchanged as of Sept 2013. No followup CT done - she never showedup. sAys cost was an issue but now wants testing  B) Smoking: has consistently refused to quit as of sept 2014. Butu now is in on e-cig which she says she uses occassionally  C) chronic class 2 exertional dyspnea for which she has refused workup (known to have CAD with EF 45% on cath 2012). EMphysema on CT.  Spirometry today is normal - fev1 1.68L/83%. She continues to be dyspneic; it is not worse or better. Only uses alb prn. I approached stepping up mdi Rx but she quickly denied she is a copd/emphysema patient. Lilkewise, she refused alpha 1 testing.    OV 10/17/2016  Chief Complaint  Patient presents with  . Acute Visit    last ov 06/2014 - c/o burning in her chest x1 year.  has been following up with Dr Einar Gip    Follow-up  Left upper lobe nodule-she has a history of lung nodule in 2011 left upper lobe 1.3 cm. This was unchanged in September 2013. In 2016 she never followed up for CT chest. She is here today and is agreeable to have CT chest.  Smoking: Greater than 30 pack smoking history. She states she is now quit for more than few years  Dyspnea: She is known to have coronary artery disease with ejection fraction 45% on 2012. Review of the chart shows in 2017 she had an echo with improved ejection fraction. She did have normal spirometry in 2016. But other times she refused to have hip. In the interim she says for the last year or so she's had new onset exertional chest pain particularly when  making the bed. Sometimes her walking city block but sometimes the pain is not there. But it does seem that the pain is always exertional. She saw her cardiologist Dr. Einar Gip earlier this week and according to her history he has had this is very likely from coronary artery disease but she does not believe it. Therefore she is here today to figure out if her chest pain is related to lung disease. She does have exertional dyspnea but this is much milder or sometimes even absent. The chest pain is the more dominant feature now. Walking desat test -> 185 feet x 3 laps on RA - no chest pain or burning. Pulse ox 99%, HR went from 80s to 110s   OV 08/08/2017  Chief Complaint  Patient presents with  . Follow-up    SOB improving after seeing heart doctor, no wheezing, no cough    Follow-up lung nodule, smoking and dyspnea she says that she is quit smoking And she continues to be in remission.  I have not seen her since May 2018.  In the interim she saw a cardiologist and apparently her medication was adjusted and her dyspnea significantly improved and she feels it is resolved..  Therefore she does not feel she has a pulmonary issue.  However CT scan  of the chest shows emphysema without any evidence of pulmonary fibrosis cancer or pneumonia.  The nodule itself is stable.  She did not go through and follow through with a pulmonary function testing.  She is reluctantly okay to have Spiriva inhaler to see if her effort tolerance will be even better.  However she only wants once a year follow-up.    OV 08/25/2017  Chief Complaint  Patient presents with  . Follow-up    Pt has had a lot of frustration due to previous visit with cardiologist. Pt has had pain in both arms and increased burning midsternal area of chest when trying to walk on treadmill.     Fu lung nodule _ she has a history of lung nodule in 2011 left upper lobe 1.3 cm. This was unchanged in September 2013 and Sept 2014 and September 2018.  Radiologist have called this benign on both Sept 2014 and Sept 2018 CT. Last visit earlier this month. Benign diagnosi   Fu emphysema (prior smoking hx + CT with emphysema but normal spirometry in 2012)  . She has not followed through in past for repeat PFT. Last visit she reluctantly agreed to try spiriva to see if this would improve her effor tolerance but she called on 08/14/17 saying it made her anxious and shaky. We offered a sample of incruse (but sample not available and so discount card mailed out). She called back 08/19/17 and spoke to [place] saying discount card never arrived (but had many other complaints including claiming she was never told she had emphysema which we refuted) .She continues to have the burning chest pain.  At previous visit she is told me this was exertional pain but today she tells me this is after injections with her multiple sclerosis drug.  She is convinced that the chest pains/burning is coming from a cardiac source.  She tells me that she has had multiple catheterizations and nothing has been done.  I asked if this is because nothing was there to be done or nothing could be done and she could not answer.  Nevertheless she is looking at switching her cardiologist to Dr. Saunders Revel.  She is here with her daughter.  Daughter wants me to try a nebulizer.  They both or if clear understanding that the effort to try and nebulizers to see if this would resolve the burning pain in the rare event this could be from emphysema.  Patient also apologized for her behavior on the phone.  She says that at the time of last visit she was overwhelmed did not really understand that she had emphysema until she went home and read the paper.  She is willing to go through pulmonary function testing which she has deferred in the past.        CAT COPD Symptom & Quality of Life Score ([place] trademark) 0 is no burden. 5 is highest burden 08/25/2017   Never Cough -> Cough all the time 0  No phlegm in chest ->  Chest is full of phlegm 0  No chest tightness -> Chest feels very tight 2  No dyspnea for 1 flight stairs/hill -> Very dyspneic for 1 flight of stairs 2  No limitations for ADL at home -> Very limited with ADL at home 5  Confident leaving home -> Not at all confident leaving home 0  Sleep soundly -> Do not sleep soundly because of lung condition 0  Lots of Energy -> No energy at all 4  TOTAL Score (  max 40)  13      Lungs/Pleura: Mild biapical pleuroparenchymal scarring. Mild centrilobular and paraseptal emphysema. Scattered pulmonary parenchymal scarring. Subpleural nodular consolidation in the posteromedial left upper lobe measures approximately 7 x 16 mm, unchanged when remeasured on the prior study and therefore considered benign. No pleural fluid. Airway is unremarkable.  IMPRESSION: 1. No findings to explain the patient's given symptoms. 2. Aortic atherosclerosis (ICD10-170.0). Coronary artery calcification. 3.  Emphysema (ICD10-J43.9).   Electronically Signed   By: Lorin Picket M.D.   On: 02/10/2017 13:29      has a past medical history of Anxiety, Cervical disc disease, Complication of anesthesia, Coronary artery disease, Depression, DJD (degenerative joint disease), Dysrhythmia, Emphysema of lung ([place]), Fibromyalgia, H/O: liver disease (18 YRS AGO), Heart attack ([place]) (FEW YRS AGO), colonic polyps, Hyperlipidemia, Multiple sclerosis ([place]), Narcotic dependence ([place]), Osteoporosis, Positive H. pylori test, and Urinary incontinence.   reports that she quit smoking about 7 years ago. Her smoking use included cigarettes. She has a 48.00 pack-year smoking history. She has never used smokeless tobacco.  Past Surgical History:  Procedure Laterality Date  . BACK SURGERY  11-2009   LOWER BACK  . BOTOX INJECTION N/A 03/21/2016   Procedure: BOTOX INJECTION;  Surgeon: Wilford Corner, MD;  Location: [facility];  Service: Endoscopy;  Laterality: N/A;  . COLONSCOPY    .  CORONARY STENT PLACEMENT     UNSUCCESSFUL STENT PLACEMENT  . ESOPHAGEAL MANOMETRY N/A 10/23/2015   Procedure: ESOPHAGEAL MANOMETRY (EM);  Surgeon: Wilford Corner, MD;  Location: [facility];  Service: Endoscopy;  Laterality: N/A;  . ESOPHAGOGASTRODUODENOSCOPY (EGD) WITH PROPOFOL N/A 03/21/2016   Procedure: ESOPHAGOGASTRODUODENOSCOPY (EGD) WITH PROPOFOL;  Surgeon: Wilford Corner, MD;  Location: [facility];  Service: Endoscopy;  Laterality: N/A;  . TUBAL LIGATION      Allergies  Allergen Reactions  . Crestor [Rosuvastatin] Hives and Other (See Comments)    welps  . Amoxicillin Hives    Has patient had a PCN reaction causing immediate rash, facial/tongue/throat swelling, SOB or lightheadedness with hypotension: Yes Has patient had a PCN reaction causing severe rash involving mucus membranes or skin necrosis: No Has patient had a PCN reaction that required hospitalization Yes Has patient had a PCN reaction occurring within the last 10 years: No If all of the above answers are "NO", then may proceed with Cephalosporin use.   . Contrast Media [Iodinated Diagnostic Agents]     Severe rash  . Risedronate Sodium Other (See Comments)    Made her hair fall out.  . Sulfonamide Derivatives Hives    As a child    Immunization History  Administered Date(s) Administered  . Influenza Split 02/20/2011, 02/20/2012, 03/03/2014  . Influenza Whole 02/17/2017  . Influenza,inj,Quad PF,6+ Mos 02/19/2013  . Pneumococcal-Unspecified 03/03/2014  . Td 06/03/2002    Family History  Problem Relation Age of Onset  . Heart disease Mother 54  . Heart disease Father 10  . Cancer Brother      Current Outpatient Medications:  .  albuterol (PROAIR HFA) 108 (90 BASE) MCG/ACT inhaler, Inhale 2 puffs into the lungs every 6 (six) hours as needed for wheezing or shortness of breath. , Disp: , Rfl:  .  Ascorbic Acid (VITAMIN C) 1000 MG tablet, Take 1,000 mg by mouth daily., Disp: , Rfl:  .  aspirin 325 MG  tablet, Take 325 mg by mouth daily., Disp: , Rfl:  .  Cholecalciferol (VITAMIN D3) 3000 UNITS TABS, Take 3,000 Units by mouth daily., Disp: ,  Rfl:  .  clonazePAM (KLONOPIN) 1 MG tablet, TAKE 1 TABLET BY MOUTH 3 TIMES DAILY, Disp: 90 tablet, Rfl: 0 .  co-enzyme Q-10 30 MG capsule, Take 30 mg by mouth 3 (three) times daily., Disp: , Rfl:  .  doxepin (SINEQUAN) 25 MG capsule, Take 1 capsule (25 mg total) by mouth at bedtime., Disp: 30 capsule, Rfl: 5 .  fluticasone-salmeterol (ADVAIR HFA) 115-21 MCG/ACT inhaler, Inhale 2 puffs into the lungs 2 (two) times daily., Disp: , Rfl:  .  gabapentin (NEURONTIN) 300 MG capsule, TAKE 1 CAPSULE BY MOUTH ONCE DAILY, Disp: 90 capsule, Rfl: 1 .  GLATOPA 20 MG/ML SOSY injection, USE 1 INJECTION SUBCUTANEOUSLY ONCE A DAY, Disp: 30 mL, Rfl: 11 .  guaiFENesin (MUCINEX) 600 MG 12 hr tablet, Take by mouth as needed., Disp: , Rfl:  .  isosorbide mononitrate (IMDUR) 30 MG 24 hr tablet, , Disp: , Rfl:  .  LINZESS 290 MCG CAPS capsule, Take 1 capsule by mouth as needed. , Disp: , Rfl:  .  losartan (COZAAR) 25 MG tablet, Take 25 mg by mouth daily., Disp: , Rfl:  .  lovastatin (MEVACOR) 40 MG tablet, Take 1 tablet by mouth daily., Disp: , Rfl:  .  metoprolol succinate (TOPROL-XL) 50 MG 24 hr tablet, Take 50 mg by mouth daily., Disp: , Rfl:  .  modafinil (PROVIGIL) 200 MG tablet, TAKE 1 TABLET BY MOUTH TWICE A DAY, Disp: 60 tablet, Rfl: 3 .  Multiple Vitamin (MULTIVITAMIN WITH MINERALS) TABS tablet, Take 1 tablet by mouth daily., Disp: , Rfl:  .  NUCYNTA ER 50 MG TB12, Take 1 tablet by mouth every 12 (twelve) hours., Disp: , Rfl:  .  oxyCODONE-acetaminophen (PERCOCET) 5-325 MG per tablet, Take 1 tablet by mouth every 4 (four) hours. , Disp: , Rfl:  .  oxymetazoline (AFRIN) 0.05 % nasal spray, Place 1 spray into both nostrils 2 (two) times daily as needed for congestion. congestion, Disp: , Rfl:  .  polyethylene glycol powder (GLYCOLAX/MIRALAX) powder, Take 17 g by mouth daily  as needed. constipation, Disp: , Rfl:  .  PRISTIQ 50 MG 24 hr tablet, Take 50 mg by mouth at bedtime. , Disp: , Rfl:  .  tiZANidine (ZANAFLEX) 4 MG tablet, Take 2-4 mg by mouth 3 (three) times daily as needed. Reported on 08/30/2015, Disp: , Rfl:  .  umeclidinium bromide (INCRUSE ELLIPTA) 62.5 MCG/INH AEPB, Inhale 1 puff into the lungs daily., Disp: 60 each, Rfl: 0   Review of Systems     Objective:   Physical Exam  Constitutional: She is oriented to person, place, and time. She appears well-developed and well-nourished. No distress.  Thin female  HENT:  Head: Normocephalic and atraumatic.  Right Ear: External ear normal.  Left Ear: External ear normal.  Mouth/Throat: Oropharynx is clear and moist. No oropharyngeal exudate.  Eyes: Pupils are equal, round, and reactive to light. Conjunctivae and EOM are normal. Right eye exhibits no discharge. Left eye exhibits no discharge. No scleral icterus.  Neck: Normal range of motion. Neck supple. No JVD present. No tracheal deviation present. No thyromegaly present.  Cardiovascular: Normal rate, regular rhythm, normal heart sounds and intact distal pulses. Exam reveals no gallop and no friction rub.  No murmur heard. Pulmonary/Chest: Effort normal and breath sounds normal. No respiratory distress. She has no wheezes. She has no rales. She exhibits no tenderness.  Abdominal: Soft. Bowel sounds are normal. She exhibits no distension and no mass. There is no tenderness. There  is no rebound and no guarding.  Musculoskeletal: Normal range of motion. She exhibits no edema or tenderness.  Lymphadenopathy:    She has no cervical adenopathy.  Neurological: She is alert and oriented to person, place, and time. She has normal reflexes. No cranial nerve deficit. She exhibits normal muscle tone. Coordination normal.  Skin: Skin is warm and dry. No rash noted. She is not diaphoretic. No erythema. No pallor.  Psychiatric: She has a normal mood and affect. Her  behavior is normal. Judgment and thought content normal.  Vitals reviewed.  There were no vitals filed for this visit.  Estimated body mass index is 22.3 kg/m as calculated from the following:   Height as of 08/11/17: 5\' 1"  (1.549 m).   Weight as of 08/11/17: 118 lb (53.5 kg).     Assessment:       ICD-10-CM   1. Pulmonary emphysema, unspecified emphysema type ([place]) J43.9   2. History of smoking 25-50 pack years Z87.891   3. Chest pain of uncertain etiology J62.83        Plan:      Degree of emphysema based on CT, old PFT and CAT score is mild uncclear and unlikely pain is from emphysema  Plan Support your decision to seek cardiology input Add spiriva to allergy list Try atrovent nebulizer twice daily as discussed with you and your daughter   - might or might not help burning pain, only if you try we will know Do PFT Pre-bd spiro and dlco only. No lung volume or bd response. No post-bd spiro in 3 months  - you have cancelled on breathing test in past  - to restage the disease  Followup 3 months with APP  > 50% of this > 25 min visit spent in face to face counseling or coordination of care   Dr. Brand Males, M.D., [facility] Pulmonary and Critical Care Medicine Staff Physician, [place] Director - Interstitial Lung Disease  Program  P[facility] [place] [facility] [place], [place], [id number]  Pager: [phone], If no answer or between  15:00h - 7:00h: call [phone] Telephone: [phone]

## 2017-08-26 ENCOUNTER — Telehealth: Payer: Self-pay

## 2017-08-26 ENCOUNTER — Telehealth: Payer: Self-pay | Admitting: Internal Medicine

## 2017-08-26 NOTE — Telephone Encounter (Signed)
SENT REFERRAL TO SCHEDULING 

## 2017-08-26 NOTE — Telephone Encounter (Signed)
Spoke with patient. She stated that she has received her neb machine by [facility], but they did not deliver her medication. She was advised during her OV with MR that both the machine and medication would be delivered together. Patient stated that she asked the rep who dropped her machine about this and he told her that they ([facility]) do not deliver medications and that she needed to call our office back to send the RX to a pharmacy.   Spoke with Water quality scientist at [employer]. She stated that they have received the order and are waiting on MR to sign a "CVO" form. She stated that this was faxed over to [place] this morning. As soon as this has been filled out and faxed back to the number on the form, patient's medication will send via overnight mail.   Spoke with patient again. She verbalized understanding.   Rodena Piety, please advise if you have received a "CVO" form from [place] in regards to patient's ipratropium solution. Thanks!

## 2017-08-28 NOTE — Telephone Encounter (Signed)
[name], please advise if you have the form from [place] so pt can be able to receive her nebs, and if you do have the form, has it been signed by MR.  Please advise, thanks!

## 2017-08-28 NOTE — Telephone Encounter (Signed)
I have no idea where this CVO form is. I don't have it in my stuff. I have called the [facility] and spoke with [facility] and she will be having Mariann Laster to refax the form to my Attn:

## 2017-08-28 NOTE — Telephone Encounter (Signed)
We now have the form for MR to sign but will have to wait for him to return to office to sign it. I have given the form to [facility]

## 2017-08-29 NOTE — Telephone Encounter (Signed)
Called pt letting her know we have received the forms from [place] and will have MR sign it once he returns back to the office Tuesday, 4/2.  Stated to pt I would call her once forms have been faxed to the proper place.  Pt expressed understanding. Will route this message to myself so I can follow up on.

## 2017-08-29 NOTE — Telephone Encounter (Signed)
Pt is calling back [phone]

## 2017-09-02 NOTE — Telephone Encounter (Signed)
Per Raquel Sarna- forms will given to MR today for signature.

## 2017-09-03 NOTE — Telephone Encounter (Addendum)
Forms signed by MR and faxed to [facility] for pt.  Pt made aware this has been taken care of.   Nothing further needed at this time.

## 2017-09-04 ENCOUNTER — Ambulatory Visit: Payer: Self-pay | Admitting: Internal Medicine

## 2017-09-09 ENCOUNTER — Encounter (HOSPITAL_COMMUNITY): Payer: Self-pay

## 2017-09-09 ENCOUNTER — Ambulatory Visit (HOSPITAL_COMMUNITY): Admit: 2017-09-09 | Payer: Medicare Other | Admitting: Cardiology

## 2017-09-09 SURGERY — RIGHT/LEFT HEART CATH AND CORONARY ANGIOGRAPHY
Anesthesia: LOCAL

## 2017-09-23 ENCOUNTER — Telehealth: Payer: Self-pay | Admitting: Internal Medicine

## 2017-09-23 MED ORDER — DOXYCYCLINE HYCLATE 100 MG PO TABS: 100.0000 mg | ORAL_TABLET | Freq: Two times a day (BID) | ORAL | 0 refills | Status: DC

## 2017-09-23 NOTE — Telephone Encounter (Signed)
Attempted to call patient, no answer, unable to leave voicemail.  Sent in Rx per Dr. Chase Caller of Doxycycline to patient's pharmacy of choice [facility] on [address]. Nothing further needed at this time.

## 2017-09-23 NOTE — Telephone Encounter (Signed)
  Allergies  Allergen Reactions  . Crestor [Rosuvastatin] Hives and Other (See Comments)    welps  . Amoxicillin Hives    Has patient had a PCN reaction causing immediate rash, facial/tongue/throat swelling, SOB or lightheadedness with hypotension: Yes Has patient had a PCN reaction causing severe rash involving mucus membranes or skin necrosis: No Has patient had a PCN reaction that required hospitalization Yes Has patient had a PCN reaction occurring within the last 10 years: No If all of the above answers are "NO", then may proceed with Cephalosporin use.   . Contrast Media [Iodinated Diagnostic Agents]     Severe rash  . Sulfonamide Derivatives Hives    As a child    REcommend   Take doxycycline 100mg  po twice daily x 5 days; take after meals and avoid sunlight

## 2017-09-23 NOTE — Telephone Encounter (Signed)
Pt c/o sore "raw, scratchy" throat X2-3 days.  Denies fever, mucus production, chest pain, increased dyspnea.  Pt has been taking delsym which is helping, but is requesting additional recs.    Pt uses [facility] [address].   MR please advise on additional recs.  Thanks!

## 2017-09-29 ENCOUNTER — Encounter: Payer: Self-pay | Admitting: Physician Assistant

## 2017-09-29 ENCOUNTER — Encounter: Payer: Self-pay | Admitting: Neurology

## 2017-09-29 ENCOUNTER — Ambulatory Visit: Payer: Medicare Other | Admitting: Physician Assistant

## 2017-09-29 VITALS — BP 150/90 | HR 79 | Ht 61.0 in | Wt 121.0 lb

## 2017-09-29 DIAGNOSIS — I5042 Chronic combined systolic (congestive) and diastolic (congestive) heart failure: Secondary | ICD-10-CM

## 2017-09-29 DIAGNOSIS — I447 Left bundle-branch block, unspecified: Secondary | ICD-10-CM

## 2017-09-29 DIAGNOSIS — J449 Chronic obstructive pulmonary disease, unspecified: Secondary | ICD-10-CM | POA: Insufficient documentation

## 2017-09-29 DIAGNOSIS — I700 Atherosclerosis of aorta: Secondary | ICD-10-CM | POA: Diagnosis not present

## 2017-09-29 DIAGNOSIS — E785 Hyperlipidemia, unspecified: Secondary | ICD-10-CM

## 2017-09-29 DIAGNOSIS — J439 Emphysema, unspecified: Secondary | ICD-10-CM

## 2017-09-29 DIAGNOSIS — I25119 Atherosclerotic heart disease of native coronary artery with unspecified angina pectoris: Secondary | ICD-10-CM | POA: Diagnosis not present

## 2017-09-29 HISTORY — DX: Atherosclerosis of aorta: I70.0

## 2017-09-29 HISTORY — DX: Chronic combined systolic (congestive) and diastolic (congestive) heart failure: I50.42

## 2017-09-29 MED ORDER — PREDNISONE 50 MG PO TABS: ORAL_TABLET | ORAL | 0 refills | Status: DC

## 2017-09-29 MED ORDER — ISOSORBIDE MONONITRATE ER 60 MG PO TB24: 60.0000 mg | ORAL_TABLET | Freq: Every day | ORAL | 3 refills | Status: DC

## 2017-09-29 NOTE — H&P (View-Only) (Signed)
Cardiology Office Note:    Date:  09/29/2017   ID:  [name], DOB 1945-07-16, MRN [id number]  PCP:  Audrey Prose, MD  Cardiologist:  No primary care provider on file.  Pulmonologist:  Audrey Males, MD Neurologist:  Audrey Clines, DO  Referring MD: Audrey Prose, MD   Chief Complaint  Patient presents with  . Coronary Artery Disease    History of Present Illness:    [name] is a 72 y.o. female with coronary artery disease, combined systolic and diastolic congestive heart failure, emphysema, LBBB, hyperlipidemia (intol of statins) who is being seen today for the evaluation of CAD, chest pain, shortness of breath at the request of Audrey Prose, MD.   [name] was previously followed by Dr. Einar [name].  She underwent Cardiac Catheterization in 2012 that demonstrated high grade OM1 stenosis.  This has been treated medically due to difficulty engaging the vessel.  EF has previously been normal.  An Echo in 7/18 showed her LV function was worse with an EF of 35-40.  A Nuclear stress test in 8/18 did not demonstrate ischemia.  She noted worsening dyspnea on exertion and chest pain.  She had some improvement of her chest pain with adjustment in antianginal therapy.  However, she continued to be short of breath with minimal activity and R/L Cardiac Catheterization was arranged.  However, [name] has lost confidence in Dr. Einar [name] and she decided to establish with our practice.  She is here alone today.  She tells me that she started feeling poorly 4 years ago.  She notes exertional chest pain with associated dyspnea.  She feels it has gotten worse.  She notes symptoms if she walks quickly but denies symptoms going up steps, etc.  She notes 2 pillow orthopnea, but denies paroxysmal nocturnal dyspnea.  She denies syncope.  She denies assoc nausea or diaphoresis with her chest pain.  She does not improving symptoms with higher dose nitrates.  But, for unknown reasons, she is back on the lower dose  of Imdur now.    Prior CV studies:   The following studies were reviewed today:  High-resolution chest CT 02/10/2017 IMPRESSION: 1. No findings to explain the patient's given symptoms. 2. Aortic atherosclerosis (ICD10-170.0). Coronary artery calcification. 3.  Emphysema (ICD10-J43.9).  Nuclear stress test 01/20/2017 Soft tissue attenuation artifact (inferoseptal, apical septal defect), EF 40, consistent with nonischemic cardiomyopathy, no ischemia  Echo 12/31/2016 Moderate concentric LVH, grade 1 diastolic dysfunction, abnormal septal wall motion due to LBBB, moderate diffuse HK, EF 35-40, atrial septal aneurysm with possible PFO, mild TR  Echo 05/09/15 Abnormal septal motion, EF 50-55, mild LAE  Carotid US 05/09/15 bilat ICA 1-39  Cardiac catheterization 04/01/2011 EF 55, mid to distal anterolateral hypokinesis RCA normal LM normal LAD luminal irregularities LCx normal; OM1 80-90 (difficult to visualize) Treated medically  Past Medical History:  Diagnosis Date  . Anxiety   . Aortic atherosclerosis ([place]) 09/29/2017   High Res CT in 9/18: aortic atherosclerosis; coronary artery calcification  . Cervical disc disease    BULGING  . Chronic combined systolic and diastolic CHF (congestive heart failure) ([place]) 09/29/2017   Echo 7/18: Moderate concentric LVH, grade 1 diastolic dysfunction, abnormal septal wall motion due to LBBB, moderate diffuse HK, EF 35-40, atrial septal aneurysm with possible PFO, mild TR  . Complication of anesthesia    SLOW TO AWAKEN AFTER LAST COLONSCOPY  . Coronary artery disease 05/09/2015   LHC 10/12: EF 55, OM1 80-90 - difficult to engage >>  treated medically // Nuc 8/18: EF 40, no ischemia  . Depression   . DJD (degenerative joint disease)    OA  . Dysrhythmia    Not clear where this diagnosis came from  . Emphysema of lung ([place])    Dr. Chase [name]  . Fibromyalgia   . H/O: liver disease 24 YRS AGO   tranamanitis due to previous interferon - LFTs  improved off of  . History of MI (myocardial infarction) FEW YRS AGO  . History of TIA (transient ischemic attack)   . Hx of colonic polyps   . Hyperlipidemia    intol to statin - myalgias // ESPERION trial - patient stopped  . Multiple sclerosis ([place])   . Narcotic dependence (HCC)    hx of benzodiazepine and narcotic  . Osteoporosis   . Positive H. pylori test   . Urinary incontinence     Past Surgical History:  Procedure Laterality Date  . BACK SURGERY  11-2009   LOWER BACK  . BOTOX INJECTION N/A 03/21/2016   Procedure: BOTOX INJECTION;  Surgeon: Audrey Corner, MD;  Location: [facility];  Service: Endoscopy;  Laterality: N/A;  . CARDIAC CATHETERIZATION     UNSUCCESSFUL STENT PLACEMENT  . COLONSCOPY    . ESOPHAGEAL MANOMETRY N/A 10/23/2015   Procedure: ESOPHAGEAL MANOMETRY (EM);  Surgeon: Audrey Corner, MD;  Location: [facility];  Service: Endoscopy;  Laterality: N/A;  . ESOPHAGOGASTRODUODENOSCOPY (EGD) WITH PROPOFOL N/A 03/21/2016   Procedure: ESOPHAGOGASTRODUODENOSCOPY (EGD) WITH PROPOFOL;  Surgeon: Audrey Corner, MD;  Location: [facility];  Service: Endoscopy;  Laterality: N/A;  . TUBAL LIGATION      Current Medications: Current Meds  Medication Sig  . albuterol (PROAIR HFA) 108 (90 BASE) MCG/ACT inhaler Inhale 2 puffs into the lungs every 6 (six) hours as needed for wheezing or shortness of breath.   . Ascorbic Acid (VITAMIN C) 1000 MG tablet Take 1,000 mg by mouth daily.  [name] aspirin EC 81 MG tablet Take 162 mg by mouth daily.  . Cholecalciferol (VITAMIN D3) 3000 UNITS TABS Take 3,000 Units by mouth daily.  . clonazePAM (KLONOPIN) 1 MG tablet TAKE 1 TABLET BY MOUTH 3 TIMES DAILY  . co-enzyme Q-10 30 MG capsule Take 30 mg by mouth 3 (three) times daily.  [name] doxepin (SINEQUAN) 25 MG capsule Take 1 capsule (25 mg total) by mouth at bedtime.  [name] doxycycline (VIBRA-TABS) 100 MG tablet Take 1 tablet (100 mg total) by mouth 2 (two) times daily.  [name] gabapentin  (NEURONTIN) 300 MG capsule TAKE 1 CAPSULE BY MOUTH ONCE DAILY  . GLATOPA 20 MG/ML SOSY injection USE 1 INJECTION SUBCUTANEOUSLY ONCE A DAY  . guaiFENesin (MUCINEX) 600 MG 12 hr tablet Take by mouth as needed.  [name] ipratropium (ATROVENT) 0.02 % nebulizer solution Take 2.5 mLs (0.5 mg total) by nebulization 4 (four) times daily.  [name] LINZESS 290 MCG CAPS capsule Take 1 capsule by mouth as needed.   [name] losartan (COZAAR) 25 MG tablet Take 25 mg by mouth daily.  [name] lovastatin (MEVACOR) 40 MG tablet Take 20 mg by mouth daily.   . metoprolol succinate (TOPROL-XL) 50 MG 24 hr tablet Take 50 mg by mouth daily.  . modafinil (PROVIGIL) 200 MG tablet TAKE 1 TABLET BY MOUTH TWICE A DAY  . Multiple Vitamin (MULTIVITAMIN WITH MINERALS) TABS tablet Take 1 tablet by mouth daily.  Gean Birchwood ER 50 MG TB12 Take 1 tablet by mouth every 12 (twelve) hours.  [name] oxyCODONE-acetaminophen (PERCOCET) 5-325 MG per tablet Take 1  tablet by mouth every 4 (four) hours.   . polyethylene glycol powder (GLYCOLAX/MIRALAX) powder Take 17 g by mouth daily as needed. constipation  . PRISTIQ 50 MG 24 hr tablet Take 50 mg by mouth at bedtime.   [name] tiZANidine (ZANAFLEX) 4 MG tablet Take 2-4 mg by mouth 3 (three) times daily as needed. Reported on 08/30/2015  . umeclidinium bromide (INCRUSE ELLIPTA) 62.5 MCG/INH AEPB Inhale 1 puff into the lungs daily.  . [DISCONTINUED] isosorbide mononitrate (IMDUR) 30 MG 24 hr tablet      Allergies:   Crestor [rosuvastatin]; Amoxicillin; Contrast media [iodinated diagnostic agents]; and Sulfonamide derivatives   Social History   Socioeconomic History  . Marital status: Divorced    Spouse name: Not on file  . Number of children: 2  . Years of education: 12th  . Highest education level: Not on file  Occupational History  . Occupation: disability    Employer: RETIRED  Social Needs  . Financial resource strain: Not on file  . Food insecurity:    Worry: Not on file    Inability: Not on file  .  Transportation needs:    Medical: Not on file    Non-medical: Not on file  Tobacco Use  . Smoking status: Former Smoker    Packs/day: 1.00    Years: 48.00    Pack years: 48.00    Types: Cigarettes    Last attempt to quit: 11/16/2009    Years since quitting: 7.8  . Smokeless tobacco: Never Used  Substance and Sexual Activity  . Alcohol use: No  . Drug use: No  . Sexual activity: Not on file  Lifestyle  . Physical activity:    Days per week: Not on file    Minutes per session: Not on file  . Stress: Not on file  Relationships  . Social connections:    Talks on phone: Not on file    Gets together: Not on file    Attends religious service: Not on file    Active member of club or organization: Not on file    Attends meetings of clubs or organizations: Not on file    Relationship status: Not on file  Other Topics Concern  . Not on file  Social History Narrative   Patient lives at home alone. - [place], [place]   Caffeine Use: rarely   Retired - Used to be a Dance movement psychotherapist; owned a Company [employer]   Married; 2 children; 1 granddaughter     Family Hx: The patient's family history includes Cancer in her brother; Heart attack (age of onset: 68) in her father; Heart attack (age of onset: 20) in her mother.  ROS:   Please see the history of present illness.    Review of Systems  Cardiovascular: Positive for chest pain and irregular heartbeat.  Respiratory: Positive for shortness of breath.   Musculoskeletal: Positive for joint pain.   All other systems reviewed and are negative.   EKGs/Labs/Other Test Reviewed:    EKG:  EKG is  ordered today.  The ekg ordered today demonstrates normal sinus rhythm, HR 79, LBBB  Recent Labs: 03/04/2017: Creatinine, Ser 0.67   Recent Lipid Panel Lab Results  Component Value Date/Time   CHOL 201 (H) 05/09/2015 05:55 AM   TRIG 104 05/09/2015 05:55 AM   HDL 53 05/09/2015 05:55 AM   CHOLHDL 3.8 05/09/2015 05:55 AM   LDLCALC 127 (H)  05/09/2015 05:55 AM    Physical Exam:    VS:  BP [name])  150/90 (BP Location: Right Arm, Patient Position: Sitting)   Pulse 79   Ht 5\' 1"  (1.549 m)   Wt 121 lb (54.9 kg)   SpO2 99%   BMI 22.86 kg/m     Wt Readings from Last 3 Encounters:  09/29/17 121 lb (54.9 kg)  08/11/17 118 lb (53.5 kg)  08/08/17 119 lb 12.8 oz (54.3 kg)     Physical Exam  Constitutional: She is oriented to person, place, and time. She appears well-developed and well-nourished. No distress.  HENT:  Head: Normocephalic and atraumatic.  Neck: No JVD present. Carotid bruit is not present.  Cardiovascular: Normal rate and regular rhythm.  No murmur heard. Pulmonary/Chest: She has decreased breath sounds. She has no rales.  Abdominal: Soft. There is no hepatomegaly.  Musculoskeletal: She exhibits no edema.  Neurological: She is alert and oriented to person, place, and time.  Skin: Skin is warm and dry.    ASSESSMENT & PLAN:    Coronary artery disease involving native coronary artery of native heart with angina pectoris ([place]) She has a hx of high grade OM1 stenosis that has been treated medically due to difficulty engaging this vessel for PCI.  She previously had normal ejection fraction.  However, her EF dropped to 35-40 by echo last year.  A follow up Nuclear stress test demonstrated no ischemia and was c/w non-ischemic cardiomyopathy.  She had improved chest pain on higher dose nitrates, but continued to worsening shortness of breath.  She was previously seen by Dr. Einar [name].  But, she decided to transfer her care to our practice.  Prior to transferring, Dr. Einar [name] planned on a R and L heart catheterization.  Proceeding with a Cardiac Catheterization seems appropriate to further evaluate her anatomy and her filling pressures.  I discussed this with Dr. Caryl Comes (attending MD), who agreed.  Risks and benefits of cardiac catheterization have been discussed with the patient.  These include bleeding, infection, kidney damage,  stroke, heart attack, death.  The patient understands these risks and is willing to proceed.  She has an IV dye allergy.  -Arrange R/L cardiac catheterization  -Increase imdur to 60 mg daily  -Continue current dose of metoprolol, lovastatin  -IV dye allergy prophylaxis using 13-hour protocol  Chronic combined systolic and diastolic CHF (congestive heart failure) (HCC) EF is now 35-40.  Her echo was in 8/18.  I will arrange a follow-up echo to reassess her LV function.  Continue current dose of losartan, metoprolol.  Adjust isosorbide as noted.  She does not appear volume overloaded.  I will obtain a BNP.  If this is elevated, I will start her on low-dose furosemide.  If she has no obstructive coronary artery disease contributing to her cardiomyopathy, I will refer her to Dr. Caryl Comes for consideration of CRT.  Aortic atherosclerosis (HCC) Continue aspirin, statin.  Pulmonary emphysema, unspecified emphysema type [facility]) Follow-up with pulmonology as planned.  LBBB (left bundle branch block) Chronic.  Question if this is related to her cardiomyopathy.  If she does not have obstructive coronary disease contributing to her cardiomyopathy, refer to EP for consideration of CRT.  Hyperlipidemia, unspecified hyperlipidemia type Previously intolerant to statins.  She is able to tolerate low-dose lovastatin.   Dispo:  Return in about 2 weeks (around 10/13/2017) for Post Procedure Follow Up, w/ Richardson Dopp, PA-C.   Medication Adjustments/Labs and Tests Ordered: Current medicines are reviewed at length with the patient today.  Concerns regarding medicines are outlined above.  Orders/Tests:  Orders Placed This  Encounter  Procedures  . Basic metabolic panel  . CBC  . Pro b natriuretic peptide (BNP)  . EKG 12-Lead  . ECHOCARDIOGRAM COMPLETE   Medication changes: Meds ordered this encounter  Medications  . isosorbide mononitrate (IMDUR) 60 MG 24 hr tablet    Sig: Take 1 tablet (60 mg total) by  mouth daily.    Dispense:  90 tablet    Refill:  3  . predniSONE (DELTASONE) 50 MG tablet    Sig: TAKE 1 TABLET 12 HOURS PRIOR TO YOUR CATH TAKE 1 TABLET 6 HOURS PRIOR TO YOUR CATH TAKE THE LAST TABLET (ALONG WITH 1 BENADRYL 50 MG) AS YOU ARE ARRIVING TO THE HOSPITAL    Dispense:  36 tablet    Refill:  0   Signed, Richardson Dopp, PA-C  09/29/2017 4:58 PM    [facility] [place], [place], [place]  [address] Phone: [phone]; Fax: [phone]

## 2017-09-29 NOTE — Patient Instructions (Addendum)
Medication Instructions:  Your physician has recommended you make the following change in your medication:  1.  INCREASE the Imdur to 60 mg taking 1 tablet daily 2.  TAKE the 1ST PREDNISONE TABLET ON Wednesday, 10/01/17, AT 8:30 PM, TAKE THE SECOND TABLET 6 HOURS AFTER, WHICH IS AT 2:30 IN THE MORNING, TAKE THE LAST PREDNISONE ON YOUR WAY TO THE HOSPITAL THAT MORNING FOR YOUR PROCEDURE ALONG WITH 1 50 MG TABLET OF BENADRYL   Labwork: TODAY:  BMET, CBC, & PRO BNP  Testing/Procedures: Your physician has requested that you have an echocardiogram. Echocardiography is a painless test that uses sound waves to create images of your heart. It provides your doctor with information about the size and shape of your heart and how well your heart's chambers and valves are working. This procedure takes approximately one hour. There are no restrictions for this procedure.  Your physician has requested that you have a cardiac catheterization. Cardiac catheterization is used to diagnose and/or treat various heart conditions. Doctors may recommend this procedure for a number of different reasons. The most common reason is to evaluate chest pain. Chest pain can be a symptom of coronary artery disease (CAD), and cardiac catheterization can show whether plaque is narrowing or blocking your heart's arteries. This procedure is also used to evaluate the valves, as well as measure the blood flow and oxygen levels in different parts of your heart. For further information please visit [email or web address].[email or web address]. Please follow instruction sheet, as given.      [facility] [address] [place] [address] [place] [address] Dept: [phone] Loc: [place]  09/29/2017  You are scheduled for a Cardiac Catheterization on Thursday, May 2 with Dr. Harrell Gave End.  1. Please arrive at the [facility] (Main Entrance A) at [facility]: [place], [place] [address] at 8:30 AM (two hours before your procedure to ensure your preparation). Free valet parking service is available.   Special note: Every effort is made to have your procedure done on time. Please understand that emergencies sometimes delay scheduled procedures.  2. Diet: Do not eat or drink anything after midnight prior to your procedure except sips of water to take medications.  3. Labs: TODAY  4. Medication instructions in preparation for your procedure:  Because you have a allergy to the contrast dye:  FOLLOW THE INSTRUCTIONS BELOW:  TAKE the 1ST PREDNISONE TABLET ON Wednesday, 10/01/17, AT 8:30 PM, TAKE THE SECOND TABLET 6 HOURS AFTER, WHICH IS AT 2:30 IN THE MORNING, TAKE THE LAST PREDNISONE ON YOUR WAY TO THE HOSPITAL THAT MORNING FOR YOUR PROCEDURE ALONG WITH 1 50 MG TABLET OF BENADRYL   On the morning of your procedure, take your Aspirin and any morning medicines NOT listed above.  You may use sips of water.  5. Plan for one night stay--bring personal belongings. 6. Bring a current list of your medications and current insurance cards. 7. You MUST have a responsible person to drive you home. 8. Someone MUST be with you the first 24 hours after you arrive home or your discharge will be delayed. 9. Please wear clothes that are easy to get on and off and wear slip-on shoes.  Thank you for allowing [place] to care for you!   -- [facility]  Follow-Up: 10-15-17 ARRIVE AT 11:00 TO SEE SCOTT WEAVER, PA-C  Any Other Special Instructions Will Be Listed  Below (If Applicable).   Echocardiogram An echocardiogram, or echocardiography, uses sound waves (ultrasound) to produce an image of your heart. The echocardiogram is simple, painless, obtained within a short period of time, and offers valuable information to your health care provider. The images from an echocardiogram can provide information such  as:  Evidence of coronary artery disease (CAD).  Heart size.  Heart muscle function.  Heart valve function.  Aneurysm detection.  Evidence of a past heart attack.  Fluid buildup around the heart.  Heart muscle thickening.  Assess heart valve function.  Tell a health care provider about:  Any allergies you have.  All medicines you are taking, including vitamins, herbs, eye drops, creams, and over-the-counter medicines.  Any problems you or family members have had with anesthetic medicines.  Any blood disorders you have.  Any surgeries you have had.  Any medical conditions you have.  Whether you are pregnant or may be pregnant. What happens before the procedure? No special preparation is needed. Eat and drink normally. What happens during the procedure?  In order to produce an image of your heart, gel will be applied to your chest and a wand-like tool (transducer) will be moved over your chest. The gel will help transmit the sound waves from the transducer. The sound waves will harmlessly bounce off your heart to allow the heart images to be captured in real-time motion. These images will then be recorded.  You may need an IV to receive a medicine that improves the quality of the pictures. What happens after the procedure? You may return to your normal schedule including diet, activities, and medicines, unless your health care provider tells you otherwise. This information is not intended to replace advice given to you by your health care provider. Make sure you discuss any questions you have with your health care provider. Document Released: 05/17/2000 Document Revised: 01/06/2016 Document Reviewed: 01/25/2013 Elsevier Interactive Patient Education  2017 [employer].   If you need a refill on your cardiac medications before your next appointment, please call your pharmacy.

## 2017-09-29 NOTE — Progress Notes (Signed)
Cardiology Office Note:    Date:  09/29/2017   ID:  [name], DOB 1945-08-28, MRN [id number]  PCP:  Donald Prose, MD  Cardiologist:  No primary care provider on file.  Pulmonologist:  Brand Males, MD Neurologist:  Metta Clines, DO  Referring MD: Donald Prose, MD   Chief Complaint  Patient presents with  . Coronary Artery Disease    History of Present Illness:    [name] is a 72 y.o. female with coronary artery disease, combined systolic and diastolic congestive heart failure, emphysema, LBBB, hyperlipidemia (intol of statins) who is being seen today for the evaluation of CAD, chest pain, shortness of breath at the request of Donald Prose, MD.   [name] was previously followed by Dr. Einar Gip.  She underwent Cardiac Catheterization in 2012 that demonstrated high grade OM1 stenosis.  This has been treated medically due to difficulty engaging the vessel.  EF has previously been normal.  An Echo in 7/18 showed her LV function was worse with an EF of 35-40.  A Nuclear stress test in 8/18 did not demonstrate ischemia.  She noted worsening dyspnea on exertion and chest pain.  She had some improvement of her chest pain with adjustment in antianginal therapy.  However, she continued to be short of breath with minimal activity and R/L Cardiac Catheterization was arranged.  However, [name] has lost confidence in Dr. Einar Gip and she decided to establish with our practice.  She is here alone today.  She tells me that she started feeling poorly 4 years ago.  She notes exertional chest pain with associated dyspnea.  She feels it has gotten worse.  She notes symptoms if she walks quickly but denies symptoms going up steps, etc.  She notes 2 pillow orthopnea, but denies paroxysmal nocturnal dyspnea.  She denies syncope.  She denies assoc nausea or diaphoresis with her chest pain.  She does not improving symptoms with higher dose nitrates.  But, for unknown reasons, she is back on the lower dose  of Imdur now.    Prior CV studies:   The following studies were reviewed today:  High-resolution chest CT 02/10/2017 IMPRESSION: 1. No findings to explain the patient's given symptoms. 2. Aortic atherosclerosis (ICD10-170.0). Coronary artery calcification. 3.  Emphysema (ICD10-J43.9).  Nuclear stress test 01/20/2017 Soft tissue attenuation artifact (inferoseptal, apical septal defect), EF 40, consistent with nonischemic cardiomyopathy, no ischemia  Echo 12/31/2016 Moderate concentric LVH, grade 1 diastolic dysfunction, abnormal septal wall motion due to LBBB, moderate diffuse HK, EF 35-40, atrial septal aneurysm with possible PFO, mild TR  Echo 05/09/15 Abnormal septal motion, EF 50-55, mild LAE  Carotid US 05/09/15 bilat ICA 1-39  Cardiac catheterization 04/01/2011 EF 55, mid to distal anterolateral hypokinesis RCA normal LM normal LAD luminal irregularities LCx normal; OM1 80-90 (difficult to visualize) Treated medically  Past Medical History:  Diagnosis Date  . Anxiety   . Aortic atherosclerosis ([place]) 09/29/2017   High Res CT in 9/18: aortic atherosclerosis; coronary artery calcification  . Cervical disc disease    BULGING  . Chronic combined systolic and diastolic CHF (congestive heart failure) ([place]) 09/29/2017   Echo 7/18: Moderate concentric LVH, grade 1 diastolic dysfunction, abnormal septal wall motion due to LBBB, moderate diffuse HK, EF 35-40, atrial septal aneurysm with possible PFO, mild TR  . Complication of anesthesia    SLOW TO AWAKEN AFTER LAST COLONSCOPY  . Coronary artery disease 05/09/2015   LHC 10/12: EF 55, OM1 80-90 - difficult to engage >>  treated medically // Nuc 8/18: EF 40, no ischemia  . Depression   . DJD (degenerative joint disease)    OA  . Dysrhythmia    Not clear where this diagnosis came from  . Emphysema of lung ([place])    Dr. Chase Caller  . Fibromyalgia   . H/O: liver disease 57 YRS AGO   tranamanitis due to previous interferon - LFTs  improved off of  . History of MI (myocardial infarction) FEW YRS AGO  . History of TIA (transient ischemic attack)   . Hx of colonic polyps   . Hyperlipidemia    intol to statin - myalgias // ESPERION trial - patient stopped  . Multiple sclerosis ([place])   . Narcotic dependence (HCC)    hx of benzodiazepine and narcotic  . Osteoporosis   . Positive H. pylori test   . Urinary incontinence     Past Surgical History:  Procedure Laterality Date  . BACK SURGERY  11-2009   LOWER BACK  . BOTOX INJECTION N/A 03/21/2016   Procedure: BOTOX INJECTION;  Surgeon: Wilford Corner, MD;  Location: [facility];  Service: Endoscopy;  Laterality: N/A;  . CARDIAC CATHETERIZATION     UNSUCCESSFUL STENT PLACEMENT  . COLONSCOPY    . ESOPHAGEAL MANOMETRY N/A 10/23/2015   Procedure: ESOPHAGEAL MANOMETRY (EM);  Surgeon: Wilford Corner, MD;  Location: [facility];  Service: Endoscopy;  Laterality: N/A;  . ESOPHAGOGASTRODUODENOSCOPY (EGD) WITH PROPOFOL N/A 03/21/2016   Procedure: ESOPHAGOGASTRODUODENOSCOPY (EGD) WITH PROPOFOL;  Surgeon: Wilford Corner, MD;  Location: [facility];  Service: Endoscopy;  Laterality: N/A;  . TUBAL LIGATION      Current Medications: Current Meds  Medication Sig  . albuterol (PROAIR HFA) 108 (90 BASE) MCG/ACT inhaler Inhale 2 puffs into the lungs every 6 (six) hours as needed for wheezing or shortness of breath.   . Ascorbic Acid (VITAMIN C) 1000 MG tablet Take 1,000 mg by mouth daily.  [name] aspirin EC 81 MG tablet Take 162 mg by mouth daily.  . Cholecalciferol (VITAMIN D3) 3000 UNITS TABS Take 3,000 Units by mouth daily.  . clonazePAM (KLONOPIN) 1 MG tablet TAKE 1 TABLET BY MOUTH 3 TIMES DAILY  . co-enzyme Q-10 30 MG capsule Take 30 mg by mouth 3 (three) times daily.  [name] doxepin (SINEQUAN) 25 MG capsule Take 1 capsule (25 mg total) by mouth at bedtime.  [name] doxycycline (VIBRA-TABS) 100 MG tablet Take 1 tablet (100 mg total) by mouth 2 (two) times daily.  [name] gabapentin  (NEURONTIN) 300 MG capsule TAKE 1 CAPSULE BY MOUTH ONCE DAILY  . GLATOPA 20 MG/ML SOSY injection USE 1 INJECTION SUBCUTANEOUSLY ONCE A DAY  . guaiFENesin (MUCINEX) 600 MG 12 hr tablet Take by mouth as needed.  [name] ipratropium (ATROVENT) 0.02 % nebulizer solution Take 2.5 mLs (0.5 mg total) by nebulization 4 (four) times daily.  [name] LINZESS 290 MCG CAPS capsule Take 1 capsule by mouth as needed.   [name] losartan (COZAAR) 25 MG tablet Take 25 mg by mouth daily.  [name] lovastatin (MEVACOR) 40 MG tablet Take 20 mg by mouth daily.   . metoprolol succinate (TOPROL-XL) 50 MG 24 hr tablet Take 50 mg by mouth daily.  . modafinil (PROVIGIL) 200 MG tablet TAKE 1 TABLET BY MOUTH TWICE A DAY  . Multiple Vitamin (MULTIVITAMIN WITH MINERALS) TABS tablet Take 1 tablet by mouth daily.  Gean Birchwood ER 50 MG TB12 Take 1 tablet by mouth every 12 (twelve) hours.  [name] oxyCODONE-acetaminophen (PERCOCET) 5-325 MG per tablet Take 1  tablet by mouth every 4 (four) hours.   . polyethylene glycol powder (GLYCOLAX/MIRALAX) powder Take 17 g by mouth daily as needed. constipation  . PRISTIQ 50 MG 24 hr tablet Take 50 mg by mouth at bedtime.   [name] tiZANidine (ZANAFLEX) 4 MG tablet Take 2-4 mg by mouth 3 (three) times daily as needed. Reported on 08/30/2015  . umeclidinium bromide (INCRUSE ELLIPTA) 62.5 MCG/INH AEPB Inhale 1 puff into the lungs daily.  . [DISCONTINUED] isosorbide mononitrate (IMDUR) 30 MG 24 hr tablet      Allergies:   Crestor [rosuvastatin]; Amoxicillin; Contrast media [iodinated diagnostic agents]; and Sulfonamide derivatives   Social History   Socioeconomic History  . Marital status: Divorced    Spouse name: Not on file  . Number of children: 2  . Years of education: 12th  . Highest education level: Not on file  Occupational History  . Occupation: disability    Employer: RETIRED  Social Needs  . Financial resource strain: Not on file  . Food insecurity:    Worry: Not on file    Inability: Not on file  .  Transportation needs:    Medical: Not on file    Non-medical: Not on file  Tobacco Use  . Smoking status: Former Smoker    Packs/day: 1.00    Years: 48.00    Pack years: 48.00    Types: Cigarettes    Last attempt to quit: 11/16/2009    Years since quitting: 7.8  . Smokeless tobacco: Never Used  Substance and Sexual Activity  . Alcohol use: No  . Drug use: No  . Sexual activity: Not on file  Lifestyle  . Physical activity:    Days per week: Not on file    Minutes per session: Not on file  . Stress: Not on file  Relationships  . Social connections:    Talks on phone: Not on file    Gets together: Not on file    Attends religious service: Not on file    Active member of club or organization: Not on file    Attends meetings of clubs or organizations: Not on file    Relationship status: Not on file  Other Topics Concern  . Not on file  Social History Narrative   Patient lives at home alone. - [place], [place]   Caffeine Use: rarely   Retired - Used to be a Dance movement psychotherapist; owned a Company secretary   Married; 2 children; 1 granddaughter     Family Hx: The patient's family history includes Cancer in her brother; Heart attack (age of onset: 46) in her father; Heart attack (age of onset: 31) in her mother.  ROS:   Please see the history of present illness.    Review of Systems  Cardiovascular: Positive for chest pain and irregular heartbeat.  Respiratory: Positive for shortness of breath.   Musculoskeletal: Positive for joint pain.   All other systems reviewed and are negative.   EKGs/Labs/Other Test Reviewed:    EKG:  EKG is  ordered today.  The ekg ordered today demonstrates normal sinus rhythm, HR 79, LBBB  Recent Labs: 03/04/2017: Creatinine, Ser 0.67   Recent Lipid Panel Lab Results  Component Value Date/Time   CHOL 201 (H) 05/09/2015 05:55 AM   TRIG 104 05/09/2015 05:55 AM   HDL 53 05/09/2015 05:55 AM   CHOLHDL 3.8 05/09/2015 05:55 AM   LDLCALC 127 (H)  05/09/2015 05:55 AM    Physical Exam:    VS:  BP [name])  150/90 (BP Location: Right Arm, Patient Position: Sitting)   Pulse 79   Ht 5\' 1"  (1.549 m)   Wt 121 lb (54.9 kg)   SpO2 99%   BMI 22.86 kg/m     Wt Readings from Last 3 Encounters:  09/29/17 121 lb (54.9 kg)  08/11/17 118 lb (53.5 kg)  08/08/17 119 lb 12.8 oz (54.3 kg)     Physical Exam  Constitutional: She is oriented to person, place, and time. She appears well-developed and well-nourished. No distress.  HENT:  Head: Normocephalic and atraumatic.  Neck: No JVD present. Carotid bruit is not present.  Cardiovascular: Normal rate and regular rhythm.  No murmur heard. Pulmonary/Chest: She has decreased breath sounds. She has no rales.  Abdominal: Soft. There is no hepatomegaly.  Musculoskeletal: She exhibits no edema.  Neurological: She is alert and oriented to person, place, and time.  Skin: Skin is warm and dry.    ASSESSMENT & PLAN:    Coronary artery disease involving native coronary artery of native heart with angina pectoris ([place]) She has a hx of high grade OM1 stenosis that has been treated medically due to difficulty engaging this vessel for PCI.  She previously had normal ejection fraction.  However, her EF dropped to 35-40 by echo last year.  A follow up Nuclear stress test demonstrated no ischemia and was c/w non-ischemic cardiomyopathy.  She had improved chest pain on higher dose nitrates, but continued to worsening shortness of breath.  She was previously seen by Dr. Einar Gip.  But, she decided to transfer her care to our practice.  Prior to transferring, Dr. Einar Gip planned on a R and L heart catheterization.  Proceeding with a Cardiac Catheterization seems appropriate to further evaluate her anatomy and her filling pressures.  I discussed this with Dr. Caryl Comes (attending MD), who agreed.  Risks and benefits of cardiac catheterization have been discussed with the patient.  These include bleeding, infection, kidney damage,  stroke, heart attack, death.  The patient understands these risks and is willing to proceed.  She has an IV dye allergy.  -Arrange R/L cardiac catheterization  -Increase imdur to 60 mg daily  -Continue current dose of metoprolol, lovastatin  -IV dye allergy prophylaxis using 13-hour protocol  Chronic combined systolic and diastolic CHF (congestive heart failure) (HCC) EF is now 35-40.  Her echo was in 8/18.  I will arrange a follow-up echo to reassess her LV function.  Continue current dose of losartan, metoprolol.  Adjust isosorbide as noted.  She does not appear volume overloaded.  I will obtain a BNP.  If this is elevated, I will start her on low-dose furosemide.  If she has no obstructive coronary artery disease contributing to her cardiomyopathy, I will refer her to Dr. Caryl Comes for consideration of CRT.  Aortic atherosclerosis (HCC) Continue aspirin, statin.  Pulmonary emphysema, unspecified emphysema type [facility]) Follow-up with pulmonology as planned.  LBBB (left bundle branch block) Chronic.  Question if this is related to her cardiomyopathy.  If she does not have obstructive coronary disease contributing to her cardiomyopathy, refer to EP for consideration of CRT.  Hyperlipidemia, unspecified hyperlipidemia type Previously intolerant to statins.  She is able to tolerate low-dose lovastatin.   Dispo:  Return in about 2 weeks (around 10/13/2017) for Post Procedure Follow Up, w/ Richardson Dopp, PA-C.   Medication Adjustments/Labs and Tests Ordered: Current medicines are reviewed at length with the patient today.  Concerns regarding medicines are outlined above.  Orders/Tests:  Orders Placed This  Encounter  Procedures  . Basic metabolic panel  . CBC  . Pro b natriuretic peptide (BNP)  . EKG 12-Lead  . ECHOCARDIOGRAM COMPLETE   Medication changes: Meds ordered this encounter  Medications  . isosorbide mononitrate (IMDUR) 60 MG 24 hr tablet    Sig: Take 1 tablet (60 mg total) by  mouth daily.    Dispense:  90 tablet    Refill:  3  . predniSONE (DELTASONE) 50 MG tablet    Sig: TAKE 1 TABLET 12 HOURS PRIOR TO YOUR CATH TAKE 1 TABLET 6 HOURS PRIOR TO YOUR CATH TAKE THE LAST TABLET (ALONG WITH 1 BENADRYL 50 MG) AS YOU ARE ARRIVING TO THE HOSPITAL    Dispense:  36 tablet    Refill:  0   Signed, Richardson Dopp, PA-C  09/29/2017 4:58 PM    [facility] [place], [place], [place]  [address] Phone: [phone]; Fax: [phone]

## 2017-09-30 ENCOUNTER — Telehealth: Payer: Self-pay | Admitting: *Deleted

## 2017-09-30 LAB — BASIC METABOLIC PANEL
BUN/Creatinine Ratio: 15 (ref 12–28)
BUN: 12 mg/dL (ref 8–27)
CALCIUM: 9.6 mg/dL (ref 8.7–10.3)
CHLORIDE: 94 mmol/L — AB (ref 96–106)
CO2: 26 mmol/L (ref 20–29)
Creatinine, Ser: 0.81 mg/dL (ref 0.57–1.00)
GFR calc Af Amer: 84 mL/min/{1.73_m2} (ref >59)
GFR calc non Af Amer: 73 mL/min/{1.73_m2} (ref >59)
GLUCOSE: 87 mg/dL (ref 65–99)
POTASSIUM: 4.1 mmol/L (ref 3.5–5.2)
Sodium: 135 mmol/L (ref 134–144)

## 2017-09-30 LAB — CBC
HEMOGLOBIN: 11.9 g/dL (ref 11.1–15.9)
Hematocrit: 35.5 % (ref 34.0–46.6)
MCH: 29.9 pg (ref 26.6–33.0)
MCHC: 33.5 g/dL (ref 31.5–35.7)
MCV: 89 fL (ref 79–97)
Platelets: 335 10*3/uL (ref 150–379)
RBC: 3.98 x10E6/uL (ref 3.77–5.28)
RDW: 13.9 % (ref 12.3–15.4)
WBC: 8.4 10*3/uL (ref 3.4–10.8)

## 2017-09-30 LAB — PRO B NATRIURETIC PEPTIDE: NT-Pro BNP: 305 pg/mL — ABNORMAL HIGH (ref 0–301)

## 2017-09-30 NOTE — Telephone Encounter (Signed)
Pt contacted pre-catheterization scheduled at [facility] for: Thursday May 2,2019 10:30 AM Verified arrival time and place: [facility] Main Entrance A at: 8 AM  No solid food after midnight prior to cath, clear liquids until 5 AM Verified allergies in Epic contrast allergy- pt given pre-cath instructions for 13 hour Prednisone and Benadryl Prep Verified diabetes medications.  AM meds can be  taken pre-cath with sip of water including: ASA 81 mg Prednisone 50 mg Benadryl 50 mg  Confirmed patient has responsible person to drive home post procedure and observe patient for 24 hours: yes

## 2017-10-02 ENCOUNTER — Telehealth: Payer: Self-pay | Admitting: Internal Medicine

## 2017-10-02 ENCOUNTER — Telehealth: Payer: Self-pay | Admitting: Physician Assistant

## 2017-10-02 ENCOUNTER — Ambulatory Visit (HOSPITAL_COMMUNITY)
Admission: RE | Disposition: A | Discharge status: Home / Self Care | Payer: Self-pay | Source: Ambulatory Visit | Attending: Internal Medicine

## 2017-10-02 ENCOUNTER — Ambulatory Visit (HOSPITAL_COMMUNITY)
Admission: RE | Admit: 2017-10-02 | Discharge: 2017-10-02 | Disposition: A | Discharge status: Home / Self Care | Payer: Medicare Other | Source: Ambulatory Visit | Attending: Internal Medicine | Admitting: Internal Medicine

## 2017-10-02 DIAGNOSIS — Z888 Allergy status to other drugs, medicaments and biological substances status: Secondary | ICD-10-CM | POA: Diagnosis not present

## 2017-10-02 DIAGNOSIS — I25119 Atherosclerotic heart disease of native coronary artery with unspecified angina pectoris: Secondary | ICD-10-CM

## 2017-10-02 DIAGNOSIS — Z882 Allergy status to sulfonamides status: Secondary | ICD-10-CM | POA: Insufficient documentation

## 2017-10-02 DIAGNOSIS — Z8249 Family history of ischemic heart disease and other diseases of the circulatory system: Secondary | ICD-10-CM | POA: Diagnosis not present

## 2017-10-02 DIAGNOSIS — F1921 Other psychoactive substance dependence, in remission: Secondary | ICD-10-CM | POA: Insufficient documentation

## 2017-10-02 DIAGNOSIS — I252 Old myocardial infarction: Secondary | ICD-10-CM | POA: Insufficient documentation

## 2017-10-02 DIAGNOSIS — M1990 Unspecified osteoarthritis, unspecified site: Secondary | ICD-10-CM | POA: Diagnosis not present

## 2017-10-02 DIAGNOSIS — R0781 Pleurodynia: Secondary | ICD-10-CM | POA: Diagnosis present

## 2017-10-02 DIAGNOSIS — I2584 Coronary atherosclerosis due to calcified coronary lesion: Secondary | ICD-10-CM | POA: Diagnosis not present

## 2017-10-02 DIAGNOSIS — I700 Atherosclerosis of aorta: Secondary | ICD-10-CM | POA: Diagnosis not present

## 2017-10-02 DIAGNOSIS — Z8673 Personal history of transient ischemic attack (TIA), and cerebral infarction without residual deficits: Secondary | ICD-10-CM | POA: Insufficient documentation

## 2017-10-02 DIAGNOSIS — M797 Fibromyalgia: Secondary | ICD-10-CM | POA: Diagnosis not present

## 2017-10-02 DIAGNOSIS — J439 Emphysema, unspecified: Secondary | ICD-10-CM | POA: Insufficient documentation

## 2017-10-02 DIAGNOSIS — Z7982 Long term (current) use of aspirin: Secondary | ICD-10-CM | POA: Insufficient documentation

## 2017-10-02 DIAGNOSIS — I253 Aneurysm of heart: Secondary | ICD-10-CM | POA: Insufficient documentation

## 2017-10-02 DIAGNOSIS — Z87891 Personal history of nicotine dependence: Secondary | ICD-10-CM | POA: Insufficient documentation

## 2017-10-02 DIAGNOSIS — I5042 Chronic combined systolic (congestive) and diastolic (congestive) heart failure: Secondary | ICD-10-CM

## 2017-10-02 DIAGNOSIS — Z9851 Tubal ligation status: Secondary | ICD-10-CM | POA: Insufficient documentation

## 2017-10-02 DIAGNOSIS — F329 Major depressive disorder, single episode, unspecified: Secondary | ICD-10-CM | POA: Insufficient documentation

## 2017-10-02 DIAGNOSIS — Z86010 Personal history of colonic polyps: Secondary | ICD-10-CM | POA: Insufficient documentation

## 2017-10-02 DIAGNOSIS — I447 Left bundle-branch block, unspecified: Secondary | ICD-10-CM | POA: Insufficient documentation

## 2017-10-02 DIAGNOSIS — G35 Multiple sclerosis: Secondary | ICD-10-CM | POA: Diagnosis not present

## 2017-10-02 DIAGNOSIS — Z9889 Other specified postprocedural states: Secondary | ICD-10-CM | POA: Insufficient documentation

## 2017-10-02 DIAGNOSIS — E785 Hyperlipidemia, unspecified: Secondary | ICD-10-CM | POA: Insufficient documentation

## 2017-10-02 DIAGNOSIS — Z880 Allergy status to penicillin: Secondary | ICD-10-CM | POA: Diagnosis not present

## 2017-10-02 DIAGNOSIS — F419 Anxiety disorder, unspecified: Secondary | ICD-10-CM | POA: Diagnosis not present

## 2017-10-02 DIAGNOSIS — Z79899 Other long term (current) drug therapy: Secondary | ICD-10-CM | POA: Insufficient documentation

## 2017-10-02 DIAGNOSIS — Z91041 Radiographic dye allergy status: Secondary | ICD-10-CM | POA: Insufficient documentation

## 2017-10-02 HISTORY — PX: RIGHT/LEFT HEART CATH AND CORONARY ANGIOGRAPHY: CATH118266

## 2017-10-02 LAB — POCT I-STAT 3, VENOUS BLOOD GAS (G3P V)
Bicarbonate: 25.7 mmol/L (ref 20.0–28.0)
O2 SAT: 66 %
TCO2: 27 mmol/L (ref 22–32)
pCO2, Ven: 44 mmHg (ref 44.0–60.0)
pH, Ven: 7.375 (ref 7.250–7.430)
pO2, Ven: 35 mmHg (ref 32.0–45.0)

## 2017-10-02 LAB — POCT I-STAT 3, ART BLOOD GAS (G3+)
Bicarbonate: 25.2 mmol/L (ref 20.0–28.0)
O2 Saturation: 94 %
TCO2: 27 mmol/L (ref 22–32)
pCO2 arterial: 42.8 mmHg (ref 32.0–48.0)
pH, Arterial: 7.379 (ref 7.350–7.450)
pO2, Arterial: 75 mmHg — ABNORMAL LOW (ref 83.0–108.0)

## 2017-10-02 SURGERY — RIGHT/LEFT HEART CATH AND CORONARY ANGIOGRAPHY
Anesthesia: LOCAL

## 2017-10-02 MED ORDER — SODIUM CHLORIDE 0.9 % WEIGHT BASED INFUSION
3.0000 mL/kg/h | INTRAVENOUS | Status: DC
  Administered 2017-10-02: 3 mL/kg/h via INTRAVENOUS

## 2017-10-02 MED ORDER — SODIUM CHLORIDE 0.9 % IV SOLN: INTRAVENOUS | Status: DC

## 2017-10-02 MED ORDER — HEPARIN SODIUM (PORCINE) 1000 UNIT/ML IJ SOLN
INTRAMUSCULAR | Status: AC
  Filled 2017-10-02: qty 1

## 2017-10-02 MED ORDER — HEPARIN (PORCINE) IN NACL 2-0.9 UNITS/ML
INTRAMUSCULAR | Status: AC | PRN
  Administered 2017-10-02 (×2): 500 mL

## 2017-10-02 MED ORDER — HEPARIN SODIUM (PORCINE) 1000 UNIT/ML IJ SOLN
INTRAMUSCULAR | Status: DC | PRN
  Administered 2017-10-02: 2500 [IU] via INTRAVENOUS

## 2017-10-02 MED ORDER — MIDAZOLAM HCL 2 MG/2ML IJ SOLN
INTRAMUSCULAR | Status: DC | PRN
  Administered 2017-10-02: 1 mg via INTRAVENOUS

## 2017-10-02 MED ORDER — SODIUM CHLORIDE 0.9 % IV SOLN: 250.0000 mL | INTRAVENOUS | Status: DC | PRN

## 2017-10-02 MED ORDER — DIPHENHYDRAMINE HCL 50 MG/ML IJ SOLN: 50.0000 mg | Freq: Once | INTRAMUSCULAR | Status: DC

## 2017-10-02 MED ORDER — VERAPAMIL HCL 2.5 MG/ML IV SOLN
INTRAVENOUS | Status: DC | PRN
  Administered 2017-10-02: 10 mL via INTRA_ARTERIAL

## 2017-10-02 MED ORDER — NITROGLYCERIN 1 MG/10 ML FOR IR/CATH LAB
INTRA_ARTERIAL | Status: DC | PRN
  Administered 2017-10-02: 200 ug via INTRACORONARY
  Administered 2017-10-02: 200 ug via INTRA_ARTERIAL

## 2017-10-02 MED ORDER — ONDANSETRON HCL 4 MG/2ML IJ SOLN: 4.0000 mg | Freq: Four times a day (QID) | INTRAMUSCULAR | Status: DC | PRN

## 2017-10-02 MED ORDER — SODIUM CHLORIDE 0.9% FLUSH: 3.0000 mL | Freq: Two times a day (BID) | INTRAVENOUS | Status: DC

## 2017-10-02 MED ORDER — SODIUM CHLORIDE 0.9% FLUSH: 3.0000 mL | INTRAVENOUS | Status: DC | PRN

## 2017-10-02 MED ORDER — DIPHENHYDRAMINE HCL 25 MG PO CAPS: 50.0000 mg | ORAL_CAPSULE | Freq: Once | ORAL | Status: DC

## 2017-10-02 MED ORDER — MIDAZOLAM HCL 2 MG/2ML IJ SOLN
INTRAMUSCULAR | Status: AC
  Filled 2017-10-02: qty 2

## 2017-10-02 MED ORDER — FENTANYL CITRATE (PF) 100 MCG/2ML IJ SOLN
INTRAMUSCULAR | Status: DC | PRN
  Administered 2017-10-02: 25 ug via INTRAVENOUS

## 2017-10-02 MED ORDER — ASPIRIN 81 MG PO CHEW: 81.0000 mg | CHEWABLE_TABLET | ORAL | Status: DC

## 2017-10-02 MED ORDER — LIDOCAINE HCL (PF) 1 % IJ SOLN
INTRAMUSCULAR | Status: AC
  Filled 2017-10-02: qty 30

## 2017-10-02 MED ORDER — LIDOCAINE HCL (PF) 1 % IJ SOLN
INTRAMUSCULAR | Status: DC | PRN
  Administered 2017-10-02: 5 mL

## 2017-10-02 MED ORDER — SODIUM CHLORIDE 0.9 % WEIGHT BASED INFUSION
1.0000 mL/kg/h | INTRAVENOUS | Status: DC
  Administered 2017-10-02: 1 mL/kg/h via INTRAVENOUS

## 2017-10-02 MED ORDER — HEPARIN (PORCINE) IN NACL 1000-0.9 UT/500ML-% IV SOLN
INTRAVENOUS | Status: AC
  Filled 2017-10-02: qty 1000

## 2017-10-02 MED ORDER — FENTANYL CITRATE (PF) 100 MCG/2ML IJ SOLN
INTRAMUSCULAR | Status: AC
  Filled 2017-10-02: qty 2

## 2017-10-02 MED ORDER — ACETAMINOPHEN 325 MG PO TABS: 650.0000 mg | ORAL_TABLET | ORAL | Status: DC | PRN

## 2017-10-02 MED ORDER — VERAPAMIL HCL 2.5 MG/ML IV SOLN
INTRAVENOUS | Status: AC
  Filled 2017-10-02: qty 2

## 2017-10-02 SURGICAL SUPPLY — 16 items
BAND CMPR LRG ZPHR (HEMOSTASIS) ×1
BAND ZEPHYR COMPRESS 30 LONG (HEMOSTASIS) ×1 IMPLANT
CATH BALLN WEDGE 5F 110CM (CATHETERS) ×1 IMPLANT
CATH INFINITI 5 FR JL3.5 (CATHETERS) ×1 IMPLANT
CATH INFINITI JR4 5F (CATHETERS) ×1 IMPLANT
ELECT DEFIB PAD ADLT CADENCE (PAD) ×1 IMPLANT
GLIDESHEATH SLEND SS 6F .021 (SHEATH) ×1 IMPLANT
GUIDEWIRE .025 260CM (WIRE) ×1 IMPLANT
GUIDEWIRE INQWIRE 1.5J.035X260 (WIRE) IMPLANT
INQWIRE 1.5J .035X260CM (WIRE) ×2
KIT HEART LEFT (KITS) ×2 IMPLANT
PACK CARDIAC CATHETERIZATION (CUSTOM PROCEDURE TRAY) ×2 IMPLANT
SHEATH RAIN 4/5FR (SHEATH) ×1 IMPLANT
TRANSDUCER W/STOPCOCK (MISCELLANEOUS) ×2 IMPLANT
TUBING CIL FLEX 10 FLL-RA (TUBING) ×2 IMPLANT
WIRE HI TORQ VERSACORE-J 145CM (WIRE) ×1 IMPLANT

## 2017-10-02 NOTE — Telephone Encounter (Signed)
Cath results were discussed with the patient during and after the procedure.  She has non-obstructive CAD, similar to 2012, and no evidence of heart failure by RHC.  She should not do any pushing, pulling, or lifting > 5 pounds with the right arm x 3 days; therefore she should not work in the garden this weekend.  She can increase her activity gradually beginning next week.  Her prognosis from a heart standpoint is fair.  She should follow-up with Richardson Dopp, as previously arranged.  Nelva Bush, MD [facility] Pager: [phone]

## 2017-10-02 NOTE — Discharge Instructions (Signed)
**Note -identified via Obfuscation** Radial Site Care °Refer to this sheet in the next few weeks. These instructions provide you with information about caring for yourself after your procedure. Your health care provider may also give you more specific instructions. Your treatment has been planned according to current medical practices, but problems sometimes occur. Call your health care provider if you have any problems or questions after your procedure. °What can I expect after the procedure? °After your procedure, it is typical to have the following: °· Bruising at the radial site that usually fades within 1-2 weeks. °· Blood collecting in the tissue (hematoma) that may be painful to the touch. It should usually decrease in size and tenderness within 1-2 weeks. ° °Follow these instructions at home: °· Take medicines only as directed by your health care provider. °· You may shower 24-48 hours after the procedure or as directed by your health care provider. Remove the bandage (dressing) and gently wash the site with plain soap and water. Pat the area dry with a clean towel. Do not rub the site, because this may cause bleeding. °· Do not take baths, swim, or use a hot tub until your health care provider approves. °· Check your insertion site every day for redness, swelling, or drainage. °· Do not apply powder or lotion to the site. °· Do not flex or bend the affected arm for 24 hours or as directed by your health care provider. °· Do not push or pull heavy objects with the affected arm for 24 hours or as directed by your health care provider. °· Do not lift over 10 lb (4.5 kg) for 5 days after your procedure or as directed by your health care provider. °· Ask your health care provider when it is okay to: °? Return to work or school. °? Resume usual physical activities or sports. °? Resume sexual activity. °· Do not drive home if you are discharged the same day as the procedure. Have someone else drive you. °· You may drive 24 hours after the procedure  unless otherwise instructed by your health care provider. °· Do not operate machinery or power tools for 24 hours after the procedure. °· If your procedure was done as an outpatient procedure, which means that you went home the same day as your procedure, a responsible adult should be with you for the first 24 hours after you arrive home. °· Keep all follow-up visits as directed by your health care provider. This is important. °Contact a health care provider if: °· You have a fever. °· You have chills. °· You have increased bleeding from the radial site. Hold pressure on the site. °Get help right away if: °· You have unusual pain at the radial site. °· You have redness, warmth, or swelling at the radial site. °· You have drainage (other than a small amount of blood on the dressing) from the radial site. °· The radial site is bleeding, and the bleeding does not stop after 30 minutes of holding steady pressure on the site. °· Your arm or hand becomes pale, cool, tingly, or numb. °This information is not intended to replace advice given to you by your health care provider. Make sure you discuss any questions you have with your health care provider. °Document Released: 06/22/2010 Document Revised: 10/26/2015 Document Reviewed: 12/06/2013 °Elsevier Interactive Patient Education © 2018 Elsevier Inc. ° °

## 2017-10-02 NOTE — Progress Notes (Signed)
Asked to go in pt's room because she called out upset.   I went in pt's room to see what she needed and she was trying to take her TR Band off her arm. She had her velcro undone and was pulling at her band. I asked her what she was doing and she said" Im going to take this thing off my wrist.   My nurse said she would take the air out and take it off but she hasn't, "  I explained to her that she cant take her band off because she will bleed.   She stated that her nurse lied to her and told her she was going home at 1315 and that her husband was going to be sitting out there with her dog waiting for her.  I explained that we had to take the air out slowly and that her nurse was just being careful, she then began cursing and yelling.  Patient also stated that she was angry because she couldn't get a signal on her cell phone.  I offered to get our phone for her so she could call her  husband on his cell phone.   I opened her curtain to go out and get the phone and to make sure she wasn't pulling on her TR Band.  Upon entering her room she stated " you're so nosey just look outside and tell my husband I won't be out there until 2.  You pulled my curtain open so you could spy on me"  Larene Beach our nurse secretary was in the room with her trying to calm her down,  I took the phone to her and said " you just need to dial 9 to"  patient interrupted me and screamed " I know how to dial a damn phone.  Get out and do not talk anymore"

## 2017-10-02 NOTE — Telephone Encounter (Signed)
Patient called inquiring about the results of her cath.    She wants to work in her garden this weekend, but not sure of the results and whether she can.  Please advise, thank you.

## 2017-10-02 NOTE — Telephone Encounter (Signed)
Spoke with patient and gave her Dr Darnelle Bos recommendation.  She verbalized understanding.

## 2017-10-02 NOTE — Progress Notes (Signed)
Pt reports taking oral Prednisone and Benadryl as ordered prior to arrival for contrast allergy.

## 2017-10-02 NOTE — Telephone Encounter (Signed)
Good news.  [name] Cardiac Catheterization showed just moderate non-obstructive CAD. Her EF on LV gram was better than the echo that was done last year with Dr. Einar Gip. She does have COPD/emphysema which may be playing a role in her symptoms. She has an echo pending for 5/22.  Let's try to get it scheduled before her follow up with me 10/15/17.   Richardson Dopp, PA-C    10/02/2017 8:15 PM

## 2017-10-02 NOTE — Interval H&P Note (Signed)
History and Physical Interval Note:  10/02/2017 9:43 AM  [name]  has presented today for cardiac catheterization, with the diagnosis of chronic systolic heart failure, coronary artery disease with accelerating angina. The various methods of treatment have been discussed with the patient and family. After consideration of risks, benefits and other options for treatment, the patient has consented to  Procedure(s): RIGHT/LEFT HEART CATH AND CORONARY ANGIOGRAPHY (N/A) as a surgical intervention .  The patient's history has been reviewed, patient examined, no change in status, stable for surgery.  I have reviewed the patient's chart and labs.  Questions were answered to the patient's satisfaction.    Cath Lab Visit (complete for each Cath Lab visit)  Clinical Evaluation Leading to the Procedure:   ACS: No.  Non-ACS:    Anginal Classification: CCS III  Anti-ischemic medical therapy: Maximal Therapy (2 or more classes of medications)  Non-Invasive Test Results: No non-invasive testing performed (LVEF moderately reduced by echo)  Prior CABG: No previous CABG  [name]

## 2017-10-02 NOTE — Telephone Encounter (Signed)
Pt is calling    Pt had surgery today and want to know the prognosis or results of her surgery. Please call pt to advise

## 2017-10-03 ENCOUNTER — Encounter (HOSPITAL_COMMUNITY): Payer: Self-pay | Admitting: Internal Medicine

## 2017-10-03 NOTE — Telephone Encounter (Signed)
ECHO SCHEDULED FOR 10/08/17. I called the pt and advised per PA he would like echo to be done before her appt with him on 10/15/17. Pt is agreeable to move echo appt up to 10/08/17 @ 10:30 am. Pt thanked me for the call.

## 2017-10-06 ENCOUNTER — Telehealth: Payer: Self-pay | Admitting: Physician Assistant

## 2017-10-06 MED FILL — Heparin Sod (Porcine)-NaCl IV Soln 1000 Unit/500ML-0.9%: INTRAVENOUS | Qty: 1000 | Status: AC

## 2017-10-06 NOTE — Telephone Encounter (Signed)
New message  Pt verbalzied that she is calling for RN  To give a diagnoses of her heart condition

## 2017-10-06 NOTE — Telephone Encounter (Signed)
I returned pt's call from earlier, though no answer or VM. I will try tomorrow to reach the pt to try and help answer her question.

## 2017-10-08 ENCOUNTER — Other Ambulatory Visit (HOSPITAL_COMMUNITY): Payer: Self-pay

## 2017-10-08 ENCOUNTER — Encounter (HOSPITAL_COMMUNITY): Payer: Self-pay | Admitting: Internal Medicine

## 2017-10-10 ENCOUNTER — Telehealth: Payer: Self-pay | Admitting: *Deleted

## 2017-10-10 NOTE — Telephone Encounter (Signed)
Spoke with patient who is having chest pain.    I told her to take her nitroglycerin per protocol and if she had no relief, she needed to go to the ED.    She has an Echo scheduled for Monday, 5/13.  I told her to follow up with me when she comes in for the Echo.  She verbalized understanding.

## 2017-10-10 NOTE — Telephone Encounter (Signed)
Patient left a msg on the refill vm stating that the medication that makes her vessels get larger is no longer working and she had chest pains yesterday. She would like to know if she needs a higher dose. Name of the medication was not provided. Call back number left was [phone]. Thanks, MI

## 2017-10-10 NOTE — Telephone Encounter (Signed)
I will route call to Triage for further evaluation. Pt states she had chest pain.

## 2017-10-13 ENCOUNTER — Telehealth: Payer: Self-pay | Admitting: *Deleted

## 2017-10-13 ENCOUNTER — Ambulatory Visit (HOSPITAL_COMMUNITY): Payer: Medicare Other | Attending: Internal Medicine

## 2017-10-13 ENCOUNTER — Encounter: Payer: Self-pay | Admitting: Physician Assistant

## 2017-10-13 ENCOUNTER — Other Ambulatory Visit: Payer: Self-pay

## 2017-10-13 DIAGNOSIS — I5042 Chronic combined systolic (congestive) and diastolic (congestive) heart failure: Secondary | ICD-10-CM

## 2017-10-13 DIAGNOSIS — G459 Transient cerebral ischemic attack, unspecified: Secondary | ICD-10-CM | POA: Insufficient documentation

## 2017-10-13 DIAGNOSIS — I447 Left bundle-branch block, unspecified: Secondary | ICD-10-CM

## 2017-10-13 DIAGNOSIS — I25119 Atherosclerotic heart disease of native coronary artery with unspecified angina pectoris: Secondary | ICD-10-CM | POA: Diagnosis not present

## 2017-10-13 DIAGNOSIS — I700 Atherosclerosis of aorta: Secondary | ICD-10-CM

## 2017-10-13 DIAGNOSIS — E785 Hyperlipidemia, unspecified: Secondary | ICD-10-CM

## 2017-10-13 DIAGNOSIS — J439 Emphysema, unspecified: Secondary | ICD-10-CM

## 2017-10-13 DIAGNOSIS — Z8249 Family history of ischemic heart disease and other diseases of the circulatory system: Secondary | ICD-10-CM | POA: Insufficient documentation

## 2017-10-13 DIAGNOSIS — I252 Old myocardial infarction: Secondary | ICD-10-CM | POA: Insufficient documentation

## 2017-10-13 NOTE — Telephone Encounter (Signed)
Left message to go over echo results.

## 2017-10-13 NOTE — Telephone Encounter (Signed)
-----   Message from Liliane Shi, [place] sent at 10/13/2017  4:46 PM EDT ----- Please call the patient. The echocardiogram demonstrates normal heart function and no significant valve abnormalities.  There is mildly impaired relaxation.  Since her last echocardiogram, her heart function has improved to normal. Continue current medications and follow up as planned.  Please fax a copy to PCP:  Donald Prose, MD  Richardson Dopp, PA-C    10/13/2017 4:43 PM

## 2017-10-15 ENCOUNTER — Encounter: Payer: Self-pay | Admitting: Physician Assistant

## 2017-10-15 ENCOUNTER — Ambulatory Visit: Payer: Medicare Other | Admitting: Physician Assistant

## 2017-10-15 VITALS — BP 140/88 | HR 84 | Ht 61.0 in | Wt 117.0 lb

## 2017-10-15 DIAGNOSIS — I447 Left bundle-branch block, unspecified: Secondary | ICD-10-CM | POA: Diagnosis not present

## 2017-10-15 DIAGNOSIS — I5042 Chronic combined systolic (congestive) and diastolic (congestive) heart failure: Secondary | ICD-10-CM

## 2017-10-15 DIAGNOSIS — E785 Hyperlipidemia, unspecified: Secondary | ICD-10-CM | POA: Diagnosis not present

## 2017-10-15 DIAGNOSIS — J439 Emphysema, unspecified: Secondary | ICD-10-CM

## 2017-10-15 DIAGNOSIS — I25119 Atherosclerotic heart disease of native coronary artery with unspecified angina pectoris: Secondary | ICD-10-CM

## 2017-10-15 MED ORDER — ISOSORBIDE MONONITRATE ER 60 MG PO TB24: 90.0000 mg | ORAL_TABLET | Freq: Every day | ORAL | 3 refills | Status: DC

## 2017-10-15 NOTE — Patient Instructions (Signed)
Medication Instructions:  1. INCREASE IMDUR TO 90 MG DAILY; (THIS WILL BE 1 AND 1/2 TABS DAILY OF THE 60 MG TABLET)  Labwork: IN 2 WEEKS FASTING LIPID AND LIVER PANEL; NOTHING TO EAT AFTER MIDNIGHT THE NIGHT BEFORE LAB WORK, MORNING MEDICATIONS WITH WATER IS FINE  Testing/Procedures: NONE ORDERED TODAY  Follow-Up: Your physician wants you to follow-up in: [place] [address] END  You will receive a reminder letter in the mail two months in advance. If you don't receive a letter, please call our office to schedule the follow-up appointment.   Any Other Special Instructions Will Be Listed Below (If Applicable).     If you need a refill on your cardiac medications before your next appointment, please call your pharmacy.

## 2017-10-15 NOTE — Telephone Encounter (Signed)
-----   Message from Liliane Shi, [place] sent at 10/13/2017  4:46 PM EDT ----- Please call the patient. The echocardiogram demonstrates normal heart function and no significant valve abnormalities.  There is mildly impaired relaxation.  Since her last echocardiogram, her heart function has improved to normal. Continue current medications and follow up as planned.  Please fax a copy to PCP:  Donald Prose, MD  Richardson Dopp, PA-C    10/13/2017 4:43 PM

## 2017-10-15 NOTE — Telephone Encounter (Signed)
Pt has been notified of echo results by phone with verbal understanding. Pt thanked me for the call. I will forward a copy of results to PCP Dr. Donald Prose

## 2017-10-15 NOTE — Progress Notes (Signed)
Cardiology Office Note:    Date:  10/15/2017   ID:  Audrey PEYSER, DOB [birth year], MRN [id number]  PCP:  Donald Prose, MD  Cardiologist:  I will est her with Dr. Saunders Revel  Pulmonologist:  Brand Males, MD Neurologist:  Metta Clines, DO   Referring MD: Donald Prose, MD   Chief Complaint  Patient presents with  . Hospitalization Follow-up    Status post cardiac catheterization    History of Present Illness:    [name] is a 72 y.o. female with coronary artery disease, combined systolic and diastolic congestive heart failure, emphysema, LBBB, hyperlipidemia (intol of statins).  She was previously followed by Dr. Einar [name].  Cardiac catheterization in 2012 demonstrated high-grade OM1 disease which was treated medically.  An echocardiogram in 2018 demonstrated reduced LV function with an EF of 35-40%.  Nuclear stress test in August 2018 was negative for ischemia.  She had worsening chest discomfort and shortness of breath with improved anginal symptoms on nitrate therapy.  However, with continued shortness of breath, cardiac catheterization was planned.  However, she decided to transfer her care to our practice.  I saw her 09/29/2017.  Right and left heart catheterization was arranged.  This demonstrated mild to moderate nonobstructive coronary disease in the proximal/mid LAD and OM1.  This appeared similar to the previous catheterization in 2012.  Left and right heart filling pressures were upper normal range.  Continued medical therapy was recommended.  [name] returns for follow-up.  She is here with her nephew.  She continues to note exertional chest discomfort.  She takes nitroglycerin with relief.  She has chronic dyspnea without change.  She denies syncope.  She sleeps on an incline.  She has noted some occasional edema.  Prior CV studies:   The following studies were reviewed today:  Echo 10/13/2017 EF 50-55, normal wall motion, grade 1 diastolic dysfunction, paradoxical septal  motion, trivial TR  R/L cardiac catheterization 10/02/2017 LM normal LAD proximal 40 OM1 ostial 60 EF 45-50 Mean RA 7, RV 37/8; mean PA 17; mean PCWP 15; CO 4.5, CI 2.9  High-resolution chest CT 02/10/2017 IMPRESSION: 1. No findings to explain the patient's given symptoms. 2. Aortic atherosclerosis (ICD10-170.0). Coronary artery calcification. 3. Emphysema (ICD10-J43.9).  Nuclear stress test 01/20/2017 Soft tissue attenuation artifact (inferoseptal, apical septal defect), EF 40, consistent with nonischemic cardiomyopathy, no ischemia  Echo 12/31/2016 Moderate concentric LVH, grade 1 diastolic dysfunction, abnormal septal wall motion due to LBBB, moderate diffuse HK, EF 35-40, atrial septal aneurysm with possible PFO, mild TR  Echo 05/09/15 Abnormal septal motion, EF 50-55, mild LAE  Carotid US 05/09/15 bilat ICA 1-39  Cardiac catheterization 04/01/2011 EF 55, mid to distal anterolateral hypokinesis RCA normal LM normal LAD luminal irregularities LCx normal; OM1 80-90 (difficult to visualize) Treated medically  Past Medical History:  Diagnosis Date  . Anxiety   . Aortic atherosclerosis ([place]) 09/29/2017   High Res CT in 9/18: aortic atherosclerosis; coronary artery calcification  . Cervical disc disease    BULGING  . Chronic combined systolic and diastolic CHF (congestive heart failure) ([place]) 09/29/2017   Echo 7/18: Moderate concentric LVH, grade 1 diastolic dysfunction, abnormal septal wall motion due to LBBB, moderate diffuse HK, EF 35-40, atrial septal aneurysm with possible PFO, mild TR // Echo 5/19: EF 50-55, normal wall motion, grade 1 diastolic dysfunction, trivial TR  . Complication of anesthesia    SLOW TO AWAKEN AFTER LAST COLONSCOPY  . Coronary artery disease 05/09/2015   LHC 10/12: EF  55, OM1 80-90 - difficult to engage >> treated medically // Nuc 8/18: EF 40, no ischemia  . Depression   . DJD (degenerative joint disease)    OA  . Dysrhythmia    Not clear  where this diagnosis came from  . Emphysema of lung ([place])    Dr. [place] Caller  . Fibromyalgia   . H/O: liver disease 97 YRS AGO   tranamanitis due to previous interferon - LFTs improved off of  . History of MI (myocardial infarction) FEW YRS AGO  . History of TIA (transient ischemic attack)   . Hx of colonic polyps   . Hyperlipidemia    intol to statin - myalgias // ESPERION trial - patient stopped  . Multiple sclerosis ([place])   . Narcotic dependence (HCC)    hx of benzodiazepine and narcotic  . Osteoporosis   . Positive H. pylori test   . Urinary incontinence    Surgical Hx: The patient  has a past surgical history that includes Tubal ligation; Esophageal manometry (N/A, 10/23/2015); Back surgery (11-2009); COLONSCOPY; Esophagogastroduodenoscopy (egd) with propofol (N/A, 03/21/2016); Botox injection (N/A, 03/21/2016); Cardiac catheterization; and RIGHT/LEFT HEART CATH AND CORONARY ANGIOGRAPHY (N/A, 10/02/2017).   Current Medications: Current Meds  Medication Sig  . albuterol (PROAIR HFA) 108 (90 BASE) MCG/ACT inhaler Inhale 2 puffs into the lungs every 6 (six) hours as needed for wheezing or shortness of breath.   . Ascorbic Acid (VITAMIN C) 1000 MG tablet Take 1,000 mg by mouth daily.  [name] aspirin EC 81 MG tablet Take 162 mg by mouth daily.  . Cholecalciferol (VITAMIN D3 PO) Take 1 capsule by mouth daily.   . clonazePAM (KLONOPIN) 1 MG tablet Take 1 mg by mouth as directed.  [name] COENZYME Q10 PO Take 1 capsule by mouth 2 (two) times daily.   . diphenhydrAMINE (BENADRYL) 50 MG capsule Take 50 mg by mouth as directed.  . doxepin (SINEQUAN) 25 MG capsule Take 1 capsule (25 mg total) by mouth at bedtime.  [name] EVENING PRIMROSE OIL PO Take 2 capsules by mouth 2 (two) times daily.  [name] gabapentin (NEURONTIN) 100 MG capsule Take 100-300 mg by mouth See admin instructions. Take 100 mg by mouth daily and take 300 mg by mouth at bedtime  . GLATOPA 20 MG/ML SOSY injection USE 1 INJECTION SUBCUTANEOUSLY ONCE A  DAY  . guaiFENesin (MUCINEX) 600 MG 12 hr tablet Take 600 mg by mouth 2 (two) times daily as needed for cough or to loosen phlegm.   . Homeopathic Products (SIMILASAN DRY EYE RELIEF OP) Place 1 drop into both eyes 2 (two) times daily.  [name] ipratropium (ATROVENT) 0.02 % nebulizer solution Take 2.5 mLs (0.5 mg total) by nebulization 4 (four) times daily.  [name] LINZESS 290 MCG CAPS capsule Take 290 mcg by mouth daily before breakfast.   . losartan (COZAAR) 25 MG tablet Take 25 mg by mouth 2 (two) times daily.   [name] lovastatin (MEVACOR) 20 MG tablet Take 20 mg by mouth at bedtime.   . metoprolol succinate (TOPROL-XL) 25 MG 24 hr tablet Take 25 mg by mouth daily.   . modafinil (PROVIGIL) 200 MG tablet TAKE 1 TABLET BY MOUTH TWICE A DAY  . Multiple Vitamin (MULTIVITAMIN WITH MINERALS) TABS tablet Take 1 tablet by mouth daily.  . nitroGLYCERIN (NITROSTAT) 0.4 MG SL tablet Place 0.4 mg under the tongue every 5 (five) minutes as needed for chest pain.  Gean Birchwood ER 50 MG TB12 Take 50 mg by mouth every 12 (twelve)  hours.   . Omega-3 Fatty Acids (OMEGA 3 PO) Take 1 capsule by mouth daily.  [name] oxyCODONE-acetaminophen (PERCOCET) 5-325 MG per tablet Take 1 tablet by mouth every 4 (four) hours as needed for severe pain.   . polyethylene glycol powder (GLYCOLAX/MIRALAX) powder Take 17 g by mouth daily as needed for moderate constipation.   . predniSONE (DELTASONE) 50 MG tablet TAKE 1 TABLET 12 HOURS PRIOR TO YOUR CATH TAKE 1 TABLET 6 HOURS PRIOR TO YOUR CATH TAKE THE LAST TABLET (ALONG WITH 1 BENADRYL 50 MG) AS YOU ARE ARRIVING TO THE HOSPITAL  . PRISTIQ 50 MG 24 hr tablet Take 50 mg by mouth at bedtime.   . ranitidine (ZANTAC) 300 MG tablet Take 300 mg by mouth at bedtime.  [name] umeclidinium bromide (INCRUSE ELLIPTA) 62.5 MCG/INH AEPB Inhale 1 puff into the lungs daily.  . vitamin B-12 (CYANOCOBALAMIN) 1000 MCG tablet Take 1,500 mcg by mouth daily.  . [DISCONTINUED] isosorbide mononitrate (IMDUR) 60 MG 24 hr tablet Take  1 tablet (60 mg total) by mouth daily.     Allergies:   Crestor [rosuvastatin]; Sulfonamide derivatives; and Contrast media [iodinated diagnostic agents]   Social History   Tobacco Use  . Smoking status: Former Smoker    Packs/day: 1.00    Years: 48.00    Pack years: 48.00    Types: Cigarettes    Last attempt to quit: 11/16/2009    Years since quitting: 7.9  . Smokeless tobacco: Never Used  Substance Use Topics  . Alcohol use: No  . Drug use: No     Family Hx: The patient's family history includes Cancer in her brother; Heart attack (age of onset: 52) in her father; Heart attack (age of onset: 51) in her mother.  ROS:   Please see the history of present illness.    ROS All other systems reviewed and are negative.   EKGs/Labs/Other Test Reviewed:    EKG:  EKG is  ordered today.  The ekg ordered today demonstrates NSR, HR 76, LBBB  Recent Labs: 09/29/2017: BUN 12; Creatinine, Ser 0.81; Hemoglobin 11.9; NT-Pro BNP 305; Platelets 335; Potassium 4.1; Sodium 135   Recent Lipid Panel Lab Results  Component Value Date/Time   CHOL 201 (H) 05/09/2015 05:55 AM   TRIG 104 05/09/2015 05:55 AM   HDL 53 05/09/2015 05:55 AM   CHOLHDL 3.8 05/09/2015 05:55 AM   LDLCALC 127 (H) 05/09/2015 05:55 AM    Physical Exam:    VS:  BP 140/88   Pulse 84   Ht 5\' 1"  (1.549 m)   Wt 117 lb (53.1 kg)   SpO2 96%   BMI 22.11 kg/m     Wt Readings from Last 3 Encounters:  10/15/17 117 lb (53.1 kg)  10/02/17 121 lb (54.9 kg)  09/29/17 121 lb (54.9 kg)     Physical Exam  Constitutional: She is oriented to person, place, and time. She appears well-developed and well-nourished. No distress.  HENT:  Head: Normocephalic and atraumatic.  Neck: Neck supple. No JVD present.  Cardiovascular: Normal rate, regular rhythm, S1 normal, S2 normal and normal heart sounds.  No murmur heard. Pulmonary/Chest: Effort normal. She has no rales.  Abdominal: Soft. There is no hepatomegaly.  Musculoskeletal:  She exhibits no edema.  R wrist without hematoma  Neurological: She is alert and oriented to person, place, and time.  Skin: Skin is warm and dry.    ASSESSMENT & PLAN:    Coronary artery disease involving native coronary artery of  native heart with angina pectoris [facility]) Recent cardiac catheterization with moderate nonobstructive coronary artery disease in the LAD and OM1.  EF is low normal by left ventriculogram.  EF by echocardiogram is normal.  She continues to have classic anginal symptoms.  Question if she has microvascular angina.  Of note, she did tell me that she was told in the cardiac catheterization lab that she has terrible coronary disease and that she only had 2 years to live.  We did discuss the results of her cardiac catheterization today and I explained to her that she only has moderate disease without significant obstruction.  As her symptoms are responsive to nitroglycerin, I have suggested that we further titrate her isosorbide.  -Continue aspirin, beta-blocker, statin  -Increase isosorbide to 90 mg daily  Chronic combined systolic and diastolic CHF (congestive heart failure) (HCC) EF was previously 35-40 by echocardiogram in 2018.  Recent echocardiogram demonstrates normal LV function.  Continue beta-blocker, ARB.  Status appears stable.  Pulmonary emphysema, unspecified emphysema type ([place]) Continue follow-up with pulmonology.  Hyperlipidemia, unspecified hyperlipidemia type She has had intolerance to statins in the past.  She tells me that she has been able to tolerate Zetia in the past.  Arrange follow-up lipids and LFTs.  If above target, consider adding Zetia versus referral to the lipid clinic for consideration of PCSK9 inhibitors.  LBBB (left bundle branch block) I reviewed her case previously with Dr. Caryl Comes.  If her EF remained low and there was no obstructive coronary disease, we thought she may benefit from consideration of CRT.  However, I explained to the  patient today that with normal ejection fraction, she does not meet criteria for CRT.   Dispo:  Return in about 6 months (around 04/17/2018) for Routine Follow Up, w/ Dr. Saunders Revel.   Medication Adjustments/Labs and Tests Ordered: Current medicines are reviewed at length with the patient today.  Concerns regarding medicines are outlined above.  Tests Ordered: Orders Placed This Encounter  Procedures  . Lipid Profile  . Hepatic function panel  . EKG 12-Lead   Medication Changes: Meds ordered this encounter  Medications  . isosorbide mononitrate (IMDUR) 60 MG 24 hr tablet    Sig: Take 1.5 tablets (90 mg total) by mouth daily.    Dispense:  135 tablet    Refill:  3    DOSE INCREASE    Signed, Richardson Dopp, PA-C  10/15/2017 12:01 PM    [facility] [address], [place], [place]  [address] Phone: [phone]; Fax: [phone]

## 2017-10-21 ENCOUNTER — Other Ambulatory Visit: Payer: Self-pay

## 2017-10-22 ENCOUNTER — Other Ambulatory Visit (HOSPITAL_COMMUNITY): Payer: Self-pay

## 2017-10-29 ENCOUNTER — Other Ambulatory Visit: Payer: Self-pay

## 2017-11-04 ENCOUNTER — Other Ambulatory Visit: Payer: Self-pay | Admitting: Neurology

## 2017-11-08 ENCOUNTER — Encounter: Payer: Self-pay | Admitting: Internal Medicine

## 2017-11-08 ENCOUNTER — Encounter: Payer: Self-pay | Admitting: Physician Assistant

## 2017-11-10 NOTE — Telephone Encounter (Signed)
Richardson Dopp, [place] will speak with the pt in regards to her question.

## 2017-11-26 ENCOUNTER — Ambulatory Visit: Payer: Self-pay | Admitting: Adult Health

## 2017-12-01 ENCOUNTER — Ambulatory Visit (INDEPENDENT_AMBULATORY_CARE_PROVIDER_SITE_OTHER): Payer: Medicare Other | Admitting: Internal Medicine

## 2017-12-01 ENCOUNTER — Encounter: Payer: Self-pay | Admitting: Adult Health

## 2017-12-01 ENCOUNTER — Ambulatory Visit: Payer: Medicare Other | Admitting: Adult Health

## 2017-12-01 DIAGNOSIS — J439 Emphysema, unspecified: Secondary | ICD-10-CM | POA: Diagnosis not present

## 2017-12-01 DIAGNOSIS — J438 Other emphysema: Secondary | ICD-10-CM

## 2017-12-01 LAB — PULMONARY FUNCTION TEST
DL/VA % PRED: 82 %
DL/VA: 3.5 ml/min/mmHg/L
DLCO unc % pred: 87 %
DLCO unc: 16.51 ml/min/mmHg
FEF 25-75 Pre: 1.79 L/sec
FEF2575-%PRED-PRE: 115 %
FEV1-%PRED-PRE: 109 %
FEV1-Pre: 1.98 L
FEV1FVC-%PRED-PRE: 108 %
FEV6-%Pred-Pre: 104 %
FEV6-Pre: 2.41 L
FEV6FVC-%Pred-Pre: 104 %
FVC-%Pred-Pre: 100 %
FVC-Pre: 2.43 L
PRE FEV6/FVC RATIO: 99 %
Pre FEV1/FVC ratio: 82 %

## 2017-12-01 NOTE — Progress Notes (Signed)
Patient had spiro and DLCO only today.

## 2017-12-01 NOTE — Patient Instructions (Signed)
So glad you are feeling better  Continue on Atrovent/Ipratropium Neb Twice daily  .  Follow up with Dr. Chase Caller in 6 months and As needed

## 2017-12-01 NOTE — Progress Notes (Signed)
@Patient  ID: [name], female    DOB: 08/08/1945, 72 y.o.   MRN: [id number]  Chief Complaint  Patient presents with  . Follow-up    Emphysema     Referring provider: Donald Prose, MD  HPI: 72 year old female former smoker followed for emphysema, and lung nodule  TEST  High-resolution CT chest September 2018 showed 16 mm left upper lobe nodule unchanged dating back to September 2014 consistent with benign etiology, mild emphysema   12/01/2017 Follow up : Emphysema  Patient returns for a 78-month follow-up.  Patient was seen last visit.  She was started on ipratropium nebulizers twice daily.  Says that this has really helped her breathing.  She feels less short of breath.  She has been able to be more active. Feels that this is the best she is done in a long time.  She is actually able to get out and do things.  She is very happy with this.  She is now able to walk her dog. PFT today showed normal lung function with FEV1 at 109%, ratio 82, FVC 100%, DLCO 87%.    Allergies  Allergen Reactions  . Crestor [Rosuvastatin] Hives and Other (See Comments)    welps  . Sulfonamide Derivatives Hives    As a child  . Contrast Media [Iodinated Diagnostic Agents] Rash and Other (See Comments)    Severe rash    Immunization History  Administered Date(s) Administered  . Influenza Split 02/20/2011, 02/20/2012, 03/03/2014  . Influenza Whole 02/17/2017  . Influenza,inj,Quad PF,6+ Mos 02/19/2013  . Pneumococcal Conjugate-13 02/05/2017  . Pneumococcal Polysaccharide-23 03/03/2014  . Td 06/03/2002    Past Medical History:  Diagnosis Date  . Anxiety   . Aortic atherosclerosis ([place]) 09/29/2017   High Res CT in 9/18: aortic atherosclerosis; coronary artery calcification  . Cervical disc disease    BULGING  . Chronic combined systolic and diastolic CHF (congestive heart failure) ([place]) 09/29/2017   Echo 7/18: Moderate concentric LVH, grade 1 diastolic dysfunction, abnormal septal wall  motion due to LBBB, moderate diffuse HK, EF 35-40, atrial septal aneurysm with possible PFO, mild TR // Echo 5/19: EF 50-55, normal wall motion, grade 1 diastolic dysfunction, trivial TR  . Complication of anesthesia    SLOW TO AWAKEN AFTER LAST COLONSCOPY  . Coronary artery disease 05/09/2015   LHC 10/12: EF 55, OM1 80-90 - difficult to engage >> treated medically // Nuc 8/18: EF 40, no ischemia  . Depression   . DJD (degenerative joint disease)    OA  . Dysrhythmia    Not clear where this diagnosis came from  . Emphysema of lung ([place])    Dr. [place] Caller  . Fibromyalgia   . H/O: liver disease 74 YRS AGO   tranamanitis due to previous interferon - LFTs improved off of  . History of MI (myocardial infarction) FEW YRS AGO  . History of TIA (transient ischemic attack)   . Hx of colonic polyps   . Hyperlipidemia    intol to statin - myalgias // ESPERION trial - patient stopped  . Multiple sclerosis ([place])   . Narcotic dependence (HCC)    hx of benzodiazepine and narcotic  . Osteoporosis   . Positive H. pylori test   . Urinary incontinence     Tobacco History: Social History   Tobacco Use  Smoking Status Former Smoker  . Packs/day: 1.00  . Years: 48.00  . Pack years: 48.00  . Types: Cigarettes  . Last attempt to quit: 11/16/2009  .  Years since quitting: 8.0  Smokeless Tobacco Never Used   Counseling given: Not Answered   Outpatient Encounter Medications as of 12/01/2017  Medication Sig  . Ascorbic Acid (VITAMIN C) 1000 MG tablet Take 1,000 mg by mouth daily.  [name] aspirin EC 81 MG tablet Take 162 mg by mouth daily.  . Cholecalciferol (VITAMIN D3 PO) Take 1 capsule by mouth daily.   . clonazePAM (KLONOPIN) 1 MG tablet Take 1 mg by mouth as directed.  [name] COENZYME Q10 PO Take 1 capsule by mouth 2 (two) times daily.   [name] doxepin (SINEQUAN) 25 MG capsule Take 1 capsule (25 mg total) by mouth at bedtime.  [name] EVENING PRIMROSE OIL PO Take 2 capsules by mouth 2 (two) times daily.  [name]  gabapentin (NEURONTIN) 100 MG capsule Take 100-300 mg by mouth See admin instructions. Take 100 mg by mouth daily and take 300 mg by mouth at bedtime  . Homeopathic Products (SIMILASAN DRY EYE RELIEF OP) Place 1 drop into both eyes 2 (two) times daily.  [name] ipratropium (ATROVENT) 0.02 % nebulizer solution Take 2.5 mLs (0.5 mg total) by nebulization 4 (four) times daily.  . isosorbide mononitrate (IMDUR) 60 MG 24 hr tablet Take 1.5 tablets (90 mg total) by mouth daily.  [name] LINZESS 290 MCG CAPS capsule Take 290 mcg by mouth daily before breakfast.   . losartan (COZAAR) 25 MG tablet Take 25 mg by mouth 2 (two) times daily.   [name] lovastatin (MEVACOR) 20 MG tablet Take 20 mg by mouth at bedtime.   . metoprolol succinate (TOPROL-XL) 25 MG 24 hr tablet Take 25 mg by mouth daily.   . modafinil (PROVIGIL) 200 MG tablet TAKE 1 TABLET BY MOUTH TWICE A DAY  . Multiple Vitamin (MULTIVITAMIN WITH MINERALS) TABS tablet Take 1 tablet by mouth daily.  . nitroGLYCERIN (NITROSTAT) 0.4 MG SL tablet Place 0.4 mg under the tongue every 5 (five) minutes as needed for chest pain.  Gean Birchwood ER 50 MG TB12 Take 50 mg by mouth every 12 (twelve) hours.   . Omega-3 Fatty Acids (OMEGA 3 PO) Take 1 capsule by mouth daily.  [name] oxyCODONE-acetaminophen (PERCOCET) 5-325 MG per tablet Take 1 tablet by mouth every 4 (four) hours as needed for severe pain.   . polyethylene glycol powder (GLYCOLAX/MIRALAX) powder Take 17 g by mouth daily as needed for moderate constipation.   [name] PRISTIQ 50 MG 24 hr tablet Take 50 mg by mouth at bedtime.   . ranitidine (ZANTAC) 300 MG tablet Take 300 mg by mouth at bedtime.  . vitamin B-12 (CYANOCOBALAMIN) 1000 MCG tablet Take 1,500 mcg by mouth daily.  [name] albuterol (PROAIR HFA) 108 (90 BASE) MCG/ACT inhaler Inhale 2 puffs into the lungs every 6 (six) hours as needed for wheezing or shortness of breath.   . diphenhydrAMINE (BENADRYL) 50 MG capsule Take 50 mg by mouth as directed.  [name] glatiramer (COPAXONE) 20  MG/ML SOSY injection USE 1 INJECTION SUBCUTANEOUSLY ONCE A DAY (Patient not taking: Reported on 12/01/2017)  . guaiFENesin (MUCINEX) 600 MG 12 hr tablet Take 600 mg by mouth 2 (two) times daily as needed for cough or to loosen phlegm.   . umeclidinium bromide (INCRUSE ELLIPTA) 62.5 MCG/INH AEPB Inhale 1 puff into the lungs daily. (Patient not taking: Reported on 12/01/2017)  . [DISCONTINUED] predniSONE (DELTASONE) 50 MG tablet TAKE 1 TABLET 12 HOURS PRIOR TO YOUR CATH TAKE 1 TABLET 6 HOURS PRIOR TO YOUR CATH TAKE THE LAST TABLET (ALONG WITH 1 BENADRYL 50  MG) AS YOU ARE ARRIVING TO [place] (Patient not taking: Reported on 12/01/2017)   No facility-administered encounter medications on file as of 12/01/2017.      Review of Systems  Constitutional:   No  weight loss, night sweats,  Fevers, chills,  +fatigue, or  lassitude.  HEENT:   No headaches,  Difficulty swallowing,  Tooth/dental problems, or  Sore throat,                No sneezing, itching, ear ache, nasal congestion, post nasal drip,   CV:  No chest pain,  Orthopnea, PND, swelling in lower extremities, anasarca, dizziness, palpitations, syncope.   GI  No heartburn, indigestion, abdominal pain, nausea, vomiting, diarrhea, change in bowel habits, loss of appetite, bloody stools.   Resp:  No chest wall deformity  Skin: no rash or lesions.  GU: no dysuria, change in color of urine, no urgency or frequency.  No flank pain, no hematuria   MS:  No joint pain or swelling.  No decreased range of motion.  No back pain.    Physical Exam  BP 104/64 (BP Location: Left Arm, Cuff Size: Normal)   Pulse 65   Ht 5' (1.524 m)   Wt 121 lb (54.9 kg)   SpO2 94%   BMI 23.63 kg/m   GEN: A/Ox3; pleasant , NAD, thin and elderly   HEENT:  [place]/AT,  EACs-clear, TMs-wnl, NOSE-clear, THROAT-clear, no lesions, no postnasal drip or exudate noted.   NECK:  Supple w/ fair ROM; no JVD; normal carotid impulses w/o bruits; no thyromegaly or nodules  palpated; no lymphadenopathy.    RESP  Clear  P & A; w/o, wheezes/ rales/ or rhonchi. no accessory muscle use, no dullness to percussion  CARD:  RRR, no m/r/g, no peripheral edema, pulses intact, no cyanosis or clubbing.  GI:   Soft & nt; nml bowel sounds; no organomegaly or masses detected.   Musco: Warm bil, no deformities or joint swelling noted.   Neuro: alert, no focal deficits noted.    Skin: Warm, no lesions or rashes    Lab Results:  CBC  BMET  BNP No results found for: BNP  ProBNP  Imaging: No results found.   Assessment & Plan:   COPD (chronic obstructive pulmonary disease) ([place]) Emphysema on CT chest.  PFTs today show no evidence of airflow obstruction or restriction.  Patient has symptomatic improvement on ipratropium nebs.  We will continue current regimen  Plan  Patient Instructions  So glad you are feeling better  Continue on Atrovent/Ipratropium Neb Twice daily  .  Follow up with Dr. Chase Caller in 6 months and As needed           Rexene Edison, NP 12/01/2017

## 2017-12-01 NOTE — Assessment & Plan Note (Signed)
Emphysema on CT chest.  PFTs today show no evidence of airflow obstruction or restriction.  Patient has symptomatic improvement on ipratropium nebs.  We will continue current regimen  Plan  Patient Instructions  So glad you are feeling better  Continue on Atrovent/Ipratropium Neb Twice daily  .  Follow up with Dr. Chase Caller in 6 months and As needed

## 2017-12-12 ENCOUNTER — Other Ambulatory Visit: Payer: Self-pay | Admitting: Family Medicine

## 2017-12-12 ENCOUNTER — Ambulatory Visit
Admission: RE | Admit: 2017-12-12 | Discharge: 2017-12-12 | Disposition: A | Discharge status: Home / Self Care | Payer: Medicare Other | Source: Ambulatory Visit | Attending: Family Medicine | Admitting: Family Medicine

## 2017-12-12 DIAGNOSIS — R05 Cough: Secondary | ICD-10-CM

## 2017-12-12 DIAGNOSIS — R059 Cough, unspecified: Secondary | ICD-10-CM

## 2017-12-30 ENCOUNTER — Telehealth: Payer: Self-pay | Admitting: Neurology

## 2017-12-30 NOTE — Telephone Encounter (Signed)
Patient called regarding her Generic Provigil Medication needing Authorization. Please Call. Thanks

## 2017-12-30 NOTE — Telephone Encounter (Signed)
Request Reference Number: [id number]. MODAFINIL TAB 200MG  is approved through 07/02/2018. For further questions, call [phone].

## 2017-12-30 NOTE — Telephone Encounter (Signed)
Received prior auth request for Modafinil. Submitted via covermymeds.

## 2018-01-19 ENCOUNTER — Telehealth: Payer: Self-pay | Admitting: Internal Medicine

## 2018-01-19 NOTE — Telephone Encounter (Signed)
lmtcb for pt.  

## 2018-01-19 NOTE — Telephone Encounter (Signed)
Called and spoke to pt.  Pt had questions regarding Atrovent usage. She stated Rx states qid.  Per TP instructions at 12/01/17 OV, pt was to continue Atrovent/Ipratropium BID. Pt stated that she has been doing Atrovent TID with improvement in breathing.  Pt states she has been able to get up and clean her house since increases usage. pt is requesting to change usage of Atrovent to TID.  MR please advise. Thanks     Patient Instructions  So glad you are feeling better  Continue on Atrovent/Ipratropium Neb Twice daily  .  Follow up with Dr. Chase Caller in 6 months and As needed

## 2018-01-19 NOTE — Telephone Encounter (Signed)
TID is fine but ok to use it QID too

## 2018-01-20 NOTE — Telephone Encounter (Signed)
Patient returned call, CB is [phone].

## 2018-01-20 NOTE — Telephone Encounter (Signed)
Spoke with pt. She is aware of MR's response. Nothing further was needed. 

## 2018-01-29 ENCOUNTER — Other Ambulatory Visit: Payer: Self-pay | Admitting: Neurology

## 2018-01-30 ENCOUNTER — Other Ambulatory Visit: Payer: Self-pay | Admitting: Neurology

## 2018-01-30 NOTE — Telephone Encounter (Signed)
Patient left a VM to make sure we got request from the Drug store about her medication. Please call patient

## 2018-01-30 NOTE — Telephone Encounter (Signed)
Called and advised Pt I faxed refill to [facility]

## 2018-02-03 NOTE — Telephone Encounter (Signed)
This question has been addressed via MyChart message in the past.  She has seen a cardiologist at [facility] since that time for evaluation.  Dr. Saunders Revel and I have both answered her questions regarding her heart disease and her prognosis.  If she has ongoing questions or concerns, she will need to schedule an appointment with Dr. Saunders Revel or me or her cardiologist at [facility] to discuss further. Richardson Dopp, PA-C    02/03/2018 3:40 PM

## 2018-02-03 NOTE — Telephone Encounter (Signed)
I s/w the pt in regards to her MY CHART message today to [name], [place]. I have scheduled her an appt to see [name], [facility] 02/25/18 @ 11:15. Pt thanked me for the call and the appt.

## 2018-02-10 NOTE — Progress Notes (Signed)
NEUROLOGY FOLLOW UP OFFICE NOTE  LAPARIS DURRETT [id number]  HISTORY OF PRESENT ILLNESS: [name] is a 72 year old right-handed female with CAD, COPD, thyroid disease, IBS, arthritis, depression, fibromyalgia who follows up for MS.  UPDATE: Current medications: Copaxone 20mg  [place] daily Gabapentin 100mg  at bedtime (300mg  causes drowsiness) Clonazepam 1mg  during day and 2mg  at bedtime for spasms D3 4000 IU daily Provigil 200mg   Vision:  Permanent vision loss in left eye Motor:  Stable.  Intermittent head shaking Sensory:  stable Pain:  Pain in arms and legs.  Lumbar radiculopathy Gait:  Sometimes requires ambulation with cane or walker.   Bowel/Bladder:  Stable Fatigue:  Yes Cognition:  Short-term memory deficits. Mood:  Depressed. She reports difficulty with speech.  She has trouble with pronouncing them.  It has been ongoing for several months but has gotten worse.    HISTORY: First symptom occurred in 1995, when she developed vision loss in the left eye, as well as left eye pain. MRI of the brain with and without contrast was performed on 03/28/95 revealed 10-12 periventricular white matter lesions and one lesion in the corpus callosum, suspicious for demyelination. There was also enhancement of the left optic nerve. She was referred to Dr. Delphia Grates, an [facility] specialist. Lumbar puncture was performed, revealing no oligoclonal bands or myelin basic protein. She has not required hospitalization or steroids since the late 1990s. She was on Betaseron and later Copaxone. Copaxone was discontinued due to longstanding stability, but due to fatigue and cognitive issues, it was restarted in 2014. Her continued symptoms are episodes of worsening fatigue, as well as cognitive issues. Sometimes, she has difficulty thinking clearly. She will get disoriented while driving. Other times, she has word-finding difficulties. She once overpaid a bill. She still performs all ADLs,  including paying the bills. 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 Sometimes, when she feels symptoms are worse, she sees a white "half a doughnut" shape in her visual field.   Past disease-modifying agents: Betaseron (elevated LFTs and necrotic injection sites)  For fatigue, she takes Provigil. Adderal was ineffective. Amantadine was ineffective. For pain in the arms and legs, she takes gabapentin 300mg  at bedtime. She reportedly has lumbar radiculopathy which also contributes to the leg pain. Initially, she reportedly had significant spasticity. She was on oral baclofen which did not help. At one point, a baclofen pump was entertained. She currently takes clonazepam 1mg  during the day and 2mg  at bedtime, which is effective.  Other imaging: 05/07/95 MRI LUMBAR WO: mild disc bulging at L4-5 without disc herniation or central canal stenosis. 03/09/96 MRI BRAIN W/WO (comparing to MRI from 03/28/95): slight increase in number of white matter lesions adjacent to the atrium of the right lateral ventricle, otherwise unchanged. 03/28/98 MRI BRAIN W/WO (comparing to MRI from 03/25/97): several subtle areas of increased signal in the deep white matter, not as prevalent as on prior study. No abnormal enhancement. 08/11/13 MRI of brain with and without contrast : multiple supratentorial and infratentorial T2 hyperintensities, but no post-contrast enhancement. However, this was not compared to prior imaging. 01/28/14 MRI Cervical spine without contrast: spinal cord unremarkable.[name] MRI of brain with and without contrast from 08/17/14 is stable MRI of brain without contrast from 05/08/15 is stable. MRI of brain with and without contrast from 03/04/17 unchanged from prior study on 05/08/15.  PAST MEDICAL HISTORY: Past Medical History:  Diagnosis Date  . Anxiety   . Aortic atherosclerosis ([place])  09/29/2017   High Res CT in 9/18: aortic atherosclerosis; coronary  artery calcification  . Cervical disc disease    BULGING  . Chronic combined systolic and diastolic CHF (congestive heart failure) ([place]) 09/29/2017   Echo 7/18: Moderate concentric LVH, grade 1 diastolic dysfunction, abnormal septal wall motion due to LBBB, moderate diffuse HK, EF 35-40, atrial septal aneurysm with possible PFO, mild TR // Echo 5/19: EF 50-55, normal wall motion, grade 1 diastolic dysfunction, trivial TR  . Complication of anesthesia    SLOW TO AWAKEN AFTER LAST COLONSCOPY  . Coronary artery disease 05/09/2015   LHC 10/12: EF 55, OM1 80-90 - difficult to engage >> treated medically // Nuc 8/18: EF 40, no ischemia  . Depression   . DJD (degenerative joint disease)    OA  . Dysrhythmia    Not clear where this diagnosis came from  . Emphysema of lung ([place])    Dr. Chase Caller  . Fibromyalgia   . H/O: liver disease 25 YRS AGO   tranamanitis due to previous interferon - LFTs improved off of  . History of MI (myocardial infarction) FEW YRS AGO  . History of TIA (transient ischemic attack)   . Hx of colonic polyps   . Hyperlipidemia    intol to statin - myalgias // ESPERION trial - patient stopped  . Multiple sclerosis ([place])   . Narcotic dependence ([facility])    hx of benzodiazepine and narcotic  . Osteoporosis   . Positive H. pylori test   . Urinary incontinence     MEDICATIONS: Current Outpatient Medications on File Prior to Visit  Medication Sig Dispense Refill  . albuterol (PROAIR HFA) 108 (90 BASE) MCG/ACT inhaler Inhale 2 puffs into the lungs every 6 (six) hours as needed for wheezing or shortness of breath.     . Ascorbic Acid (VITAMIN C) 1000 MG tablet Take 1,000 mg by mouth daily.    [name] aspirin EC 81 MG tablet Take 162 mg by mouth daily.    . Cholecalciferol (VITAMIN D3 PO) Take 1 capsule by mouth daily.     . clonazePAM (KLONOPIN) 1 MG tablet Take 1 mg by mouth as directed.    [name] COENZYME Q10 PO Take 1 capsule by mouth 2 (two) times daily.     . diphenhydrAMINE  (BENADRYL) 50 MG capsule Take 50 mg by mouth as directed.    . doxepin (SINEQUAN) 25 MG capsule Take 1 capsule (25 mg total) by mouth at bedtime. 30 capsule 5  . EVENING PRIMROSE OIL PO Take 2 capsules by mouth 2 (two) times daily.    [name] gabapentin (NEURONTIN) 100 MG capsule Take 100-300 mg by mouth See admin instructions. Take 100 mg by mouth daily and take 300 mg by mouth at bedtime    . glatiramer (COPAXONE) 20 MG/ML SOSY injection USE 1 INJECTION SUBCUTANEOUSLY ONCE A DAY (Patient not taking: Reported on 12/01/2017) 30 mL 11  . guaiFENesin (MUCINEX) 600 MG 12 hr tablet Take 600 mg by mouth 2 (two) times daily as needed for cough or to loosen phlegm.     . Homeopathic Products (SIMILASAN DRY EYE RELIEF OP) Place 1 drop into both eyes 2 (two) times daily.    [name] ipratropium (ATROVENT) 0.02 % nebulizer solution Take 2.5 mLs (0.5 mg total) by nebulization 4 (four) times daily. 120 mL 12  . isosorbide mononitrate (IMDUR) 60 MG 24 hr tablet Take 1.5 tablets (90 mg total) by mouth daily. 135 tablet 3  . LINZESS 290 MCG  CAPS capsule Take 290 mcg by mouth daily before breakfast.     . losartan (COZAAR) 25 MG tablet Take 25 mg by mouth 2 (two) times daily.     [name] lovastatin (MEVACOR) 20 MG tablet Take 20 mg by mouth at bedtime.     . metoprolol succinate (TOPROL-XL) 25 MG 24 hr tablet Take 25 mg by mouth daily.     . modafinil (PROVIGIL) 200 MG tablet TAKE 1 TABLET BY MOUTH TWICE A DAY 60 tablet 5  . Multiple Vitamin (MULTIVITAMIN WITH MINERALS) TABS tablet Take 1 tablet by mouth daily.    . nitroGLYCERIN (NITROSTAT) 0.4 MG SL tablet Place 0.4 mg under the tongue every 5 (five) minutes as needed for chest pain.    Gean Birchwood ER 50 MG TB12 Take 50 mg by mouth every 12 (twelve) hours.     . Omega-3 Fatty Acids (OMEGA 3 PO) Take 1 capsule by mouth daily.    [name] oxyCODONE-acetaminophen (PERCOCET) 5-325 MG per tablet Take 1 tablet by mouth every 4 (four) hours as needed for severe pain.     . polyethylene glycol  powder (GLYCOLAX/MIRALAX) powder Take 17 g by mouth daily as needed for moderate constipation.     [name] PRISTIQ 50 MG 24 hr tablet Take 50 mg by mouth at bedtime.     . ranitidine (ZANTAC) 300 MG tablet Take 300 mg by mouth at bedtime.    [name] umeclidinium bromide (INCRUSE ELLIPTA) 62.5 MCG/INH AEPB Inhale 1 puff into the lungs daily. (Patient not taking: Reported on 12/01/2017) 60 each 0  . vitamin B-12 (CYANOCOBALAMIN) 1000 MCG tablet Take 1,500 mcg by mouth daily.     No current facility-administered medications on file prior to visit.     ALLERGIES: Allergies  Allergen Reactions  . Crestor [Rosuvastatin] Hives and Other (See Comments)    welps  . Sulfonamide Derivatives Hives    As a child  . Contrast Media [Iodinated Diagnostic Agents] Rash and Other (See Comments)    Severe rash    FAMILY HISTORY: Family History  Problem Relation Age of Onset  . Heart attack Mother 76  . Heart attack Father 33  . Cancer Brother    SOCIAL HISTORY: Social History   Socioeconomic History  . Marital status: Divorced    Spouse name: Not on file  . Number of children: 2  . Years of education: 12th  . Highest education level: Not on file  Occupational History  . Occupation: disability    Employer: RETIRED  Social Needs  . Financial resource strain: Not on file  . Food insecurity:    Worry: Not on file    Inability: Not on file  . Transportation needs:    Medical: Not on file    Non-medical: Not on file  Tobacco Use  . Smoking status: Former Smoker    Packs/day: 1.00    Years: 48.00    Pack years: 48.00    Types: Cigarettes    Last attempt to quit: 11/16/2009    Years since quitting: 8.2  . Smokeless tobacco: Never Used  Substance and Sexual Activity  . Alcohol use: No  . Drug use: No  . Sexual activity: Not on file  Lifestyle  . Physical activity:    Days per week: Not on file    Minutes per session: Not on file  . Stress: Not on file  Relationships  . Social connections:     Talks on phone: Not on file  Gets together: Not on file    Attends religious service: Not on file    Active member of club or organization: Not on file    Attends meetings of clubs or organizations: Not on file    Relationship status: Not on file  . Intimate partner violence:    Fear of current or ex partner: Not on file    Emotionally abused: Not on file    Physically abused: Not on file    Forced sexual activity: Not on file  Other Topics Concern  . Not on file  Social History Narrative   Patient lives at home alone. - [place], [place]   Caffeine Use: rarely   Retired - Used to be a Dance movement psychotherapist; owned a Company secretary   Married; 2 children; 1 granddaughter    REVIEW OF SYSTEMS: Constitutional: fatigue Eyes: No visual changes, double vision, eye pain Ear, nose and throat: No hearing loss, ear pain, nasal congestion, sore throat Cardiovascular: No chest pain, palpitations Respiratory:  No shortness of breath at rest or with exertion, wheezes GastrointestinaI: No nausea, vomiting, diarrhea, abdominal pain, fecal incontinence Genitourinary:  No dysuria, urinary retention or frequency Musculoskeletal:  Arms and legs Integumentary: No rash, pruritus, skin lesions Neurological: as above Psychiatric: depression Endocrine: fatigue Hematologic/Lymphatic:  No purpura, petechiae. Allergic/Immunologic: no itchy/runny eyes, nasal congestion, recent allergic reactions, rashes  PHYSICAL EXAM: Blood pressure 118/74, pulse 96, height 5\' 1"  (1.549 m), weight 122 lb (55.3 kg), SpO2 97 %. General: No acute distress.  Patient appears well-groomed.  . Head:  Normocephalic/atraumatic Eyes:  Fundi examined but not visualized Neck: supple, no paraspinal tenderness, full range of motion Heart:  Regular rate and rhythm Lungs:  Clear to auscultation bilaterally Back: No paraspinal tenderness Neurological Exam: alert and oriented to person, place, and time. Attention span and concentration  intact, recent and remote memory intact, fund of knowledge intact.  Speech fluent and not dysarthric, language intact.  Left monocular vision loss.  Otherwise, CN II-XII intact. Bulk and tone normal, muscle strength 5/5 throughout.  Sensation to light touch intact.  Deep tendon reflexes 2+ throughout, toes downgoing.  Finger to nose and heel to shin testing intact.  Unsteady gait.  Romberg with sway.  IMPRESSION: Multiple sclerosis  PLAN: 1.  Continue Copaxone, gabapentin and vitamin D 2.  Follow up in 6 months.  25 minutes spent face to face with patient, over 50% spent discussing management.  Metta Clines, DO  CC: Donald Prose, MD

## 2018-02-11 ENCOUNTER — Encounter: Payer: Self-pay | Admitting: Neurology

## 2018-02-11 ENCOUNTER — Ambulatory Visit: Payer: Medicare Other | Admitting: Neurology

## 2018-02-11 VITALS — BP 118/74 | HR 96 | Ht 61.0 in | Wt 122.0 lb

## 2018-02-11 DIAGNOSIS — G35 Multiple sclerosis: Secondary | ICD-10-CM | POA: Diagnosis not present

## 2018-02-12 ENCOUNTER — Encounter: Payer: Self-pay | Admitting: Physician Assistant

## 2018-02-25 ENCOUNTER — Ambulatory Visit: Payer: Self-pay | Admitting: Physician Assistant

## 2018-05-14 ENCOUNTER — Encounter: Payer: Self-pay | Admitting: Internal Medicine

## 2018-05-14 ENCOUNTER — Ambulatory Visit: Payer: Medicare Other | Admitting: Internal Medicine

## 2018-05-14 VITALS — BP 120/68 | HR 78 | Ht 61.0 in | Wt 125.0 lb

## 2018-05-14 DIAGNOSIS — J439 Emphysema, unspecified: Secondary | ICD-10-CM

## 2018-05-14 NOTE — Progress Notes (Signed)
@Patient  ID: [name], female    DOB: 10/22/1945, 72 y.o.   MRN: [id number]  Chief Complaint  Patient presents with  . Follow-up    Emphysema     Referring provider: Donald Prose, MD  HPI: 72 year old female former smoker followed for emphysema, and lung nodule  TEST  High-resolution CT chest September 2018 showed 16 mm left upper lobe nodule unchanged dating back to September 2014 consistent with benign etiology, mild emphysema   12/01/2017 Follow up : Emphysema  Patient returns for a 76-month follow-up.  Patient was seen last visit.  She was started on ipratropium nebulizers twice daily.  Says that this has really helped her breathing.  She feels less short of breath.  She has been able to be more active. Feels that this is the best she is done in a long time.  She is actually able to get out and do things.  She is very happy with this.  She is now able to walk her dog. PFT today showed normal lung function with FEV1 at 109%, ratio 82, FVC 100%, DLCO 87%.  OV 05/14/2018  Subjective:  Patient ID: [name], female , DOB: [birth year] , age 15 y.o. , MRN: [id number] , ADDRESS: [address] [place] [facility] [address]   05/14/2018 -   Chief Complaint  Patient presents with  . Follow-up    pt reports of sob with exertion & chest discomfort.    + HPI [name] 72 y.o. -presents for follow-up of emphysema associated with bilateral lower lobe crackles.  No evidence of ILD on September 2018 CT chest.  Overall she is stable.  COPD CAT score is only 10.  She is on some kind of a bronchodilator regimen and she is not fully compliant with it.  She says she will step up her compliance.  She says she is up-to-date with the flu shot but we do not have a date on this.  She says she is depressed because it is Christmas time.  She also thinks her cardiologist gave her a bad prognosis.  She does not know what the diseases.    CAT COPD Symptom & Quality of Life Score (GSK  trademark) 0 is no burden. 5 is highest burden 05/14/2018 0  Never Cough -> Cough all the time 0  No phlegm in chest -> Chest is full of phlegm 0  No chest tightness -> Chest feels very tight 0  No dyspnea for 1 flight stairs/hill -> Very dyspneic for 1 flight of stairs 3  No limitations for ADL at home -> Very limited with ADL at home 3  Confident leaving home -> Not at all confident leaving home 0  Sleep soundly -> Do not sleep soundly because of lung condition 0  Lots of Energy -> No energy at all 5  TOTAL Score (max 40)  10      ROS - per HPI     has a past medical history of Anxiety, Aortic atherosclerosis ([place]) (09/29/2017), Cervical disc disease, Chronic combined systolic and diastolic CHF (congestive heart failure) ([place]) (83/15/1761), Complication of anesthesia, Coronary artery disease (05/09/2015), Depression, DJD (degenerative joint disease), Dysrhythmia, Emphysema of lung ([place]), Fibromyalgia, H/O: liver disease (18 YRS AGO), History of MI (myocardial infarction) (FEW YRS AGO), History of TIA (transient ischemic attack), colonic polyps, Hyperlipidemia, Multiple sclerosis ([place]), Narcotic dependence ([place]), Osteoporosis, Positive H. pylori test, and Urinary incontinence.   reports that she quit smoking about 8 years  ago. Her smoking use included cigarettes. She has a 48.00 pack-year smoking history. She has never used smokeless tobacco.  Past Surgical History:  Procedure Laterality Date  . BACK SURGERY  11-2009   LOWER BACK  . BOTOX INJECTION N/A 03/21/2016   Procedure: BOTOX INJECTION;  Surgeon: Wilford Corner, MD;  Location: [facility];  Service: Endoscopy;  Laterality: N/A;  . CARDIAC CATHETERIZATION     UNSUCCESSFUL STENT PLACEMENT  . COLONSCOPY    . ESOPHAGEAL MANOMETRY N/A 10/23/2015   Procedure: ESOPHAGEAL MANOMETRY (EM);  Surgeon: Wilford Corner, MD;  Location: [facility];  Service: Endoscopy;  Laterality: N/A;  . ESOPHAGOGASTRODUODENOSCOPY (EGD) WITH PROPOFOL  N/A 03/21/2016   Procedure: ESOPHAGOGASTRODUODENOSCOPY (EGD) WITH PROPOFOL;  Surgeon: Wilford Corner, MD;  Location: [facility];  Service: Endoscopy;  Laterality: N/A;  . RIGHT/LEFT HEART CATH AND CORONARY ANGIOGRAPHY N/A 10/02/2017   Procedure: RIGHT/LEFT HEART CATH AND CORONARY ANGIOGRAPHY;  Surgeon: Nelva Bush, MD;  Location: [facility];  Service: Cardiovascular;  Laterality: N/A;  . TUBAL LIGATION      Allergies  Allergen Reactions  . Crestor [Rosuvastatin] Hives and Other (See Comments)    welps  . Sulfonamide Derivatives Hives    As a child  . Contrast Media [Iodinated Diagnostic Agents] Rash and Other (See Comments)    Severe rash    Immunization History  Administered Date(s) Administered  . Influenza Split 02/20/2011, 02/20/2012, 03/03/2014  . Influenza Whole 02/17/2017  . Influenza,inj,Quad PF,6+ Mos 02/19/2013  . Pneumococcal Conjugate-13 02/05/2017  . Pneumococcal Polysaccharide-23 06/01/2012, 03/03/2014  . Td 06/03/2002    Family History  Problem Relation Age of Onset  . Heart attack Mother 10  . Heart attack Father 72  . Cancer Brother      Current Outpatient Medications:  .  albuterol (PROAIR HFA) 108 (90 BASE) MCG/ACT inhaler, Inhale 2 puffs into the lungs every 6 (six) hours as needed for wheezing or shortness of breath. , Disp: , Rfl:  .  Ascorbic Acid (VITAMIN C) 1000 MG tablet, Take 1,000 mg by mouth daily., Disp: , Rfl:  .  aspirin EC 81 MG tablet, Take 162 mg by mouth daily., Disp: , Rfl:  .  Cholecalciferol (VITAMIN D3 PO), Take 1 capsule by mouth daily. , Disp: , Rfl:  .  clonazePAM (KLONOPIN) 1 MG tablet, Take 1 mg by mouth as directed., Disp: , Rfl:  .  COENZYME Q10 PO, Take 1 capsule by mouth 2 (two) times daily. , Disp: , Rfl:  .  diphenhydrAMINE (BENADRYL) 50 MG capsule, Take 50 mg by mouth as directed., Disp: , Rfl:  .  doxepin (SINEQUAN) 25 MG capsule, Take 1 capsule (25 mg total) by mouth at bedtime., Disp: 30 capsule, Rfl:  5 .  EVENING PRIMROSE OIL PO, Take 2 capsules by mouth 2 (two) times daily., Disp: , Rfl:  .  gabapentin (NEURONTIN) 100 MG capsule, Take 100-300 mg by mouth See admin instructions. Take 100 mg by mouth daily and take 300 mg by mouth at bedtime, Disp: , Rfl:  .  gabapentin (NEURONTIN) 300 MG capsule, Take 300 mg by mouth at bedtime., Disp: , Rfl:  .  glatiramer (COPAXONE) 20 MG/ML SOSY injection, USE 1 INJECTION SUBCUTANEOUSLY ONCE A DAY, Disp: 30 mL, Rfl: 11 .  guaiFENesin (MUCINEX) 600 MG 12 hr tablet, Take 600 mg by mouth 2 (two) times daily as needed for cough or to loosen phlegm. , Disp: , Rfl:  .  Homeopathic Products (SIMILASAN DRY EYE RELIEF OP), Place  1 drop into both eyes 2 (two) times daily., Disp: , Rfl:  .  ipratropium (ATROVENT) 0.02 % nebulizer solution, Take 2.5 mLs (0.5 mg total) by nebulization 4 (four) times daily., Disp: 120 mL, Rfl: 12 .  LINZESS 290 MCG CAPS capsule, Take 290 mcg by mouth daily before breakfast. , Disp: , Rfl:  .  losartan (COZAAR) 25 MG tablet, Take 25 mg by mouth 2 (two) times daily. , Disp: , Rfl:  .  lovastatin (MEVACOR) 20 MG tablet, Take 20 mg by mouth at bedtime. , Disp: , Rfl:  .  metoprolol succinate (TOPROL-XL) 25 MG 24 hr tablet, Take 25 mg by mouth daily. , Disp: , Rfl:  .  modafinil (PROVIGIL) 200 MG tablet, TAKE 1 TABLET BY MOUTH TWICE A DAY, Disp: 60 tablet, Rfl: 5 .  Multiple Vitamin (MULTIVITAMIN WITH MINERALS) TABS tablet, Take 1 tablet by mouth daily., Disp: , Rfl:  .  nitroGLYCERIN (NITROSTAT) 0.4 MG SL tablet, Place 0.4 mg under the tongue every 5 (five) minutes as needed for chest pain., Disp: , Rfl:  .  NUCYNTA ER 50 MG TB12, Take 50 mg by mouth every 12 (twelve) hours. , Disp: , Rfl:  .  Omega-3 Fatty Acids (OMEGA 3 PO), Take 1 capsule by mouth daily., Disp: , Rfl:  .  oxyCODONE-acetaminophen (PERCOCET) 5-325 MG per tablet, Take 1 tablet by mouth every 4 (four) hours as needed for severe pain. , Disp: , Rfl:  .  polyethylene glycol  powder (GLYCOLAX/MIRALAX) powder, Take 17 g by mouth daily as needed for moderate constipation. , Disp: , Rfl:  .  PRISTIQ 50 MG 24 hr tablet, Take 50 mg by mouth at bedtime. , Disp: , Rfl:  .  ranitidine (ZANTAC) 300 MG tablet, Take 300 mg by mouth at bedtime., Disp: , Rfl:  .  vitamin B-12 (CYANOCOBALAMIN) 1000 MCG tablet, Take 1,500 mcg by mouth daily., Disp: , Rfl:  .  isosorbide mononitrate (IMDUR) 60 MG 24 hr tablet, Take 1.5 tablets (90 mg total) by mouth daily., Disp: 135 tablet, Rfl: 3 .  umeclidinium bromide (INCRUSE ELLIPTA) 62.5 MCG/INH AEPB, Inhale 1 puff into the lungs daily. (Patient not taking: Reported on 05/14/2018), Disp: 60 each, Rfl: 0      Objective:   Vitals:   05/14/18 1337  BP: 120/68  Pulse: 78  SpO2: 97%  Weight: 125 lb (56.7 kg)  Height: 5\' 1"  (1.549 m)    Estimated body mass index is 23.62 kg/m as calculated from the following:   Height as of this encounter: 5\' 1"  (1.549 m).   Weight as of this encounter: 125 lb (56.7 kg).  @WEIGHTCHANGE @  [employer]   05/14/18 1337  Weight: 125 lb (56.7 kg)     Physical Exam  General Appearance:    Alert, cooperative, no distress, appears stated age - older , Deconditioned looking - yes , OBESE  - no, Sitting on Wheelchair -  no  Head:    Normocephalic, without obvious abnormality, atraumatic  Eyes:    PERRL, conjunctiva/corneas clear,  Ears:    Normal TM's and external ear canals, both ears  Nose:   Nares normal, septum midline, mucosa normal, no drainage    or sinus tenderness. OXYGEN ON  - no . Patient is @ ra   Throat:   Lips, mucosa, and tongue normal; teeth and gums normal. Cyanosis on lips - no  Neck:   Supple, symmetrical, trachea midline, no adenopathy;    thyroid:  no enlargement/tenderness/nodules;  no carotid   bruit or JVD  Back:     Symmetric, no curvature, ROM normal, no CVA tenderness  Lungs:     Distress - no , Wheeze no, Barrell Chest - no, Purse lip breathing - no, Crackles - UES   Chest  Wall:    No tenderness or deformity.    Heart:    Regular rate and rhythm, S1 and S2 normal, no rub   or gallop, Murmur - no  Breast Exam:    NOT DONE  Abdomen:     Soft, non-tender, bowel sounds active all four quadrants,    no masses, no organomegaly. Visceral obesity - no  Genitalia:   NOT DONE  Rectal:   NOT DONE  Extremities:   Extremities - normal, Has Cane - no, Clubbing - no, Edema - no  Pulses:   2+ and symmetric all extremities  Skin:   Stigmata of Connective Tissue Disease - no  Lymph nodes:   Cervical, supraclavicular, and axillary nodes normal  Psychiatric:  Neurologic:   Pleasant - yes, Anxious - yes, Flat affect - yes  CAm-ICU - neg, Alert and Oriented x 3 - yes, Moves all 4s - yes, Speech - normal, Cognition - intact           Assessment:       ICD-10-CM   1. Pulmonary emphysema, unspecified emphysema type ([place]) J43.9        Plan:     Patient Instructions     ICD-10-CM   1. Pulmonary emphysema, unspecified emphysema type ([place]) J43.9     Stable emphysema  Plan - address  Your mood depression with pcp Donald Prose, MD - continue atrovent nebs or anoro - but improve compliance  - use albuterol as needed - give my CMA the date of your flu shot so we have in our records  Followup 6-9 months or sooner; CAT score at followup     SIGNATURE    Dr. Brand Males, M.D., F.C.C.P,  Pulmonary and Critical Care Medicine Staff Physician, [place] Director - Interstitial Lung Disease  Program  [facility] [place], [place], [address]  Pager: [phone], If no answer or between  15:00h - 7:00h: call [phone] Telephone: [phone]  1:55 PM 05/14/2018

## 2018-05-14 NOTE — Patient Instructions (Addendum)
ICD-10-CM   1. Pulmonary emphysema, unspecified emphysema type ([place]) J43.9     Stable emphysema  Plan - address  Your mood depression with pcp Donald Prose, MD - continue atrovent nebs or anoro - but improve compliance  - use albuterol as needed - give my CMA the date of your flu shot so we have in our records  Followup 6-9 months or sooner; CAT score at followup

## 2018-05-15 ENCOUNTER — Telehealth: Payer: Self-pay | Admitting: Internal Medicine

## 2018-05-15 NOTE — Telephone Encounter (Signed)
Attempted to contact pt. Received a message that my call could not be completed as dialed x2. Will try back.

## 2018-05-18 ENCOUNTER — Other Ambulatory Visit: Payer: Self-pay | Admitting: Neurology

## 2018-05-18 NOTE — Telephone Encounter (Signed)
Attempted to contact pt. Received a message that my call could not be completed as dialed x2. Will try back.  No other  Phone number on file

## 2018-05-19 NOTE — Telephone Encounter (Signed)
Attempted to call pt but immediately when I called, I got a response stating the call could not be completed as dialed. Will try to call back later.

## 2018-05-20 ENCOUNTER — Other Ambulatory Visit: Payer: Self-pay | Admitting: *Deleted

## 2018-05-20 MED ORDER — ALBUTEROL SULFATE HFA 108 (90 BASE) MCG/ACT IN AERS: 2.0000 | INHALATION_SPRAY | Freq: Four times a day (QID) | RESPIRATORY_TRACT | 5 refills | Status: DC | PRN

## 2018-05-20 NOTE — Telephone Encounter (Signed)
Attempted to call pt but immediately got a response stating that the call could not be completed at this time. Due to multiple attempts trying to reach pt and not being able to do so, per triage protocol message will be closed.

## 2018-05-28 ENCOUNTER — Other Ambulatory Visit: Payer: Self-pay | Admitting: Family Medicine

## 2018-05-28 DIAGNOSIS — M810 Age-related osteoporosis without current pathological fracture: Secondary | ICD-10-CM

## 2018-06-02 ENCOUNTER — Encounter: Payer: Self-pay | Admitting: Primary Care

## 2018-06-02 ENCOUNTER — Ambulatory Visit: Payer: Medicare Other | Admitting: Primary Care

## 2018-06-02 ENCOUNTER — Ambulatory Visit (INDEPENDENT_AMBULATORY_CARE_PROVIDER_SITE_OTHER)
Admission: RE | Admit: 2018-06-02 | Discharge: 2018-06-02 | Disposition: A | Discharge status: Home / Self Care | Payer: Medicare Other | Source: Ambulatory Visit | Attending: Primary Care | Admitting: Primary Care

## 2018-06-02 VITALS — BP 134/90 | HR 102 | Temp 98.0°F | Ht 61.0 in | Wt 122.4 lb

## 2018-06-02 DIAGNOSIS — R0781 Pleurodynia: Secondary | ICD-10-CM

## 2018-06-02 DIAGNOSIS — I5042 Chronic combined systolic (congestive) and diastolic (congestive) heart failure: Secondary | ICD-10-CM | POA: Diagnosis not present

## 2018-06-02 DIAGNOSIS — R140 Abdominal distension (gaseous): Secondary | ICD-10-CM | POA: Diagnosis not present

## 2018-06-02 LAB — COMPREHENSIVE METABOLIC PANEL
ALT: 15 U/L (ref 0–35)
AST: 22 U/L (ref 0–37)
Albumin: 4.2 g/dL (ref 3.5–5.2)
Alkaline Phosphatase: 101 U/L (ref 39–117)
BILIRUBIN TOTAL: 0.3 mg/dL (ref 0.2–1.2)
BUN: 11 mg/dL (ref 6–23)
CO2: 29 meq/L (ref 19–32)
Calcium: 9.1 mg/dL (ref 8.4–10.5)
Chloride: 94 mEq/L — ABNORMAL LOW (ref 96–112)
Creatinine, Ser: 0.83 mg/dL (ref 0.40–1.20)
GFR: 71.55 mL/min (ref >60.00)
GLUCOSE: 83 mg/dL (ref 70–99)
POTASSIUM: 3.9 meq/L (ref 3.5–5.1)
Sodium: 130 mEq/L — ABNORMAL LOW (ref 135–145)
Total Protein: 6.7 g/dL (ref 6.0–8.3)

## 2018-06-02 LAB — BRAIN NATRIURETIC PEPTIDE: PRO B NATRI PEPTIDE: 36 pg/mL (ref 0.0–100.0)

## 2018-06-02 NOTE — Assessment & Plan Note (Signed)
-   Stable; no signs of exacerbation  - Continues Incruse 1 puff daily

## 2018-06-02 NOTE — Assessment & Plan Note (Addendum)
-   Stable; no signs of fluid volume overload - BNP normal

## 2018-06-02 NOTE — Assessment & Plan Note (Addendum)
-   On exam, abd softly distended and tender +hyperactive bowel signs  - Liver function normal  - Advised Gas-x as needed  - Follow up with GI recommended

## 2018-06-02 NOTE — Progress Notes (Signed)
@Patient  ID: [name], female    DOB: [birth year], 72 y.o.   MRN: [id number]  Chief Complaint  Patient presents with  . Acute Visit    pain and burn in ribs x3 weeks moves to back, bloating    Referring provider: Donald Prose, MD  HPI: 72 year old, former smoker (48 pack year hx). PMH COPD, chronic combined systolic and diastolic heart failure, CAD, TIA, dysphagia, MS, fibromyalgia, anxiety/depression, PTSD, ADD, constipation, peptic ulcer. Patient of Dr. Brantley Persons, last seen by 05/14/18.   06/02/2018  Patient presents today for acute visit with complaints of bilateral rib/posterior back pain x 3-4 weeks. Started back at the gym two weeks ago. Doing some pushing/pulling exercises. Breathing is fine. Associated bloating. Hx hiatial hernia, GERD and peptic ulcer and constipation followed by GI. Last bm 3 days ago, taking miralax and MOM as needed. She has been belching and regurgitating food/foam at times. Thinks she may have made a mistake by coming here, feels its more gastrointestinal. Denies active chest pain, abdominal pain, blood in stools, wheezing, cough.  Allergies  Allergen Reactions  . Crestor [Rosuvastatin] Hives and Other (See Comments)    welps  . Sulfonamide Derivatives Hives    As a child  . Contrast Media [Iodinated Diagnostic Agents] Rash and Other (See Comments)    Severe rash    Immunization History  Administered Date(s) Administered  . Influenza Split 02/20/2011, 02/20/2012, 03/03/2014  . Influenza Whole 02/17/2017  . Influenza, High Dose Seasonal PF 02/09/2018  . Influenza,inj,Quad PF,6+ Mos 02/19/2013  . Pneumococcal Conjugate-13 02/05/2017  . Pneumococcal Polysaccharide-23 06/01/2012, 03/03/2014  . Td 06/03/2002    Past Medical History:  Diagnosis Date  . Anxiety   . Aortic atherosclerosis ([place]) 09/29/2017   High Res CT in 9/18: aortic atherosclerosis; coronary artery calcification  . Cervical disc disease    BULGING  . Chronic combined systolic  and diastolic CHF (congestive heart failure) ([place]) 09/29/2017   Echo 7/18: Moderate concentric LVH, grade 1 diastolic dysfunction, abnormal septal wall motion due to LBBB, moderate diffuse HK, EF 35-40, atrial septal aneurysm with possible PFO, mild TR // Echo 5/19: EF 50-55, normal wall motion, grade 1 diastolic dysfunction, trivial TR  . Complication of anesthesia    SLOW TO AWAKEN AFTER LAST COLONSCOPY  . Coronary artery disease 05/09/2015   LHC 10/12: EF 55, OM1 80-90 - difficult to engage >> treated medically // Nuc 8/18: EF 40, no ischemia  . Depression   . DJD (degenerative joint disease)    OA  . Dysrhythmia    Not clear where this diagnosis came from  . Emphysema of lung ([place])    Dr. Chase Caller  . Fibromyalgia   . H/O: liver disease 20 YRS AGO   tranamanitis due to previous interferon - LFTs improved off of  . History of MI (myocardial infarction) FEW YRS AGO  . History of TIA (transient ischemic attack)   . Hx of colonic polyps   . Hyperlipidemia    intol to statin - myalgias // ESPERION trial - patient stopped  . Multiple sclerosis ([place])   . Narcotic dependence ([facility])    hx of benzodiazepine and narcotic  . Osteoporosis   . Positive H. pylori test   . Urinary incontinence     Tobacco History: Social History   Tobacco Use  Smoking Status Former Smoker  . Packs/day: 1.00  . Years: 48.00  . Pack years: 48.00  . Types: Cigarettes  . Last attempt to quit:  11/16/2009  . Years since quitting: 8.5  Smokeless Tobacco Never Used   Counseling given: Not Answered   Outpatient Medications Prior to Visit  Medication Sig Dispense Refill  . albuterol (PROAIR HFA) 108 (90 Base) MCG/ACT inhaler Inhale 2 puffs into the lungs every 6 (six) hours as needed for wheezing or shortness of breath. 1 Inhaler 5  . Ascorbic Acid (VITAMIN C) 1000 MG tablet Take 1,000 mg by mouth daily.    [name] aspirin EC 81 MG tablet Take 162 mg by mouth daily.    . Cholecalciferol (VITAMIN D3 PO) Take 1  capsule by mouth daily.     . clonazePAM (KLONOPIN) 1 MG tablet Take 1 mg by mouth as directed.    [name] COENZYME Q10 PO Take 1 capsule by mouth 2 (two) times daily.     . diphenhydrAMINE (BENADRYL) 50 MG capsule Take 50 mg by mouth as directed.    . doxepin (SINEQUAN) 25 MG capsule Take 1 capsule (25 mg total) by mouth at bedtime. 30 capsule 5  . EVENING PRIMROSE OIL PO Take 2 capsules by mouth 2 (two) times daily.    [name] gabapentin (NEURONTIN) 100 MG capsule Take 100-300 mg by mouth See admin instructions. Take 100 mg by mouth daily and take 300 mg by mouth at bedtime    . gabapentin (NEURONTIN) 300 MG capsule Take 1 capsule (300 mg total) by mouth daily. 180 capsule 1  . glatiramer (COPAXONE) 20 MG/ML SOSY injection USE 1 INJECTION SUBCUTANEOUSLY ONCE A DAY 30 mL 11  . guaiFENesin (MUCINEX) 600 MG 12 hr tablet Take 600 mg by mouth 2 (two) times daily as needed for cough or to loosen phlegm.     . Homeopathic Products (SIMILASAN DRY EYE RELIEF OP) Place 1 drop into both eyes 2 (two) times daily.    [name] ipratropium (ATROVENT) 0.02 % nebulizer solution Take 2.5 mLs (0.5 mg total) by nebulization 4 (four) times daily. 120 mL 12  . LINZESS 290 MCG CAPS capsule Take 290 mcg by mouth daily before breakfast.     . losartan (COZAAR) 25 MG tablet Take 25 mg by mouth 2 (two) times daily.     [name] lovastatin (MEVACOR) 20 MG tablet Take 20 mg by mouth at bedtime.     . modafinil (PROVIGIL) 200 MG tablet TAKE 1 TABLET BY MOUTH TWICE A DAY 60 tablet 5  . Multiple Vitamin (MULTIVITAMIN WITH MINERALS) TABS tablet Take 1 tablet by mouth daily.    . nitroGLYCERIN (NITROSTAT) 0.4 MG SL tablet Place 0.4 mg under the tongue every 5 (five) minutes as needed for chest pain.    Gean [place] ER 50 MG TB12 Take 50 mg by mouth every 12 (twelve) hours.     . Omega-3 Fatty Acids (OMEGA 3 PO) Take 1 capsule by mouth daily.    [name] oxyCODONE-acetaminophen (PERCOCET) 5-325 MG per tablet Take 1 tablet by mouth every 4 (four) hours as needed  for severe pain.     . polyethylene glycol powder (GLYCOLAX/MIRALAX) powder Take 17 g by mouth daily as needed for moderate constipation.     [name] PRISTIQ 50 MG 24 hr tablet Take 50 mg by mouth at bedtime.     . ranitidine (ZANTAC) 300 MG tablet Take 300 mg by mouth at bedtime.    [name] umeclidinium bromide (INCRUSE ELLIPTA) 62.5 MCG/INH AEPB Inhale 1 puff into the lungs daily. 60 each 0  . vitamin B-12 (CYANOCOBALAMIN) 1000 MCG tablet Take 1,500 mcg by mouth daily.    [name]  isosorbide mononitrate (IMDUR) 60 MG 24 hr tablet Take 1.5 tablets (90 mg total) by mouth daily. 135 tablet 3  . metoprolol succinate (TOPROL-XL) 25 MG 24 hr tablet Take 25 mg by mouth daily.      No facility-administered medications prior to visit.     Review of Systems  Review of Systems  Constitutional: Negative.   HENT: Negative.   Respiratory: Negative for cough, shortness of breath and wheezing.   Cardiovascular: Negative for leg swelling.  Gastrointestinal: Positive for abdominal distention, abdominal pain and constipation. Negative for blood in stool, nausea and vomiting.   Physical Exam  BP 134/90 (BP Location: Right Arm, Cuff Size: Normal)   Pulse (!) 102   Temp 98 F (36.7 C)   Ht 5\' 1"  (1.549 m)   Wt 122 lb 6.4 oz (55.5 kg)   SpO2 98%   BMI 23.13 kg/m  Physical Exam Constitutional:      General: She is not in acute distress.    Appearance: Normal appearance. She is normal weight. She is not ill-appearing.  HENT:     Head: Normocephalic and atraumatic.     Mouth/Throat:     Mouth: Mucous membranes are moist.     Pharynx: Oropharynx is clear.  Neck:     Musculoskeletal: Normal range of motion and neck supple.  Cardiovascular:     Rate and Rhythm: Normal rate and regular rhythm.  Pulmonary:     Effort: Pulmonary effort is normal. No respiratory distress.     Breath sounds: Normal breath sounds.  Abdominal:     General: There is distension.     Palpations: Abdomen is soft.     Comments: Soft,  tender. +hyperactive bowel sounds   Musculoskeletal: Normal range of motion.  Neurological:     General: No focal deficit present.     Mental Status: She is alert and oriented to person, place, and time.  Psychiatric:        Mood and Affect: Mood normal.        Behavior: Behavior normal.        Thought Content: Thought content normal.        Judgment: Judgment normal.      Lab Results:  CBC    Component Value Date/Time   WBC 8.4 09/29/2017 1649   WBC 12.8 (H) 07/25/2015 1439   RBC 3.98 09/29/2017 1649   RBC 3.60 (L) 07/25/2015 1439   HGB 11.9 09/29/2017 1649   HCT 35.5 09/29/2017 1649   PLT 335 09/29/2017 1649   MCV 89 09/29/2017 1649   MCH 29.9 09/29/2017 1649   MCH 30.0 07/25/2015 1439   MCHC 33.5 09/29/2017 1649   MCHC 35.2 07/25/2015 1439   RDW 13.9 09/29/2017 1649   LYMPHSABS 1.8 05/08/2015 1441   MONOABS 0.5 05/08/2015 1441   EOSABS 0.4 05/08/2015 1441   BASOSABS 0.0 05/08/2015 1441    BMET    Component Value Date/Time   NA 130 (L) 06/02/2018 1049   NA 135 09/29/2017 1649   K 3.9 06/02/2018 1049   CL 94 (L) 06/02/2018 1049   CO2 29 06/02/2018 1049   GLUCOSE 83 06/02/2018 1049   BUN 11 06/02/2018 1049   BUN 12 09/29/2017 1649   CREATININE 0.83 06/02/2018 1049   CALCIUM 9.1 06/02/2018 1049   GFRNONAA 73 09/29/2017 1649   GFRAA 84 09/29/2017 1649    BNP No results found for: BNP  ProBNP    Component Value Date/Time   PROBNP 36.0 06/02/2018 1049  Imaging: No results found.   Assessment & Plan:   Rib pain - Likely muscular skeletal in nature - Will check CXR   Abdominal bloating - On exam, abd softly distended and tender +hyperactive bowel signs  - Liver function normal  - Advised Gas-x as needed  - Follow up with GI recommended   CONSTIPATION - Continues Miralax and MOM as needed for constipation  COPD (chronic obstructive pulmonary disease) ([place]) - Stable; no signs of exacerbation  - Continues Incruse 1 puff daily   Chronic  combined systolic and diastolic CHF (congestive heart failure) ([place]) - Stable; no signs of fluid volume overload - BNP normal    Martyn Ehrich, NP 06/02/2018

## 2018-06-02 NOTE — Assessment & Plan Note (Signed)
-   Continues Miralax and MOM as needed for constipation

## 2018-06-02 NOTE — Assessment & Plan Note (Signed)
-   Likely muscular skeletal in nature - Will check CXR

## 2018-06-02 NOTE — Patient Instructions (Addendum)
Labs and CXR today  Gas-X as needed for bloating/gas (over the counter)  Follow up with GI

## 2018-06-08 ENCOUNTER — Telehealth: Payer: Self-pay | Admitting: Internal Medicine

## 2018-06-08 NOTE — Telephone Encounter (Signed)
I tried calling pt but keep getting a message that states the call cannot be completed at this time X 3. Will try again later.   Notes recorded by Martyn Ehrich, NP on 06/02/2018 at 12:58 PM EST Labs looked ok. Liver function, kidney function and BNP was normal. Sodium was a little low, recommend salting her food. Tried calling her mobile/home phone number listed in chart but it was not working.

## 2018-06-09 NOTE — Telephone Encounter (Signed)
Called patient and unable to reach left message to give [place] a call back.

## 2018-06-10 NOTE — Telephone Encounter (Signed)
Notes recorded by Martyn Ehrich, NP on 06/05/2018 at 9:42 AM EST CXR showed no evidence of PNA or CHF. Some chronic bronchitic changes related to smoking, can discuss further with Dr. Chase Caller. HRCT in 2018 showed no significant findings except for emphysema  Pt aware of results of labs and CXR; states she has continued to have sore ribs and painful: no cough but states she has noticed several spots on her rib area and noticed itching when she wore her gold necklaces. She has tried Neosporin on the areas-pt will call her PCP today to get checked out for ? shingles. Pt is aware that she can possibly try heat/ice and OTC Tylenol as well.   Nothing more needed at this time.  Will forward to [facility] as FYI.

## 2018-06-10 NOTE — Telephone Encounter (Signed)
Yes, I agree with seeing PCP

## 2018-06-11 ENCOUNTER — Other Ambulatory Visit: Payer: Self-pay | Admitting: Gastroenterology

## 2018-06-11 DIAGNOSIS — R1084 Generalized abdominal pain: Secondary | ICD-10-CM

## 2018-06-12 ENCOUNTER — Ambulatory Visit: Payer: Self-pay | Admitting: Physician Assistant

## 2018-06-16 ENCOUNTER — Other Ambulatory Visit: Payer: Self-pay

## 2018-06-19 ENCOUNTER — Other Ambulatory Visit: Payer: Self-pay

## 2018-06-22 ENCOUNTER — Telehealth: Payer: Self-pay | Admitting: Internal Medicine

## 2018-06-22 NOTE — Telephone Encounter (Signed)
ATC again and msg still states the call can not be completed at this

## 2018-06-22 NOTE — Telephone Encounter (Signed)
ATC again and msg states call can not be completed at this time

## 2018-06-22 NOTE — Telephone Encounter (Signed)
Closed in error, will hold in triage

## 2018-06-22 NOTE — Telephone Encounter (Signed)
ATC to contact pt. Received a recording stating my call could not be completed at this time. Will try back.

## 2018-06-26 ENCOUNTER — Other Ambulatory Visit: Payer: Self-pay

## 2018-07-07 ENCOUNTER — Other Ambulatory Visit: Payer: Self-pay

## 2018-07-13 ENCOUNTER — Other Ambulatory Visit: Payer: Self-pay | Admitting: Neurology

## 2018-07-13 ENCOUNTER — Ambulatory Visit: Payer: Self-pay | Admitting: Primary Care

## 2018-07-14 ENCOUNTER — Encounter: Payer: Self-pay | Admitting: Primary Care

## 2018-07-14 ENCOUNTER — Ambulatory Visit: Payer: Medicare Other | Admitting: Primary Care

## 2018-07-14 VITALS — BP 110/70 | HR 89 | Ht 61.0 in | Wt 118.0 lb

## 2018-07-14 DIAGNOSIS — R0781 Pleurodynia: Secondary | ICD-10-CM | POA: Diagnosis not present

## 2018-07-14 MED ORDER — PREDNISONE 10 MG PO TABS: ORAL_TABLET | ORAL | 0 refills | Status: DC

## 2018-07-14 NOTE — Progress Notes (Addendum)
@Patient  ID: [name], female    DOB: [birth year], 73 y.o.   MRN: [id number]  Chief Complaint  Patient presents with  . Chest Pain    Left sided ribs have been hurting for the past few days. Thinks she may have cracked a rib with a recent cold.     Referring provider: Donald Prose, MD  HPI: 73 year old female, former smoker quit 2011(48 pack year hx). PMH COPD, fibromyalgia, MS, heart failure. Patient of Dr. Chase Caller, last see by NP on 06/02/18. CXR showed no evidence of PNA or CHF. Some chronic bronchitic changes related to smoking. HRCT in 2018 showed no significant findings except for emphysema.   07/14/2018 Patient presents today for acute visit with continued complaints of left pleuritic pain. Reports that she had a bad cough which has slowly improved. Left rib cage is sore to touch and skin burns. No rash. Takes percocet for chronic pain/fibromyalgia, unsure if it helps. LFTs were normal. Ordered for CT abd but patient cancelled d/t cough. She has not been taking incruse inhaler or Atrovent nebulizer as often as it is prescribed. EKG today showed SR with left bundle branch block, unchanged from previous in May 2019. Denies shortness of breath.    Allergies  Allergen Reactions  . Crestor [Rosuvastatin] Hives and Other (See Comments)    welps  . Sulfonamide Derivatives Hives    As a child  . Contrast Media [Iodinated Diagnostic Agents] Rash and Other (See Comments)    Severe rash    Immunization History  Administered Date(s) Administered  . Influenza Split 02/20/2011, 02/20/2012, 03/03/2014  . Influenza Whole 02/17/2017  . Influenza, High Dose Seasonal PF 02/09/2018  . Influenza,inj,Quad PF,6+ Mos 02/19/2013  . Pneumococcal Conjugate-13 02/05/2017  . Pneumococcal Polysaccharide-23 06/01/2012, 03/03/2014  . Td 06/03/2002    Past Medical History:  Diagnosis Date  . Anxiety   . Aortic atherosclerosis ([place]) 09/29/2017   High Res CT in 9/18: aortic atherosclerosis;  coronary artery calcification  . Cervical disc disease    BULGING  . Chronic combined systolic and diastolic CHF (congestive heart failure) ([place]) 09/29/2017   Echo 7/18: Moderate concentric LVH, grade 1 diastolic dysfunction, abnormal septal wall motion due to LBBB, moderate diffuse HK, EF 35-40, atrial septal aneurysm with possible PFO, mild TR // Echo 5/19: EF 50-55, normal wall motion, grade 1 diastolic dysfunction, trivial TR  . Complication of anesthesia    SLOW TO AWAKEN AFTER LAST COLONSCOPY  . Coronary artery disease 05/09/2015   LHC 10/12: EF 55, OM1 80-90 - difficult to engage >> treated medically // Nuc 8/18: EF 40, no ischemia  . Depression   . DJD (degenerative joint disease)    OA  . Dysrhythmia    Not clear where this diagnosis came from  . Emphysema of lung ([place])    Dr. Chase Caller  . Fibromyalgia   . H/O: liver disease 85 YRS AGO   tranamanitis due to previous interferon - LFTs improved off of  . History of MI (myocardial infarction) FEW YRS AGO  . History of TIA (transient ischemic attack)   . Hx of colonic polyps   . Hyperlipidemia    intol to statin - myalgias // ESPERION trial - patient stopped  . Multiple sclerosis ([place])   . Narcotic dependence (HCC)    hx of benzodiazepine and narcotic  . Osteoporosis   . Positive H. pylori test   . Urinary incontinence     Tobacco History: Social History  Tobacco Use  Smoking Status Former Smoker  . Packs/day: 1.00  . Years: 48.00  . Pack years: 48.00  . Types: Cigarettes  . Last attempt to quit: 11/16/2009  . Years since quitting: 8.6  Smokeless Tobacco Never Used   Counseling given: Not Answered   Outpatient Medications Prior to Visit  Medication Sig Dispense Refill  . albuterol (PROAIR HFA) 108 (90 Base) MCG/ACT inhaler Inhale 2 puffs into the lungs every 6 (six) hours as needed for wheezing or shortness of breath. 1 Inhaler 5  . Ascorbic Acid (VITAMIN C) 1000 MG tablet Take 1,000 mg by mouth daily.    [name]  aspirin EC 81 MG tablet Take 162 mg by mouth daily.    . Cholecalciferol (VITAMIN D3 PO) Take 1 capsule by mouth daily.     . clonazePAM (KLONOPIN) 1 MG tablet Take 1 mg by mouth as directed.    [name] COENZYME Q10 PO Take 1 capsule by mouth 2 (two) times daily.     . diphenhydrAMINE (BENADRYL) 50 MG capsule Take 50 mg by mouth as directed.    . doxepin (SINEQUAN) 25 MG capsule Take 1 capsule (25 mg total) by mouth at bedtime. 30 capsule 5  . EVENING PRIMROSE OIL PO Take 2 capsules by mouth 2 (two) times daily.    [name] gabapentin (NEURONTIN) 100 MG capsule Take 100-300 mg by mouth See admin instructions. Take 100 mg by mouth daily and take 300 mg by mouth at bedtime    . gabapentin (NEURONTIN) 300 MG capsule Take 1 capsule (300 mg total) by mouth daily. 180 capsule 1  . glatiramer (COPAXONE) 20 MG/ML SOSY injection USE 1 INJECTION SUBCUTANEOUSLY ONCE A DAY 30 mL 11  . guaiFENesin (MUCINEX) 600 MG 12 hr tablet Take 600 mg by mouth 2 (two) times daily as needed for cough or to loosen phlegm.     . Homeopathic Products (SIMILASAN DRY EYE RELIEF OP) Place 1 drop into both eyes 2 (two) times daily.    [name] ipratropium (ATROVENT) 0.02 % nebulizer solution Take 2.5 mLs (0.5 mg total) by nebulization 4 (four) times daily. 120 mL 12  . LINZESS 290 MCG CAPS capsule Take 290 mcg by mouth daily before breakfast.     . losartan (COZAAR) 25 MG tablet Take 25 mg by mouth 2 (two) times daily.     [name] lovastatin (MEVACOR) 20 MG tablet Take 20 mg by mouth at bedtime.     . metoprolol succinate (TOPROL-XL) 25 MG 24 hr tablet Take 25 mg by mouth daily.     . modafinil (PROVIGIL) 200 MG tablet TAKE 1 TABLET BY MOUTH TWICE A DAY 60 tablet 2  . Multiple Vitamin (MULTIVITAMIN WITH MINERALS) TABS tablet Take 1 tablet by mouth daily.    . nitroGLYCERIN (NITROSTAT) 0.4 MG SL tablet Place 0.4 mg under the tongue every 5 (five) minutes as needed for chest pain.    Gean Birchwood ER 50 MG TB12 Take 50 mg by mouth every 12 (twelve) hours.       . Omega-3 Fatty Acids (OMEGA 3 PO) Take 1 capsule by mouth daily.    [name] oxyCODONE-acetaminophen (PERCOCET) 5-325 MG per tablet Take 1 tablet by mouth every 4 (four) hours as needed for severe pain.     . polyethylene glycol powder (GLYCOLAX/MIRALAX) powder Take 17 g by mouth daily as needed for moderate constipation.     [name] PRISTIQ 50 MG 24 hr tablet Take 50 mg by mouth at bedtime.     [name]  ranitidine (ZANTAC) 300 MG tablet Take 300 mg by mouth at bedtime.    [name] umeclidinium bromide (INCRUSE ELLIPTA) 62.5 MCG/INH AEPB Inhale 1 puff into the lungs daily. 60 each 0  . vitamin B-12 (CYANOCOBALAMIN) 1000 MCG tablet Take 1,500 mcg by mouth daily.    . isosorbide mononitrate (IMDUR) 60 MG 24 hr tablet Take 1.5 tablets (90 mg total) by mouth daily. 135 tablet 3   No facility-administered medications prior to visit.     Review of Systems  Review of Systems  Constitutional: Negative.   HENT: Negative.   Respiratory: Positive for cough and wheezing.        Left pleuritic pain  Cardiovascular: Negative.     Physical Exam  BP 110/70 (BP Location: Right Arm, Patient Position: Sitting, Cuff Size: Normal)   Pulse 89   Ht 5\' 1"  (1.549 m)   Wt 118 lb (53.5 kg)   SpO2 96%   BMI 22.30 kg/m  Physical Exam Vitals signs reviewed.  Constitutional:      Appearance: She is well-developed and normal weight. She is not ill-appearing.  Pulmonary:     Effort: Pulmonary effort is normal.     Breath sounds: Examination of the right-upper field reveals wheezing. Examination of the left-upper field reveals wheezing. Decreased breath sounds and wheezing present. No rhonchi or rales.  Musculoskeletal: Normal range of motion.     Comments: Left rib cage sore to touch  Skin:    Comments: No rash   Neurological:     General: No focal deficit present.     Mental Status: She is alert. She is disoriented.  Psychiatric:        Mood and Affect: Mood is anxious.      Lab Results:  CBC    Component Value  Date/Time   WBC 8.4 09/29/2017 1649   WBC 12.8 (H) 07/25/2015 1439   RBC 3.98 09/29/2017 1649   RBC 3.60 (L) 07/25/2015 1439   HGB 11.9 09/29/2017 1649   HCT 35.5 09/29/2017 1649   PLT 335 09/29/2017 1649   MCV 89 09/29/2017 1649   MCH 29.9 09/29/2017 1649   MCH 30.0 07/25/2015 1439   MCHC 33.5 09/29/2017 1649   MCHC 35.2 07/25/2015 1439   RDW 13.9 09/29/2017 1649   LYMPHSABS 1.8 05/08/2015 1441   MONOABS 0.5 05/08/2015 1441   EOSABS 0.4 05/08/2015 1441   BASOSABS 0.0 05/08/2015 1441    BMET    Component Value Date/Time   NA 130 (L) 06/02/2018 1049   NA 135 09/29/2017 1649   K 3.9 06/02/2018 1049   CL 94 (L) 06/02/2018 1049   CO2 29 06/02/2018 1049   GLUCOSE 83 06/02/2018 1049   BUN 11 06/02/2018 1049   BUN 12 09/29/2017 1649   CREATININE 0.83 06/02/2018 1049   CALCIUM 9.1 06/02/2018 1049   GFRNONAA 73 09/29/2017 1649   GFRAA 84 09/29/2017 1649    BNP No results found for: BNP  ProBNP    Component Value Date/Time   PROBNP 36.0 06/02/2018 1049    Imaging: No results found.   Assessment & Plan:   Pleuritic chest pain - Complains of persistent lateral left pleuritic chest pain x 4-6 weeks - Does not appear cardiac in nature; Exam consistent with muscularskeletal pain - EKG today showed SR with left bundle branch block, unchanged from previous in May 2019  COPD (chronic obstructive pulmonary disease) (HCC) - Chronic cough with wheezing on exam - Resume incruse Ellipta and Atrovent nebulziers four times  daily - RX prednisone 20mg  x 5 days  - CXR 06/02/18 showed no evidence of PNA or CHF. Some chronic bronchitic changes related to smoking. Needs repeat CT chest w/o contrast to further evaluate.   07/14/2018  Spoke with patient and discussed recent CT chest results. Reports pain has improved with prednisone course.  CT chest which showed stable pleural based 15x42mm irregular density to left lung apex. Interval development of cluster small nodules in right  upper lobe which may reflect atypical infection such as mycobacterium. Ordered for sputum culture. Will discuss results with Dr. Chase Caller, findings unlikely to be causing the amount of pain patient is describing.   Martyn Ehrich, NP 07/14/2018

## 2018-07-14 NOTE — Assessment & Plan Note (Addendum)
-   Chronic cough with wheezing on exam - Resume incruse Ellipta and Atrovent nebulziers four times daily - RX prednisone 20mg  x 5 days  - CXR 06/02/18 showed no evidence of PNA or CHF. Some chronic bronchitic changes related to smoking. Needs repeat CT chest w/o contrast to further evaluate.

## 2018-07-14 NOTE — Patient Instructions (Addendum)
Office testing: - EKG   Orders: - CT chest w/o contrast re: cough, abnormal CXR  RX: - Prednisone taper as prescribed   Recommendations: - Resume Incruse Ellipta and Atrovent nebulziers four times daily  Follow-up: - Return to see Dr. Chase Caller if symptoms do not improve or worsen

## 2018-07-14 NOTE — Assessment & Plan Note (Addendum)
-   Complains of persistent lateral left pleuritic chest pain x 4-6 weeks - Does not appear cardiac in nature; Exam consistent with muscularskeletal pain - EKG today showed SR with left bundle branch block, unchanged from previous in May 2019

## 2018-07-15 ENCOUNTER — Other Ambulatory Visit: Payer: Self-pay | Admitting: Neurology

## 2018-07-15 ENCOUNTER — Ambulatory Visit (INDEPENDENT_AMBULATORY_CARE_PROVIDER_SITE_OTHER)
Admission: RE | Admit: 2018-07-15 | Discharge: 2018-07-15 | Disposition: A | Discharge status: Home / Self Care | Payer: Medicare Other | Source: Ambulatory Visit | Attending: Primary Care | Admitting: Primary Care

## 2018-07-15 ENCOUNTER — Telehealth: Payer: Self-pay | Admitting: Neurology

## 2018-07-15 DIAGNOSIS — R0781 Pleurodynia: Secondary | ICD-10-CM | POA: Diagnosis not present

## 2018-07-15 NOTE — Progress Notes (Addendum)
Key: [id number] - PA Case ID: [id number] - Rx #: [id number]    Approved through 01/13/2019

## 2018-07-15 NOTE — Telephone Encounter (Signed)
Patient just called and states that the pharmacy states we have to do a prior auth of the medication

## 2018-07-15 NOTE — Telephone Encounter (Signed)
approved

## 2018-07-15 NOTE — Progress Notes (Signed)
Called pharmacy, spoke with Joellen Jersey and advised her. Called and advised Pt.

## 2018-07-15 NOTE — Telephone Encounter (Signed)
Initiated on Cover My Meds Key: [id number] - PA Case ID: [id number] - Rx #: [id number]

## 2018-07-15 NOTE — Telephone Encounter (Signed)
Patient needs a refill on the Modafinil sent to [place] and she would like to see if we can do a 90 day supply

## 2018-07-16 ENCOUNTER — Telehealth: Payer: Self-pay | Admitting: Primary Care

## 2018-07-16 DIAGNOSIS — J438 Other emphysema: Secondary | ICD-10-CM

## 2018-07-16 DIAGNOSIS — R0600 Dyspnea, unspecified: Secondary | ICD-10-CM

## 2018-07-16 DIAGNOSIS — R0689 Other abnormalities of breathing: Principal | ICD-10-CM

## 2018-07-16 NOTE — Telephone Encounter (Signed)
Pt's husband given specimen cups; as stated below, pt needs to provide sputum sample.  This order had already been placed.  Nothing further needed at this time- will close encounter.

## 2018-07-16 NOTE — Telephone Encounter (Signed)
Agree with sputum but make sure is AFB  Recommend autoimune panel - ANA, DSDNA, RF, CCP, SCL70, ssA, SsBP, ESR, and vasculitis - MPO, PR3 - > to see if this can explain serositis

## 2018-07-16 NOTE — Telephone Encounter (Signed)
Hi Dr. Chase Caller,  I saw this patient of yours in clinic twice for left pleuritic pain and associated cough. CXR obtained showing chronic bronchitis smoking related changes. EKG and LFTs normal. I ordered CT chest which showed stable pleural based 15x14mm irregular density to left lung apex. Interval development of cluster small nodules in right upper lobe which may reflect atypical infection such as mycobacterium.  I am asking patient to provide sputum sample. Do you mind looking at CT scan and let me know if you would do anything additional? Would you expect findings to be causing her pain?  Thanks, [employer]

## 2018-07-16 NOTE — Telephone Encounter (Signed)
[name] has spoken with the pt about her results. Orders have been placed for sputum, AFB and fungal cultures. Nothing further was needed.

## 2018-07-16 NOTE — Telephone Encounter (Addendum)
Tried to call patient, no VM, no answer. X1 Need to let patient know to come in office for labs as requested. Keeping message in triage until we can speak with patient.

## 2018-07-16 NOTE — Telephone Encounter (Signed)
Pt is requesting her CT results from 07/15/2018.  Beth - please advise. Thanks.

## 2018-07-16 NOTE — Telephone Encounter (Signed)
Just sent result note to triage. I'll call her, can you order sputum cultures on her for me???

## 2018-07-16 NOTE — Telephone Encounter (Signed)
I have [place]'s husband heree to pick up something for her, I have no idea what it is and neither does he he is in lobby.Hillery Hunter'

## 2018-07-17 ENCOUNTER — Other Ambulatory Visit (INDEPENDENT_AMBULATORY_CARE_PROVIDER_SITE_OTHER): Payer: Medicare Other

## 2018-07-17 ENCOUNTER — Telehealth: Payer: Self-pay | Admitting: Internal Medicine

## 2018-07-17 DIAGNOSIS — R0689 Other abnormalities of breathing: Secondary | ICD-10-CM | POA: Diagnosis not present

## 2018-07-17 DIAGNOSIS — R0600 Dyspnea, unspecified: Secondary | ICD-10-CM | POA: Diagnosis not present

## 2018-07-17 LAB — SEDIMENTATION RATE: Sed Rate: 35 mm/hr — ABNORMAL HIGH (ref 0–30)

## 2018-07-17 NOTE — Telephone Encounter (Signed)
Called and spoke with patient regarding MR recommendations below Pt is coming by office today around 1pm to have sputum dropped off Advised pt that MR is recommending labs to be drawn today while she is in office Pt verbalized understanding had no further concerns nor questions Placed labs; autoimune panel - ANA, DSDNA, RF, CCP, SCL70, ssA, SsBP, ESR, and vasculitis - MPO, PR3 - Nothing further needed.

## 2018-07-17 NOTE — Telephone Encounter (Signed)
Patient called requesting CT Scan results//fmb

## 2018-07-17 NOTE — Addendum Note (Signed)
Addended by: Georjean Mode on: 07/17/2018 09:36 AM   Modules accepted: Orders

## 2018-07-17 NOTE — Telephone Encounter (Signed)
Called and spoke with patient in regards to CT scan completed on 07/15/2018 Advised pt that it takes couple days for the results of the CT scan Advised pt that we will call her with the results She verbalized understanding Nothing further needed.

## 2018-07-20 ENCOUNTER — Telehealth: Payer: Self-pay | Admitting: Internal Medicine

## 2018-07-20 LAB — ANA: Anti Nuclear Antibody(ANA): NEGATIVE

## 2018-07-20 LAB — MPO/PR-3 (ANCA) ANTIBODIES: Myeloperoxidase Abs: <1 AI

## 2018-07-20 LAB — SJOGRENS SYNDROME-A EXTRACTABLE NUCLEAR ANTIBODY: SSA (RO) (ENA) ANTIBODY, IGG: NEGATIVE AI

## 2018-07-20 LAB — ANTI-SCLERODERMA ANTIBODY: SCLERODERMA (SCL-70) (ENA) ANTIBODY, IGG: NEGATIVE AI

## 2018-07-20 LAB — CYCLIC CITRUL PEPTIDE ANTIBODY, IGG

## 2018-07-20 LAB — SJOGRENS SYNDROME-B EXTRACTABLE NUCLEAR ANTIBODY: SSB (La) (ENA) Antibody, IgG: <1 AI

## 2018-07-20 LAB — RHEUMATOID FACTOR: Rhuematoid fact SerPl-aCnc: <14 IU/mL (ref <14)

## 2018-07-20 NOTE — Telephone Encounter (Signed)
Patient is feeling better. Cough has improve, however, she is unable to produce sputum since being on prednisone. Answered all her questions. Labs were drawn on Friday, some still pending. She will call to make a follow-up apt with Dr. Chase Caller

## 2018-07-20 NOTE — Telephone Encounter (Signed)
Call made to patient, she is requesting Chest CT results.   Notes recorded by Martyn Ehrich, NP on 07/16/2018 at 9:36 AM: Please let patient know CT showed scarring left base, this should not be causing her pain but I will discuss with Dr. Chase Caller. It did show some new scattered nodules in right upper lung possible atypical infection. I would like to check a sputum sample (all three). Please ask patient to provide and we will follow-up on results.   These results were explained to this patient. Patient became very discouraged stating she just cant take anymore. Accolades and encouragement was given and patient was in better spirits upon termination of phone call. She is wanting to know what the next step will be. She states she would like a call from EW when she is available.   EW please advise. Thanks.

## 2018-07-21 LAB — AUTOIMMUNE PROFILE
ANA: NEGATIVE
COMPLEMENT C3, SERUM: 117 mg/dL (ref 82–167)

## 2018-07-21 LAB — ANA+ENA+DNA/DS+SCL 70+SJOSSA/B
ANA Titer 1: NEGATIVE
ENA SM Ab Ser-aCnc: <0.2 AI (ref 0.0–0.9)
ENA SSA (RO) Ab: <0.2 AI (ref 0.0–0.9)
Scleroderma (Scl-70) (ENA) Antibody, IgG: 0.3 AI (ref 0.0–0.9)

## 2018-07-23 ENCOUNTER — Other Ambulatory Visit: Payer: Self-pay

## 2018-07-23 ENCOUNTER — Encounter: Payer: Self-pay | Admitting: Neurology

## 2018-07-23 ENCOUNTER — Ambulatory Visit: Payer: Medicare Other | Admitting: Neurology

## 2018-07-23 VITALS — BP 108/64 | HR 100 | Ht 61.0 in | Wt 124.0 lb

## 2018-07-23 DIAGNOSIS — G35 Multiple sclerosis: Secondary | ICD-10-CM | POA: Diagnosis not present

## 2018-07-23 NOTE — Patient Instructions (Signed)
1.  Restart Copaxone 2.  Follow up in 6 months

## 2018-07-23 NOTE — Progress Notes (Signed)
NEUROLOGY FOLLOW UP OFFICE NOTE  [name] [id number]  HISTORY OF PRESENT ILLNESS: [name] is a 73 year old right-handed female with CAD, COPD, emphysema, CHF, thyroid disease, IBS, arthritis, depression, fibromyalgia who follows up for MS.  UPDATE: Current medications: Gabapentin 100mg  at bedtime (300mg  causes drowsiness) Clonazepam 1mg  during day and 2mg  at bedtime for spasms D3 4000 IU daily Provigil 200mg   She stopped Copaxone.  She is depressed and upset.  She is concerned that she only has up to 2 1/2 years to live due to her heart failure.  She has mentioned this to me in the past.  I reviewed her last cardiology note in December where she mentions normal echo and cardiac catheterization without obstruction.  She does not mention anything about poor prognosis and encouraged Ms. Meaney to exercise.  Vision:  Permanent vision loss in left eye Motor:  Stable.  Intermittent head shaking Sensory:  stable Pain:  Pain in arms and legs.  Lumbar radiculopathy Gait:  Sometimes requires ambulation with cane or walker.   Bowel/Bladder:  Stable Fatigue:  Yes Cognition:  Short-term memory deficits. Mood:  Depressed. She reports difficulty with speech.  She has trouble with pronouncing them.  It has been ongoing for several months but has gotten worse.    HISTORY: First symptom occurred in 1995, when she developed vision loss in the left eye, as well as left eye pain.MRI of the brain with and without contrast was performed on 03/28/95 revealed 10-12 periventricular white matter lesions and one lesion in the corpus callosum, suspicious for demyelination.There was also enhancement of the left optic nerve.She was referred to Dr. Delphia Grates, an MS specialist.Lumbar puncture was performed, revealing no oligoclonal bands or myelin basic protein.She has not required hospitalization or steroids since the late 1990s.She was on Betaseron and later Copaxone.Copaxone was  discontinued due to longstanding stability, but due to fatigue and cognitive issues, it was restarted in 2014.Her continued symptoms are episodes of worsening fatigue, as well as cognitive issues.Sometimes, she has difficulty thinking clearly.She will get disoriented while driving.Other times, she has word-finding difficulties.She once overpaid a bill.She still performs all ADLs, including paying the bills.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  Past disease-modifying agents:Betaseron (elevated LFTs and necrotic injection sites)  For fatigue, she takes Provigil.Adderal was ineffective.Amantadine was ineffective. For pain in the arms and legs, she takes gabapentin 300mg  at bedtime.She reportedly has lumbar radiculopathy which also contributes to the leg pain. Initially, she reportedly had significant spasticity.She was on oral baclofen which did not help.At one point, a baclofen pump was entertained.She currently takes clonazepam 1mg  during the day and 2mg  at bedtime, which is effective.  Other imaging: 05/07/95 MRI LUMBAR XF:GHWE disc bulging at L4-5 without disc herniation or central canal stenosis. 03/09/96 MRI BRAIN W/WO (comparing to MRI from 03/28/95):slight increase in number of white matter lesions adjacent to the atrium of the right lateral ventricle, otherwise unchanged. 03/28/98 MRI BRAIN W/WO (comparing to MRI from 03/25/97):several subtle areas of increased signal in the deep white matter, not as prevalent as on prior study.No abnormal enhancement. 08/11/13 MRI of brain with and without contrast :multiple supratentorial and infratentorial T2 hyperintensities, but no post-contrast enhancement.However, this was not compared to prior imaging. 01/28/14 MRI Cervical spine without contrast:spinal cord  unremarkable.[name] MRI of brain with and without contrast from 08/17/14 is stable  MRI of brain without contrast from 05/08/15 is stable. MRI of brain with and without contrast from 03/04/17 unchanged from prior study on 05/08/15.  PAST MEDICAL HISTORY: Past Medical History:  Diagnosis Date  . Anxiety   . Aortic atherosclerosis ([place]) 09/29/2017   High Res CT in 9/18: aortic atherosclerosis; coronary artery calcification  . Cervical disc disease    BULGING  . Chronic combined systolic and diastolic CHF (congestive heart failure) ([place]) 09/29/2017   Echo 7/18: Moderate concentric LVH, grade 1 diastolic dysfunction, abnormal septal wall motion due to LBBB, moderate diffuse HK, EF 35-40, atrial septal aneurysm with possible PFO, mild TR // Echo 5/19: EF 50-55, normal wall motion, grade 1 diastolic dysfunction, trivial TR  . Complication of anesthesia    SLOW TO AWAKEN AFTER LAST COLONSCOPY  . Coronary artery disease 05/09/2015   LHC 10/12: EF 55, OM1 80-90 - difficult to engage >> treated medically // Nuc 8/18: EF 40, no ischemia  . Depression   . DJD (degenerative joint disease)    OA  . Dysrhythmia    Not clear where this diagnosis came from  . Emphysema of lung ([place])    Dr. Chase Caller  . Fibromyalgia   . H/O: liver disease 43 YRS AGO   tranamanitis due to previous interferon - LFTs improved off of  . History of MI (myocardial infarction) FEW YRS AGO  . History of TIA (transient ischemic attack)   . Hx of colonic polyps   . Hyperlipidemia    intol to statin - myalgias // ESPERION trial - patient stopped  . Multiple sclerosis ([place])   . Narcotic dependence ([facility])    hx of benzodiazepine and narcotic  . Osteoporosis   . Positive H. pylori test   . Urinary incontinence     MEDICATIONS: Current Outpatient Medications on File Prior to Visit  Medication Sig Dispense Refill  . albuterol (PROAIR HFA) 108 (90 Base) MCG/ACT inhaler Inhale 2 puffs into the lungs every 6 (six) hours as needed for  wheezing or shortness of breath. 1 Inhaler 5  . Ascorbic Acid (VITAMIN C) 1000 MG tablet Take 1,000 mg by mouth daily.    [name] aspirin EC 81 MG tablet Take 162 mg by mouth daily.    . Cholecalciferol (VITAMIN D3 PO) Take 1 capsule by mouth daily.     . clonazePAM (KLONOPIN) 1 MG tablet Take 1 mg by mouth as directed.    [name] COENZYME Q10 PO Take 1 capsule by mouth 2 (two) times daily.     . diphenhydrAMINE (BENADRYL) 50 MG capsule Take 50 mg by mouth as directed.    . doxepin (SINEQUAN) 25 MG capsule Take 1 capsule (25 mg total) by mouth at bedtime. 30 capsule 5  . EVENING PRIMROSE OIL PO Take 2 capsules by mouth 2 (two) times daily.    [name] gabapentin (NEURONTIN) 100 MG capsule Take 100-300 mg by mouth See admin instructions. Take 100 mg by mouth daily and take 300 mg by mouth at bedtime    . gabapentin (NEURONTIN) 300 MG capsule Take 1 capsule (300 mg total) by mouth daily. 180 capsule 1  . glatiramer (COPAXONE) 20 MG/ML SOSY injection USE 1 INJECTION SUBCUTANEOUSLY ONCE A DAY 30 mL 11  . guaiFENesin (MUCINEX) 600 MG 12 hr tablet Take 600 mg by mouth 2 (two) times daily as needed for cough or to loosen phlegm.     . Homeopathic Products (SIMILASAN DRY EYE RELIEF OP) Place 1 drop into both eyes 2 (  two) times daily.    [name] ipratropium (ATROVENT) 0.02 % nebulizer solution Take 2.5 mLs (0.5 mg total) by nebulization 4 (four) times daily. 120 mL 12  . isosorbide mononitrate (IMDUR) 60 MG 24 hr tablet Take 1.5 tablets (90 mg total) by mouth daily. 135 tablet 3  . LINZESS 290 MCG CAPS capsule Take 290 mcg by mouth daily before breakfast.     . losartan (COZAAR) 25 MG tablet Take 25 mg by mouth 2 (two) times daily.     [name] lovastatin (MEVACOR) 20 MG tablet Take 20 mg by mouth at bedtime.     . metoprolol succinate (TOPROL-XL) 25 MG 24 hr tablet Take 25 mg by mouth daily.     . modafinil (PROVIGIL) 200 MG tablet TAKE 1 TABLET BY MOUTH TWICE A DAY 60 tablet 5  . Multiple Vitamin (MULTIVITAMIN WITH MINERALS) TABS  tablet Take 1 tablet by mouth daily.    . nitroGLYCERIN (NITROSTAT) 0.4 MG SL tablet Place 0.4 mg under the tongue every 5 (five) minutes as needed for chest pain.    Gean Birchwood ER 50 MG TB12 Take 50 mg by mouth every 12 (twelve) hours.     . Omega-3 Fatty Acids (OMEGA 3 PO) Take 1 capsule by mouth daily.    [name] oxyCODONE-acetaminophen (PERCOCET) 5-325 MG per tablet Take 1 tablet by mouth every 4 (four) hours as needed for severe pain.     . polyethylene glycol powder (GLYCOLAX/MIRALAX) powder Take 17 g by mouth daily as needed for moderate constipation.     . predniSONE (DELTASONE) 10 MG tablet Take 2 tabs x 5 days 10 tablet 0  . PRISTIQ 50 MG 24 hr tablet Take 50 mg by mouth at bedtime.     . ranitidine (ZANTAC) 300 MG tablet Take 300 mg by mouth at bedtime.    [name] umeclidinium bromide (INCRUSE ELLIPTA) 62.5 MCG/INH AEPB Inhale 1 puff into the lungs daily. 60 each 0  . vitamin B-12 (CYANOCOBALAMIN) 1000 MCG tablet Take 1,500 mcg by mouth daily.     No current facility-administered medications on file prior to visit.     ALLERGIES: Allergies  Allergen Reactions  . Crestor [Rosuvastatin] Hives and Other (See Comments)    welps  . Sulfonamide Derivatives Hives    As a child  . Contrast Media [Iodinated Diagnostic Agents] Rash and Other (See Comments)    Severe rash    FAMILY HISTORY: Family History  Problem Relation Age of Onset  . Heart attack Mother 5  . Heart attack Father 18  . Cancer Brother    SOCIAL HISTORY: Social History   Socioeconomic History  . Marital status: Divorced    Spouse name: Not on file  . Number of children: 2  . Years of education: 12th  . Highest education level: Not on file  Occupational History  . Occupation: disability    Employer: RETIRED  Social Needs  . Financial resource strain: Not on file  . Food insecurity:    Worry: Not on file    Inability: Not on file  . Transportation needs:    Medical: Not on file    Non-medical: Not on file    Tobacco Use  . Smoking status: Former Smoker    Packs/day: 1.00    Years: 48.00    Pack years: 48.00    Types: Cigarettes    Last attempt to quit: 11/16/2009    Years since quitting: 8.6  . Smokeless tobacco: Never Used  Substance and Sexual  Activity  . Alcohol use: No  . Drug use: No  . Sexual activity: Not on file  Lifestyle  . Physical activity:    Days per week: Not on file    Minutes per session: Not on file  . Stress: Not on file  Relationships  . Social connections:    Talks on phone: Not on file    Gets together: Not on file    Attends religious service: Not on file    Active member of club or organization: Not on file    Attends meetings of clubs or organizations: Not on file    Relationship status: Not on file  . Intimate partner violence:    Fear of current or ex partner: Not on file    Emotionally abused: Not on file    Physically abused: Not on file    Forced sexual activity: Not on file  Other Topics Concern  . Not on file  Social History Narrative   Patient lives at home alone. - [place], [place]   Caffeine Use: rarely   Retired - Used to be a Dance movement psychotherapist; owned a Company secretary   Married; 2 children; 1 granddaughter    REVIEW OF SYSTEMS: Constitutional: No fevers, chills, or sweats, no generalized fatigue, change in appetite Eyes: No visual changes, double vision, eye pain Ear, nose and throat: No hearing loss, ear pain, nasal congestion, sore throat Cardiovascular: sometimes atypical chest pain Respiratory:  Some wheezes GastrointestinaI: No nausea, vomiting, diarrhea, abdominal pain, fecal incontinence Genitourinary:  No dysuria, urinary retention or frequency Musculoskeletal:  No neck pain, back pain Integumentary: No rash, pruritus, skin lesions Neurological: as above Psychiatric: depression, anxiety Endocrine: No palpitations, fatigue, diaphoresis, mood swings, change in appetite, change in weight, increased thirst Hematologic/Lymphatic:   No purpura, petechiae. Allergic/Immunologic: no itchy/runny eyes, nasal congestion, recent allergic reactions, rashes  PHYSICAL EXAM: Blood pressure 108/64, pulse 100, height 5\' 1"  (1.549 m), weight 124 lb (56.2 kg), SpO2 97 %. General: No acute distress.  Patient appears well-groomed.    Head:  Normocephalic/atraumatic Eyes:  Fundi examined but not visualized Neck: supple, no paraspinal tenderness, full range of motion Heart:  Regular rate and rhythm Lungs:  Clear to auscultation bilaterally Back: No paraspinal tenderness Neurological Exam: .  Alert and oriented to person, place, and time.  Attention span and concentration intact.  Recent and remote memory intact.  Fund of knowledge intact.  Speech fluent and not dysarthric.  Language intact.  Left monocular vision loss.  Otherwise, CN II-XII intact.  Bulk and tone normal.  Muscle strength 5/5 throughout.  Sensation to light touch intact.  Deep tendon reflexes 2+ throughout.  Finger-to-nose testing intact.  Unsteady gait.  Romberg with sway.  IMPRESSION: Multiple sclerosis. I reviewed the cardiology note with Ms. Ransom.  PLAN: 1.  She says she is agreeable to restarting Copaxone. 2.  She will continue gabapentin and vitamin D. 3.  Follow-up in 6 months.  25 minutes spent with the patient, over 50% spent discussing cardiology note and management.  Metta Clines, DO  CC: Donald Prose, MD

## 2018-07-24 ENCOUNTER — Other Ambulatory Visit: Payer: Self-pay

## 2018-07-24 ENCOUNTER — Other Ambulatory Visit: Payer: Medicare Other

## 2018-07-24 NOTE — Addendum Note (Signed)
Addended by: Suzzanne Cloud E on: 07/24/2018 02:09 PM   Modules accepted: Orders

## 2018-07-27 ENCOUNTER — Telehealth: Payer: Self-pay | Admitting: Internal Medicine

## 2018-07-27 NOTE — Telephone Encounter (Signed)
Spoke with pt, she saw her lab results on mychart and wanted to know the results. I advised her that I would send this message to MR so he can review results and then get back to her. MR please advise.

## 2018-07-28 MED ORDER — CIPROFLOXACIN HCL 500 MG PO TABS: 500.0000 mg | ORAL_TABLET | Freq: Two times a day (BID) | ORAL | 0 refills | Status: DC

## 2018-07-28 NOTE — Telephone Encounter (Signed)
Sputum grew out pseudomonas aeruginosa. Will sent in RX ciprofloxacin.

## 2018-07-29 NOTE — Telephone Encounter (Signed)
Called [phone] busy.  Called [phone], it rang and then went dead.  Will try later.

## 2018-07-29 NOTE — Telephone Encounter (Signed)
Patient returned call Discussed sputum sample results as stated by [facility] NP Patient voiced understanding Nothing further needed; will sign off

## 2018-07-30 NOTE — Telephone Encounter (Signed)
Autoimmune profile from mid -feb 2020 also normal

## 2018-08-04 ENCOUNTER — Ambulatory Visit: Payer: Medicare Other | Admitting: Internal Medicine

## 2018-08-04 ENCOUNTER — Ambulatory Visit: Payer: Self-pay | Admitting: Internal Medicine

## 2018-08-04 ENCOUNTER — Encounter: Payer: Self-pay | Admitting: Internal Medicine

## 2018-08-04 VITALS — BP 122/68 | HR 86 | Ht 61.0 in | Wt 125.0 lb

## 2018-08-04 DIAGNOSIS — J438 Other emphysema: Secondary | ICD-10-CM

## 2018-08-04 DIAGNOSIS — R918 Other nonspecific abnormal finding of lung field: Secondary | ICD-10-CM

## 2018-08-04 DIAGNOSIS — R0781 Pleurodynia: Secondary | ICD-10-CM

## 2018-08-04 MED ORDER — UMECLIDINIUM BROMIDE 62.5 MCG/INH IN AEPB: 1.0000 | INHALATION_SPRAY | Freq: Every day | RESPIRATORY_TRACT | 0 refills | Status: DC

## 2018-08-04 NOTE — Patient Instructions (Addendum)
Other emphysema ([place])  - Resume incruse daily scheduled and do nebulizer as needed  - compliance important  Pleuritic pain - glad resolved. Autoimmune workup negative   Pulmonary nodules - small right upper lobe  -rpeat CT chest without contrast in 9 months   Followup - 9 months or sooner if needed; CAT score

## 2018-08-04 NOTE — Progress Notes (Signed)
_0  ID: [name], female    DOB: 11/10/1945, 73 y.o.   MRN: [id number]  Chief Complaint  Patient presents with  . Follow-up    Emphysema     Referring provider: Donald Prose, MD  HPI: 73 year old female former smoker followed for emphysema, and lung nodule  TEST  High-resolution CT chest September 2018 showed 16 mm left upper lobe nodule unchanged dating back to September 2014 consistent with benign etiology, mild emphysema   12/01/2017 Follow up : Emphysema  Patient returns for a 74-monthfollow-up.  Patient was seen last visit.  She was started on ipratropium nebulizers twice daily.  Says that this has really helped her breathing.  She feels less short of breath.  She has been able to be more active. Feels that this is the best she is done in a long time.  She is actually able to get out and do things.  She is very happy with this.  She is now able to walk her dog. PFT today showed normal lung function with FEV1 at 109%, ratio 82, FVC 100%, DLCO 87%.  OV 05/14/2018  Subjective:  Patient ID: [name] female , DOB: 806/15/1947, age 73y.o. , MRN: [id number], [address] [id number]  05/14/2018 -   Chief Complaint  Patient presents with  . Follow-up    pt reports of sob with exertion & chest discomfort.    + HPI Audrey KINGTON73y.o. -presents for follow-up of emphysema associated with bilateral lower lobe crackles.  No evidence of ILD on September 2018 CT chest.  Overall she is stable.  COPD CAT score is only 10.  She is on some kind of a bronchodilator regimen and she is not fully compliant with it.  She says she will step up her compliance.  She says she is up-to-date with the flu shot but we do not have a date on this.  She says she is depressed because it is Christmas time.  She also thinks her cardiologist gave her a bad prognosis.  She does not know what the diseases.      Patient ID: [name] female    DOB:  809/24/1947 73y.o.   MRN: [id number] Chief Complaint  Patient presents with  . Chest Pain    Left sided ribs have been hurting for the past few days. Thinks she may have cracked a rib with a recent cold.     Referring provider: SDonald Prose MD  HPI: 73year old female, former smoker quit 2011(48 pack year hx). PMH COPD, fibromyalgia, MS, heart failure. Patient of Dr. RChase [name] last see by NP on 06/02/18. CXR showed no evidence of PNA or CHF. Some chronic bronchitic changes related to smoking. HRCT in 2018 showed no significant findings except for emphysema.   07/14/2018 Patient presents today for acute visit with continued complaints of left pleuritic pain. Reports that she had a bad cough which has slowly improved. Left rib cage is sore to touch and skin burns. No rash. Takes percocet for chronic pain/fibromyalgia, unsure if it helps. LFTs were normal. Ordered for CT abd but patient cancelled d/t cough. She has not been taking incruse inhaler or Atrovent nebulizer as often as it is prescribed. EKG today showed SR with left bundle branch block, unchanged from previous in May 2019. Denies shortness of breath.    OV 08/04/2018  Subjective:  Patient ID: [name] female ,  DOB: 02-24-1946 , age 73 y.o. , MRN: [id number] , ADDRESS: [address] [place] [facility] [address]   08/04/2018 -   Chief Complaint  Patient presents with  . Follow-up    Pt states she is still taking abx after recent OV with Audrey Barrow, NP. Pt denies any current complaints of SOB but states she has had an occ cough. Pt states she still has been having problems with pain in her chest.     HPI [name] 73 y.o. -presents for follow-up.  She has COPD and fibromyalgia.  She is a Designer, jewellery recently and had multitude of complaints including pleuritic chest pain on the left side.  This resulted in a CT scan of the chest and also autoimmune work-up which were negative.  The CT scan shows right upper lobe  scattered pulmonary nodules are extremely small.  This on the contralateral side to the pain.  Today she tells me that the left-sided pleuritic pain is resolved.  She says since that she is having mood issues.  She also is constantly shaking her head which she says is a chronic intermittent issue.  Last visit nurse practitioner gave her Incruse inhaler but she has no idea that this was even dispensed to her prescribed for her.  She thinks our office felt short in sending the prescription to the pharmacy.  Instead she is using albuterol as needed.  COPD CAT score is minimal and symptom score is documented below.     CAT COPD Symptom & Quality of Life Score (GSK trademark) 0 is no burden. 5 is highest burden 05/14/2018 0 08/04/2018   Never Cough -> Cough all the time 0 0  No phlegm in chest -> Chest is full of phlegm 0 0  No chest tightness -> Chest feels very tight 0 2  No dyspnea for 1 flight stairs/hill -> Very dyspneic for 1 flight of stairs 3 0  No limitations for ADL at home -> Very limited with ADL at home 3 5  Confident leaving home -> Not at all confident leaving home 0 3  Sleep soundly -> Do not sleep soundly because of lung condition 0 0  Lots of Energy -> No energy at all 5 4  TOTAL Score (max 40)  10 11    IMPRESSION: Interval development of cluster of small nodules seen in the lateral portion of right upper lobe concerning for atypical infection such as mycobacterium, or chronic sequela of previous such infection. Clinical correlation is recommended.  Stable pleural-based irregular density noted in left lung apex most consistent with benign scarring.  Minimal bilateral posterior basilar subsegmental atelectasis or scarring is noted.  Mild coronary artery calcifications are noted.  Aortic Atherosclerosis (ICD10-I70.0).   Electronically Signed   By: [name], M.D.   On: 07/15/2018 16:05   [name] (MRN [id number]) as of 08/04/2018  12:46  Ref. Range 07/17/2018 12:08 07/17/2018 12:08  Anti Nuclear Antibody(ANA) Latest Ref Range: Negative  NEGATIVE Negative  ANA Titer 1 Unknown  Negative  Cyclic Citrullin Peptide Ab Latest Units: UNITS <16   dsDNA Ab Latest Ref Range: 0 - 9 IU/mL  <1  ENA RNP Ab Latest Ref Range: 0.0 - 0.9 AI  <0.2  ENA SSA (RO) Ab Latest Ref Range: 0.0 - 0.9 AI  <0.2  ENA SSB (LA) Ab Latest Ref Range: 0.0 - 0.9 AI  <0.2  Myeloperoxidase Abs Latest Units: AI <1.0   Serine Protease 3 Latest  Units: AI <1.0   RA Latex Turbid. Latest Ref Range: <14 IU/mL <14   ENA SM Ab Ser-aCnc Latest Ref Range: 0.0 - 0.9 AI  <0.2  Complement C3, Serum Latest Ref Range: 82 - 167 mg/dL  117  SSA (Ro) (ENA) Antibody, IgG Latest Ref Range: <1.0 NEG AI <1.0 NEG   SSB (La) (ENA) Antibody, IgG Latest Ref Range: <1.0 NEG AI <1.0 NEG   Scleroderma (Scl-70) (ENA) Antibody, IgG Latest Ref Range: 0.0 - 0.9 AI <1.0 NEG 0.3   ROS - per HPI     has a past medical history of Anxiety, Aortic atherosclerosis (HCC) (09/29/2017), Cervical disc disease, Chronic combined systolic and diastolic CHF (congestive heart failure) (HCC) (62/70/3500), Complication of anesthesia, Coronary artery disease (05/09/2015), Depression, DJD (degenerative joint disease), Dysrhythmia, Emphysema of lung ([place]), Fibromyalgia, H/O: liver disease (18 YRS AGO), History of MI (myocardial infarction) (FEW YRS AGO), History of TIA (transient ischemic attack), colonic polyps, Hyperlipidemia, Multiple sclerosis ([place]), Narcotic dependence ([place]), Osteoporosis, Positive H. pylori test, and Urinary incontinence.   reports that she quit smoking about 8 years ago. Her smoking use included cigarettes. She has a 48.00 pack-year smoking history. She has never used smokeless tobacco.  Past Surgical History:  Procedure Laterality Date  . BACK SURGERY  11-2009   LOWER BACK  . BOTOX INJECTION N/A 03/21/2016   Procedure: BOTOX INJECTION;  Surgeon: Wilford Corner, MD;  Location: [facility]  ENDOSCOPY;  Service: Endoscopy;  Laterality: N/A;  . CARDIAC CATHETERIZATION     UNSUCCESSFUL STENT PLACEMENT  . COLONSCOPY    . ESOPHAGEAL MANOMETRY N/A 10/23/2015   Procedure: ESOPHAGEAL MANOMETRY (EM);  Surgeon: Wilford Corner, MD;  Location: [facility] ENDOSCOPY;  Service: Endoscopy;  Laterality: N/A;  . ESOPHAGOGASTRODUODENOSCOPY (EGD) WITH PROPOFOL N/A 03/21/2016   Procedure: ESOPHAGOGASTRODUODENOSCOPY (EGD) WITH PROPOFOL;  Surgeon: Wilford Corner, MD;  Location: [facility] ENDOSCOPY;  Service: Endoscopy;  Laterality: N/A;  . RIGHT/LEFT HEART CATH AND CORONARY ANGIOGRAPHY N/A 10/02/2017   Procedure: RIGHT/LEFT HEART CATH AND CORONARY ANGIOGRAPHY;  Surgeon: Nelva Bush, MD;  Location: [facility];  Service: Cardiovascular;  Laterality: N/A;  . TUBAL LIGATION      Allergies  Allergen Reactions  . Crestor [Rosuvastatin] Hives and Other (See Comments)    welps  . Sulfonamide Derivatives Hives    As a child  . Contrast Media [Iodinated Diagnostic Agents] Rash and Other (See Comments)    Severe rash    Immunization History  Administered Date(s) Administered  . Influenza Split 02/20/2011, 02/20/2012, 03/03/2014  . Influenza Whole 02/17/2017  . Influenza, High Dose Seasonal PF 02/09/2018  . Influenza,inj,Quad PF,6+ Mos 02/19/2013  . Influenza-Unspecified 02/04/2017  . Pneumococcal Conjugate-13 02/05/2017  . Pneumococcal Polysaccharide-23 06/01/2012, 03/03/2014  . Td 06/03/2002  . Tdap 11/10/2012    Family History  Problem Relation Age of Onset  . Heart attack Mother 13  . Heart attack Father 72  . Cancer Brother      Current Outpatient Medications:  .  albuterol (PROAIR HFA) 108 (90 Base) MCG/ACT inhaler, Inhale 2 puffs into the lungs every 6 (six) hours as needed for wheezing or shortness of breath., Disp: 1 Inhaler, Rfl: 5 .  Ascorbic Acid (VITAMIN C) 1000 MG tablet, Take 1,000 mg by mouth daily., Disp: , Rfl:  .  aspirin EC 81 MG tablet, Take 162 mg by mouth daily., Disp:  , Rfl:  .  Cholecalciferol (VITAMIN D3 PO), Take 1 capsule by mouth daily. , Disp: , Rfl:  .  ciprofloxacin (CIPRO)  500 MG tablet, Take 1 tablet (500 mg total) by mouth 2 (two) times daily., Disp: 20 tablet, Rfl: 0 .  clonazePAM (KLONOPIN) 1 MG tablet, Take 1 mg by mouth as directed., Disp: , Rfl:  .  doxepin (SINEQUAN) 25 MG capsule, Take 1 capsule (25 mg total) by mouth at bedtime., Disp: 30 capsule, Rfl: 5 .  EVENING PRIMROSE OIL PO, Take 2 capsules by mouth 2 (two) times daily., Disp: , Rfl:  .  gabapentin (NEURONTIN) 100 MG capsule, Take 100-300 mg by mouth See admin instructions. Take 100 mg by mouth daily and take 300 mg by mouth at bedtime, Disp: , Rfl:  .  gabapentin (NEURONTIN) 300 MG capsule, Take 1 capsule (300 mg total) by mouth daily., Disp: 180 capsule, Rfl: 1 .  Homeopathic Products (SIMILASAN DRY EYE RELIEF OP), Place 1 drop into both eyes 2 (two) times daily., Disp: , Rfl:  .  ipratropium (ATROVENT) 0.02 % nebulizer solution, Take 2.5 mLs (0.5 mg total) by nebulization 4 (four) times daily., Disp: 120 mL, Rfl: 12 .  LINZESS 290 MCG CAPS capsule, Take 290 mcg by mouth daily before breakfast. , Disp: , Rfl:  .  losartan (COZAAR) 25 MG tablet, Take 25 mg by mouth 2 (two) times daily. , Disp: , Rfl:  .  lovastatin (MEVACOR) 20 MG tablet, Take 20 mg by mouth at bedtime. , Disp: , Rfl:  .  modafinil (PROVIGIL) 200 MG tablet, TAKE 1 TABLET BY MOUTH TWICE A DAY, Disp: 60 tablet, Rfl: 5 .  Multiple Vitamin (MULTIVITAMIN WITH MINERALS) TABS tablet, Take 1 tablet by mouth daily., Disp: , Rfl:  .  NUCYNTA ER 50 MG TB12, Take 50 mg by mouth every 12 (twelve) hours. , Disp: , Rfl:  .  Omega-3 Fatty Acids (OMEGA 3 PO), Take 1 capsule by mouth daily., Disp: , Rfl:  .  oxyCODONE-acetaminophen (PERCOCET) 5-325 MG per tablet, Take 1 tablet by mouth every 4 (four) hours as needed for severe pain. , Disp: , Rfl:  .  polyethylene glycol powder (GLYCOLAX/MIRALAX) powder, Take 17 g by mouth daily as  needed for moderate constipation. , Disp: , Rfl:  .  PRISTIQ 50 MG 24 hr tablet, Take 50 mg by mouth at bedtime. , Disp: , Rfl:  .  ranitidine (ZANTAC) 300 MG tablet, Take 300 mg by mouth at bedtime., Disp: , Rfl:  .  vitamin B-12 (CYANOCOBALAMIN) 1000 MCG tablet, Take 1,500 mcg by mouth daily., Disp: , Rfl:  .  COENZYME Q10 PO, Take 1 capsule by mouth 2 (two) times daily. , Disp: , Rfl:  .  diphenhydrAMINE (BENADRYL) 50 MG capsule, Take 50 mg by mouth as needed. , Disp: , Rfl:  .  guaiFENesin (MUCINEX) 600 MG 12 hr tablet, Take 600 mg by mouth 2 (two) times daily as needed for cough or to loosen phlegm. , Disp: , Rfl:  .  isosorbide mononitrate (IMDUR) 60 MG 24 hr tablet, Take 1.5 tablets (90 mg total) by mouth daily., Disp: 135 tablet, Rfl: 3 .  nitroGLYCERIN (NITROSTAT) 0.4 MG SL tablet, Place 0.4 mg under the tongue every 5 (five) minutes as needed for chest pain., Disp: , Rfl:  .  umeclidinium bromide (INCRUSE ELLIPTA) 62.5 MCG/INH AEPB, Inhale 1 puff into the lungs daily., Disp: 2 each, Rfl: 0      Objective:   Vitals:   08/04/18 1211  BP: 122/68  Pulse: 86  SpO2: 93%  Weight: 125 lb (56.7 kg)  Height: 5\' 1"  (1.549  m)    Estimated body mass index is 23.62 kg/m as calculated from the following:   Height as of this encounter: 5\' 1"  (1.549 m).   Weight as of this encounter: 125 lb (56.7 kg).  @WEIGHTCHANGE @  [employer]   08/04/18 1211  Weight: 125 lb (56.7 kg)     Physical Exam  General Appearance:    Alert, cooperative, no distress, appears stated age - older , Deconditioned looking - yes , OBESE  - no, Sitting on Wheelchair -  no  Head:    Normocephalic, without obvious abnormality, atraumatic  Eyes:    PERRL, conjunctiva/corneas clear,  Ears:    Normal TM's and external ear canals, both ears  Nose:   Nares normal, septum midline, mucosa normal, no drainage    or sinus tenderness. OXYGEN ON  - no . Patient is @ ra   Throat:   Lips, mucosa, and tongue normal; teeth  and gums normal. Cyanosis on lips - no  Neck:   Supple, symmetrical, trachea midline, no adenopathy;    thyroid:  no enlargement/tenderness/nodules; no carotid   bruit or JVD  Back:     Symmetric, no curvature, ROM normal, no CVA tenderness  Lungs:     Distress - no , Wheeze no, Barrell Chest - no, Purse lip breathing - no, Crackles - no   Chest Wall:    No tenderness or deformity.    Heart:    Regular rate and rhythm, S1 and S2 normal, no rub   or gallop, Murmur - no  Breast Exam:    NOT DONE  Abdomen:     Soft, non-tender, bowel sounds active all four quadrants,    no masses, no organomegaly. Visceral obesity - no  Genitalia:   NOT DONE  Rectal:   NOT DONE  Extremities:   Extremities - normal, Has Cane - no, Clubbing - no, Edema - no  Pulses:   2+ and symmetric all extremities  Skin:   Stigmata of Connective Tissue Disease - no  Lymph nodes:   Cervical, supraclavicular, and axillary nodes normal  Psychiatric:  Neurologic:   Pleasant - yes, Anxious - no, Flat affect - no  CAm-ICU - neg, Alert and Oriented x 3 - yes, Moves all 4s - yes, Speech - normal, Cognition - intact           Assessment:       ICD-10-CM   1. Other emphysema ([place]) J43.8   2. Pleuritic pain R07.81   3. Pulmonary nodules R91.8 CT Chest Wo Contrast  4. Abnormal findings on diagnostic imaging of lung R91.8 CT Chest Wo Contrast       Plan:     Patient Instructions  Other emphysema ([place])  - Resume incruse daily scheduled and do nebulizer as needed  - compliance important  Pleuritic pain - glad resolved. Autoimmune workup negative   Pulmonary nodules - small right upper lobe  -rpeat CT chest without contrast in 9 months   Followup - 9 months or sooner if needed; CAT score     SIGNATURE    Dr. Brand Males, M.D., F.C.C.P,  Pulmonary and Critical Care Medicine Staff Physician, [place] Director - Interstitial Lung Disease  Program  P[facility] [place] at [place], [place], [address]  Pager: [phone], If no answer or between  15:00h - 7:00h: call [phone] Telephone: [phone]  1:35 PM 08/04/2018

## 2018-08-06 ENCOUNTER — Telehealth: Payer: Self-pay | Admitting: Internal Medicine

## 2018-08-06 NOTE — Telephone Encounter (Signed)
Pt is calling back [phone] Pt needs a refill of [place]

## 2018-08-06 NOTE — Telephone Encounter (Signed)
ATC patient.  [phone], person answered and stated that was the wrong number. Called [phone], phone rings several times, then stops. Unable to leave message.

## 2018-08-06 NOTE — Telephone Encounter (Signed)
ATC just rang busy

## 2018-08-06 NOTE — Telephone Encounter (Signed)
ATC patient, unable to reach, no VM will leave in triage to call back later

## 2018-08-06 NOTE — Telephone Encounter (Signed)
Returning call CB# [phone]//kob

## 2018-08-07 MED ORDER — FLUCONAZOLE 150 MG PO TABS: 150.0000 mg | ORAL_TABLET | Freq: Every day | ORAL | 0 refills | Status: DC

## 2018-08-07 NOTE — Telephone Encounter (Addendum)
Called the pharmacy and confirmed the prescription sent in on 08/04/18 was not received on their end. Verbal authorization for Incruse was given.  Called the patient to make her aware the prescription was called in.  Patient stated that when she completes round of antibiotics (currently taking Cipro) that she gets a yeast infection and will try the OTC medications to treat and they do not work. Patient would like a prescription to treat yeast infection sent to pharmacy so that she can start taking it after she completes the Cipro that was issued on 07/28/18.  Message forwarded to app of the day Wyn Quaker) to approve/deny.   Aaron Edelman, Can this patient get a prescription sent to pharmacy (Diflucan?)

## 2018-08-07 NOTE — Telephone Encounter (Signed)
Called the patient and she confirmed she has taken Diflucan before without any side effects.  Note has been amended to reflect app of the day.  Routed back to Wyn Quaker, NP to review.  Aaron Edelman, please see above. Are you forwarding prescription or did you want me to make the entry?

## 2018-08-07 NOTE — Telephone Encounter (Signed)
Pt is calling back [phone]

## 2018-08-07 NOTE — Telephone Encounter (Signed)
Spoke with the pt and notified of recs per Aaron Edelman  She verbalized understanding  Rx sent  Will call if not improving

## 2018-08-07 NOTE — Telephone Encounter (Signed)
Can send prescription for:  Diflucan 150 mg tablet >>> Take 1 tablet today  >>>can repeat in 72 hours and take an additional tablet if she continues to have symptoms  Send 2 tablets.  Please send a prescription   Thank you so much.  Wyn Quaker, FNP

## 2018-08-07 NOTE — Telephone Encounter (Signed)
I am okay prescribing this.  Please clarify the note above this is the app of the day not Dr. of the day.  Please ensure the patient has taken Diflucan before.  And tolerated this well.  If so I am more than happy to send a prescription over so that she can have treatment for the yeast infection.  If she continues to have persisting symptoms she will need to present to primary care for further evaluation.  Wyn Quaker, FNP

## 2018-08-20 ENCOUNTER — Ambulatory Visit: Payer: Self-pay | Admitting: Neurology

## 2018-08-21 LAB — FUNGUS CULTURE W SMEAR
MICRO NUMBER:: 226772
SMEAR:: NONE SEEN
SPECIMEN QUALITY:: ADEQUATE

## 2018-08-21 LAB — RESPIRATORY CULTURE OR RESPIRATORY AND SPUTUM CULTURE
MICRO NUMBER: 226773
SPECIMEN QUALITY: ADEQUATE

## 2018-08-31 ENCOUNTER — Other Ambulatory Visit: Payer: Self-pay | Admitting: Cardiology

## 2018-09-01 ENCOUNTER — Telehealth: Payer: Self-pay | Admitting: *Deleted

## 2018-09-01 NOTE — Telephone Encounter (Signed)
Pt contacted per Dr Harl Bowie. History reviewed. No symptoms to suggest any unstable cardiac conditions. Says she has to take 2 NTG tablets "occasionally" but this is nothing new for her she has taken NTG for "years". Declined telehealth appt at this time. Routed to provider FYI. Pt denies any new/worsening symptoms  Based on discussion, with current pandemic situation, we have postponed 09/05/18  appointment until June 2020. If symptoms change, pt has been instructed to contact our office.

## 2018-09-04 ENCOUNTER — Ambulatory Visit: Payer: Self-pay | Admitting: Cardiology

## 2018-09-04 ENCOUNTER — Other Ambulatory Visit: Payer: Self-pay | Admitting: Cardiology

## 2018-09-15 ENCOUNTER — Other Ambulatory Visit: Payer: Self-pay | Admitting: Cardiology

## 2018-10-08 ENCOUNTER — Other Ambulatory Visit: Payer: Self-pay | Admitting: Internal Medicine

## 2018-10-08 ENCOUNTER — Telehealth: Payer: Self-pay | Admitting: Cardiology

## 2018-10-08 NOTE — Telephone Encounter (Signed)
Spoke with pt who states that she woke up this morning and her "lungs were burning" and her hands were red and painful. Pt denies chest pain and SOB. She states that sx are from her CHF and would like something called in for it. Informed patient to call PCP office. Pt states that she will but they will have her call our office. Please advise.

## 2018-10-08 NOTE — Telephone Encounter (Signed)
Please give pt a call-- complaining of a burning feeling in her lungs this morning would like to speak with the nurse or have Dr. Harl Bowie call her something in.   [phone]

## 2018-10-08 NOTE — Telephone Encounter (Signed)
   I have never saw this patient and by review of records she saw Richardson Dopp in 10/2017 and has since followed up with [facility] Cardiology with her last office visit being in 05/2018 by review of Care Everywhere. I agree with recommendation of contacting PCP initially given her multiple symptoms. She does not appear to be on a diuretic so would encourage her to follow daily weights.  She also has known emphysema by review of records and was previously evaluated by Pulmonology in 08/2018. Agree with PCP evaluation initially then can go from there in regards to repeat Cardiology or Pulmonology evaluation.   Signed, Erma Heritage, PA-C 10/08/2018, 11:05 AM Pager: [phone]

## 2018-10-09 ENCOUNTER — Telehealth: Payer: Self-pay | Admitting: Internal Medicine

## 2018-10-09 MED ORDER — OMEPRAZOLE 20 MG PO CPDR: 20.0000 mg | DELAYED_RELEASE_CAPSULE | Freq: Every day | ORAL | 3 refills | Status: DC

## 2018-10-09 NOTE — Telephone Encounter (Signed)
Trial omeprazole 20mg  - take 30 mins before breakfast. Also she should follow up with her PCP

## 2018-10-09 NOTE — Telephone Encounter (Signed)
Called and spoke with pt letting her know the info per Derl Barrow and let her know that [facility] sent Rx omeprazole to pharmacy for her. Pt expressed understanding. Nothing further needed.

## 2018-10-09 NOTE — Telephone Encounter (Signed)
Primary Pulmonologist: MR Last office visit and with whom: 08/04/2018 with MR What do we see them for (pulmonary problems): emphysema Last OV assessment/plan: Instructions   Other emphysema ([place])  - Resume incruse daily scheduled and do nebulizer as needed             - compliance important  Pleuritic pain - glad resolved. Autoimmune workup negative   Pulmonary nodules - small right upper lobe  -rpeat CT chest without contrast in 9 months   Followup - 9 months or sooner if needed; CAT score     Was appointment offered to patient (explain)?  Pt would like recommendations   Reason for call: Called and spoke with pt who stated the Incruse was working to help with her cough but pt stated she woke up yesterday morning having some pain in chest and also stated her lungs were hurting. Pt did take an aspirin due to the pain in her chest, used her incruse, and used her nebulizer which she stated helped with her symptoms. Pt stated she woke up again this morning 5/8 having the burning in her lungs again. When pt does have the burning in her lungs and throat, after she uses her medications, she will cough up white phlegm. Pt states the burning in her lungs and throat does go away after she uses her medications and she is fine throughout the remainder of the day.  Pt states the Incruse has been working fine but then the last couple mornings she has woken up with the burning in her lungs which was concerning to her.  Pt denies any complaints of fever or SOB and pt has not travelled anywhere recently nor has she been around anyone that has been sick. Pt wants recommendations to help with the burning in her lungs that she has had the past couple days. Beth, please advise on this for pt. Thanks!

## 2018-10-29 ENCOUNTER — Other Ambulatory Visit: Payer: Self-pay | Admitting: Internal Medicine

## 2018-11-10 ENCOUNTER — Ambulatory Visit (INDEPENDENT_AMBULATORY_CARE_PROVIDER_SITE_OTHER): Payer: Medicare Other | Admitting: Cardiology

## 2018-11-10 ENCOUNTER — Encounter: Payer: Self-pay | Admitting: *Deleted

## 2018-11-10 ENCOUNTER — Encounter: Payer: Self-pay | Admitting: Cardiology

## 2018-11-10 VITALS — BP 132/83 | HR 86 | Temp 99.0°F | Ht 61.0 in | Wt 125.0 lb

## 2018-11-10 DIAGNOSIS — E782 Mixed hyperlipidemia: Secondary | ICD-10-CM

## 2018-11-10 DIAGNOSIS — I10 Essential (primary) hypertension: Secondary | ICD-10-CM | POA: Diagnosis not present

## 2018-11-10 DIAGNOSIS — I2510 Atherosclerotic heart disease of native coronary artery without angina pectoris: Secondary | ICD-10-CM

## 2018-11-10 NOTE — Patient Instructions (Signed)

## 2018-11-10 NOTE — Progress Notes (Signed)
Clinical Summary [name] is a 73 y.o.female previously followed at [facility] cardiology, this is our first visit together.   1. CAD  10/2017 cath: LM patent, LAD prox 40%, OM1 60%, patent RCA. Mean PA 17, PCWP 15, CI 2.9  Overall mild to moderate nonobstructive disease stable from 2012 cath - 10/2017 LVEF 50-55%, no WMAs, grade I diastolic dysfunction - 08/5454 Dr Einar Gip  nuclear stress: no clear ischemia - 2018 Dr Einar Gip echo: LVEF 35-40% - 02/2018 48 hr holter [facility]: benign atrial and ventricular ectopy  - pulm notes history of pleuritic pain - compliant with meds.  - rare chest pains at times, will use SL Ng with improvement.   Toprol stopped she reports due to fatigue in the past, though unclear if was clearly related  2. Multiple sclerosis    3. HTN - compliant with meds   4. Hyperlipidemia - crestor allergy , has been on lovastatin without issue   5. Chronic LBBB  6. COPD - followed by pulmonary Dr Chase Caller    SH: Josph Macho is her brother, also a patient of mine.     Past Medical History:  Diagnosis Date  . Anxiety   . Aortic atherosclerosis ([place]) 09/29/2017   High Res CT in 9/18: aortic atherosclerosis; coronary artery calcification  . Cervical disc disease    BULGING  . Chronic combined systolic and diastolic CHF (congestive heart failure) ([place]) 09/29/2017   Echo 7/18: Moderate concentric LVH, grade 1 diastolic dysfunction, abnormal septal wall motion due to LBBB, moderate diffuse HK, EF 35-40, atrial septal aneurysm with possible PFO, mild TR // Echo 5/19: EF 50-55, normal wall motion, grade 1 diastolic dysfunction, trivial TR  . Complication of anesthesia    SLOW TO AWAKEN AFTER LAST COLONSCOPY  . Coronary artery disease 05/09/2015   LHC 10/12: EF 55, OM1 80-90 - difficult to engage >> treated medically // Nuc 8/18: EF 40, no ischemia  . Depression   . DJD (degenerative joint disease)    OA  . Dysrhythmia    Not clear where this diagnosis came  from  . Emphysema of lung ([place])    Dr. Chase Caller  . Fibromyalgia   . H/O: liver disease 87 YRS AGO   tranamanitis due to previous interferon - LFTs improved off of  . History of MI (myocardial infarction) FEW YRS AGO  . History of TIA (transient ischemic attack)   . Hx of colonic polyps   . Hyperlipidemia    intol to statin - myalgias // ESPERION trial - patient stopped  . Multiple sclerosis ([place])   . Narcotic dependence (HCC)    hx of benzodiazepine and narcotic  . Osteoporosis   . Positive H. pylori test   . Urinary incontinence      Allergies  Allergen Reactions  . Crestor [Rosuvastatin] Hives and Other (See Comments)    welps  . Sulfonamide Derivatives Hives    As a child  . Contrast Media [Iodinated Diagnostic Agents] Rash and Other (See Comments)    Severe rash     Current Outpatient Medications  Medication Sig Dispense Refill  . Ascorbic Acid (VITAMIN C) 1000 MG tablet Take 1,000 mg by mouth daily.    [name] aspirin EC 81 MG tablet Take 324 mg by mouth daily.     . Cholecalciferol (VITAMIN D3 PO) Take 1 capsule by mouth daily.     . clonazePAM (KLONOPIN) 1 MG tablet Take 1 mg by mouth as directed.    [name]  COENZYME Q10 PO Take 1 capsule by mouth 2 (two) times daily.     . diphenhydrAMINE (BENADRYL) 50 MG capsule Take 50 mg by mouth as needed.     . doxepin (SINEQUAN) 25 MG capsule Take 1 capsule (25 mg total) by mouth at bedtime. 30 capsule 5  . EVENING PRIMROSE OIL PO Take 2 capsules by mouth 2 (two) times daily.    [name] gabapentin (NEURONTIN) 100 MG capsule Take 100-300 mg by mouth See admin instructions. Take 100 mg by mouth daily and take 300 mg by mouth at bedtime    . gabapentin (NEURONTIN) 300 MG capsule Take 1 capsule (300 mg total) by mouth daily. 180 capsule 1  . guaiFENesin (MUCINEX) 600 MG 12 hr tablet Take 600 mg by mouth 2 (two) times daily as needed for cough or to loosen phlegm.     . Homeopathic Products (SIMILASAN DRY EYE RELIEF OP) Place 1 drop into both eyes  2 (two) times daily.    . INCRUSE ELLIPTA 62.5 MCG/INH AEPB INHALE 1 PUFF INTO THE LUNGS ONCE DAILY 60 each 4  . ipratropium (ATROVENT) 0.02 % nebulizer solution Take 2.5 mLs (0.5 mg total) by nebulization 4 (four) times daily. 120 mL 12  . isosorbide mononitrate (IMDUR) 60 MG 24 hr tablet Take 1.5 tablets (90 mg total) by mouth daily. 135 tablet 3  . LINZESS 290 MCG CAPS capsule Take 290 mcg by mouth daily before breakfast.     . losartan (COZAAR) 25 MG tablet Take 25 mg by mouth daily.     [name] lovastatin (MEVACOR) 40 MG tablet TAKE 1 TABLET BY MOUTH ONCE EVERY EVENING 90 tablet 1  . modafinil (PROVIGIL) 200 MG tablet TAKE 1 TABLET BY MOUTH TWICE A DAY 60 tablet 5  . Multiple Vitamin (MULTIVITAMIN WITH MINERALS) TABS tablet Take 1 tablet by mouth daily.    . nitroGLYCERIN (NITROSTAT) 0.4 MG SL tablet DISSOLVE 1 TABLET UNDER TONGUE AS NEEDEDFOR CHEST PAIN. MAY REPEAT 5 MINUTES APART 3 TIMES IF NEEDED 25 tablet 3  . NUCYNTA ER 50 MG TB12 Take 50 mg by mouth every 12 (twelve) hours.     . Omega-3 Fatty Acids (OMEGA 3 PO) Take 1 capsule by mouth daily.    [name] omeprazole (PRILOSEC) 20 MG capsule Take 1 capsule (20 mg total) by mouth daily. 30 capsule 3  . oxyCODONE-acetaminophen (PERCOCET) 5-325 MG per tablet Take 1 tablet by mouth every 4 (four) hours as needed for severe pain.     . polyethylene glycol powder (GLYCOLAX/MIRALAX) powder Take 17 g by mouth daily as needed for moderate constipation.     [name] PRISTIQ 50 MG 24 hr tablet Take 50 mg by mouth at bedtime.     [name] PROAIR HFA 108 (90 Base) MCG/ACT inhaler INHALE 2 PUFFS INTO THE LUNGS EVERY 6 HOURS AS NEEDED FOR WHEEZING ORSHORTNESS OF BREATH 8.5 g 3  . vitamin B-12 (CYANOCOBALAMIN) 1000 MCG tablet Take 1,500 mcg by mouth daily.    [name] doxycycline (VIBRA-TABS) 100 MG tablet Take 1 tablet (100 mg total) by mouth 2 (two) times daily. 14 tablet 0  . GLATOPA 20 MG/ML SOSY injection USE 1 INJECTION SUBCUTANEOUSLY ONCE A DAY 30 mL 3  . metoprolol succinate  (TOPROL-XL) 25 MG 24 hr tablet Take 25 mg by mouth daily.    [name] MOTEGRITY 2 MG TABS Take 1 tablet by mouth daily.    . predniSONE (DELTASONE) 10 MG tablet Take 2 tabs x 5 days 10 tablet 0  No current facility-administered medications for this visit.      Past Surgical History:  Procedure Laterality Date  . BACK SURGERY  11-2009   LOWER BACK  . BOTOX INJECTION N/A 03/21/2016   Procedure: BOTOX INJECTION;  Surgeon: Wilford Corner, MD;  Location: [facility];  Service: Endoscopy;  Laterality: N/A;  . CARDIAC CATHETERIZATION     UNSUCCESSFUL STENT PLACEMENT  . COLONSCOPY    . ESOPHAGEAL MANOMETRY N/A 10/23/2015   Procedure: ESOPHAGEAL MANOMETRY (EM);  Surgeon: Wilford Corner, MD;  Location: [facility];  Service: Endoscopy;  Laterality: N/A;  . ESOPHAGOGASTRODUODENOSCOPY (EGD) WITH PROPOFOL N/A 03/21/2016   Procedure: ESOPHAGOGASTRODUODENOSCOPY (EGD) WITH PROPOFOL;  Surgeon: Wilford Corner, MD;  Location: [facility];  Service: Endoscopy;  Laterality: N/A;  . RIGHT/LEFT HEART CATH AND CORONARY ANGIOGRAPHY N/A 10/02/2017   Procedure: RIGHT/LEFT HEART CATH AND CORONARY ANGIOGRAPHY;  Surgeon: Nelva Bush, MD;  Location: [facility];  Service: Cardiovascular;  Laterality: N/A;  . TUBAL LIGATION       Allergies  Allergen Reactions  . Crestor [Rosuvastatin] Hives and Other (See Comments)    welps  . Sulfonamide Derivatives Hives    As a child  . Contrast Media [Iodinated Diagnostic Agents] Rash and Other (See Comments)    Severe rash      Family History  Problem Relation Age of Onset  . Heart attack Mother 81  . Heart attack Father 61  . Cancer Brother      Social History Ms. Down reports that she quit smoking about 9 years ago. Her smoking use included cigarettes. She has a 48.00 pack-year smoking history. She has never used smokeless tobacco. Ms. Baka reports no history of alcohol use.   Review of Systems CONSTITUTIONAL: No weight loss, fever, chills,  weakness or fatigue.  HEENT: Eyes: No visual loss, blurred vision, double vision or yellow sclerae.No hearing loss, sneezing, congestion, runny nose or sore throat.  SKIN: No rash or itching.  CARDIOVASCULAR: per hpi RESPIRATORY: No shortness of breath, cough or sputum.  GASTROINTESTINAL: No anorexia, nausea, vomiting or diarrhea. No abdominal pain or blood.  GENITOURINARY: No burning on urination, no polyuria NEUROLOGICAL: No headache, dizziness, syncope, paralysis, ataxia, numbness or tingling in the extremities. No change in bowel or bladder control.  MUSCULOSKELETAL: No muscle, back pain, joint pain or stiffness.  LYMPHATICS: No enlarged nodes. No history of splenectomy.  PSYCHIATRIC: No history of depression or anxiety.  ENDOCRINOLOGIC: No reports of sweating, cold or heat intolerance. No polyuria or polydipsia.  [name]   Physical Examination Vitals:   11/10/18 1444  BP: 132/83  Pulse: 86  Temp: 99 F (37.2 C)  SpO2: 97%   Filed Weights   11/10/18 1444  Weight: 125 lb (56.7 kg)    Gen: resting comfortably, no acute distress HEENT: no scleral icterus, pupils equal round and reactive, no palptable cervical adenopathy,  CV: RRR, no mr/g, nlo jvd Resp: Clear to auscultation bilaterally GI: abdomen is soft, non-tender, non-distended, normal bowel sounds, no hepatosplenomegaly MSK: extremities are warm, no edema.  Skin: warm, no rash Neuro:  no focal deficits Psych: appropriate affect     Assessment and Plan  1. CAD - no significant symptoms - unclear if her toprol truly was associated with fatigue. We discussed the benefits given her history of CAD and cardiomyopathy, she will retry low dose  2. HTN -at goal, continue current meds   3. Hyperlipidemia - continue statin   F/u 6 months   Arnoldo Lenis, M.D.,

## 2018-11-13 ENCOUNTER — Telehealth: Payer: Self-pay | Admitting: Cardiology

## 2018-11-13 NOTE — Telephone Encounter (Signed)
Provider received mychart message and hasn't responded at this time was just sent this morning, will contact pt when provider responds

## 2018-11-13 NOTE — Telephone Encounter (Signed)
Checking to make sure Dr Harl Bowie got her non-Urgent message

## 2018-11-16 NOTE — Telephone Encounter (Signed)
Yes, if tolerating toprol 25mg  daily I would continue. Sometimes the body takes a few days to get used to being on toprol, hopefully will tolerate more and more. Please add to her med list,   J Karenann Mcgrory MD

## 2018-11-16 NOTE — Telephone Encounter (Signed)
Contacted to clarify mychart message. Patient said she started taking Toprol XL 50 mg (taking half of it-25 mg) on last Thursday because her BP was elevated and HR 120 bpm and she had a headache. Reports that on Friday, she had neck pain and she took 12.5 mg of toprol xl and the neck pain resolved. Today BP 130/60 & HR 69. Says she has been taking 25 mg since last Thursday and says she feels okay after taking it and wants to know if she should continue taking toprol xl 25 mg daily.

## 2018-11-25 ENCOUNTER — Other Ambulatory Visit: Payer: Self-pay | Admitting: Neurology

## 2018-11-27 ENCOUNTER — Telehealth: Payer: Self-pay | Admitting: Internal Medicine

## 2018-11-27 ENCOUNTER — Ambulatory Visit (INDEPENDENT_AMBULATORY_CARE_PROVIDER_SITE_OTHER): Payer: Medicare Other | Admitting: Primary Care

## 2018-11-27 ENCOUNTER — Other Ambulatory Visit: Payer: Self-pay

## 2018-11-27 ENCOUNTER — Encounter: Payer: Self-pay | Admitting: Primary Care

## 2018-11-27 ENCOUNTER — Ambulatory Visit: Payer: Medicare Other

## 2018-11-27 DIAGNOSIS — J441 Chronic obstructive pulmonary disease with (acute) exacerbation: Secondary | ICD-10-CM | POA: Diagnosis not present

## 2018-11-27 MED ORDER — DOXYCYCLINE HYCLATE 100 MG PO TABS: 100.0000 mg | ORAL_TABLET | Freq: Two times a day (BID) | ORAL | 0 refills | Status: DC

## 2018-11-27 MED ORDER — PREDNISONE 10 MG PO TABS: ORAL_TABLET | ORAL | 0 refills | Status: DC

## 2018-11-27 NOTE — Telephone Encounter (Signed)
Spoke with the pt  She states she prefers televisit with Beth  Appt scheduled for 3:30

## 2018-11-27 NOTE — Telephone Encounter (Signed)
Primary Pulmonologist: MW Last office visit and with whom: 08/26/18 w/MR What do we see them for (pulmonary problems): COPD Last OV assessment/plan: Other emphysema ([place])  - Resume incruse daily scheduled and do nebulizer as needed             - compliance important  Pleuritic pain - glad resolved. Autoimmune workup negative   Pulmonary nodules - small right upper lobe  -rpeat CT chest without contrast in 9 months   Followup - 9 months or sooner if needed; CAT score  Was appointment offered to patient (explain)?  Patient declined for now. Wanted recommendations first.    Reason for call: Spoke with patient. She stated that she has been coughing up thick, brownish/greenish phlegm for the past 2 days. She normally has a cough but it is never as productive as it is now. The pain on her left side has also returned. Described as a dull, achy pain. She is not able to cough as hard as she wants to due to the rib pain.   She denied having any fevers as she has been checking her temp frequently. Also denied any increase of SOB. Denied being around anyone with COVID as she has been distancing herself from others due to the risk of COVID. She has been using her Incruse, Atrovent and Delsym.   Pharmacy is [facility] on [address].   Tonya, please advise. Thanks!

## 2018-11-27 NOTE — Telephone Encounter (Signed)
Please place on either Aaron Edelman or Beth's schedule this afternoon for e-visit. Thanks.

## 2018-11-27 NOTE — Progress Notes (Signed)
Virtual Visit via Telephone Note  I connected with [name] on 11/27/18 at  3:30 PM EDT by telephone and verified that I am speaking with the correct person using two identifiers.  Location: Patient: Home Provider: Office   I discussed the limitations, risks, security and privacy concerns of performing an evaluation and management service by telephone and the availability of in person appointments. I also discussed with the patient that there may be a patient responsible charge related to this service. The patient expressed understanding and agreed to proceed.   History of Present Illness: 73 year old female, former smoker quit 2011(48 pack year hx). PMH COPD, emphysema, lung nodule, fibromyalgia, MS, heart failure. Patient of Dr. Chase Caller, last seen on 08/04/18. HRCT in 2018 showed 6mm LUL nodule unchanged consistent with benign etiology, mild emphysema. Needs repeat CT chest f/u pulmonary nodules in Dec 2020. Maintained on Incruse, compliance has been an issue in the past.   11/27/2018 Patient contact today for televisit. Reports cough x 2 days. Fell Saturday night 5/20 , spoke with one of her doctors who thinks she may have fracture a rib. States that the pain has been awful today. States that it was a whole lot better yesterday but this morning its worse.  She is a little short of breath but not significantly. Hurts to take a deep breath. She is coughing up dark tan mucus. She is unable to get CXR today.   Observations/Objective:  Patient reported vitals: - O2 92% RA  Assessment and Plan:  Acute bronchitis/COPD exacerbation - Developed cough 2 days ago after fall, associated mild shortness of breath - RX Doxycycline 1 tab twice daily x 7 days and prednisone 20mg  x 5 days  - Needs CXR to ensure she does not have pneumothorax (she will get on Monday 5/29) - Encourage bracing with pillow when coughing and slow deep breathing exercises  - Advised if symptoms worsen OR she develops  acute sob/pain/hemoptysis to present to ED   Follow Up Instructions:   - As needed or if symptoms do not improve   I discussed the assessment and treatment plan with the patient. The patient was provided an opportunity to ask questions and all were answered. The patient agreed with the plan and demonstrated an understanding of the instructions.   The patient was advised to call back or seek an in-person evaluation if the symptoms worsen or if the condition fails to improve as anticipated.  I provided 15 minutes of non-face-to-face time during this encounter.   Martyn Ehrich, NP

## 2018-11-27 NOTE — Patient Instructions (Addendum)
Symptoms consistent with bronchitis/COPD exacerbation   RX: - Doxycycline 1 tab twice daily x 7 days and prednisone 20mg  x 5 days   Orders: - Needs CXR to ensure you do not have pneumothorax or pneumonia (ordered for Monday 5/29)  Recommendations: - Advised if symptoms worsen OR if you develop acute sob/pain/hemoptysis to present to ED - Brace with pillow when coughing and encourage you take frequent slow deep breaths every hour

## 2018-11-30 ENCOUNTER — Ambulatory Visit (INDEPENDENT_AMBULATORY_CARE_PROVIDER_SITE_OTHER): Payer: Medicare Other

## 2018-11-30 DIAGNOSIS — J441 Chronic obstructive pulmonary disease with (acute) exacerbation: Secondary | ICD-10-CM

## 2018-12-01 ENCOUNTER — Telehealth: Payer: Self-pay | Admitting: Internal Medicine

## 2018-12-01 ENCOUNTER — Telehealth: Payer: Self-pay | Admitting: *Deleted

## 2018-12-01 MED ORDER — BENZONATATE 200 MG PO CAPS: 200.0000 mg | ORAL_CAPSULE | Freq: Three times a day (TID) | ORAL | 1 refills | Status: DC | PRN

## 2018-12-01 NOTE — Telephone Encounter (Signed)
Note   Called pt with results of cxr. Pt is requesting something for cough. She completed prednisone taper today. She has 4 more days left on doxycycline. She has been on Delsym for years and its not helpful now. Pharmacy: [facility]       Duplicate message will be closed This has been addressed.  Another message taken re: cough medication and addressed as well. Nothing further needed.

## 2018-12-01 NOTE — Progress Notes (Signed)
Please let patient know CXR did not show any active disease. No evidence of pneumothorax and no acute body abnormality. Thanks

## 2018-12-01 NOTE — Telephone Encounter (Signed)
Called patient and notified that tessalon was sent to pharmacy. Nothing further needed.

## 2018-12-01 NOTE — Telephone Encounter (Signed)
Called pt with results of cxr. Pt is requesting something for cough. She completed prednisone taper today. She has 4 more days left on doxycycline. She has been on Delsym for years and its not helpful now. Pharmacy: [facility].   11/27/18   Assessment and Plan:  Acute bronchitis/COPD exacerbation - Developed cough 2 days ago after fall, associated mild shortness of breath - RX Doxycycline 1 tab twice daily x 7 days and prednisone 20mg  x 5 days  - Needs CXR to ensure she does not have pneumothorax (she will get on Monday 5/29) - Encourage bracing with pillow when coughing and slow deep breathing exercises  - Advised if symptoms worsen OR she develops acute sob/pain/hemoptysis to present to ED

## 2018-12-01 NOTE — Telephone Encounter (Signed)
Tessalon perles sent, take three times a day as needed for cough

## 2018-12-02 MED ORDER — BENZONATATE 200 MG PO CAPS: 200.0000 mg | ORAL_CAPSULE | Freq: Three times a day (TID) | ORAL | 1 refills | Status: DC | PRN

## 2018-12-02 NOTE — Telephone Encounter (Signed)
Called & spoke w/ pt regarding inconclusive MyChart message. Pt recently received a prescription for Tessalon perles yesterday 12/02/2018 per [facility] for her cough, however, pt states it was sent to the wrong pharmacy. Pt states she would like the script to be sent to [address]. I let pt know I would send the order to this pharmacy. Pt expressed understanding with no additional questions.   Order for tessalon perles have been sent to correct pharmacy. Nothing further needed at this time.

## 2018-12-07 ENCOUNTER — Telehealth: Payer: Self-pay | Admitting: Internal Medicine

## 2018-12-07 DIAGNOSIS — R911 Solitary pulmonary nodule: Secondary | ICD-10-CM

## 2018-12-07 NOTE — Telephone Encounter (Signed)
Pt is returning call. Cb is [phone].

## 2018-12-07 NOTE — Telephone Encounter (Signed)
I attempted to call pt after seeing two MyChart messages and this sick message. LMTCB. At time of call, pt did not answer. Will leave in Triage box to f/u on.

## 2018-12-07 NOTE — Telephone Encounter (Signed)
Primary Pulmonologist: MR Last office visit and with whom: 11/27/2018 w/ BW What do we see them for (pulmonary problems): COPD, Pleuritic pain, Pulmonary nodules, other emphysema  Reason for call: Pt sent 2 MyChart messages and called regarding "burning" sensation in L rib/lung area. Called & spoke w/ pt and she states the pain is a constant burning pain rated 6/10 in her L lung and she reports when she breathes in deeply, "something pops." Reports pain rated 11/10 yesterday. Denies fever/chills/muscle aches. Denies SOB, cough, wheezing. Pt states she has some chest pain/tightness. She reports taking Incruse daily some days and twice daily other days. She further notes that when she takes her dose it "feels as if no medication is coming out." Pt states she does not want "anything sugarcoated"--she "wants the truth." She notes that she had an X-ray recently but inquires if she will need an MRI.   In the last month, have you been in contact with someone who was confirmed or suspected to have Conoravirus / COVID-19?  No  Do you have any of the following symptoms developed in the last 30 days? Fever: No Cough: No Shortness of breath: No  When did your symptoms start?  Yesterday 12/06/2018  If the patient has a fever, what is the last reading?  (use n/a if patient denies fever)  N/A . IF THE PATIENT STATES THEY DO NOT OWN A THERMOMETER, THEY MUST GO AND PURCHASE ONE When did the fever start?: N/A Have you taken any medication to suppress a fever (ie Ibuprofen, Aleve, Tylenol)?: N/A  Since MR is not in the office and pt saw Beth recently, will route to [place].  Beth, please advise with your recommendations for this pt. Thank you.

## 2018-12-07 NOTE — Telephone Encounter (Signed)
ATC pt x1 - no answer and VM has not been set up yet.  WCB.

## 2018-12-07 NOTE — Telephone Encounter (Signed)
Please see telephone note from 12/07/2018. Called & spoke w/ pt regarding these messages. Will close this message out to prevent duplication. Nothing further needed at this time.

## 2018-12-07 NOTE — Telephone Encounter (Signed)
I would recommend she follow-up with PCP. Needs further cardiac/muscular skeletal work-up. We will order HRCT re: pulmonary nodule, dyspnea, chest pain

## 2018-12-07 NOTE — Telephone Encounter (Signed)
Pt is returning call. Cb is [phone]

## 2018-12-07 NOTE — Telephone Encounter (Signed)
Called & spoke w/ pt regarding BW's recommendations. Pt verbalized understanding with no additional questions.   Order for CT high res has been placed. Nothing further needed at this time.

## 2018-12-12 ENCOUNTER — Other Ambulatory Visit: Payer: Self-pay | Admitting: Cardiology

## 2018-12-21 ENCOUNTER — Other Ambulatory Visit: Payer: Self-pay | Admitting: Cardiology

## 2018-12-24 ENCOUNTER — Other Ambulatory Visit: Payer: Self-pay | Admitting: Cardiology

## 2018-12-24 ENCOUNTER — Other Ambulatory Visit: Payer: Self-pay | Admitting: Neurology

## 2018-12-28 ENCOUNTER — Inpatient Hospital Stay: Admission: RE | Admit: 2018-12-28 | Payer: Medicare Other | Source: Ambulatory Visit

## 2019-01-20 ENCOUNTER — Telehealth: Payer: Self-pay | Admitting: Internal Medicine

## 2019-01-20 DIAGNOSIS — R059 Cough, unspecified: Secondary | ICD-10-CM

## 2019-01-20 DIAGNOSIS — R05 Cough: Secondary | ICD-10-CM

## 2019-01-20 DIAGNOSIS — R0602 Shortness of breath: Secondary | ICD-10-CM

## 2019-01-20 MED ORDER — LEVOFLOXACIN 500 MG PO TABS: 500.0000 mg | ORAL_TABLET | Freq: Every day | ORAL | 0 refills | Status: DC

## 2019-01-20 NOTE — Telephone Encounter (Signed)
Sorry to hear the patient is not feeling well.  With patient's February/2020 sputum results showing Pseudomonas.  We can treat patient with an antibiotic:  Levaquin 500mg  tablet >>> Take 1 500 mg tablet daily for the next 7 days >>> Take with food >>> start probiotic for good gut health >>>avoid rigorous exercise for next 2 weeks   Please place the order.   Patient still needs to proceed forward with planned high-resolution CT chest.  I would also recommend the patient complete an outpatient SARS-CoV-2 test based off of her symptoms:  Please place the order for [id number] for COVID testing.  Place order as normal and lab collect.  Appointments are not required and please instruct the patient to present to 1 of these testing sites listed below whichever is closest and most convenient for the patient.  COVID Testing Site Locations (For sick patients only, pre-procedure is done differently)  . [place] [address], [place], [place] [address] . [place]  (on [address])  . [place] [address], [place] (across from [facility] Emergency Department)   Please also schedule patient for a follow-up with Dr. Chase Caller.  I believe he should have openings on 02/01/2019.  If not please communicate with Sharl Ma to get the patient scheduled for a follow-up with Dr. Chase Caller as he opens up more clinic days.  If patient is acutely worsening prior to that office visit he can schedule with an APP such as [facility] who she has seen before.  I will route to EW and MR as fyi.  Wyn Quaker, FNP

## 2019-01-20 NOTE — Telephone Encounter (Signed)
Spoke with patient. She has agreed to the Levaquin and COVID testing. She is aware of the COVID test location. Order has been placed. Levaquin has been ordered.   She has also been scheduled for MR on 02/01/19 at 1030.   Nothing further needed at time of call.

## 2019-01-20 NOTE — Telephone Encounter (Signed)
Thanks for all you do   B

## 2019-01-20 NOTE — Telephone Encounter (Signed)
Thanks Aaron Edelman

## 2019-01-20 NOTE — Telephone Encounter (Signed)
LMTCB

## 2019-01-20 NOTE — Telephone Encounter (Signed)
Primary Pulmonologist: Ramaswamy Last office visit and with whom: 11/27/2018 with Beth What do we see them for (pulmonary problems): COPD  Reason for call:  Spoke with pt. States that she is not feeling well. Reports increased shortness of breath, coughing, chest congestion and wheezing. Cough is producing green mucus. Denies chest tightness, fever/chills/sweats, lost of taste or smell, abdominal pain, N/V/D.  In the last month, have you been in contact with someone who was confirmed or suspected to have Conoravirus / COVID-19?  No  Do you have any of the following symptoms developed in the last 30 days? Fever: No Cough: Yes Shortness of breath: Yes  When did your symptoms start?  3-4 days ago  If the patient has a fever, what is the last reading?  (use n/a if patient denies fever)  N/A  Aaron Edelman - please advise. Thanks.

## 2019-01-22 ENCOUNTER — Other Ambulatory Visit: Payer: Self-pay

## 2019-01-22 ENCOUNTER — Ambulatory Visit (INDEPENDENT_AMBULATORY_CARE_PROVIDER_SITE_OTHER)
Admission: RE | Admit: 2019-01-22 | Discharge: 2019-01-22 | Disposition: A | Discharge status: Home / Self Care | Payer: Medicare Other | Source: Ambulatory Visit | Attending: Primary Care | Admitting: Primary Care

## 2019-01-22 ENCOUNTER — Telehealth: Payer: Self-pay | Admitting: Primary Care

## 2019-01-22 DIAGNOSIS — R911 Solitary pulmonary nodule: Secondary | ICD-10-CM | POA: Diagnosis not present

## 2019-01-22 NOTE — Telephone Encounter (Signed)
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 Or can we try and get her on your schedule

## 2019-01-22 NOTE — Telephone Encounter (Signed)
ATC pt, line rang several times then went to a busy signal. Will try back.

## 2019-01-25 ENCOUNTER — Telehealth: Payer: Self-pay | Admitting: Internal Medicine

## 2019-01-25 NOTE — Telephone Encounter (Signed)
LMTCB x2 for pt 

## 2019-01-25 NOTE — Telephone Encounter (Signed)
Looks like patient has an appointment with MR on 02/01/19. Nothing further needed.

## 2019-01-25 NOTE — Telephone Encounter (Signed)
Pt calling back said she received call for lisa? Please advise she can be reached @ [phone].Hillery Hunter

## 2019-01-25 NOTE — Telephone Encounter (Signed)
Called and spoke with Patient.  There are no notes per chart, where anyone had called.  Patient is to follow up with Dr Chase Caller 02/01/19.  Nothing further at this time.

## 2019-01-27 ENCOUNTER — Telehealth: Payer: Self-pay | Admitting: Neurology

## 2019-01-27 NOTE — Telephone Encounter (Signed)
Patient left VM needing a PA done for medication. She did not give any other info than that. Thanks!

## 2019-01-27 NOTE — Telephone Encounter (Signed)
LMTCB x3 for pt. We have attempted to contact pt several times with no success or call back from pt. Per triage protocol, message will be closed.   

## 2019-01-27 NOTE — Telephone Encounter (Signed)
Do you have a PA in folder for this patient?

## 2019-02-01 ENCOUNTER — Other Ambulatory Visit: Payer: Self-pay

## 2019-02-01 ENCOUNTER — Ambulatory Visit: Payer: Medicare Other | Admitting: Internal Medicine

## 2019-02-01 ENCOUNTER — Encounter: Payer: Self-pay | Admitting: Internal Medicine

## 2019-02-01 ENCOUNTER — Other Ambulatory Visit: Payer: Self-pay | Admitting: General Surgery

## 2019-02-01 VITALS — BP 128/74 | HR 82 | Temp 97.9°F | Ht 61.0 in | Wt 120.8 lb

## 2019-02-01 DIAGNOSIS — R918 Other nonspecific abnormal finding of lung field: Secondary | ICD-10-CM

## 2019-02-01 DIAGNOSIS — Z23 Encounter for immunization: Secondary | ICD-10-CM | POA: Diagnosis not present

## 2019-02-01 DIAGNOSIS — J438 Other emphysema: Secondary | ICD-10-CM

## 2019-02-01 NOTE — Progress Notes (Signed)
@Patient  ID: [name], female    DOB: 08/26/1945, 73 y.o.   MRN: [id number]  Chief Complaint  Patient presents with   Follow-up    Emphysema     Referring provider: Donald Prose, MD  HPI: 73 year old female former smoker followed for emphysema, and lung nodule  TEST  High-resolution CT chest September 2018 showed 16 mm left upper lobe nodule unchanged dating back to September 2014 consistent with benign etiology, mild emphysema   12/01/2017 Follow up : Emphysema  Patient returns for a 57-month follow-up.  Patient was seen last visit.  She was started on ipratropium nebulizers twice daily.  Says that this has really helped her breathing.  She feels less short of breath.  She has been able to be more active. Feels that this is the best she is done in a long time.  She is actually able to get out and do things.  She is very happy with this.  She is now able to walk her dog. PFT today showed normal lung function with FEV1 at 109%, ratio 82, FVC 100%, DLCO 87%.  OV 05/14/2018  Subjective:  Patient ID: [name], female , DOB: June 27, 1945 , age 24 y.o. , MRN: [id number] , ADDRESS: [address] [place] [facility] [address]   05/14/2018 -   Chief Complaint  Patient presents with   Follow-up    pt reports of sob with exertion & chest discomfort.    + HPI [name] 73 y.o. -presents for follow-up of emphysema associated with bilateral lower lobe crackles.  No evidence of ILD on September 2018 CT chest.  Overall she is stable.  COPD CAT score is only 10.  She is on some kind of a bronchodilator regimen and she is not fully compliant with it.  She says she will step up her compliance.  She says she is up-to-date with the flu shot but we do not have a date on this.  She says she is depressed because it is Christmas time.  She also thinks her cardiologist gave her a bad prognosis.  She does not know what the diseases.      Patient ID: [name], female    DOB:  1945-06-08, 73 y.o.   MRN: [id number]  Chief Complaint  Patient presents with   Chest Pain    Left sided ribs have been hurting for the past few days. Thinks she may have cracked a rib with a recent cold.     Referring provider: Donald Prose, MD  HPI: 73 year old female, former smoker quit 2011(48 pack year hx). PMH COPD, fibromyalgia, MS, heart failure. Patient of Dr. Chase [name], last see by NP on 06/02/18. CXR showed no evidence of PNA or CHF. Some chronic bronchitic changes related to smoking. HRCT in 2018 showed no significant findings except for emphysema.   07/14/2018 Patient presents today for acute visit with continued complaints of left pleuritic pain. Reports that she had a bad cough which has slowly improved. Left rib cage is sore to touch and skin burns. No rash. Takes percocet for chronic pain/fibromyalgia, unsure if it helps. LFTs were normal. Ordered for CT abd but patient cancelled d/t cough. She has not been taking incruse inhaler or Atrovent nebulizer as often as it is prescribed. EKG today showed SR with left bundle branch block, unchanged from previous in May 2019. Denies shortness of breath.    OV 08/04/2018  Subjective:  Patient ID: [name], female ,  DOB: [birth year] , age 57 y.o. , MRN: [id number] , ADDRESS: [address] [place] [facility] [address]   08/04/2018 -   Chief Complaint  Patient presents with   Follow-up    Pt states she is still taking abx after recent OV with Audrey Barrow, NP. Pt denies any current complaints of SOB but states she has had an occ cough. Pt states she still has been having problems with pain in her chest.     HPI [name] 73 y.o. -presents for follow-up.  She has COPD and fibromyalgia.  She is a Designer, jewellery recently and had multitude of complaints including pleuritic chest pain on the left side.  This resulted in a CT scan of the chest and also autoimmune work-up which were negative.  The CT scan shows right upper lobe  scattered pulmonary nodules are extremely small.  This on the contralateral side to the pain.  Today she tells me that the left-sided pleuritic pain is resolved.  She says since that she is having mood issues.  She also is constantly shaking her head which she says is a chronic intermittent issue.  Last visit nurse practitioner gave her Incruse inhaler but she has no idea that this was even dispensed to her prescribed for her.  She thinks our office felt short in sending the prescription to the pharmacy.  Instead she is using albuterol as needed.  COPD CAT score is minimal and symptom score is documented below.       IMPRESSION: Interval development of cluster of small nodules seen in the lateral portion of right upper lobe concerning for atypical infection such as mycobacterium, or chronic sequela of previous such infection. Clinical correlation is recommended.  Stable pleural-based irregular density noted in left lung apex most consistent with benign scarring.  Minimal bilateral posterior basilar subsegmental atelectasis or scarring is noted.  Mild coronary artery calcifications are noted.  Aortic Atherosclerosis (ICD10-I70.0).   Electronically Signed   By: [name], M.D.   On: 07/15/2018 16:05   Results for Audrey, [name] (MRN [id number]) as of 08/04/2018 12:46  Ref. Range 07/17/2018 12:08 07/17/2018 12:08  Anti Nuclear Antibody(ANA) Latest Ref Range: Negative  NEGATIVE Negative  ANA Titer 1 Unknown  Negative  Cyclic Citrullin Peptide Ab Latest Units: UNITS <16   dsDNA Ab Latest Ref Range: 0 - 9 IU/mL  <1  ENA RNP Ab Latest Ref Range: 0.0 - 0.9 AI  <0.2  ENA SSA (RO) Ab Latest Ref Range: 0.0 - 0.9 AI  <0.2  ENA SSB (LA) Ab Latest Ref Range: 0.0 - 0.9 AI  <0.2  Myeloperoxidase Abs Latest Units: AI <1.0   Serine Protease 3 Latest Units: AI <1.0   RA Latex Turbid. Latest Ref Range: <14 IU/mL <14   ENA SM Ab Ser-aCnc Latest Ref Range: 0.0 - 0.9 AI  <0.2  Complement  C3, Serum Latest Ref Range: 82 - 167 mg/dL  117  SSA (Ro) (ENA) Antibody, IgG Latest Ref Range: <1.0 NEG AI <1.0 NEG   SSB (La) (ENA) Antibody, IgG Latest Ref Range: <1.0 NEG AI <1.0 NEG   Scleroderma (Scl-70) (ENA) Antibody, IgG Latest Ref Range: 0.0 - 0.9 AI <1.0 NEG 0.3    OV 02/01/2019  Subjective:  Patient ID: [name], female , DOB: 1945/06/21 , age 38 y.o. , MRN: [id number] , ADDRESS: [address] [place] [facility] [address]   02/01/2019 -   Chief Complaint  Patient presents with   Pulmonary  Emphysema    Medications are working, no breathing issues.     HPI ANAMAE ROCHELLE 73 y.o. -returns for follow-up of her mild emphysema and pulmonary nodules.  She is on anticholinergic inhaler.  She tells me that overall she is actually feeling really great.  Denies any cough or sputum production or chills or weight loss when asked but answered differenty in CAT score She is deeply appreciative of our care.  She has reluctantly agreed for flu shot.  She says she does not trust the [employer].  I explained to her that the flu shots are fairly approved per the manufacturer is primary.  Explained to her the benefits, risks and limitations.  In terms of pulmonary nodules she has follow-up CT scan of the chest August 2020 that I personally visualized.  In the last 6 months she has more micronodules in the right lower lobe.  Radiologist is concerned about MAI.  I did explain this to her.  Currently because of the [place] pandemic she is nervous about coming to the hospital for bronchoscopy.  In addition she is also feeling relatively asymptomatic.  Therefore she wants to adopt an expectant approach.  Clearly if the infiltrates are getting worse again or she gets symptomatic then she is willing to come in for bronchoscopy.     CAT COPD Symptom & Quality of Life Score (GSK trademark) 0 is no burden. 5 is highest burden 05/14/2018 0 08/04/2018  02/01/2019   Never Cough -> Cough all the  time 0 0 2  No phlegm in chest -> Chest is full of phlegm 0 0 2  No chest tightness -> Chest feels very tight 0 2 3  No dyspnea for 1 flight stairs/hill -> Very dyspneic for 1 flight of stairs 3 0 22.5  No limitations for ADL at home -> Very limited with ADL at home 3 5 2   Confident leaving home -> Not at all confident leaving home 0 3 0  Sleep soundly -> Do not sleep soundly because of lung condition 0 0 0  Lots of Energy -> No energy at all 5 4 4.5  TOTAL Score (max 40)  10 11 16     IMPRESSION: 1. There is extensive, clustered centrilobular and tree-in-bud pulmonary nodularity bilaterally with mild associated tubular bronchiectasis and occasional plugging. There is diffusely new and increased nodularity and new and increased small consolidations, particularly in the dependent right lower lobe (series 5, image 203). 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  2.  Aortic atherosclerosis and coronary artery disease.   Electronically Signed   By: Eddie Candle M.D.   On: 01/22/2019 14:28   ROS - per HPI     has a past medical history of Anxiety, Aortic atherosclerosis ([place]) (09/29/2017), Cervical disc disease, Chronic combined systolic and diastolic CHF (congestive heart failure) ([place]) (29/47/6546), Complication of anesthesia, Coronary artery disease (05/09/2015), Depression, DJD (degenerative joint disease), Dysrhythmia, Emphysema of lung ([place]), Fibromyalgia, H/O: liver disease (18 YRS AGO), History of MI (myocardial infarction) (FEW YRS AGO), History of TIA (transient ischemic attack), colonic polyps, Hyperlipidemia, Multiple sclerosis ([place]), Narcotic dependence ([place]), Osteoporosis, Positive H. pylori test, and Urinary incontinence.   reports that she quit smoking about 9 years ago. Her smoking use included cigarettes. She has a 48.00 pack-year smoking history. She has never used smokeless tobacco.  Past Surgical History:    Procedure Laterality Date   BACK SURGERY  11-2009   LOWER BACK   BOTOX  INJECTION N/A 03/21/2016   Procedure: BOTOX INJECTION;  Surgeon: Wilford Corner, MD;  Location: [facility];  Service: Endoscopy;  Laterality: N/A;   CARDIAC CATHETERIZATION     UNSUCCESSFUL STENT PLACEMENT   COLONSCOPY     ESOPHAGEAL MANOMETRY N/A 10/23/2015   Procedure: ESOPHAGEAL MANOMETRY (EM);  Surgeon: Wilford Corner, MD;  Location: [facility];  Service: Endoscopy;  Laterality: N/A;   ESOPHAGOGASTRODUODENOSCOPY (EGD) WITH PROPOFOL N/A 03/21/2016   Procedure: ESOPHAGOGASTRODUODENOSCOPY (EGD) WITH PROPOFOL;  Surgeon: Wilford Corner, MD;  Location: [facility];  Service: Endoscopy;  Laterality: N/A;   RIGHT/LEFT HEART CATH AND CORONARY ANGIOGRAPHY N/A 10/02/2017   Procedure: RIGHT/LEFT HEART CATH AND CORONARY ANGIOGRAPHY;  Surgeon: Nelva Bush, MD;  Location: [facility];  Service: Cardiovascular;  Laterality: N/A;   TUBAL LIGATION      Allergies  Allergen Reactions   Crestor [Rosuvastatin] Hives and Other (See Comments)    welps   Sulfonamide Derivatives Hives    As a child   Contrast Media [Iodinated Diagnostic Agents] Rash and Other (See Comments)    Severe rash    Immunization History  Administered Date(s) Administered   Influenza Split 02/20/2011, 02/20/2012, 03/03/2014   Influenza Whole 02/17/2017   Influenza, High Dose Seasonal PF 02/09/2018   Influenza,inj,Quad PF,6+ Mos 02/19/2013   Influenza-Unspecified 02/04/2017   Pneumococcal Conjugate-13 02/05/2017   Pneumococcal Polysaccharide-23 06/01/2012, 03/03/2014   Td 06/03/2002   Tdap 11/10/2012    Family History  Problem Relation Age of Onset   Heart attack Mother 19   Heart attack Father 2   Cancer Brother      Current Outpatient Medications:    Ascorbic Acid (VITAMIN C) 1000 MG tablet, Take 1,000 mg by mouth daily., Disp: , Rfl:    aspirin EC 81 MG tablet, Take 324 mg by mouth daily. , Disp:  , Rfl:    benzonatate (TESSALON) 200 MG capsule, Take 1 capsule (200 mg total) by mouth 3 (three) times daily as needed for cough., Disp: 30 capsule, Rfl: 1   Cholecalciferol (VITAMIN D3 PO), Take 1 capsule by mouth daily. , Disp: , Rfl:    clonazePAM (KLONOPIN) 1 MG tablet, Take 1 mg by mouth as directed., Disp: , Rfl:    COENZYME Q10 PO, Take 1 capsule by mouth 2 (two) times daily. , Disp: , Rfl:    diphenhydrAMINE (BENADRYL) 50 MG capsule, Take 50 mg by mouth as needed. , Disp: , Rfl:    doxepin (SINEQUAN) 25 MG capsule, Take 1 capsule (25 mg total) by mouth at bedtime., Disp: 30 capsule, Rfl: 5   EVENING PRIMROSE OIL PO, Take 2 capsules by mouth 2 (two) times daily., Disp: , Rfl:    gabapentin (NEURONTIN) 100 MG capsule, Take 100-300 mg by mouth See admin instructions. Take 100 mg by mouth daily and take 300 mg by mouth at bedtime, Disp: , Rfl:    gabapentin (NEURONTIN) 300 MG capsule, Take 1 capsule (300 mg total) by mouth daily., Disp: 180 capsule, Rfl: 1   GLATOPA 20 MG/ML SOSY injection, USE 1 INJECTION SUBCUTANEOUSLY ONCE A DAY, Disp: 30 mL, Rfl: 3   guaiFENesin (MUCINEX) 600 MG 12 hr tablet, Take 600 mg by mouth 2 (two) times daily as needed for cough or to loosen phlegm. , Disp: , Rfl:    Homeopathic Products (SIMILASAN DRY EYE RELIEF OP), Place 1 drop into both eyes 2 (two) times daily., Disp: , Rfl:    INCRUSE ELLIPTA 62.5 MCG/INH AEPB, INHALE 1 PUFF INTO THE LUNGS  ONCE DAILY, Disp: 60 each, Rfl: 4   ipratropium (ATROVENT) 0.02 % nebulizer solution, Take 2.5 mLs (0.5 mg total) by nebulization 4 (four) times daily., Disp: 120 mL, Rfl: 12   LINZESS 290 MCG CAPS capsule, Take 290 mcg by mouth daily before breakfast. , Disp: , Rfl:    losartan (COZAAR) 25 MG tablet, TAKE 1 TABLET BY MOUTH ONCE A DAY, Disp: 180 tablet, Rfl: 1   lovastatin (MEVACOR) 40 MG tablet, TAKE 1 TABLET BY MOUTH ONCE DAILY EVERY EVENING, Disp: 90 tablet, Rfl: 1   metoprolol succinate (TOPROL-XL) 25  MG 24 hr tablet, Take 25 mg by mouth daily., Disp: , Rfl:    modafinil (PROVIGIL) 200 MG tablet, TAKE 1 TABLET BY MOUTH TWICE A DAY, Disp: 60 tablet, Rfl: 5   MOTEGRITY 2 MG TABS, Take 1 tablet by mouth daily., Disp: , Rfl:    Multiple Vitamin (MULTIVITAMIN WITH MINERALS) TABS tablet, Take 1 tablet by mouth daily., Disp: , Rfl:    nitroGLYCERIN (NITROSTAT) 0.4 MG SL tablet, DISSOLVE 1 TABLET UNDER TONGUE AS NEEDEDFOR CHEST PAIN. MAY REPEAT 5 MINUTES APART 3 TIMES IF NEEDED, Disp: 25 tablet, Rfl: 3   NUCYNTA ER 50 MG TB12, Take 50 mg by mouth every 12 (twelve) hours. , Disp: , Rfl:    Omega-3 Fatty Acids (OMEGA 3 PO), Take 1 capsule by mouth daily., Disp: , Rfl:    omeprazole (PRILOSEC) 20 MG capsule, Take 1 capsule (20 mg total) by mouth daily., Disp: 30 capsule, Rfl: 3   oxyCODONE-acetaminophen (PERCOCET) 5-325 MG per tablet, Take 1 tablet by mouth every 4 (four) hours as needed for severe pain. , Disp: , Rfl:    polyethylene glycol powder (GLYCOLAX/MIRALAX) powder, Take 17 g by mouth daily as needed for moderate constipation. , Disp: , Rfl:    PRISTIQ 50 MG 24 hr tablet, Take 50 mg by mouth at bedtime. , Disp: , Rfl:    PROAIR HFA 108 (90 Base) MCG/ACT inhaler, INHALE 2 PUFFS INTO THE LUNGS EVERY 6 HOURS AS NEEDED FOR WHEEZING ORSHORTNESS OF BREATH, Disp: 8.5 g, Rfl: 3   vitamin B-12 (CYANOCOBALAMIN) 1000 MCG tablet, Take 1,500 mcg by mouth daily., Disp: , Rfl:    isosorbide mononitrate (IMDUR) 60 MG 24 hr tablet, Take 1.5 tablets (90 mg total) by mouth daily., Disp: 135 tablet, Rfl: 3      Objective:   Vitals:   02/01/19 1009  BP: 128/74  Pulse: 82  Temp: 97.9 F (36.6 C)  SpO2: 97%  Weight: 120 lb 12.8 oz (54.8 kg)  Height: 5\' 1"  (1.549 m)    Estimated body mass index is 22.82 kg/m as calculated from the following:   Height as of this encounter: 5\' 1"  (1.549 m).   Weight as of this encounter: 120 lb 12.8 oz (54.8 kg).  @WEIGHTCHANGE @  [employer]   02/01/19  1009  Weight: 120 lb 12.8 oz (54.8 kg)     Physical Exam  General Appearance:    Alert, cooperative, no distress, appears stated age - yes , Deconditioned looking - no , OBESE  - no, Sitting on Wheelchair -  no  Head:    Normocephalic, without obvious abnormality, atraumatic  Eyes:    PERRL, conjunctiva/corneas clear,  Ears:    Normal TM's and external ear canals, both ears  Nose:   Nares normal, septum midline, mucosa normal, no drainage    or sinus tenderness. OXYGEN ON  - no . Patient is @ ra   Throat:  Lips, mucosa, and tongue normal; teeth and gums normal. Cyanosis on lips - no  Neck:   Supple, symmetrical, trachea midline, no adenopathy;    thyroid:  no enlargement/tenderness/nodules; no carotid   bruit or JVD  Back:     Symmetric, no curvature, ROM normal, no CVA tenderness  Lungs:     Distress - no , Wheeze no, Barrell Chest - no, Purse lip breathing - no, Crackles - no   Chest Wall:    No tenderness or deformity.    Heart:    Regular rate and rhythm, S1 and S2 normal, no rub   or gallop, Murmur - no  Breast Exam:    NOT DONE  Abdomen:     Soft, non-tender, bowel sounds active all four quadrants,    no masses, no organomegaly. Visceral obesity - no  Genitalia:   NOT DONE  Rectal:   NOT DONE  Extremities:   Extremities - normal, Has Cane - no, Clubbing - no, Edema - no  Pulses:   2+ and symmetric all extremities  Skin:   Stigmata of Connective Tissue Disease - no  Lymph nodes:   Cervical, supraclavicular, and axillary nodes normal  Psychiatric:  Neurologic:   Pleasant - yes, Anxious - no, Flat affect - no  CAm-ICU - neg, Alert and Oriented x 3 - yes, Moves all 4s - yes, Speech - normal, Cognition - intact           Assessment:       ICD-10-CM   1. Other emphysema ([place])  J43.8   2. Pulmonary nodules  R91.8        Plan:     Patient Instructions  Other emphysema ([place])  - Resume incruse daily scheduled and do nebulizer as needed  - compliance important -  high dose flu shot 02/01/2019   Pulmonary nodules - small right upper lobe and right lower lobe  -increased feb 2020 -> Aug 2020 but glad you are feeling asymptomatic - respect your decision for expectant followup for now - so will hold off bronchoscopy at this current time  - repeat CT chest without contrast in 6 months - if still growing then definite bronchoscopy  Followup - 6 months or sooner if needed; CAT score - return or call sooner if needed        SIGNATURE    Dr. Brand Males, M.D., F.C.C.P,  Pulmonary and Critical Care Medicine Staff Physician, [place] Director - Interstitial Lung Disease  Program  Pulmonary [place] [facility] [place], [place], [address]  Pager: [phone], If no answer or between  15:00h - 7:00h: call [phone] Telephone: [phone]  10:38 AM 02/01/2019

## 2019-02-01 NOTE — Patient Instructions (Addendum)
Other emphysema ([place])  - Resume incruse daily scheduled and do nebulizer as needed  - compliance important - high dose flu shot 02/01/2019   Pulmonary nodules - small right upper lobe and right lower lobe  -increased feb 2020 -> Aug 2020 but glad you are feeling asymptomatic - respect your decision for expectant followup for now - so will hold off bronchoscopy at this current time  - repeat CT chest without contrast in 6 months - if still growing then definite bronchoscopy  Followup - 6 months or sooner if needed; CAT score - return or call sooner if needed

## 2019-02-02 ENCOUNTER — Encounter: Payer: Self-pay | Admitting: *Deleted

## 2019-02-02 NOTE — Progress Notes (Addendum)
[name] [name] - PA Case ID: [id number] - Rx #: [id number] Need help? Call us at [phone] Outcome Approvedtoday Request Reference Number: [id number]. MODAFINIL TAB 200MG  is approved through 08/02/2019. For further questions, call [phone]. Drug Modafinil 200MG  tablets Form [employer] Medicare Part D Electronic Prior Authorization Form (2017 NCPDP) Original Claim Info [id number] 01Provide Exception Process Printed Notice 02Transition N/A,Dr Submit ePA [email or web address] [id number]

## 2019-02-04 ENCOUNTER — Telehealth: Payer: Self-pay | Admitting: Internal Medicine

## 2019-02-04 NOTE — Telephone Encounter (Signed)
Attempted to return call to patient. No answer.  Left VM to call us

## 2019-02-04 NOTE — Telephone Encounter (Signed)
Returned call to patient. Patient states Dr. Chase Caller had mentioned she needs a lung biopsy and she needed to think about it.  States she is completing a Living Will and knows she has serious heart problems and says she has been told she has about 2 years to live.   She wanted to let Dr. Chase Caller know that she is not trying to be difficult by not doing the lung biopsy but she does not wish to proceed with it unless he can assure her this would lead to an improvement in her quality of life. She explains she watched her sister suffer for a long time before her death and this would not be her wishes for herself.     Advised patient we would send Dr. Chase Caller notification of her comments on the lung biopsy and would notify her of his reply.  Patient states she does not want to go through procedures or treatments if that will not improve her condition since her heart health is so poor.    Route to Dr. Chase Caller for review/recommendations.

## 2019-02-05 NOTE — Telephone Encounter (Signed)
Returned call to patient and informed her of Dr. Golden Pop understanding of her decision. She thanked [place] for supporting her decision and being so kind to her.  Patient will follow up as planned and with CT scan. Nothing further needed today.

## 2019-02-05 NOTE — Telephone Encounter (Signed)
Understood and support her sentiment. Will go with followup CT plan form prior vuisit

## 2019-02-17 ENCOUNTER — Other Ambulatory Visit: Payer: Self-pay | Admitting: Internal Medicine

## 2019-02-22 ENCOUNTER — Telehealth: Payer: Self-pay | Admitting: Cardiology

## 2019-02-22 MED ORDER — METOPROLOL SUCCINATE ER 25 MG PO TB24: 25.0000 mg | ORAL_TABLET | Freq: Every day | ORAL | 2 refills | Status: DC

## 2019-02-22 NOTE — Telephone Encounter (Signed)
Patient called wanting to know if she can go back on metoprolol succinate (TOPROL-XL) 25 MG 24 hr tablet  [phone])

## 2019-02-22 NOTE — Telephone Encounter (Signed)
Patient says she was already given approval to stay on this medication but she needed a refill sent to her pharmacy. Advised that refill will be sent.

## 2019-03-04 NOTE — Progress Notes (Signed)
NEUROLOGY FOLLOW UP OFFICE NOTE  [name] [id number]  HISTORY OF PRESENT ILLNESS: [name] is a 73 year old right-handed female with CAD,COPD,emphysema, CHF, thyroid disease, IBS, arthritis, depression, fibromyalgia who follows up for MS.  UPDATE: Current DMT:  Copaxone Current medications:  Gabapentin 100mg  at bedtime (300mg  causes drowsiness) Clonazepam 1mg  during day and 2mg  at bedtime for spasms She is supposed to be taking 4000 IU D3 daily but states she is taking 8000 IU daily Provigil 200mg   Vision:Permanent vision loss in left eye Motor: Stable. Intermittent head shaking Sensory: stable Pain: Pain in arms and legs. Lumbar radiculopathy Gait: Sometimes requires ambulation with cane or walker. Bowel/Bladder: Stable Fatigue: Yes Cognition: Short-term memory deficits. Mood: Depressed. She reports difficulty with speech. She has trouble with pronouncing them. It has been ongoing for several months but has gotten worse.   HISTORY: First symptom occurred in 1995, when she developed vision loss in the left eye, as well as left eye pain.MRI of the brain with and without contrast was performed on 03/28/95 revealed 10-12 periventricular white matter lesions and one lesion in the corpus callosum, suspicious for demyelination.There was also enhancement of the left optic nerve.She was referred to Dr. Delphia Grates, an MS specialist.Lumbar puncture was performed, revealing no oligoclonal bands or myelin basic protein.She has not required hospitalization or steroids since the late 1990s.She was on Betaseron and later Copaxone.Copaxone was discontinued due to longstanding stability, but due to fatigue and cognitive issues, it was restarted in 2014.Her continued symptoms are episodes of worsening fatigue, as well as cognitive issues.Sometimes, she has difficulty thinking clearly.She will get disoriented while driving.Other times, she has  word-finding difficulties.She once overpaid a bill.She still performs all ADLs, including paying the bills.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  Past disease-modifying agents:Betaseron (elevated LFTs and necrotic injection sites)  For fatigue, she takes Provigil.Adderal was ineffective.Amantadine was ineffective. For pain in the arms and legs, she takes gabapentin 300mg  at bedtime.She reportedly has lumbar radiculopathy which also contributes to the leg pain. Initially, she reportedly had significant spasticity.She was on oral baclofen which did not help.At one point, a baclofen pump was entertained.She currently takes clonazepam 1mg  during the day and 2mg  at bedtime, which is effective.  Other imaging: 05/07/95 MRI LUMBAR WU:JWJX disc bulging at L4-5 without disc herniation or central canal stenosis. 03/09/96 MRI BRAIN W/WO (comparing to MRI from 03/28/95):slight increase in number of white matter lesions adjacent to the atrium of the right lateral ventricle, otherwise unchanged. 03/28/98 MRI BRAIN W/WO (comparing to MRI from 03/25/97):several subtle areas of increased signal in the deep white matter, not as prevalent as on prior study.No abnormal enhancement. 08/11/13 MRI of brain with and without contrast :multiple supratentorial and infratentorial T2 hyperintensities, but no post-contrast enhancement.However, this was not compared to prior imaging. 01/28/14 MRI Cervical spine without contrast:spinal cord unremarkable.[name] MRI of brain with and without contrast from 08/17/14 is stable MRI of brain without contrast from 05/08/15 is stable. MRI of brain with and without contrast from 03/04/17 unchanged from prior study on 05/08/15.  PAST MEDICAL HISTORY: Past Medical History:  Diagnosis Date   Anxiety    Aortic  atherosclerosis ([place]) 09/29/2017   High Res CT in 9/18: aortic atherosclerosis; coronary artery calcification   Cervical disc disease    BULGING   Chronic combined systolic and diastolic CHF (congestive  heart failure) ([place]) 09/29/2017   Echo 7/18: Moderate concentric LVH, grade 1 diastolic dysfunction, abnormal septal wall motion due to LBBB, moderate diffuse HK, EF 35-40, atrial septal aneurysm with possible PFO, mild TR // Echo 5/19: EF 50-55, normal wall motion, grade 1 diastolic dysfunction, trivial TR   Complication of anesthesia    SLOW TO AWAKEN AFTER LAST COLONSCOPY   Coronary artery disease 05/09/2015   LHC 10/12: EF 55, OM1 80-90 - difficult to engage >> treated medically // Nuc 8/18: EF 40, no ischemia   Depression    DJD (degenerative joint disease)    OA   Dysrhythmia    Not clear where this diagnosis came from   Emphysema of lung ([place])    Dr. Chase Caller   Fibromyalgia    H/O: liver disease 74 YRS AGO   tranamanitis due to previous interferon - LFTs improved off of   History of MI (myocardial infarction) FEW YRS AGO   History of TIA (transient ischemic attack)    Hx of colonic polyps    Hyperlipidemia    intol to statin - myalgias // ESPERION trial - patient stopped   Multiple sclerosis ([place])    Narcotic dependence ([place])    hx of benzodiazepine and narcotic   Osteoporosis    Positive H. pylori test    Urinary incontinence     MEDICATIONS: Current Outpatient Medications on File Prior to Visit  Medication Sig Dispense Refill   Ascorbic Acid (VITAMIN C) 1000 MG tablet Take 1,000 mg by mouth daily.     aspirin EC 81 MG tablet Take 324 mg by mouth daily.      benzonatate (TESSALON) 200 MG capsule Take 1 capsule (200 mg total) by mouth 3 (three) times daily as needed for cough. 30 capsule 1   Cholecalciferol (VITAMIN D3 PO) Take 1 capsule by mouth daily.      clonazePAM (KLONOPIN) 1 MG tablet Take 1 mg by mouth as directed.     COENZYME Q10 PO  Take 1 capsule by mouth 2 (two) times daily.      diphenhydrAMINE (BENADRYL) 50 MG capsule Take 50 mg by mouth as needed.      doxepin (SINEQUAN) 25 MG capsule Take 1 capsule (25 mg total) by mouth at bedtime. 30 capsule 5   EVENING PRIMROSE OIL PO Take 2 capsules by mouth 2 (two) times daily.     gabapentin (NEURONTIN) 100 MG capsule Take 100-300 mg by mouth See admin instructions. Take 100 mg by mouth daily and take 300 mg by mouth at bedtime     gabapentin (NEURONTIN) 300 MG capsule Take 1 capsule (300 mg total) by mouth daily. 180 capsule 1   GLATOPA 20 MG/ML SOSY injection USE 1 INJECTION SUBCUTANEOUSLY ONCE A DAY 30 mL 3   guaiFENesin (MUCINEX) 600 MG 12 hr tablet Take 600 mg by mouth 2 (two) times daily as needed for cough or to loosen phlegm.      Homeopathic Products (SIMILASAN DRY EYE RELIEF OP) Place 1 drop into both eyes 2 (two) times daily.     INCRUSE ELLIPTA 62.5 MCG/INH AEPB INHALE 1 PUFF INTO THE LUNGS ONCE DAILY 60 each 4   ipratropium (ATROVENT) 0.02 % nebulizer solution Take 2.5 mLs (0.5 mg total) by nebulization 4 (four) times daily. 120 mL 12   isosorbide mononitrate (IMDUR) 60 MG 24 hr tablet Take 1.5 tablets (90 mg total) by mouth daily. 135 tablet 3   LINZESS 290 MCG CAPS capsule Take 290 mcg  by mouth daily before breakfast.      losartan (COZAAR) 25 MG tablet TAKE 1 TABLET BY MOUTH ONCE A DAY 180 tablet 1   lovastatin (MEVACOR) 40 MG tablet TAKE 1 TABLET BY MOUTH ONCE DAILY EVERY EVENING 90 tablet 1   metoprolol succinate (TOPROL-XL) 25 MG 24 hr tablet Take 1 tablet (25 mg total) by mouth daily. 90 tablet 2   modafinil (PROVIGIL) 200 MG tablet TAKE 1 TABLET BY MOUTH TWICE A DAY 60 tablet 5   MOTEGRITY 2 MG TABS Take 1 tablet by mouth daily.     Multiple Vitamin (MULTIVITAMIN WITH MINERALS) TABS tablet Take 1 tablet by mouth daily.     nitroGLYCERIN (NITROSTAT) 0.4 MG SL tablet DISSOLVE 1 TABLET UNDER TONGUE AS NEEDEDFOR CHEST PAIN. MAY REPEAT 5 MINUTES  APART 3 TIMES IF NEEDED 25 tablet 3   NUCYNTA ER 50 MG TB12 Take 50 mg by mouth every 12 (twelve) hours.      Omega-3 Fatty Acids (OMEGA 3 PO) Take 1 capsule by mouth daily.     omeprazole (PRILOSEC) 20 MG capsule Take 1 capsule (20 mg total) by mouth daily. 30 capsule 3   oxyCODONE-acetaminophen (PERCOCET) 5-325 MG per tablet Take 1 tablet by mouth every 4 (four) hours as needed for severe pain.      polyethylene glycol powder (GLYCOLAX/MIRALAX) powder Take 17 g by mouth daily as needed for moderate constipation.      PRISTIQ 50 MG 24 hr tablet Take 50 mg by mouth at bedtime.      PROAIR HFA 108 (90 Base) MCG/ACT inhaler INHALE 2 PUFFS INTO THE LUNGS EVERY 6 HOURS AS NEEDED FOR WHEEZING ORSHORTNESS OF BREATH 8.5 g 3   vitamin B-12 (CYANOCOBALAMIN) 1000 MCG tablet Take 1,500 mcg by mouth daily.     No current facility-administered medications on file prior to visit.     ALLERGIES: Allergies  Allergen Reactions   Crestor [Rosuvastatin] Hives and Other (See Comments)    welps   Sulfonamide Derivatives Hives    As a child   Contrast Media [Iodinated Diagnostic Agents] Rash and Other (See Comments)    Severe rash    FAMILY HISTORY: Family History  Problem Relation Age of Onset   Heart attack Mother 16   Heart attack Father 80   Cancer Brother     SOCIAL HISTORY: Social History   Socioeconomic History   Marital status: Divorced    Spouse name: Not on file   Number of children: 2   Years of education: 12th   Highest education level: Not on file  Occupational History   Occupation: disability    Employer: RETIRED  Social Designer, fashion/clothing strain: Not on file   Food insecurity    Worry: Not on file    Inability: Not on file   Transportation needs    Medical: Not on file    Non-medical: Not on file  Tobacco Use   Smoking status: Former Smoker    Packs/day: 1.00    Years: 48.00    Pack years: 48.00    Types: Cigarettes    Quit date:  11/16/2009    Years since quitting: 9.3   Smokeless tobacco: Never Used  Substance and Sexual Activity   Alcohol use: No   Drug use: No   Sexual activity: Not on file  Lifestyle   Physical activity    Days per week: Not on file    Minutes per session: Not on file  Stress: Not on file  Relationships   Social connections    Talks on phone: Not on file    Gets together: Not on file    Attends religious service: Not on file    Active member of club or organization: Not on file    Attends meetings of clubs or organizations: Not on file    Relationship status: Not on file   Intimate partner violence    Fear of current or ex partner: Not on file    Emotionally abused: Not on file    Physically abused: Not on file    Forced sexual activity: Not on file  Other Topics Concern   Not on file  Social History Narrative   Patient lives at home alone. - [place], [place]   Caffeine Use: rarely   Retired - Used to be a Dance movement psychotherapist; owned a Company secretary   Married; 2 children; 1 granddaughter    REVIEW OF SYSTEMS: Constitutional: No fevers, chills, or sweats, no generalized fatigue, change in appetite Eyes: No visual changes, double vision, eye pain Ear, nose and throat: No hearing loss, ear pain, nasal congestion, sore throat Cardiovascular: No chest pain, palpitations Respiratory:  No shortness of breath at rest or with exertion, wheezes GastrointestinaI: No nausea, vomiting, diarrhea, abdominal pain, fecal incontinence Genitourinary:  No dysuria, urinary retention or frequency Musculoskeletal:  No neck pain, back pain Integumentary: No rash, pruritus, skin lesions Neurological: as above Psychiatric: No depression, insomnia, anxiety Endocrine: No palpitations, fatigue, diaphoresis, mood swings, change in appetite, change in weight, increased thirst Hematologic/Lymphatic:  No purpura, petechiae. Allergic/Immunologic: no itchy/runny eyes, nasal congestion, recent allergic  reactions, rashes  PHYSICAL EXAM: Blood pressure 126/80, pulse (!) 105, temperature 98 F (36.7 C), resp. rate 14, height 5\' 1"  (1.549 m), weight 120 lb (54.4 kg), SpO2 95 %. General: No acute distress.  Patient appears well-groomed.   Head:  Normocephalic/atraumatic Eyes:  Fundi examined but not visualized Neck: supple, no paraspinal tenderness, full range of motion Heart:  Regular rate and rhythm Lungs:  Clear to auscultation bilaterally Back: No paraspinal tenderness Neurological Exam: alert and oriented to person, place, and time. Attention span and concentration intact, recent and remote memory intact, fund of knowledge intact.  Speech fluent and not dysarthric, language intact.  Left monocular vision loss.  Otherwise, CN II-XII intact. Bulk and tone normal, muscle strength 5/5 throughout.  Sensation to light touch, temperature and vibration intact.  Deep tendon reflexes 3+ throughout, toes downgoing.  Finger to nose and heel to shin testing intact.  Gait normal, Romberg with sway.  IMPRESSION: Multiple sclerosis  PLAN: Copaxone for DMT Gabapentin for pain Provigil for MS fatigue Decrease D3 to 2000 IU daily and repeat in 6 months. Follow up in 6 months.  15 minutes spent face to face with patient, over 50% spent discussing management.   Metta Clines, DO  CC: Donald Prose, MD

## 2019-03-05 ENCOUNTER — Encounter: Payer: Self-pay | Admitting: Neurology

## 2019-03-05 ENCOUNTER — Other Ambulatory Visit: Payer: Self-pay | Admitting: *Deleted

## 2019-03-05 ENCOUNTER — Other Ambulatory Visit: Payer: Self-pay

## 2019-03-05 ENCOUNTER — Ambulatory Visit: Payer: Medicare Other | Admitting: Neurology

## 2019-03-05 VITALS — BP 126/80 | HR 105 | Temp 98.0°F | Resp 14 | Ht 61.0 in | Wt 120.0 lb

## 2019-03-05 DIAGNOSIS — G35 Multiple sclerosis: Secondary | ICD-10-CM | POA: Diagnosis not present

## 2019-03-05 DIAGNOSIS — E559 Vitamin D deficiency, unspecified: Secondary | ICD-10-CM

## 2019-03-05 NOTE — Patient Instructions (Signed)
1.  Continue Copaxone 2.  Take only 2000 IU of D3 daily.  Will repeat level in 61months 3.  Follow up in 7months.

## 2019-03-12 ENCOUNTER — Telehealth: Payer: Self-pay | Admitting: Internal Medicine

## 2019-03-12 MED ORDER — INCRUSE ELLIPTA 62.5 MCG/INH IN AEPB: 1.0000 | INHALATION_SPRAY | Freq: Every day | RESPIRATORY_TRACT | 0 refills | Status: DC

## 2019-03-12 NOTE — Telephone Encounter (Signed)
Called and spoke w/ pt. Pt states she has misplaced her Incruse and is requesting samples to last her until her next refill. I inquired if pt has any trouble accessing the medication as far as affordability is concerned, which pt denied. Pt states her next refill is within 15-20 days, so I let her know we could provide her with 3 samples (each sample lasts 7 days); however, to let us know if she has trouble affording her medication so that we can provide assistance and/or change inhaler therapy if needed. Pt expressed understanding with no additional questions. Samples have been placed up front and documented per protocol. Pt states she plans to pick them up on Monday 03/15/2019. Nothing further needed at this time.

## 2019-03-17 ENCOUNTER — Telehealth: Payer: Self-pay | Admitting: Internal Medicine

## 2019-03-17 ENCOUNTER — Other Ambulatory Visit: Payer: Self-pay | Admitting: Primary Care

## 2019-03-17 ENCOUNTER — Other Ambulatory Visit: Payer: Self-pay | Admitting: Internal Medicine

## 2019-03-17 NOTE — Telephone Encounter (Signed)
Called and spoke w/ pt. Pt states MR had discussed her having a lung biopsy done in the past, which she had originally declined; however, she states she has thought about it and would like to proceed with getting a lung biopsy. She states she trusts MR's judgement, further noting that if it will help her feel better then it is worth it.   Routing to MR for follow-up. MR, please advise. Thank you.

## 2019-03-19 NOTE — Telephone Encounter (Signed)
Spoke with [name]. She has been scheduled to see MR on 03/23/2019 at 1000. Nothing further was needed.

## 2019-03-19 NOTE — Telephone Encounter (Signed)
Please advise 

## 2019-03-19 NOTE — Telephone Encounter (Signed)
MR, please advise.

## 2019-03-19 NOTE — Telephone Encounter (Signed)
Was actually BAL (not bx) for rule out [place] give her first available with me so I can dw her face to face and then schedule

## 2019-03-19 NOTE — Telephone Encounter (Signed)
Pt called back stating she wants to do biopsy.  Please advise.  [phone]

## 2019-03-23 ENCOUNTER — Encounter: Payer: Self-pay | Admitting: Internal Medicine

## 2019-03-23 ENCOUNTER — Other Ambulatory Visit: Payer: Self-pay

## 2019-03-23 ENCOUNTER — Ambulatory Visit: Payer: Medicare Other | Admitting: Internal Medicine

## 2019-03-23 VITALS — BP 112/70 | HR 86 | Temp 97.1°F | Ht 61.0 in | Wt 120.2 lb

## 2019-03-23 DIAGNOSIS — Z23 Encounter for immunization: Secondary | ICD-10-CM

## 2019-03-23 DIAGNOSIS — R918 Other nonspecific abnormal finding of lung field: Secondary | ICD-10-CM

## 2019-03-23 NOTE — Progress Notes (Signed)
@Patient  ID: [name], female    DOB: 1946/04/23, 73 y.o.   MRN: [id number]  Chief Complaint  Patient presents with   Follow-up    Emphysema     Referring provider: Donald Prose, MD  HPI: 73 year old female former smoker followed for emphysema, and lung nodule  TEST  High-resolution CT chest September 2018 showed 16 mm left upper lobe nodule unchanged dating back to September 2014 consistent with benign etiology, mild emphysema   12/01/2017 Follow up : Emphysema  Patient returns for a 39-month follow-up.  Patient was seen last visit.  She was started on ipratropium nebulizers twice daily.  Says that this has really helped her breathing.  She feels less short of breath.  She has been able to be more active. Feels that this is the best she is done in a long time.  She is actually able to get out and do things.  She is very happy with this.  She is now able to walk her dog. PFT today showed normal lung function with FEV1 at 109%, ratio 82, FVC 100%, DLCO 87%.  OV 05/14/2018  Subjective:  Patient ID: [name], female , DOB: [birth year] , age 8 y.o. , MRN: [id number] , ADDRESS: [address] [place] [facility] [address]   05/14/2018 -   Chief Complaint  Patient presents with   Follow-up    pt reports of sob with exertion & chest discomfort.    + HPI [name] 73 y.o. -presents for follow-up of emphysema associated with bilateral lower lobe crackles.  No evidence of ILD on September 2018 CT chest.  Overall she is stable.  COPD CAT score is only 10.  She is on some kind of a bronchodilator regimen and she is not fully compliant with it.  She says she will step up her compliance.  She says she is up-to-date with the flu shot but we do not have a date on this.  She says she is depressed because it is Christmas time.  She also thinks her cardiologist gave her a bad prognosis.  She does not know what the diseases.      Patient ID: [name], female     DOB: [birth year], 73 y.o.   MRN: [id number]  Chief Complaint  Patient presents with   Chest Pain    Left sided ribs have been hurting for the past few days. Thinks she may have cracked a rib with a recent cold.     Referring provider: Donald Prose, MD  HPI: 73 year old female, former smoker quit 2011(48 pack year hx). PMH COPD, fibromyalgia, MS, heart failure. Patient of Dr. Chase Caller, last see by NP on 06/02/18. CXR showed no evidence of PNA or CHF. Some chronic bronchitic changes related to smoking. HRCT in 2018 showed no significant findings except for emphysema.   07/14/2018 Patient presents today for acute visit with continued complaints of left pleuritic pain. Reports that she had a bad cough which has slowly improved. Left rib cage is sore to touch and skin burns. No rash. Takes percocet for chronic pain/fibromyalgia, unsure if it helps. LFTs were normal. Ordered for CT abd but patient cancelled d/t cough. She has not been taking incruse inhaler or Atrovent nebulizer as often as it is prescribed. EKG today showed SR with left bundle branch block, unchanged from previous in May 2019. Denies shortness of breath.    OV 08/04/2018  Subjective:  Patient ID: [name],  female , DOB: November 20, 1945 , age 69 y.o. , MRN: [id number] , ADDRESS: [address] [place] [facility] [address]   08/04/2018 -   Chief Complaint  Patient presents with   Follow-up    Pt states she is still taking abx after recent OV with Derl Barrow, NP. Pt denies any current complaints of SOB but states she has had an occ cough. Pt states she still has been having problems with pain in her chest.     HPI [name] 73 y.o. -presents for follow-up.  She has COPD and fibromyalgia.  She is a Designer, jewellery recently and had multitude of complaints including pleuritic chest pain on the left side.  This resulted in a CT scan of the chest and also autoimmune work-up which were negative.  The CT scan shows right upper  lobe scattered pulmonary nodules are extremely small.  This on the contralateral side to the pain.  Today she tells me that the left-sided pleuritic pain is resolved.  She says since that she is having mood issues.  She also is constantly shaking her head which she says is a chronic intermittent issue.  Last visit nurse practitioner gave her Incruse inhaler but she has no idea that this was even dispensed to her prescribed for her.  She thinks our office felt short in sending the prescription to the pharmacy.  Instead she is using albuterol as needed.  COPD CAT score is minimal and symptom score is documented below.       IMPRESSION: Interval development of cluster of small nodules seen in the lateral portion of right upper lobe concerning for atypical infection such as mycobacterium, or chronic sequela of previous such infection. Clinical correlation is recommended.  Stable pleural-based irregular density noted in left lung apex most consistent with benign scarring.  Minimal bilateral posterior basilar subsegmental atelectasis or scarring is noted.  Mild coronary artery calcifications are noted.  Aortic Atherosclerosis (ICD10-I70.0).   Electronically Signed   By: Marijo Conception, M.D.   On: 07/15/2018 16:05   [name], [name] (MRN [id number]) as of 08/04/2018 12:46  Ref. Range 07/17/2018 12:08 07/17/2018 12:08  Anti Nuclear Antibody(ANA) Latest Ref Range: Negative  NEGATIVE Negative  ANA Titer 1 Unknown  Negative  Cyclic Citrullin Peptide Ab Latest Units: UNITS <16   dsDNA Ab Latest Ref Range: 0 - 9 IU/mL  <1  ENA RNP Ab Latest Ref Range: 0.0 - 0.9 AI  <0.2  ENA SSA (RO) Ab Latest Ref Range: 0.0 - 0.9 AI  <0.2  ENA SSB (LA) Ab Latest Ref Range: 0.0 - 0.9 AI  <0.2  Myeloperoxidase Abs Latest Units: AI <1.0   Serine Protease 3 Latest Units: AI <1.0   RA Latex Turbid. Latest Ref Range: <14 IU/mL <14   ENA SM Ab Ser-aCnc Latest Ref Range: 0.0 - 0.9 AI  <0.2    Complement C3, Serum Latest Ref Range: 82 - 167 mg/dL  117  SSA (Ro) (ENA) Antibody, IgG Latest Ref Range: <1.0 NEG AI <1.0 NEG   SSB (La) (ENA) Antibody, IgG Latest Ref Range: <1.0 NEG AI <1.0 NEG   Scleroderma (Scl-70) (ENA) Antibody, IgG Latest Ref Range: 0.0 - 0.9 AI <1.0 NEG 0.3    OV 02/01/2019  Subjective:  Patient ID: [name], female , DOB: [birth year] , age 49 y.o. , MRN: [id number] , ADDRESS: [address] [place] [facility] [address]   02/01/2019 -   Chief Complaint  Patient presents with  Pulmonary Emphysema    Medications are working, no breathing issues.     HPI [name] 73 y.o. -returns for follow-up of her mild emphysema and pulmonary nodules.  She is on anticholinergic inhaler.  She tells me that overall she is actually feeling really great.  Denies any cough or sputum production or chills or weight loss when asked but answered differenty in CAT score She is deeply appreciative of our care.  She has reluctantly agreed for flu shot.  She says she does not trust the [employer].  I explained to her that the flu shots are fairly approved per the manufacturer is primary.  Explained to her the benefits, risks and limitations.  In terms of pulmonary nodules she has follow-up CT scan of the chest August 2020 that I personally visualized.  In the last 6 months she has more micronodules in the right lower lobe.  Radiologist is concerned about MAI.  I did explain this to her.  Currently because of the [place] pandemic she is nervous about coming to the hospital for bronchoscopy.  In addition she is also feeling relatively asymptomatic.  Therefore she wants to adopt an expectant approach.  Clearly if the infiltrates are getting worse again or she gets symptomatic then she is willing to come in for bronchoscopy.  IMPRESSION: 1. There is extensive, clustered centrilobular and tree-in-bud pulmonary nodularity bilaterally with mild associated tubular bronchiectasis  and occasional plugging. There is diffusely new and increased nodularity and new and increased small consolidations, particularly in the dependent right lower lobe (series 5, image 203). 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  2.  Aortic atherosclerosis and coronary artery disease.   Electronically Signed   By: Eddie Candle M.D.   On: 01/22/2019 14:28  xxxxxxxxxxxxxxxxxxxxxxxxxxxxxxxxxxxxxxxxxxxxxxxxx OV 03/23/2019  Subjective:  Patient ID: [name], female , DOB: 02/23/1946 , age 71 y.o. , MRN: [id number] , ADDRESS: [address] [place] [facility] [address]   03/23/2019 -   Chief Complaint  Patient presents with   Follow-up     HPI [name] 65 y.o. -      CAT COPD Symptom & Quality of Life Score ([place]) 0 is no burden. 5 is highest burden 05/14/2018 0 08/04/2018  02/01/2019   Never Cough -> Cough all the time 0 0 2  No phlegm in chest -> Chest is full of phlegm 0 0 2  No chest tightness -> Chest feels very tight 0 2 3  No dyspnea for 1 flight stairs/hill -> Very dyspneic for 1 flight of stairs 3 0 22.5  No limitations for ADL at home -> Very limited with ADL at home 3 5 2   Confident leaving home -> Not at all confident leaving home 0 3 0  Sleep soundly -> Do not sleep soundly because of lung condition 0 0 0  Lots of Energy -> No energy at all 5 4 4.5  TOTAL Score (max 40)  10 11 16       ROS - per HPI     has a past medical history of Anxiety, Aortic atherosclerosis ([place]) (09/29/2017), Cervical disc disease, Chronic combined systolic and diastolic CHF (congestive heart failure) ([place]) (52/77/8242), Complication of anesthesia, Coronary artery disease (05/09/2015), Depression, DJD (degenerative joint disease), Dysrhythmia, Emphysema of lung ([place]), Fibromyalgia, H/O: liver disease (22 YRS AGO), History of MI (myocardial infarction) (FEW YRS AGO), History of TIA (transient ischemic attack),  colonic polyps, Hyperlipidemia, Multiple sclerosis ([place]), Narcotic  dependence ([place]), Osteoporosis, Positive H. pylori test, and Urinary incontinence.   reports that she quit smoking about 9 years ago. Her smoking use included cigarettes. She has a 48.00 pack-year smoking history. She has never used smokeless tobacco.  Past Surgical History:  Procedure Laterality Date   BACK SURGERY  11-2009   LOWER BACK   BOTOX INJECTION N/A 03/21/2016   Procedure: BOTOX INJECTION;  Surgeon: Wilford Corner, MD;  Location: [facility];  Service: Endoscopy;  Laterality: N/A;   CARDIAC CATHETERIZATION     UNSUCCESSFUL STENT PLACEMENT   COLONSCOPY     ESOPHAGEAL MANOMETRY N/A 10/23/2015   Procedure: ESOPHAGEAL MANOMETRY (EM);  Surgeon: Wilford Corner, MD;  Location: [facility];  Service: Endoscopy;  Laterality: N/A;   ESOPHAGOGASTRODUODENOSCOPY (EGD) WITH PROPOFOL N/A 03/21/2016   Procedure: ESOPHAGOGASTRODUODENOSCOPY (EGD) WITH PROPOFOL;  Surgeon: Wilford Corner, MD;  Location: [facility];  Service: Endoscopy;  Laterality: N/A;   RIGHT/LEFT HEART CATH AND CORONARY ANGIOGRAPHY N/A 10/02/2017   Procedure: RIGHT/LEFT HEART CATH AND CORONARY ANGIOGRAPHY;  Surgeon: Nelva Bush, MD;  Location: [place] [facility];  Service: Cardiovascular;  Laterality: N/A;   TUBAL LIGATION      Allergies  Allergen Reactions   Crestor [Rosuvastatin] Hives and Other (See Comments)    welps   Sulfonamide Derivatives Hives    As a child   Contrast Media [Iodinated Diagnostic Agents] Rash and Other (See Comments)    Severe rash    Immunization History  Administered Date(s) Administered   Fluad Quad(high Dose 65+) 02/01/2019, 03/23/2019   Influenza Split 02/20/2011, 02/20/2012, 03/03/2014   Influenza Whole 02/17/2017   Influenza, High Dose Seasonal PF 02/09/2018   Influenza,inj,Quad PF,6+ Mos 02/19/2013, 02/01/2019   Influenza-Unspecified 02/04/2017   Pneumococcal Conjugate-13 02/05/2017    Pneumococcal Polysaccharide-23 06/01/2012, 03/03/2014   Td 06/03/2002   Tdap 11/10/2012    Family History  Problem Relation Age of Onset   Heart attack Mother 61   Heart attack Father 75   Cancer Brother      Current Outpatient Medications:    Ascorbic Acid (VITAMIN C) 1000 MG tablet, Take 1,000 mg by mouth daily., Disp: , Rfl:    aspirin EC 81 MG tablet, Take 324 mg by mouth daily. , Disp: , Rfl:    benzonatate (TESSALON) 200 MG capsule, Take 1 capsule (200 mg total) by mouth 3 (three) times daily as needed for cough., Disp: 30 capsule, Rfl: 1   Cholecalciferol (VITAMIN D3 PO), Take 1 capsule by mouth daily. , Disp: , Rfl:    clonazePAM (KLONOPIN) 1 MG tablet, Take 1 mg by mouth as directed., Disp: , Rfl:    COENZYME Q10 PO, Take 1 capsule by mouth 2 (two) times daily. , Disp: , Rfl:    diphenhydrAMINE (BENADRYL) 50 MG capsule, Take 50 mg by mouth as needed. , Disp: , Rfl:    doxepin (SINEQUAN) 25 MG capsule, Take 1 capsule (25 mg total) by mouth at bedtime., Disp: 30 capsule, Rfl: 5   EVENING PRIMROSE OIL PO, Take 2 capsules by mouth 2 (two) times daily., Disp: , Rfl:    gabapentin (NEURONTIN) 100 MG capsule, Take 100-300 mg by mouth See admin instructions. Take 100 mg by mouth daily and take 300 mg by mouth at bedtime, Disp: , Rfl:    gabapentin (NEURONTIN) 300 MG capsule, Take 1 capsule (300 mg total) by mouth daily., Disp: 180 capsule, Rfl: 1   GLATOPA 20 MG/ML SOSY injection, USE 1 INJECTION SUBCUTANEOUSLY ONCE A DAY, Disp: 30 mL,  Rfl: 3   guaiFENesin (MUCINEX) 600 MG 12 hr tablet, Take 600 mg by mouth 2 (two) times daily as needed for cough or to loosen phlegm. , Disp: , Rfl:    Homeopathic Products (SIMILASAN DRY EYE RELIEF OP), Place 1 drop into both eyes 2 (two) times daily., Disp: , Rfl:    INCRUSE ELLIPTA 62.5 MCG/INH AEPB, INHALE 1 PUFF INTO THE LUNGS ONCE DAILY, Disp: 30 each, Rfl: 4   ipratropium (ATROVENT) 0.02 % nebulizer solution, Take 2.5 mLs (0.5  mg total) by nebulization 4 (four) times daily., Disp: 120 mL, Rfl: 12   LINZESS 290 MCG CAPS capsule, Take 290 mcg by mouth daily before breakfast. , Disp: , Rfl:    losartan (COZAAR) 25 MG tablet, TAKE 1 TABLET BY MOUTH ONCE A DAY, Disp: 180 tablet, Rfl: 1   lovastatin (MEVACOR) 40 MG tablet, TAKE 1 TABLET BY MOUTH ONCE DAILY EVERY EVENING, Disp: 90 tablet, Rfl: 1   metoprolol succinate (TOPROL-XL) 25 MG 24 hr tablet, Take 1 tablet (25 mg total) by mouth daily., Disp: 90 tablet, Rfl: 2   modafinil (PROVIGIL) 200 MG tablet, TAKE 1 TABLET BY MOUTH TWICE A DAY, Disp: 60 tablet, Rfl: 5   MOTEGRITY 2 MG TABS, Take 1 tablet by mouth daily., Disp: , Rfl:    Multiple Vitamin (MULTIVITAMIN WITH MINERALS) TABS tablet, Take 1 tablet by mouth daily., Disp: , Rfl:    nitroGLYCERIN (NITROSTAT) 0.4 MG SL tablet, DISSOLVE 1 TABLET UNDER TONGUE AS NEEDEDFOR CHEST PAIN. MAY REPEAT 5 MINUTES APART 3 TIMES IF NEEDED, Disp: 25 tablet, Rfl: 3   NUCYNTA ER 50 MG TB12, Take 50 mg by mouth every 12 (twelve) hours. , Disp: , Rfl:    Omega-3 Fatty Acids (OMEGA 3 PO), Take 1 capsule by mouth daily., Disp: , Rfl:    omeprazole (PRILOSEC) 20 MG capsule, TAKE 1 CAPSULE BY MOUTH ONCE DAILY, Disp: 30 capsule, Rfl: 3   oxyCODONE-acetaminophen (PERCOCET) 5-325 MG per tablet, Take 1 tablet by mouth every 4 (four) hours as needed for severe pain. , Disp: , Rfl:    polyethylene glycol powder (GLYCOLAX/MIRALAX) powder, Take 17 g by mouth daily as needed for moderate constipation. , Disp: , Rfl:    PRISTIQ 50 MG 24 hr tablet, Take 50 mg by mouth at bedtime. , Disp: , Rfl:    PROAIR HFA 108 (90 Base) MCG/ACT inhaler, INHALE 2 PUFFS INTO THE LUNGS EVERY 6 HOURS AS NEEDED FOR WHEEZING ORSHORTNESS OF BREATH, Disp: 8.5 g, Rfl: 3   umeclidinium bromide (INCRUSE ELLIPTA) 62.5 MCG/INH AEPB, Inhale 1 puff into the lungs daily., Disp: 3 each, Rfl: 0   vitamin B-12 (CYANOCOBALAMIN) 1000 MCG tablet, Take 1,500 mcg by mouth daily.,  Disp: , Rfl:    isosorbide mononitrate (IMDUR) 60 MG 24 hr tablet, Take 1.5 tablets (90 mg total) by mouth daily., Disp: 135 tablet, Rfl: 3      Objective:   Vitals:   03/23/19 1009  BP: 112/70  Pulse: 86  Temp: (!) 97.1 F (36.2 C)  TempSrc: Oral  SpO2: 96%  Weight: 120 lb 3.2 oz (54.5 kg)  Height: 5\' 1"  (1.549 m)    Estimated body mass index is 22.71 kg/m as calculated from the following:   Height as of this encounter: 5\' 1"  (1.549 m).   Weight as of this encounter: 120 lb 3.2 oz (54.5 kg).  @WEIGHTCHANGE @  Filed Weights   03/23/19 1009  Weight: 120 lb 3.2 oz (54.5 kg)  Physical Exam Discussion only     Assessment:       ICD-10-CM   1. Pulmonary nodules  R91.8   2. Need for immunization against influenza  Z23 Flu Vaccine QUAD High Dose(Fluad)       Plan:     Patient Instructions  Other emphysema ([place])  - Continue incruse daily scheduled and do nebulizer as needed  - compliance important    Pulmonary nodules - small right upper lobe and right lower lobe  -increased feb 2020 -> Aug 2020 but glad you are feeling asymptomatic - we revisited bronchoscopy discussion   - after discussing procedure ad showing you YouTube video you have agreed to proceed  - wil plan for lavage procedure (small scope, no TB risk, no fluro, no biopsy) with flexibile video bronchoscopy   - dates are 11/5 thursday [place] 04/09/2019 Friday [place] (both late morning or early afternoon)  - date sare 04/19/2019 Monday. 04/20/2019 or 04/21/2019  [place] - early afternoon   Followup  -8 weeks from now to discuss results   Risks of pneumothorax, hemothorax, sedation/anesthesia complications such as cardiac or respiratory arrest or hypotension, stroke and bleeding all explained. Benefits of diagnosis but limitations of non-diagnosis also explained. Patient verbalized understanding and wished to proceed.    (> 50% of this 15 min visit spent in face to face counseling  or/and coordination of care by this undersigned MD - Dr Brand Males. This includes one or more of the following documented above: discussion of test results, diagnostic or treatment recommendations, prognosis, risks and benefits of management options, instructions, education, compliance or risk-factor reduction)    SIGNATURE    Dr. Brand Males, M.D., F.C.C.P,  Pulmonary and Critical Care Medicine Staff Physician, [place] Director - Interstitial Lung Disease  Program  P[facility] [place] [facility] [place], [place], [address]  Pager: [phone], If no answer or between  15:00h - 7:00h: call [phone] Telephone: [phone]  10:52 AM 03/23/2019

## 2019-03-23 NOTE — Patient Instructions (Addendum)
Other emphysema ([place])  - Continue incruse daily scheduled and do nebulizer as needed  - compliance important    Pulmonary nodules - small right upper lobe and right lower lobe  -increased feb 2020 -> Aug 2020 but glad you are feeling asymptomatic - we revisited bronchoscopy discussion   - after discussing procedure ad showing you YouTube video you have agreed to proceed  - wil plan for lavage procedure (small scope, no TB risk, no fluro, no biopsy) with flexibile video bronchoscopy   - dates are 11/5 thursday [place] 04/09/2019 Friday [place] (both late morning or early afternoon)  - date sare 04/19/2019 Monday. 04/20/2019 or 04/21/2019  [place] - early afternoon   Followup  -8 weeks from now to discuss results

## 2019-03-25 ENCOUNTER — Telehealth: Payer: Self-pay | Admitting: Internal Medicine

## 2019-03-25 NOTE — Telephone Encounter (Signed)
Pt returning call.  [phone].

## 2019-03-25 NOTE — Telephone Encounter (Signed)
lmtcb for pt.  

## 2019-03-25 NOTE — Telephone Encounter (Signed)
Called and spoke with Patient.  Patient stated she did not want to have her bronchoscopy that Dr. Chase Caller recommends until after Christmas.  Patient stated she felt very weak and is afraid she will not survive the procedure.  Patient stated she wanted 1 more good Christmas and then she can schedule her bronchoscopy with Dr. Chase Caller.  Message routed to Dr. Chase Caller

## 2019-03-26 NOTE — Telephone Encounter (Signed)
Sounds good

## 2019-03-26 NOTE — Telephone Encounter (Signed)
noted 

## 2019-04-01 ENCOUNTER — Telehealth: Payer: Self-pay | Admitting: Internal Medicine

## 2019-04-01 NOTE — Telephone Encounter (Signed)
lmtcb for pt.  Previous phone note states pt wants to put off the bronch until after Christmas.

## 2019-04-02 NOTE — Telephone Encounter (Signed)
LMTCB

## 2019-04-02 NOTE — Telephone Encounter (Signed)
Next best slot is December 4 Friday early afternoon and then December 15 Tuesday early afternoon.  Both at [facility]

## 2019-04-02 NOTE — Telephone Encounter (Signed)
ptg is returning call from yesterday - CB# [phone]

## 2019-04-02 NOTE — Telephone Encounter (Signed)
Spoke with pt, she wants to know if she can have her bronch done in the third week of November. MR do you have availability to do this bronch or have it scheduled? Please advise.

## 2019-04-05 NOTE — Telephone Encounter (Signed)
[name] from [facility] Res[facility]  Dept is calling to schedule a broch for the pt - CB# [phone]

## 2019-04-05 NOTE — Telephone Encounter (Signed)
Spoke with [name] patient scheduled for Bronch 05/18/19.  Patient scheduled for covid screen 05/15/19. Dr. Chase Caller please clarify if you want fluro for your bronch, right now it is not scheduled.  Then we will have to call [name] and let her know if MR wants fluro added

## 2019-04-05 NOTE — Telephone Encounter (Signed)
Spoke with pt. She is aware of the available dates that MR has. She would prefer to do it on 05/18/2019. I have left a message with Joellen Jersey in respiratory to get this scheduled.

## 2019-04-09 NOTE — Telephone Encounter (Signed)
05/18/2019 works for me in afternoon (early) at [employer]. Please confirm time. No fluoro. All we are doing is a BAL

## 2019-04-09 NOTE — Telephone Encounter (Signed)
Spoke with Katie at Respiratory who verified that pt is on schedule with no fluoro.    Spoke with pt, aware of date/time.  Sent a bronchoscopy form in mail to patient.  Nothing further needed at this time- will close encounter.

## 2019-04-09 NOTE — Telephone Encounter (Signed)
MR please advise. Thanks! 

## 2019-04-09 NOTE — Telephone Encounter (Signed)
lmtcb for Katie at respiratory to keep her updated with MR's recs for bronch. As stated below, bronch is already scheduled for 05/18/2019 at [facility] at 1:00.  Will await call back from [facility].

## 2019-04-12 ENCOUNTER — Other Ambulatory Visit: Payer: Self-pay | Admitting: Cardiology

## 2019-05-05 ENCOUNTER — Telehealth: Payer: Self-pay | Admitting: Cardiology

## 2019-05-05 ENCOUNTER — Telehealth: Payer: Self-pay | Admitting: Internal Medicine

## 2019-05-05 NOTE — Telephone Encounter (Signed)
Asking for sleeping pills

## 2019-05-05 NOTE — Telephone Encounter (Signed)
Virtual Visit Pre-Appointment Phone Call  "(Name), I am calling you today to discuss your upcoming appointment. We are currently trying to limit exposure to the virus that causes COVID-19 by seeing patients at home rather than in the office."  1. "What is the BEST phone number to call the day of the visit?" - include this in appointment notes  2. Do you have or have access to (through a family member/friend) a smartphone with video capability that we can use for your visit?" a. If yes - list this number in appt notes as cell (if different from BEST phone #) and list the appointment type as a VIDEO visit in appointment notes b. If no - list the appointment type as a PHONE visit in appointment notes  Confirm consent - "In the setting of the current Covid19 crisis, you are scheduled for a (phone or video) visit with your provider on (date) at (time).  Just as we do with many in-office visits, in order for you to participate in this visit, we must obtain consent.  If you'd like, I can send this to your mychart (if signed up) or email for you to review.  Otherwise, I can obtain your verbal consent now.  All virtual visits are billed to your insurance company just like a normal visit would be.  By agreeing to a virtual visit, we'd like you to understand that the technology does not allow for your provider to perform an examination, and thus may limit your provider's ability to fully assess your condition. If your provider identifies any concerns that need to be evaluated in person, we will make arrangements to do so.  Finally, though the technology is pretty good, we cannot assure that it will always work on either your or our end, and in the setting of a video visit, we may have to convert it to a phone-only visit.  In either situation, we cannot ensure that we have a secure connection.  Are you willing to proceed?" STAFF: Did the patient verbally acknowledge consent to telehealth visit? Document  YES/NO here: yes 3. Advise patient to be prepared - "Two hours prior to your appointment, go ahead and check your blood pressure, pulse, oxygen saturation, and your weight (if you have the equipment to check those) and write them all down. When your visit starts, your provider will ask you for this information. If you have an Apple Watch or Kardia device, please plan to have heart rate information ready on the day of your appointment. Please have a pen and paper handy nearby the day of the visit as well."  4. Give patient instructions for MyChart download to smartphone OR Doximity/Doxy.me as below if video visit (depending on what platform provider is using)  5. Inform patient they will receive a phone call 15 minutes prior to their appointment time (may be from unknown caller ID) so they should be prepared to answer    TELEPHONE CALL NOTE  [name] has been deemed a candidate for a follow-up tele-health visit to limit community exposure during the Covid-19 pandemic. I spoke with the patient via phone to ensure availability of phone/video source, confirm preferred email & phone number, and discuss instructions and expectations.  I reminded [name] to be prepared with any vital sign and/or heart rhythm information that could potentially be obtained via home monitoring, at the time of her visit. I reminded [name] to expect a phone call prior to her visit.  [name] 05/05/2019 12:47 PM   INSTRUCTIONS FOR DOWNLOADING THE MYCHART APP TO SMARTPHONE  - The patient must first make sure to have activated MyChart and know their login information - If Apple, go to [employer] and type in MyChart in the search bar and download the app. If Android, ask patient to go to [employer] and type in [place] in the search bar and download the app. The app is free but as with any other app downloads, their phone may require them to verify saved payment information or Apple/Android  password.  - The patient will need to then log into the app with their MyChart username and password, and select [place] as their healthcare provider to link the account. When it is time for your visit, go to the MyChart app, find appointments, and click Begin Video Visit. Be sure to Select Allow for your device to access the Microphone and Camera for your visit. You will then be connected, and your provider will be with you shortly.  **If they have any issues connecting, or need assistance please contact MyChart service desk [phone])**  **If using a computer, in order to ensure the best quality for their visit they will need to use either of the following Internet Browsers: [employer], or Google Chrome**  IF USING DOXIMITY or DOXY.ME - The patient will receive a link just prior to their visit by text.     FULL LENGTH CONSENT FOR TELE-HEALTH VISIT   I hereby voluntarily request, consent and authorize [facility] and its employed or contracted physicians, physician assistants, nurse practitioners or other licensed health care professionals (the Practitioner), to provide me with telemedicine health care services (the Services") as deemed necessary by the treating Practitioner. I acknowledge and consent to receive the Services by the Practitioner via telemedicine. I understand that the telemedicine visit will involve communicating with the Practitioner through live audiovisual communication technology and the disclosure of certain medical information by electronic transmission. I acknowledge that I have been given the opportunity to request an in-person assessment or other available alternative prior to the telemedicine visit and am voluntarily participating in the telemedicine visit.  I understand that I have the right to withhold or withdraw my consent to the use of telemedicine in the course of my care at any time, without affecting my right to future care or treatment,  and that the Practitioner or I may terminate the telemedicine visit at any time. I understand that I have the right to inspect all information obtained and/or recorded in the course of the telemedicine visit and may receive copies of available information for a reasonable fee.  I understand that some of the potential risks of receiving the Services via telemedicine include:   Delay or interruption in medical evaluation due to technological equipment failure or disruption;  Information transmitted may not be sufficient (e.g. poor resolution of images) to allow for appropriate medical decision making by the Practitioner; and/or   In rare instances, security protocols could fail, causing a breach of personal health information.  Furthermore, I acknowledge that it is my responsibility to provide information about my medical history, conditions and care that is complete and accurate to the best of my ability. I acknowledge that Practitioner's advice, recommendations, and/or decision may be based on factors not within their control, such as incomplete or inaccurate data provided by me or distortions of diagnostic images or specimens that may result from electronic transmissions. I understand that the  practice of medicine is not an Chief Strategy Officer and that Practitioner makes no warranties or guarantees regarding treatment outcomes. I acknowledge that I will receive a copy of this consent concurrently upon execution via email to the email address I last provided but may also request a printed copy by calling the office of [place].    I understand that my insurance will be billed for this visit.   I have read or had this consent read to me.  I understand the contents of this consent, which adequately explains the benefits and risks of the Services being provided via telemedicine.   I have been provided ample opportunity to ask questions regarding this consent and the Services and have had my questions  answered to my satisfaction.  I give my informed consent for the services to be provided through the use of telemedicine in my medical care  By participating in this telemedicine visit I agree to the above.

## 2019-05-05 NOTE — Telephone Encounter (Signed)
Called the number provided which is also the number that is listed on pt's demographics. When called the number, it went straight to VM and the VM was for a [employer]. There is no [employer] listed on DPR. Will try to call back later.

## 2019-05-05 NOTE — Telephone Encounter (Signed)
Pt aware that we wouldn't be able to prescribe her any sleeping medication as that is not heart related - pt also wanted it noted that she is having upcoming bronchoscopy and wanted to make Dr Harl Bowie aware of this - has appt 12/7 telehealth appt with Dr Harl Bowie - will forward [facility]

## 2019-05-06 NOTE — Telephone Encounter (Signed)
lmtcb for pt. VM states [employer].

## 2019-05-07 NOTE — Telephone Encounter (Signed)
Called the contact number and there is a Production designer, theatre/television/film for Elease Etienne who is not on the DPR in system. No message left. As the greeting is not in the name of the patient or either contact on her DPR. Nothing further needed at this time.

## 2019-05-07 NOTE — Telephone Encounter (Signed)
Patient is returning phone call.  Patient phone number is [phone]. Phone will be working this afternoon.

## 2019-05-10 ENCOUNTER — Encounter: Payer: Self-pay | Admitting: Family Medicine

## 2019-05-10 ENCOUNTER — Telehealth (INDEPENDENT_AMBULATORY_CARE_PROVIDER_SITE_OTHER): Payer: Medicare Other | Admitting: Family Medicine

## 2019-05-10 VITALS — BP 160/69 | HR 90 | Ht 61.0 in | Wt 118.0 lb

## 2019-05-10 DIAGNOSIS — I5042 Chronic combined systolic (congestive) and diastolic (congestive) heart failure: Secondary | ICD-10-CM

## 2019-05-10 DIAGNOSIS — Z79899 Other long term (current) drug therapy: Secondary | ICD-10-CM

## 2019-05-10 DIAGNOSIS — I110 Hypertensive heart disease with heart failure: Secondary | ICD-10-CM | POA: Diagnosis not present

## 2019-05-10 DIAGNOSIS — Z7982 Long term (current) use of aspirin: Secondary | ICD-10-CM

## 2019-05-10 DIAGNOSIS — E782 Mixed hyperlipidemia: Secondary | ICD-10-CM

## 2019-05-10 DIAGNOSIS — I25119 Atherosclerotic heart disease of native coronary artery with unspecified angina pectoris: Secondary | ICD-10-CM

## 2019-05-10 DIAGNOSIS — I10 Essential (primary) hypertension: Secondary | ICD-10-CM

## 2019-05-10 NOTE — Progress Notes (Signed)
Virtual Visit via Telephone Note   This visit type was conducted due to national recommendations for restrictions regarding the COVID-19 Pandemic (e.g. social distancing) in an effort to limit this patient's exposure and mitigate transmission in our community.  Due to her co-morbid illnesses, this patient is at least at moderate risk for complications without adequate follow up.  This format is felt to be most appropriate for this patient at this time.  The patient did not have access to video technology/had technical difficulties with video requiring transitioning to audio format only (telephone).  All issues noted in this document were discussed and addressed.  No physical exam could be performed with this format.  Please refer to the patient's chart for her  consent to telehealth for [name].   Date:  05/10/2019   ID:  RAVON MCILHENNY, DOB 03-06-1946, MRN [id number]  Patient Location: Home Provider Location: Office  PCP:  Donald Prose, MD  Cardiologist:  Carlyle Dolly, MD  Electrophysiologist:  None   Evaluation Performed:  Follow-Up Visit  Chief Complaint: Follow-up, coronary artery disease, hypertension, hyperlipidemia, combined systolic/diastolic heart failure.  History of Present Illness:    [name] is a 73 y.o. [name]  Last office visit with Dr Harl Bowie 11/10/2018 for history of coronary artery disease status post cardiac catheterization in May 2019 with overall mild to moderate nonobstructive disease stable from 2012 catheterization.  Other history includes hypertension, hyperlipidemia, MS, COPD, chronic left bundle branch block, chronic pleuritic chest pain.  At last visit was complaining of fatigue and it was unclear as to whether she associated fatigue with Toprol use.  She had apparently stopped the medication.  She did agree to restart at a lower dose.  States she is now taking Toprol XL 25 mg 1/2 pill in the a.m. and 1/2 pill in p.m.  Patient states she feels  tired and fatigued most of the time.  States she finds it hard to get out of bed and do things at home including housework.  She has an upcoming diagnostic/therapeutic bronchoscopy with Dr. Chase Caller secondary to recent abnormal chest CT.  Results are below.  The patient does not have symptoms concerning for COVID-19 infection (fever, chills, cough, or new shortness of breath).    Past Medical History:  Diagnosis Date  . Anxiety   . Aortic atherosclerosis ([place]) 09/29/2017   High Res CT in 9/18: aortic atherosclerosis; coronary artery calcification  . Cervical disc disease    BULGING  . Chronic combined systolic and diastolic CHF (congestive heart failure) ([place]) 09/29/2017   Echo 7/18: Moderate concentric LVH, grade 1 diastolic dysfunction, abnormal septal wall motion due to LBBB, moderate diffuse HK, EF 35-40, atrial septal aneurysm with possible PFO, mild TR // Echo 5/19: EF 50-55, normal wall motion, grade 1 diastolic dysfunction, trivial TR  . Complication of anesthesia    SLOW TO AWAKEN AFTER LAST COLONSCOPY  . Coronary artery disease 05/09/2015   LHC 10/12: EF 55, OM1 80-90 - difficult to engage >> treated medically // Nuc 8/18: EF 40, no ischemia  . Depression   . DJD (degenerative joint disease)    OA  . Dysrhythmia    Not clear where this diagnosis came from  . Emphysema of lung ([place])    Dr. Chase Caller  . Fibromyalgia   . H/O: liver disease 60 YRS AGO   tranamanitis due to previous interferon - LFTs improved off of  . History of MI (myocardial infarction) FEW YRS AGO  . History  of TIA (transient ischemic attack)   . Hx of colonic polyps   . Hyperlipidemia    intol to statin - myalgias // ESPERION trial - patient stopped  . Multiple sclerosis ([place])   . Narcotic dependence (HCC)    hx of benzodiazepine and narcotic  . Osteoporosis   . Positive H. pylori test   . Urinary incontinence    Past Surgical History:  Procedure Laterality Date  . BACK SURGERY  11-2009   LOWER  BACK  . BOTOX INJECTION N/A 03/21/2016   Procedure: BOTOX INJECTION;  Surgeon: Wilford Corner, MD;  Location: [facility];  Service: Endoscopy;  Laterality: N/A;  . CARDIAC CATHETERIZATION     UNSUCCESSFUL STENT PLACEMENT  . COLONSCOPY    . ESOPHAGEAL MANOMETRY N/A 10/23/2015   Procedure: ESOPHAGEAL MANOMETRY (EM);  Surgeon: Wilford Corner, MD;  Location: [facility];  Service: Endoscopy;  Laterality: N/A;  . ESOPHAGOGASTRODUODENOSCOPY (EGD) WITH PROPOFOL N/A 03/21/2016   Procedure: ESOPHAGOGASTRODUODENOSCOPY (EGD) WITH PROPOFOL;  Surgeon: Wilford Corner, MD;  Location: [facility];  Service: Endoscopy;  Laterality: N/A;  . RIGHT/LEFT HEART CATH AND CORONARY ANGIOGRAPHY N/A 10/02/2017   Procedure: RIGHT/LEFT HEART CATH AND CORONARY ANGIOGRAPHY;  Surgeon: Nelva Bush, MD;  Location: [facility];  Service: Cardiovascular;  Laterality: N/A;  . TUBAL LIGATION       Current Meds  Medication Sig  . Ascorbic Acid (VITAMIN C) 1000 MG tablet Take 1,000 mg by mouth daily.  [name] aspirin EC 81 MG tablet Take 324 mg by mouth daily.   . benzonatate (TESSALON) 200 MG capsule Take 1 capsule (200 mg total) by mouth 3 (three) times daily as needed for cough.  . Cholecalciferol (VITAMIN D3 PO) Take 1 capsule by mouth daily.   . clonazePAM (KLONOPIN) 1 MG tablet Take 1 mg by mouth 3 (three) times daily.   [name] COENZYME Q10 PO Take 1 capsule by mouth 2 (two) times daily.   . diphenhydrAMINE (BENADRYL) 50 MG capsule Take 50 mg by mouth as needed.   . doxepin (SINEQUAN) 25 MG capsule Take 1 capsule (25 mg total) by mouth at bedtime.  . gabapentin (NEURONTIN) 100 MG capsule Take 100 mg by mouth daily as needed. Take 100 mg by mouth daily and take 300 mg by mouth at bedtime   . gabapentin (NEURONTIN) 300 MG capsule Take 1 capsule (300 mg total) by mouth daily.  [name] GLATOPA 20 MG/ML SOSY injection USE 1 INJECTION SUBCUTANEOUSLY ONCE A DAY  . guaiFENesin (MUCINEX) 600 MG 12 hr tablet Take 600 mg by mouth  2 (two) times daily as needed for cough or to loosen phlegm.   . Homeopathic Products (SIMILASAN DRY EYE RELIEF OP) Place 1 drop into both eyes 2 (two) times daily.  . INCRUSE ELLIPTA 62.5 MCG/INH AEPB INHALE 1 PUFF INTO THE LUNGS ONCE DAILY  . ipratropium (ATROVENT) 0.02 % nebulizer solution Take 2.5 mLs (0.5 mg total) by nebulization 4 (four) times daily.  . isosorbide mononitrate (IMDUR) 60 MG 24 hr tablet Take 1.5 tablets (90 mg total) by mouth daily.  [name] LINZESS 290 MCG CAPS capsule Take 290 mcg by mouth daily before breakfast.   . losartan (COZAAR) 25 MG tablet TAKE 1 TABLET BY MOUTH ONCE A DAY  . lovastatin (MEVACOR) 40 MG tablet TAKE 1 TABLET BY MOUTH ONCE DAILY EVERY EVENING  . metoprolol succinate (TOPROL-XL) 25 MG 24 hr tablet Take 1 tablet (25 mg total) by mouth daily.  . modafinil (PROVIGIL) 200 MG tablet TAKE 1  TABLET BY MOUTH TWICE A DAY  . Multiple Vitamin (MULTIVITAMIN WITH MINERALS) TABS tablet Take 1 tablet by mouth daily.  . nitroGLYCERIN (NITROSTAT) 0.4 MG SL tablet DISSOLVE 1 TABLET UNDER TONGUE AS NEEDEDFOR CHEST PAIN. MAY REPEAT 5 MINUTES APART 3 TIMES IF NEEDED  . NUCYNTA ER 50 MG TB12 Take 50 mg by mouth every 12 (twelve) hours.   . Omega-3 Fatty Acids (OMEGA 3 PO) Take 1 capsule by mouth daily.  [name] omeprazole (PRILOSEC) 20 MG capsule TAKE 1 CAPSULE BY MOUTH ONCE DAILY  . oxyCODONE-acetaminophen (PERCOCET) 5-325 MG per tablet Take 1 tablet by mouth every 4 (four) hours as needed for severe pain.   . polyethylene glycol powder (GLYCOLAX/MIRALAX) powder Take 17 g by mouth daily as needed for moderate constipation.   [name] PRISTIQ 50 MG 24 hr tablet Take 50 mg by mouth at bedtime.   [name] PROAIR HFA 108 (90 Base) MCG/ACT inhaler INHALE 2 PUFFS INTO THE LUNGS EVERY 6 HOURS AS NEEDED FOR WHEEZING ORSHORTNESS OF BREATH  . umeclidinium bromide (INCRUSE ELLIPTA) 62.5 MCG/INH AEPB Inhale 1 puff into the lungs daily.  . vitamin B-12 (CYANOCOBALAMIN) 1000 MCG tablet Take 1,500 mcg by mouth  daily.     Allergies:   Crestor [rosuvastatin], Sulfonamide derivatives, and Contrast media [iodinated diagnostic agents]   Social History   Tobacco Use  . Smoking status: Former Smoker    Packs/day: 1.00    Years: 48.00    Pack years: 48.00    Types: Cigarettes    Quit date: 11/16/2009    Years since quitting: 9.4  . Smokeless tobacco: Never Used  Substance Use Topics  . Alcohol use: No  . Drug use: No     Family Hx: The patient's family history includes Cancer in her brother; Heart attack (age of onset: 51) in her father; Heart attack (age of onset: 64) in her mother.  ROS:   Please see the history of present illness.    All other systems reviewed and are negative.    Prior CV studies:   The following studies were reviewed today:  Cardiac catheterization Oct 02, 2017 Conclusions: 1. Mild to moderate, non-obstructive coronary artery disease involving the proximal/mid LAD and OM1.  Overall appearance is similar to prior catheterizations in 2012. 2. Low normal to mildly reduced left ventricular contraction (LVEF ~50%). 3. Upper normal left and right heart filling pressures. 4. Normal pulmonary artery pressure. 5. Mildly elevated transpulmonary valve gradient. 6. Normal Fick cardiac output/index. Recommendations: 1. Continue medical therapy.  As chest pain has improved with addition of isosorbide mononitrate, further uptitration should be considered in the future for relief of symptoms. 2. Risk factor modification and medical therapy to prevent progression of coronary artery disease.   Echocardiogram Oct 13, 2017 Study Conclusions  - Left ventricle: The cavity size was normal. Systolic function was   normal. The estimated ejection fraction was in the range of 50%   to 55%. Wall motion was normal; there were no regional wall   motion abnormalities. There was an increased relative   contribution of atrial contraction to ventricular filling.   Doppler parameters are  consistent with abnormal left ventricular   relaxation (grade 1 diastolic dysfunction). Doppler parameters   are consistent with high ventricular filling pressure. - Ventricular septum: Septal motion showed paradox. - Tricuspid valve: There was trivial regurgitation.  Recent CT of the chest dated January 22, 2019 IMPRESSION: 1. There is extensive, clustered centrilobular and tree-in-bud pulmonary nodularity bilaterally with mild associated  tubular bronchiectasis and occasional plugging. There is diffusely new and increased nodularity and new and increased small consolidations, particularly in the dependent right lower lobe (series 5, image 203). 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 2.  Aortic atherosclerosis and coronary artery disease.  Recent pulmonary function test December 01, 2017 The FVC, FEV1, FEV1/FVC ratio and FEF25-75% are within normal limits, but there is curvature to the flow volume loop suggesting minimal small airway disease. The diffusing capacity is normal. However, the diffusing capacity was not corrected for the patient's hemoglobin. Conclusions: Pulmonary Function Diagnosis: Minimal Obstructive Airways Disease -Peripheral Airway  Labs/Other Tests and Data Reviewed:    EKG:  An ECG dated July 14, 2018 was personally reviewed today and demonstrated:  Sinus rhythm left bundle branch block rate of 96  Recent Labs: 06/02/2018: ALT 15; BUN 11; Creatinine, Ser 0.83; Potassium 3.9; Pro B Natriuretic peptide (BNP) 36.0; Sodium 130   Recent Lipid Panel Lab Results  Component Value Date/Time   CHOL 201 (H) 05/09/2015 05:55 AM   TRIG 104 05/09/2015 05:55 AM   HDL 53 05/09/2015 05:55 AM   CHOLHDL 3.8 05/09/2015 05:55 AM   LDLCALC 127 (H) 05/09/2015 05:55 AM    Wt Readings from Last 3 Encounters:  05/10/19 118 lb (53.5 kg)  03/23/19 120 lb 3.2 oz (54.5 kg)  03/05/19 120 lb (54.4 kg)     Objective:    Vital  Signs:  BP (!) 160/69   Pulse 90   Ht 5\' 1"  (1.549 m)   Wt 118 lb (53.5 kg)   BMI 22.30 kg/m    VITAL SIGNS:  reviewed   Normal speech pattern.  No evidence of cough, wheezing, or dyspnea.  ASSESSMENT & PLAN:    1. Coronary artery disease involving native coronary artery of native heart with angina pectoris [facility]) Patient denies any progressive anginal or exertional symptoms.  She continues to take isosorbide mononitrate 90 mg daily.  Continue aspirin and lovastatin.  2. Chronic combined systolic and diastolic CHF (congestive heart failure) ([place]) Recent echo in May showed LVEF of 50 to 55%.  Doppler parameters consistent with abnormal relaxation (grade 1 diastolic dysfunction).  Doppler parameters consistent with high ventricular filling pressures.  3. Essential hypertension Blood pressure elevated today 160/69 per patient's measurement.  Patient states she has been taking her Toprol-XL 1/2 pill p.o. twice a day.  Advised patient this is not a twice a day medication.  Encouraged her to take the medication as directed to help with hypertension.  Patient verbalizes understanding.  Patient states she is compliant with all other antihypertensive medication including losartan 25 mg daily.  4. Mixed hyperlipidemia On lovastatin 40 mg daily.  Needs an updated lipid profile at next visit.  Patient states she has not seen her PCP recently.  Patient recently had multiple labs drawn by Dr. Chase Caller to check for autoimmune, connective tissue, granulomatous / vasculitis disease disorders.  All lab work was negative.  She is scheduled for diagnostic/therapeutic bronchoscopy on December 15 with Dr. Chase Caller.    COVID-19 Education: The signs and symptoms of COVID-19 were discussed with the patient and how to seek care for testing (follow up with PCP or arrange E-visit). The importance of social distancing was discussed today.  Time:   Today, I have spent 15 minutes with the patient with telehealth  technology discussing the above problems.     Medication Adjustments/Labs and Tests Ordered: Current medicines are reviewed at length with the patient today.  Concerns regarding medicines are outlined above.   Tests Ordered: No orders of the defined types were placed in this encounter.   Medication Changes: No orders of the defined types were placed in this encounter.   Follow Up:  Either In Person or Virtual in 6 month(s)  Signed, Verta Ellen, NP  05/10/2019 3:25 PM    [place]

## 2019-05-10 NOTE — Patient Instructions (Addendum)
Medication Instructions:   Your physician recommends that you continue on your current medications as directed. Please refer to the Current Medication list given to you today.  Labwork:  NONE  Testing/Procedures:  NONE  Follow-Up:  Your physician recommends that you schedule a follow-up appointment in: 6 months (office or virtual).  Any Other Special Instructions Will Be Listed Below (If Applicable).  If you need a refill on your cardiac medications before your next appointment, please call your pharma

## 2019-05-14 ENCOUNTER — Other Ambulatory Visit (HOSPITAL_COMMUNITY)
Admission: RE | Admit: 2019-05-14 | Discharge: 2019-05-14 | Disposition: A | Discharge status: Home / Self Care | Payer: Medicare Other | Source: Ambulatory Visit | Attending: Internal Medicine | Admitting: Internal Medicine

## 2019-05-14 DIAGNOSIS — Z20828 Contact with and (suspected) exposure to other viral communicable diseases: Secondary | ICD-10-CM | POA: Diagnosis not present

## 2019-05-14 DIAGNOSIS — Z01812 Encounter for preprocedural laboratory examination: Secondary | ICD-10-CM | POA: Insufficient documentation

## 2019-05-15 ENCOUNTER — Other Ambulatory Visit (HOSPITAL_COMMUNITY): Payer: Medicare Other

## 2019-05-15 LAB — NOVEL CORONAVIRUS, NAA (HOSP ORDER, SEND-OUT TO REF LAB; TAT 18-24 HRS): SARS-CoV-2, NAA: NOT DETECTED

## 2019-05-18 ENCOUNTER — Ambulatory Visit (HOSPITAL_COMMUNITY)
Admission: RE | Admit: 2019-05-18 | Discharge: 2019-05-18 | Disposition: A | Discharge status: Home / Self Care | Payer: Medicare Other | Attending: Internal Medicine | Admitting: Internal Medicine

## 2019-05-18 ENCOUNTER — Encounter (HOSPITAL_COMMUNITY): Payer: Self-pay | Admitting: Internal Medicine

## 2019-05-18 ENCOUNTER — Telehealth: Payer: Self-pay | Admitting: Emergency Medicine

## 2019-05-18 ENCOUNTER — Ambulatory Visit (HOSPITAL_COMMUNITY)
Admission: RE | Admit: 2019-05-18 | Discharge: 2019-05-18 | Disposition: A | Discharge status: Home / Self Care | Payer: Medicare Other | Source: Ambulatory Visit | Attending: Internal Medicine | Admitting: Internal Medicine

## 2019-05-18 ENCOUNTER — Encounter (HOSPITAL_COMMUNITY)
Admission: RE | Disposition: A | Discharge status: Home / Self Care | Payer: Self-pay | Source: Home / Self Care | Attending: Internal Medicine

## 2019-05-18 DIAGNOSIS — R918 Other nonspecific abnormal finding of lung field: Secondary | ICD-10-CM | POA: Diagnosis not present

## 2019-05-18 DIAGNOSIS — R0600 Dyspnea, unspecified: Secondary | ICD-10-CM

## 2019-05-18 DIAGNOSIS — R05 Cough: Secondary | ICD-10-CM | POA: Diagnosis present

## 2019-05-18 DIAGNOSIS — R0609 Other forms of dyspnea: Secondary | ICD-10-CM | POA: Diagnosis not present

## 2019-05-18 DIAGNOSIS — R053 Chronic cough: Secondary | ICD-10-CM

## 2019-05-18 HISTORY — PX: VIDEO BRONCHOSCOPY: SHX5072

## 2019-05-18 LAB — BODY FLUID CELL COUNT WITH DIFFERENTIAL
Eos, Fluid: 1 %
Lymphs, Fluid: 4 %
Monocyte-Macrophage-Serous Fluid: 9 % — ABNORMAL LOW (ref 50–90)
Neutrophil Count, Fluid: 86 % — ABNORMAL HIGH (ref 0–25)
Total Nucleated Cell Count, Fluid: 705 cu mm (ref 0–1000)

## 2019-05-18 SURGERY — VIDEO BRONCHOSCOPY WITHOUT FLUORO
Anesthesia: Moderate Sedation | Laterality: Bilateral

## 2019-05-18 MED ORDER — SODIUM CHLORIDE 0.9 % IV SOLN: INTRAVENOUS | Status: DC

## 2019-05-18 MED ORDER — MIDAZOLAM HCL (PF) 10 MG/2ML IJ SOLN
INTRAMUSCULAR | Status: DC | PRN
  Administered 2019-05-18: 2 mg via INTRAVENOUS
  Administered 2019-05-18: 1 mg via INTRAVENOUS
  Administered 2019-05-18: 2 mg via INTRAVENOUS

## 2019-05-18 MED ORDER — MIDAZOLAM HCL (PF) 5 MG/ML IJ SOLN
INTRAMUSCULAR | Status: AC
  Filled 2019-05-18: qty 2

## 2019-05-18 MED ORDER — LIDOCAINE HCL URETHRAL/MUCOSAL 2 % EX GEL
CUTANEOUS | Status: DC | PRN
  Administered 2019-05-18: 1

## 2019-05-18 MED ORDER — BUTAMBEN-TETRACAINE-BENZOCAINE 2-2-14 % EX AERO: 1.0000 | INHALATION_SPRAY | Freq: Once | CUTANEOUS | Status: DC

## 2019-05-18 MED ORDER — LIDOCAINE HCL URETHRAL/MUCOSAL 2 % EX GEL: 1.0000 "application " | Freq: Once | CUTANEOUS | Status: DC

## 2019-05-18 MED ORDER — FENTANYL CITRATE (PF) 100 MCG/2ML IJ SOLN
INTRAMUSCULAR | Status: DC | PRN
  Administered 2019-05-18 (×2): 50 ug via INTRAVENOUS

## 2019-05-18 MED ORDER — FENTANYL CITRATE (PF) 100 MCG/2ML IJ SOLN
INTRAMUSCULAR | Status: AC
  Filled 2019-05-18: qty 4

## 2019-05-18 MED ORDER — LIDOCAINE HCL 1 % IJ SOLN
INTRAMUSCULAR | Status: DC | PRN
  Administered 2019-05-18: 6 mL via RESPIRATORY_TRACT

## 2019-05-18 MED ORDER — PHENYLEPHRINE HCL 0.25 % NA SOLN
NASAL | Status: DC | PRN
  Administered 2019-05-18: 2 via NASAL

## 2019-05-18 MED ORDER — PHENYLEPHRINE HCL 0.25 % NA SOLN: 1.0000 | Freq: Four times a day (QID) | NASAL | Status: DC | PRN

## 2019-05-18 NOTE — Progress Notes (Signed)
Video Bronchoscopy done Interventional Bronchoscopy done Procedure tolerated well

## 2019-05-18 NOTE — H&P (Signed)
Pre-bronch procedure note 05/18/2019    S:  - Patient last seen 03/23/2019. Since then no new issues. Confirmed NPO and no new issues. Meds taken this morning   O:  - vital stable - exam  - no resp distress, clear to auscultation  - normal heart sounds - abd soft  - extremeties - no edema   CT chest - multiple infiltrates. RLL nodules  A/P Pulmonary infiltrates - cough, dyspnea  Plan  - Risks of pneumothorax, hemothorax, sedation/anesthesia complications such as cardiac or respiratory arrest or hypotension, stroke and bleeding all explained. Benefits of diagnosis but limitations of non-diagnosis also explained. Patient verbalized understanding and wished to proceed.      SIGNATURE    Dr. Brand Males, M.D., F.C.C.P,  Pulmonary and Critical Care Medicine Staff Physician, [place] Director - Interstitial Lung Disease  Program  Pulmonary [place] [facility] [place], [place], [address]  Pager: [phone], If no answer or between  15:00h - 7:00h: call [phone] Telephone: [phone]  1:08 PM 05/18/2019

## 2019-05-18 NOTE — Telephone Encounter (Signed)
pls give appt to discuss bronch results in 4 weeks. I Tried calling main number but no one was picking up

## 2019-05-18 NOTE — Telephone Encounter (Signed)
Attempted to call pt but unable to reach. Left pt a message to return call.  When pt returns call, pt needs to be scheduled 4 week follow up with MR in [facility] clinic to discuss bronch results.

## 2019-05-18 NOTE — Discharge Instructions (Signed)
Flexible Bronchoscopy, Care After This sheet gives you information about how to care for yourself after your test. Your doctor may also give you more specific instructions. If you have problems or questions, contact your doctor. Follow these instructions at home: Eating and drinking  Do not eat or drink anything (not even water) for 2 hours after your test, or until your numbing medicine (local anesthetic) wears off.  When your numbness is gone and your cough and gag reflexes have come back, you may: ? Eat only soft foods. ? Slowly drink liquids.  The day after the test, go back to your normal diet. Driving  Do not drive for 24 hours if you were given a medicine to help you relax (sedative).  Do not drive or use heavy machinery while taking prescription pain medicine. General instructions   Take over-the-counter and prescription medicines only as told by your doctor.  Return to your normal activities as told. Ask what activities are safe for you.  Do not use any products that have nicotine or tobacco in them. This includes cigarettes and e-cigarettes. If you need help quitting, ask your doctor.  Keep all follow-up visits as told by your doctor. This is important. It is very important if you had a tissue sample (biopsy) taken. Get help right away if:  You have shortness of breath that gets worse.  You get light-headed.  You feel like you are going to pass out (faint).  You have chest pain.  You cough up: ? More than a little blood. ? More blood than before. Summary  Do not eat or drink anything (not even water) for 2 hours after your test, or until your numbing medicine wears off.  Do not use cigarettes. Do not use e-cigarettes.  Get help right away if you have chest pain. This information is not intended to replace advice given to you by your health care provider. Make sure you discuss any questions you have with your health care provider. Document Released: 03/17/2009  Document Revised: 05/02/2017 Document Reviewed: 06/07/2016 Elsevier Patient Education  2020 [employer].  Nothing to eat or drink until  3:30   pm today 05/18/2019 Any questions or concerns please call the office at [phone]  Please call [phone] to make appointment in 4 weeks

## 2019-05-18 NOTE — Op Note (Signed)
Name:  KEELYNN FURGERSON MRN:  [id number] DOB:  January 16, 1946  PROCEDURE NOTE  Procedure(s): Flexible bronchoscopy [phone]) Bronchial alveolar lavage [phone]) of the Right lower lobe   Indications:  Cough, pulmonary infiltrates, dyspnea. Suspect MAI  Consent:  Procedure, benefits, risks and alternatives discussed.  Questions answered.  Consent obtained.  Anesthesia:  Moderate Sedation  Location: [facility]  Procedure summary:  Appropriate equipment was assembled.  The patient was brought to the procedure suite room and identified as SYNA GAD with 1945/06/13  Safety timeout was performed. The patient was placed supine on the  table, airway and moderate sedation administered by this operator  After the appropriate level of moderation was assured, flexible video bronchoscope was lubricated and inserted through the endotracheal tube.  1% Lidocaine were administered through the bronchoscope to augment moderate sedation  Airway examination was yes performed bilaterally to subsegmental level.  Minimal clear secretions were noted, mucosa appeared normal and no endobronchial lesions were identified.  Bronchial alveolar lavage of the Right lower lobe was performed with 120 mL of normal saline  In 4 aliquots of 20cc (suctioned out)( and then 40cc x 2 followed by 20cc x1 for total volume of 120 mL. Total return of 40 mL of grey mucus fluid, after which the bronchoscope was withdrawn.   After hemostasis was assure, the bronchoscope was withdrawn.  The patient was recovered and then  transferred to recovery area  Post-procedure chest x-ray was not needed and so ordered.  Specimens sent: Bronchial alveolar lavage specimen of the Right lower lobe for cell count , microbiology and cytology.  Complications:  No immediate complications were noted.  Hemodynamic parameters and oxygenation remained stable throughout the procedure.  Estimated blood loss:  none  IMPRESSION 1. Normal airway 2. Right  lower lavage   Followup Future Appointments  Date Time Provider [place]  09/06/2019 10:50 AM Pieter Partridge, DO [facility] None  11/09/2019  1:40 PM Branch, Alphonse Guild, MD [facility]      Dr. Brand Males, M.D., [facility].C.P Pulmonary and Critical Care Medicine Staff Physician [place] [facility] Pager: [phone], If no answer or between  15:00h - 7:00h: call [phone]  05/18/2019 1:26 PM

## 2019-05-19 LAB — ACID FAST SMEAR (AFB, MYCOBACTERIA): Acid Fast Smear: NEGATIVE

## 2019-05-19 LAB — CYTOLOGY - NON PAP

## 2019-05-19 NOTE — Telephone Encounter (Signed)
Pt has been scheduled for an appt with MR. Nothing further needed.

## 2019-05-20 LAB — CULTURE, BAL-QUANTITATIVE W GRAM STAIN
Culture: 30000 — AB
Special Requests: NORMAL

## 2019-05-20 LAB — PNEUMOCYSTIS JIROVECI SMEAR BY DFA: Pneumocystis jiroveci Ag: NEGATIVE

## 2019-05-24 LAB — MTB RIF NAA NON-SPUTUM, W/O CULTURE

## 2019-05-26 ENCOUNTER — Telehealth: Payer: Self-pay | Admitting: Neurology

## 2019-05-26 ENCOUNTER — Telehealth: Payer: Self-pay

## 2019-05-26 NOTE — Telephone Encounter (Signed)
Patient called requesting to speak with a nurse about her medications. I could not understand the patient very well. Patient does not have a telephone number she can be reached at today. She will call back later and ask for a nurse.  [name]

## 2019-05-26 NOTE — Telephone Encounter (Signed)
Patient is going to call office back, she has no phone of use to return call at this time

## 2019-05-26 NOTE — Telephone Encounter (Signed)
I don't think so.  She is currently taking Provigil and she previously was on Adderall and it was ineffective.

## 2019-05-26 NOTE — Telephone Encounter (Signed)
Wants to start adderall can you Rx per patient? Feels like she needs it? Pt to call back later.

## 2019-05-31 LAB — MISC LABCORP TEST (SEND OUT): LabCorp test name: 182212

## 2019-06-10 ENCOUNTER — Other Ambulatory Visit: Payer: Self-pay | Admitting: Cardiology

## 2019-06-10 ENCOUNTER — Other Ambulatory Visit: Payer: Self-pay | Admitting: Internal Medicine

## 2019-06-17 ENCOUNTER — Other Ambulatory Visit: Payer: Self-pay | Admitting: Family Medicine

## 2019-06-17 DIAGNOSIS — E2839 Other primary ovarian failure: Secondary | ICD-10-CM

## 2019-06-17 DIAGNOSIS — Z1231 Encounter for screening mammogram for malignant neoplasm of breast: Secondary | ICD-10-CM

## 2019-06-24 ENCOUNTER — Ambulatory Visit: Payer: Medicare Other | Admitting: Internal Medicine

## 2019-06-24 ENCOUNTER — Encounter: Payer: Self-pay | Admitting: Internal Medicine

## 2019-06-24 ENCOUNTER — Other Ambulatory Visit: Payer: Self-pay

## 2019-06-24 ENCOUNTER — Other Ambulatory Visit: Payer: Self-pay | Admitting: General Surgery

## 2019-06-24 VITALS — BP 116/68 | HR 76 | Temp 97.3°F | Ht 61.0 in | Wt 118.6 lb

## 2019-06-24 DIAGNOSIS — A4901 Methicillin susceptible Staphylococcus aureus infection, unspecified site: Secondary | ICD-10-CM

## 2019-06-24 DIAGNOSIS — J438 Other emphysema: Secondary | ICD-10-CM | POA: Diagnosis not present

## 2019-06-24 DIAGNOSIS — R918 Other nonspecific abnormal finding of lung field: Secondary | ICD-10-CM | POA: Diagnosis not present

## 2019-06-24 MED ORDER — DOXYCYCLINE HYCLATE 100 MG PO TABS: 100.0000 mg | ORAL_TABLET | Freq: Two times a day (BID) | ORAL | 0 refills | Status: DC

## 2019-06-24 NOTE — Patient Instructions (Addendum)
Other emphysema ([place])  - Continue incruse daily scheduled and do nebulizer as needed  - compliance important    Pulmonary nodules - small right upper lobe and right lower lobe from MSSA   -increased feb 2020 -> Aug 2020 but glad you are feeling ok - culture 05/18/2019 grew low colony growth of MSSA - ? Yeast growing  Plan   - Take doxycycline 100mg  po twice daily x 10 days; take after meals and avoid sunlight - await AFB and fungal cultures   Followup 3 months clinical followup; CAT score at followup - can decide on timing of repeat CT chest at followup

## 2019-06-24 NOTE — Progress Notes (Signed)
_0  ID: [name], [name]    DOB: 01-30-1946, 74 y.o.   MRN: [id number]  Chief Complaint  Patient presents with  . Follow-up    Emphysema     Referring provider: Donald Prose, MD  HPI: 74 year old [name] former smoker followed for emphysema, and lung nodule  TEST  High-resolution CT chest September 2018 showed 16 mm left upper lobe nodule unchanged dating back to September 2014 consistent with benign etiology, mild emphysema   12/01/2017 Follow up : Emphysema  Patient returns for a 23-monthfollow-up.  Patient was seen last visit.  She was started on ipratropium nebulizers twice daily.  Says that this has really helped her breathing.  She feels less short of breath.  She has been able to be more active. Feels that this is the best she is done in a long time.  She is actually able to get out and do things.  She is very happy with this.  She is now able to walk her dog. PFT today showed normal lung function with FEV1 at 109%, ratio 82, FVC 100%, DLCO 87%.  OV 05/14/2018  Subjective:  Patient ID: [name] [name] , DOB: 8March 02, 1947, age 74y.o. , MRN: [id number], [address] [place] [facility] [id number]  05/14/2018 -   Chief Complaint  Patient presents with  . Follow-up    pt reports of sob with exertion & chest discomfort.    + HPI Audrey PENNINO75y.o. -presents for follow-up of emphysema associated with bilateral lower lobe crackles.  No evidence of ILD on September 2018 CT chest.  Overall she is stable.  COPD CAT score is only 10.  She is on some kind of a bronchodilator regimen and she is not fully compliant with it.  She says she will step up her compliance.  She says she is up-to-date with the flu shot but we do not have a date on this.  She says she is depressed because it is Christmas time.  She also thinks her cardiologist gave her a bad prognosis.  She does not know what the diseases.      Patient ID: [name] [name]     DOB: [birth year] 74y.o.   MRN: [id number] Chief Complaint  Patient presents with  . Chest Pain    Left sided ribs have been hurting for the past few days. Thinks she may have cracked a rib with a recent cold.     Referring provider: SDonald Prose MD  HPI: 74year old [name], former smoker quit 2011(48 pack year hx). PMH COPD, fibromyalgia, MS, heart failure. Patient of Dr. RChase [name] last see by [name] on 06/02/18. CXR showed no evidence of PNA or CHF. Some chronic bronchitic changes related to smoking. HRCT in 2018 showed no significant findings except for emphysema.   07/14/2018 Patient presents today for acute visit with continued complaints of left pleuritic pain. Reports that she had a bad cough which has slowly improved. Left rib cage is sore to touch and skin burns. No rash. Takes percocet for chronic pain/fibromyalgia, unsure if it helps. LFTs were normal. Ordered for CT abd but patient cancelled d/t cough. She has not been taking incruse inhaler or Atrovent nebulizer as often as it is prescribed. EKG today showed SR with left bundle branch block, unchanged from previous in May 2019. Denies shortness of breath.    OV 08/04/2018  Subjective:  Patient ID: [name]  [name] , DOB: 1945-10-25 , age 56 y.o. , MRN: [id number] , ADDRESS: [address] [place] [facility] [address]   08/04/2018 -   Chief Complaint  Patient presents with  . Follow-up    Pt states she is still taking abx after recent OV with Audrey Barrow, [name]. Pt denies any current complaints of SOB but states she has had an occ cough. Pt states she still has been having problems with pain in her chest.     HPI [name] 74 y.o. -presents for follow-up.  She has COPD and fibromyalgia.  She is a Designer, jewellery recently and had multitude of complaints including pleuritic chest pain on the left side.  This resulted in a CT scan of the chest and also autoimmune work-up which were negative.  The CT scan shows right upper  lobe scattered pulmonary nodules are extremely small.  This on the contralateral side to the pain.  Today she tells me that the left-sided pleuritic pain is resolved.  She says since that she is having mood issues.  She also is constantly shaking her head which she says is a chronic intermittent issue.  Last visit nurse practitioner gave her Incruse inhaler but she has no idea that this was even dispensed to her prescribed for her.  She thinks our office felt short in sending the prescription to the pharmacy.  Instead she is using albuterol as needed.  COPD CAT score is minimal and symptom score is documented below.       IMPRESSION: Interval development of cluster of small nodules seen in the lateral portion of right upper lobe concerning for atypical infection such as mycobacterium, or chronic sequela of previous such infection. Clinical correlation is recommended.  Stable pleural-based irregular density noted in left lung apex most consistent with benign scarring.  Minimal bilateral posterior basilar subsegmental atelectasis or scarring is noted.  Mild coronary artery calcifications are noted.  Aortic Atherosclerosis (ICD10-I70.0).   Electronically Signed   By: [name], M.D.   On: 07/15/2018 16:05   Results for Audrey, [name] (MRN [id number]) as of 08/04/2018 12:46  Ref. Range 07/17/2018 12:08 07/17/2018 12:08  Anti Nuclear Antibody(ANA) Latest Ref Range: Negative  NEGATIVE Negative  ANA Titer 1 Unknown  Negative  Cyclic Citrullin Peptide Ab Latest Units: UNITS <16   dsDNA Ab Latest Ref Range: 0 - 9 IU/mL  <1  ENA RNP Ab Latest Ref Range: 0.0 - 0.9 AI  <0.2  ENA SSA (RO) Ab Latest Ref Range: 0.0 - 0.9 AI  <0.2  ENA SSB (LA) Ab Latest Ref Range: 0.0 - 0.9 AI  <0.2  Myeloperoxidase Abs Latest Units: AI <1.0   Serine Protease 3 Latest Units: AI <1.0   RA Latex Turbid. Latest Ref Range: <14 IU/mL <14   ENA SM Ab Ser-aCnc Latest Ref Range: 0.0 - 0.9 AI  <0.2    Complement C3, Serum Latest Ref Range: 82 - 167 mg/dL  117  SSA (Ro) (ENA) Antibody, IgG Latest Ref Range: <1.0 NEG AI <1.0 NEG   SSB (La) (ENA) Antibody, IgG Latest Ref Range: <1.0 NEG AI <1.0 NEG   Scleroderma (Scl-70) (ENA) Antibody, IgG Latest Ref Range: 0.0 - 0.9 AI <1.0 NEG 0.3    OV 02/01/2019  Subjective:  Patient ID: [name], [name] , DOB: December 06, 1945 , age 11 y.o. , MRN: [id number] , ADDRESS: [address] [place] [facility] [address]   02/01/2019 -   Chief Complaint  Patient presents with  .  Pulmonary Emphysema    Medications are working, no breathing issues.     HPI [name] 74 y.o. -returns for follow-up of her mild emphysema and pulmonary nodules.  She is on anticholinergic inhaler.  She tells me that overall she is actually feeling really great.  Denies any cough or sputum production or chills or weight loss when asked but answered differenty in CAT score She is deeply appreciative of our care.  She has reluctantly agreed for flu shot.  She says she does not trust the [employer].  I explained to her that the flu shots are fairly approved per the manufacturer is primary.  Explained to her the benefits, risks and limitations.  In terms of pulmonary nodules she has follow-up CT scan of the chest August 2020 that I personally visualized.  In the last 6 months she has more micronodules in the right lower lobe.  Radiologist is concerned about MAI.  I did explain this to her.  Currently because of the [place] pandemic she is nervous about coming to the hospital for bronchoscopy.  In addition she is also feeling relatively asymptomatic.  Therefore she wants to adopt an expectant approach.  Clearly if the infiltrates are getting worse again or she gets symptomatic then she is willing to come in for bronchoscopy.  IMPRESSION: 1. There is extensive, clustered centrilobular and tree-in-bud pulmonary nodularity bilaterally with mild associated tubular bronchiectasis  and occasional plugging. There is diffusely new and increased nodularity and new and increased small consolidations, particularly in the dependent right lower lobe (series 5, image 203). 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  2.  Aortic atherosclerosis and coronary artery disease.   Electronically Signed   By: Eddie Candle M.D.   On: 01/22/2019 14:28  xxxxxxxxxxxxxxxxxxxxxxxxxxxxxxxxxxxxxxxxxxxxxxxxx OV 03/23/2019  Subjective:  Patient ID: [name], [name] , DOB: 06-Jul-1945 , age 1 y.o. , MRN: [id number] , ADDRESS: [address] [place] [facility] [address]   03/23/2019 -   Chief Complaint  Patient presents with  . Follow-up     HPI [name] 74 y.o. -    xxxxxxxxxxxxxxxxxxxxxxxxxxxxxxxxxxxxxxxxxxxxxxxxxxxxxxxxxxxxxxxxxxxxxxxxxxxxxxxxxxxxxxxxxxxxxxxxxxx  OV 06/24/2019  Subjective:  Patient ID: [name], [name] , DOB: 07-11-1945 , age 53 y.o. , MRN: [id number] , ADDRESS: [address] [place] [facility] [address]   06/24/2019 -   Chief Complaint  Patient presents with  . Follow-up    Discuss bronchoscopy results. Pulmonary nodules.     HPI [name] 74 y.o. -presents to discuss the bronchoscopy results from mid December 2020.  She tells me that she is doing well.  She says she will not have the COVID-19 vaccine because she does not trust the government.  Overall she is doing well symptom score is minimal.  Review of the bronchoalveolar lavage from mid December 2020 shows 30,000 colony growth of MSSA.  There is positive yeast but the cultures are still pending.  AFB smear is negative.  Cytology is negative for malignant cells.  Neutrophilic returns.      CAT COPD Symptom & Quality of Life Score (GSK trademark) 0 is no burden. 5 is highest burden 05/14/2018 0 08/04/2018  02/01/2019   Never Cough -> Cough all the time 0 0 2  No phlegm in chest -> Chest is full of phlegm 0 0 2  No chest  tightness -> Chest feels very tight 0 2 3  No dyspnea for 1 flight stairs/hill -> Very dyspneic for 1  flight of stairs 3 0 2.5  No limitations for ADL at home -> Very limited with ADL at home _0 Confident leaving home -> Not at all confident leaving home 0 3 0  Sleep soundly -> Do not sleep soundly because of lung condition 0 0 0  Lots of Energy -> No energy at all 5 4 4.5  TOTAL Score (max 40)  _1 [name], [name] (MRN [id number]) as of 06/24/2019 11:04  Ref. Range 05/18/2019 13:20  Color, Fluid Latest Ref Range: YELLOW  WHITE (A)  Total Nucleated Cell Count, Fluid Latest Ref Range: 0 - 1,000 cu mm 705  Lymphs, Fluid Latest Units: % 4  Eos, Fluid Latest Units: % 1  Appearance, Fluid Latest Ref Range: CLEAR  HAZY (A)  Neutrophil Count, Fluid Latest Ref Range: 0 - 25 % 86 (H)  cytology  Negative malignanct cell  culture  30k MSSA  Monocyte-Macrophage-Serous Fluid Latest Ref Range: 50 - 90 % 9 (L)    ROS - per HPI     has a past medical history of Anxiety, Aortic atherosclerosis (HCC) (09/29/2017), Cervical disc disease, Chronic combined systolic and diastolic CHF (congestive heart failure) (HCC) (01/14/4817), Complication of anesthesia, Coronary artery disease (05/09/2015), Depression, DJD (degenerative joint disease), Dysrhythmia, Emphysema of lung ([place]), Fibromyalgia, H/O: liver disease (18 YRS AGO), History of MI (myocardial infarction) (FEW YRS AGO), History of TIA (transient ischemic attack), colonic polyps, Hyperlipidemia, Multiple sclerosis ([place]), Narcotic dependence ([place]), Osteoporosis, Positive H. pylori test, and Urinary incontinence.   reports that she quit smoking about 9 years ago. Her smoking use included cigarettes. She has a 48.00 pack-year smoking history. She has never used smokeless tobacco.  Past Surgical History:  Procedure Laterality Date  . BACK SURGERY  11-2009   LOWER BACK  . BOTOX INJECTION N/A 03/21/2016   Procedure: BOTOX INJECTION;   Surgeon: Wilford Corner, MD;  Location: [facility];  Service: Endoscopy;  Laterality: N/A;  . CARDIAC CATHETERIZATION     UNSUCCESSFUL STENT PLACEMENT  . COLONSCOPY    . ESOPHAGEAL MANOMETRY N/A 10/23/2015   Procedure: ESOPHAGEAL MANOMETRY (EM);  Surgeon: Wilford Corner, MD;  Location: [facility];  Service: Endoscopy;  Laterality: N/A;  . ESOPHAGOGASTRODUODENOSCOPY (EGD) WITH PROPOFOL N/A 03/21/2016   Procedure: ESOPHAGOGASTRODUODENOSCOPY (EGD) WITH PROPOFOL;  Surgeon: Wilford Corner, MD;  Location: [facility];  Service: Endoscopy;  Laterality: N/A;  . RIGHT/LEFT HEART CATH AND CORONARY ANGIOGRAPHY N/A 10/02/2017   Procedure: RIGHT/LEFT HEART CATH AND CORONARY ANGIOGRAPHY;  Surgeon: Nelva Bush, MD;  Location: [facility];  Service: Cardiovascular;  Laterality: N/A;  . TUBAL LIGATION    . VIDEO BRONCHOSCOPY Bilateral 05/18/2019   Procedure: VIDEO BRONCHOSCOPY WITHOUT FLUORO;  Surgeon: Brand Males, MD;  Location: [facility] ENDOSCOPY;  Service: Endoscopy;  Laterality: Bilateral;    Allergies  Allergen Reactions  . Contrast Media [Iodinated Diagnostic Agents] Rash and Other (See Comments)    Severe rash  . Crestor [Rosuvastatin] Hives and Other (See Comments)    welps  . Sulfonamide Derivatives Hives    As a child    Immunization History  Administered Date(s) Administered  . Fluad Quad(high Dose 65+) 02/01/2019, 03/23/2019  . Influenza Split 02/20/2011, 02/20/2012, 03/03/2014  . Influenza Whole 02/17/2017  . Influenza, High Dose Seasonal PF 02/09/2018  . Influenza,inj,Quad PF,6+ Mos 02/19/2013, 02/01/2019  . Influenza-Unspecified 02/04/2017  . Pneumococcal Conjugate-13 02/05/2017  . Pneumococcal Polysaccharide-23 06/01/2012, 03/03/2014  . Td 06/03/2002  . Tdap  11/10/2012    Family History  Problem Relation Age of Onset  . Heart attack Mother 89  . Heart attack Father 9  . Cancer Brother      Current Outpatient Medications:  .  Ascorbic Acid (VITAMIN  C) POWD, Take 1,000 mg by mouth daily. Energizer packet, Disp: , Rfl:  .  aspirin EC 81 MG tablet, Take 324 mg by mouth daily. , Disp: , Rfl:  .  benzonatate (TESSALON) 200 MG capsule, Take 1 capsule (200 mg total) by mouth 3 (three) times daily as needed for cough. (Patient taking differently: Take 200 mg by mouth at bedtime. ), Disp: 30 capsule, Rfl: 1 .  CALCIUM PO, Take 1 tablet by mouth daily., Disp: , Rfl:  .  Cholecalciferol (VITAMIN D3) 125 MCG (5000 UT) CAPS, Take 5,000 Units by mouth daily. , Disp: , Rfl:  .  clonazePAM (KLONOPIN) 1 MG tablet, Take 1 mg by mouth 3 (three) times daily as needed for anxiety. , Disp: , Rfl:  .  COENZYME Q10 PO, Take 1 capsule by mouth daily. , Disp: , Rfl:  .  diphenhydrAMINE (BENADRYL) 50 MG capsule, Take 50 mg by mouth at bedtime as needed for sleep. , Disp: , Rfl:  .  doxepin (SINEQUAN) 25 MG capsule, Take 1 capsule (25 mg total) by mouth at bedtime., Disp: 30 capsule, Rfl: 5 .  fluticasone (FLONASE) 50 MCG/ACT nasal spray, Place 2 sprays into both nostrils daily as needed for rhinitis., Disp: , Rfl:  .  gabapentin (NEURONTIN) 100 MG capsule, Take 100 mg by mouth daily as needed (Pain). , Disp: , Rfl:  .  gabapentin (NEURONTIN) 300 MG capsule, Take 1 capsule (300 mg total) by mouth daily. (Patient taking differently: Take 300 mg by mouth at bedtime. ), Disp: 180 capsule, Rfl: 1 .  GLATOPA 20 MG/ML SOSY injection, USE 1 INJECTION SUBCUTANEOUSLY ONCE A DAY (Patient taking differently: Inject 20 mg into the skin daily. ), Disp: 30 mL, Rfl: 3 .  guaiFENesin (MUCINEX) 600 MG 12 hr tablet, Take 600 mg by mouth 2 (two) times daily as needed for cough or to loosen phlegm. , Disp: , Rfl:  .  Homeopathic Products (SIMILASAN DRY EYE RELIEF OP), Place 1 drop into both eyes 2 (two) times daily., Disp: , Rfl:  .  INCRUSE ELLIPTA 62.5 MCG/INH AEPB, INHALE 1 PUFF INTO THE LUNGS ONCE DAILY (Patient taking differently: Inhale 1 puff into the lungs daily. ), Disp: 30 each,  Rfl: 4 .  ipratropium (ATROVENT) 0.02 % nebulizer solution, Take 2.5 mLs (0.5 mg total) by nebulization 4 (four) times daily., Disp: 120 mL, Rfl: 12 .  losartan (COZAAR) 25 MG tablet, TAKE 1 TABLET BY MOUTH TWICE (2) DAILY, Disp: 180 tablet, Rfl: 1 .  lovastatin (MEVACOR) 40 MG tablet, TAKE 1 TABLET BY MOUTH ONCE DAILY EVERY EVENING (Patient taking differently: Take 40 mg by mouth at bedtime. ), Disp: 90 tablet, Rfl: 1 .  Melatonin 5 MG TABS, Take 5 mg by mouth at bedtime., Disp: , Rfl:  .  metoprolol succinate (TOPROL-XL) 25 MG 24 hr tablet, Take 1 tablet (25 mg total) by mouth daily. (Patient taking differently: Take 12.5 mg by mouth 2 (two) times daily. ), Disp: 90 tablet, Rfl: 2 .  modafinil (PROVIGIL) 200 MG tablet, TAKE 1 TABLET BY MOUTH TWICE A DAY (Patient taking differently: Take 200 mg by mouth 2 (two) times daily. ), Disp: 60 tablet, Rfl: 5 .  Multiple Vitamin (MULTIVITAMIN WITH MINERALS) TABS tablet,  Take 1 tablet by mouth daily., Disp: , Rfl:  .  nitroGLYCERIN (NITROSTAT) 0.4 MG SL tablet, DISSOLVE 1 TABLET UNDER TONGUE AS NEEDEDFOR CHEST PAIN. MAY REPEAT 5 MINUTES APART 3 TIMES IF NEEDED (Patient taking differently: Place 0.4 mg under the tongue every 5 (five) minutes as needed for chest pain. ), Disp: 25 tablet, Rfl: 3 .  NUCYNTA ER 50 MG TB12, Take 50 mg by mouth every 12 (twelve) hours. , Disp: , Rfl:  .  Omega-3 Fatty Acids (OMEGA 3 PO), Take 1 capsule by mouth daily., Disp: , Rfl:  .  omeprazole (PRILOSEC) 20 MG capsule, TAKE 1 CAPSULE BY MOUTH ONCE DAILY (Patient taking differently: Take 20 mg by mouth daily. ), Disp: 30 capsule, Rfl: 3 .  oxyCODONE-acetaminophen (PERCOCET) 5-325 MG per tablet, Take 1 tablet by mouth every 4 (four) hours as needed for moderate pain or severe pain. , Disp: , Rfl:  .  polyethylene glycol powder (GLYCOLAX/MIRALAX) powder, Take 17 g by mouth daily as needed for mild constipation or moderate constipation. , Disp: , Rfl:  .  PRISTIQ 50 MG 24 hr tablet,  Take 50 mg by mouth every evening. , Disp: , Rfl:  .  PROAIR HFA 108 (90 Base) MCG/ACT inhaler, INHALE 2 PUFFS INTO THE LUNGS EVERY 6 HOURS AS NEEDED FOR WHEEZING OR SHORTNESS OF BREATH, Disp: 8.5 g, Rfl: 3 .  vitamin B-12 (CYANOCOBALAMIN) 1000 MCG tablet, Take 1,500 mcg by mouth daily., Disp: , Rfl:  .  isosorbide mononitrate (IMDUR) 60 MG 24 hr tablet, Take 1.5 tablets (90 mg total) by mouth daily., Disp: 135 tablet, Rfl: 3      Objective:   Vitals:   06/24/19 1056  BP: 116/68  Pulse: 76  Temp: (!) 97.3 F (36.3 C)  SpO2: 95%  Weight: 118 lb 9.6 oz (53.8 kg)  Height: _0  (1.549 m)    Estimated body mass index is 22.41 kg/m as calculated from the following:   Height as of this encounter: _1  (1.549 m).   Weight as of this encounter: 118 lb 9.6 oz (53.8 kg).  _2 @  Filed Weights   06/24/19 1056  Weight: 118 lb 9.6 oz (53.8 kg)     Physical Exam Thin pleasant [name].  Clear to auscultation bilaterally alert and oriented x3.  Speech is normal.  Gait is normal.  Heart sounds are normal and abdomen is soft.         Assessment:       ICD-10-CM   1. MSSA (methicillin susceptible Staphylococcus aureus) infection  A49.01   2. Pulmonary nodules  R91.8   3. Other emphysema ([place])  J43.8    MSSA is new problem    Plan:     Patient Instructions  Other emphysema ([place])  - Continue incruse daily scheduled and do nebulizer as needed  - compliance important    Pulmonary nodules - small right upper lobe and right lower lobe from MSSA   -increased feb 2020 -> Aug 2020 but glad you are feeling ok - culture 05/18/2019 grew low colony growth of MSSA - ? Yeast growing  Plan   - Take doxycycline 132m po twice daily x 10 days; take after meals and avoid sunlight - await AFB and fungal cultures   Followup 3 months clinical followup; CAT score at followup - can decide on timing of repeat CT chest at followup         SIGNATURE    Dr. MBrand Males M.D., F.C.C.P,  Pulmonary and Critical Care Medicine Staff Physician, [place] Director - Interstitial Lung Disease  Program  Pulmonary [place] [facility] [place], [place], [address]  Pager: [phone], If no answer or between  15:00h - 7:00h: call [phone] Telephone: [phone]  11:26 AM 06/24/2019

## 2019-07-01 LAB — ACID FAST CULTURE WITH REFLEXED SENSITIVITIES (MYCOBACTERIA): Acid Fast Culture: NEGATIVE

## 2019-07-05 ENCOUNTER — Other Ambulatory Visit: Payer: Self-pay | Admitting: Cardiology

## 2019-07-05 ENCOUNTER — Other Ambulatory Visit: Payer: Self-pay | Admitting: Internal Medicine

## 2019-07-05 ENCOUNTER — Other Ambulatory Visit: Payer: Self-pay | Admitting: Neurology

## 2019-07-07 ENCOUNTER — Other Ambulatory Visit: Payer: Self-pay | Admitting: Neurology

## 2019-07-07 ENCOUNTER — Encounter: Payer: Self-pay | Admitting: *Deleted

## 2019-07-07 NOTE — Progress Notes (Addendum)
[name] Key: [id number] - PA Case ID: [id number] Need help? Call us at [phone] Status Sent to [facility] Modafinil 200MG  OR TABS Form [employer] Medicare Part D Electronic Prior Authorization Form (2017 NCPDP)  Metta Clines [address] [place], [place] [address] Hours of Operations: Address: 5 a.m. - 10 p.m. PT, Monday-Friday [id number] 6 a.m. - 3 p.m. PT, [place] [address] Date: 07/07/2019 To: Metta Clines From: [facility] Phone: [phone] Phone: [phone] Fax: [phone] Reference #: [id number] RE: Prior Authorization Request Patient Name: [name] Patient DOB: Sep 27, 1945 Patient ID: [id number] Status of Request: Approve Medication Name: Modafinil Tab 200mg  GPI/NDC: [id number] Decision Notes: MODAFINIL TAB 200MG , use as directed, is approved through 01/04/2020 under your Medicare Part D benefit. Reviewed by: System If the treating physician would like to discuss this coverage decision with the physician or health care professional reviewer, please call [employer] Prior Authorization department at [phone]. Letter sent to scan into her chart

## 2019-07-09 ENCOUNTER — Telehealth: Payer: Self-pay | Admitting: Neurology

## 2019-07-09 ENCOUNTER — Other Ambulatory Visit: Payer: Self-pay | Admitting: Neurology

## 2019-07-09 NOTE — Telephone Encounter (Signed)
[facility] called needing a verbal order regarding this patient. They have sent over faxes as well. Patient has called them a few times regarding Provigil. Thank you

## 2019-07-15 ENCOUNTER — Telehealth: Payer: Self-pay | Admitting: Internal Medicine

## 2019-07-15 NOTE — Telephone Encounter (Signed)
Called and spoke with pt who stated her SOB increased 3 days ago. Pt stated she has used her nebulizer to see if it would help with her symptoms.   Pt has taken all meds as prescribed.   Pt has not been running any fever, has not had to be tested for covid for any reason recently, has not been around anyone that has been exposed to covid. Nothing furhter needed.  Due to pt possibly having a flare up with her COPD, pt has been scheduled an appt with [facility] tomorrow 2/12 at 4pm. Pt wanted to come in to the office for the appt instead of having a televisit. Beth, please advise if you are okay with pt coming in to the office tomorrow. appt has been made as an acute visit due to possible copd flare but if we need to change it to a televisit, just let us know.

## 2019-07-16 ENCOUNTER — Encounter: Payer: Self-pay | Admitting: Internal Medicine

## 2019-07-16 ENCOUNTER — Ambulatory Visit: Payer: Medicare Other | Admitting: Primary Care

## 2019-07-16 LAB — CULTURE, FUNGUS WITHOUT SMEAR: Special Requests: NORMAL

## 2019-07-16 NOTE — Progress Notes (Signed)
xxxxxxxxxxxxxxxxxx

## 2019-07-16 NOTE — Telephone Encounter (Signed)
If she hasn't been tested recently and has not had the vaccine need to be televisit.

## 2019-07-16 NOTE — Telephone Encounter (Signed)
Checked pt's chart as I was going to get this changed to a televisit and I saw that pt's appt was cancelled. Pt called today, 2/12 and cancelled her appt. Nothing further needed.

## 2019-07-21 ENCOUNTER — Telehealth: Payer: Self-pay

## 2019-07-21 NOTE — Telephone Encounter (Signed)
Pt states that at 1730 on yesterday she stated to have chest pain. She states the pain felt as if someone was sitting on her chest. Pt did have SOB. She took a nitro at that time and laid down. Pt states that she took a total of 5 nitro yesterday.  Pt denies chest pain at this time. Pt is SOB. BP is 171/80. Pt c/o feeling tired this morning. Pt states she has used nebulizer on yesterday and this morning. Pt encouraged to be seen in ER. Pt refused and stated that if chest pain returns or SOB becomes worse she will call EMS.

## 2019-07-21 NOTE — Telephone Encounter (Signed)
Pt notified of date and time of appt.

## 2019-07-21 NOTE — Telephone Encounter (Signed)
Agree with ER evaluation for quickest workup of cause of her symptoms. With the weather coming this week would not be able to work her into clinic, could add her on next week if no open slots can put in 940 slot if does not want ER evaluatoin    J BranchMD

## 2019-07-21 NOTE — Telephone Encounter (Signed)
07-21-19/rec'd call from Pt stating she had a bad night and doesn't feel well now. Pt's states she used Nitro last night.  Please call [phone]  Thanks renee

## 2019-07-27 ENCOUNTER — Other Ambulatory Visit: Payer: Self-pay

## 2019-07-27 ENCOUNTER — Ambulatory Visit: Payer: Medicare Other | Admitting: Cardiology

## 2019-07-27 ENCOUNTER — Encounter (INDEPENDENT_AMBULATORY_CARE_PROVIDER_SITE_OTHER): Payer: Self-pay

## 2019-07-27 ENCOUNTER — Ambulatory Visit (INDEPENDENT_AMBULATORY_CARE_PROVIDER_SITE_OTHER)
Admission: RE | Admit: 2019-07-27 | Discharge: 2019-07-27 | Disposition: A | Discharge status: Home / Self Care | Payer: Medicare Other | Source: Ambulatory Visit | Attending: Internal Medicine | Admitting: Internal Medicine

## 2019-07-27 DIAGNOSIS — R918 Other nonspecific abnormal finding of lung field: Secondary | ICD-10-CM

## 2019-07-27 DIAGNOSIS — J438 Other emphysema: Secondary | ICD-10-CM

## 2019-07-29 ENCOUNTER — Other Ambulatory Visit: Payer: Self-pay

## 2019-07-29 ENCOUNTER — Encounter: Payer: Self-pay | Admitting: Cardiology

## 2019-07-29 ENCOUNTER — Ambulatory Visit (INDEPENDENT_AMBULATORY_CARE_PROVIDER_SITE_OTHER): Payer: Medicare Other | Admitting: Cardiology

## 2019-07-29 VITALS — BP 149/87 | HR 95 | Temp 98.3°F | Ht 61.0 in | Wt 120.0 lb

## 2019-07-29 DIAGNOSIS — I25119 Atherosclerotic heart disease of native coronary artery with unspecified angina pectoris: Secondary | ICD-10-CM | POA: Diagnosis not present

## 2019-07-29 DIAGNOSIS — R079 Chest pain, unspecified: Secondary | ICD-10-CM | POA: Diagnosis not present

## 2019-07-29 DIAGNOSIS — R0789 Other chest pain: Secondary | ICD-10-CM | POA: Diagnosis not present

## 2019-07-29 NOTE — Patient Instructions (Signed)
Medication Instructions:  Your physician recommends that you continue on your current medications as directed. Please refer to the Current Medication list given to you today.   Labwork: NONE  Testing/Procedures: Your physician has requested that you have a lexiscan myoview. For further information please visit [email or web address]. Please follow instruction sheet, as given.    Follow-Up: Your physician recommends that you schedule a follow-up appointment in: 6 WEEKS    Any Other Special Instructions Will Be Listed Below (If Applicable).     If you need a refill on your cardiac medications before your next appointment, please call your pharmacy.

## 2019-07-29 NOTE — Progress Notes (Signed)
There is improvement in the nodules in the right lower lobe.  We will discuss findings during office visit August 02, 2019.  There are fractures noted in the anterior right fifth and sixth ribs there is new since August 2020.  Please let her know

## 2019-07-29 NOTE — Progress Notes (Signed)
Clinical Summary Ms. Beaubien is a 74 y.o.female seen today for a focused visit for recent symptoms of chest pain.    1. CAD  10/2017 cath: LM patent, LAD prox 40%, OM1 60%, patent RCA. Mean PA 17, PCWP 15, CI 2.9  Overall mild to moderate nonobstructive disease stable from 2012 cath - 10/2017 LVEF 50-55%, no WMAs, grade I diastolic dysfunction - 11/5033 Dr Einar Gip  nuclear stress: no clear ischemia - 2018 Dr Einar Gip echo: LVEF 35-40% - 02/2018 48 hr holter [facility]: benign atrial and ventricular ectopy  - pulm notes history of pleuritic pain - compliant with meds.  - rare chest pains at times, will use SL Ng with improvement.   Toprol stopped she reports due to fatigue in the past, though unclear if was clearly related   - episode 1 week ago. Midchest pressure, 9/10. Does not recall other symptoms. Took NG x5, not improving. Pain lasted 15-20 minutes. Not positional. Isolated episode -    2. Pulmonary infiltrates - s/p bronch, CT scan with suggestion of atypical mycobacterial infection   SH: Josph Macho is her brother, also a patient of mine.    Past Medical History:  Diagnosis Date  . Anxiety   . Aortic atherosclerosis ([place]) 09/29/2017   High Res CT in 9/18: aortic atherosclerosis; coronary artery calcification  . Cervical disc disease    BULGING  . Chronic combined systolic and diastolic CHF (congestive heart failure) ([place]) 09/29/2017   Echo 7/18: Moderate concentric LVH, grade 1 diastolic dysfunction, abnormal septal wall motion due to LBBB, moderate diffuse HK, EF 35-40, atrial septal aneurysm with possible PFO, mild TR // Echo 5/19: EF 50-55, normal wall motion, grade 1 diastolic dysfunction, trivial TR  . Complication of anesthesia    SLOW TO AWAKEN AFTER LAST COLONSCOPY  . Coronary artery disease 05/09/2015   LHC 10/12: EF 55, OM1 80-90 - difficult to engage >> treated medically // Nuc 8/18: EF 40, no ischemia  . Depression   . DJD (degenerative joint disease)    OA  . Dysrhythmia    Not clear where this diagnosis came from  . Emphysema of lung ([place])    Dr. Chase Caller  . Fibromyalgia   . H/O: liver disease 80 YRS AGO   tranamanitis due to previous interferon - LFTs improved off of  . History of MI (myocardial infarction) FEW YRS AGO  . History of TIA (transient ischemic attack)   . Hx of colonic polyps   . Hyperlipidemia    intol to statin - myalgias // ESPERION trial - patient stopped  . Multiple sclerosis ([place])   . Narcotic dependence (HCC)    hx of benzodiazepine and narcotic  . Osteoporosis   . Positive H. pylori test   . Urinary incontinence      Allergies  Allergen Reactions  . Contrast Media [Iodinated Diagnostic Agents] Rash and Other (See Comments)    Severe rash  . Crestor [Rosuvastatin] Hives and Other (See Comments)    welps  . Sulfonamide Derivatives Hives    As a child     Current Outpatient Medications  Medication Sig Dispense Refill  . Ascorbic Acid (VITAMIN C) POWD Take 1,000 mg by mouth daily. Energizer packet    . aspirin EC 81 MG tablet Take 324 mg by mouth daily.     . benzonatate (TESSALON) 200 MG capsule Take 1 capsule (200 mg total) by mouth 3 (three) times daily as needed for cough. (Patient taking differently: Take 200  mg by mouth at bedtime. ) 30 capsule 1  . CALCIUM PO Take 1 tablet by mouth daily.    . Cholecalciferol (VITAMIN D3) 125 MCG (5000 UT) CAPS Take 5,000 Units by mouth daily.     . clonazePAM (KLONOPIN) 1 MG tablet Take 1 mg by mouth 3 (three) times daily as needed for anxiety.     [name] COENZYME Q10 PO Take 1 capsule by mouth daily.     . diphenhydrAMINE (BENADRYL) 50 MG capsule Take 50 mg by mouth at bedtime as needed for sleep.     [name] doxepin (SINEQUAN) 25 MG capsule Take 1 capsule (25 mg total) by mouth at bedtime. 30 capsule 5  . doxycycline (VIBRA-TABS) 100 MG tablet Take 1 tablet (100 mg total) by mouth 2 (two) times daily. 10 tablet 0  . fluticasone (FLONASE) 50 MCG/ACT nasal spray Place 2  sprays into both nostrils daily as needed for rhinitis.    [name] gabapentin (NEURONTIN) 100 MG capsule Take 100 mg by mouth daily as needed (Pain).     [name] gabapentin (NEURONTIN) 300 MG capsule TAKE 1 CAPSULE BY MOUTH DAILY 180 capsule 1  . GLATOPA 20 MG/ML SOSY injection USE 1 INJECTION SUBCUTANEOUSLY ONCE A DAY (Patient taking differently: Inject 20 mg into the skin daily. ) 30 mL 3  . guaiFENesin (MUCINEX) 600 MG 12 hr tablet Take 600 mg by mouth 2 (two) times daily as needed for cough or to loosen phlegm.     . Homeopathic Products (SIMILASAN DRY EYE RELIEF OP) Place 1 drop into both eyes 2 (two) times daily.    . INCRUSE ELLIPTA 62.5 MCG/INH AEPB INHALE 1 PUFF INTO THE LUNGS ONCE DAILY (Patient taking differently: Inhale 1 puff into the lungs daily. ) 30 each 4  . ipratropium (ATROVENT) 0.02 % nebulizer solution Take 2.5 mLs (0.5 mg total) by nebulization 4 (four) times daily. 120 mL 12  . isosorbide mononitrate (IMDUR) 60 MG 24 hr tablet TAKE 1 AND 1/2 TABLETS (90 MG) BY MOUTH ONCE DAILY 135 tablet 3  . losartan (COZAAR) 25 MG tablet TAKE 1 TABLET BY MOUTH TWICE (2) DAILY 180 tablet 1  . lovastatin (MEVACOR) 40 MG tablet Take 1 tablet (40 mg total) by mouth at bedtime. 90 tablet 3  . Melatonin 5 MG TABS Take 5 mg by mouth at bedtime.    . metoprolol succinate (TOPROL-XL) 25 MG 24 hr tablet Take 1 tablet (25 mg total) by mouth daily. (Patient taking differently: Take 12.5 mg by mouth 2 (two) times daily. ) 90 tablet 2  . modafinil (PROVIGIL) 200 MG tablet TAKE 1 TABLET BY MOUTH TWICE A DAY 60 tablet 1  . Multiple Vitamin (MULTIVITAMIN WITH MINERALS) TABS tablet Take 1 tablet by mouth daily.    . nitroGLYCERIN (NITROSTAT) 0.4 MG SL tablet DISSOLVE 1 TABLET UNDER TONGUE AS NEEDEDFOR CHEST PAIN. MAY REPEAT 5 MINUTES APART 3 TIMES IF NEEDED (Patient taking differently: Place 0.4 mg under the tongue every 5 (five) minutes as needed for chest pain. ) 25 tablet 3  . NUCYNTA ER 50 MG TB12 Take 50 mg by mouth  every 12 (twelve) hours.     . Omega-3 Fatty Acids (OMEGA 3 PO) Take 1 capsule by mouth daily.    [name] omeprazole (PRILOSEC) 20 MG capsule Take 1 capsule (20 mg total) by mouth daily. 30 capsule 3  . oxyCODONE-acetaminophen (PERCOCET) 5-325 MG per tablet Take 1 tablet by mouth every 4 (four) hours as needed for moderate pain or  severe pain.     . polyethylene glycol powder (GLYCOLAX/MIRALAX) powder Take 17 g by mouth daily as needed for mild constipation or moderate constipation.     [name] PRISTIQ 50 MG 24 hr tablet Take 50 mg by mouth every evening.     [name] PROAIR HFA 108 (90 Base) MCG/ACT inhaler INHALE 2 PUFFS INTO THE LUNGS EVERY 6 HOURS AS NEEDED FOR WHEEZING OR SHORTNESS OF BREATH 8.5 g 3  . vitamin B-12 (CYANOCOBALAMIN) 1000 MCG tablet Take 1,500 mcg by mouth daily.     No current facility-administered medications for this visit.     Past Surgical History:  Procedure Laterality Date  . BACK SURGERY  11-2009   LOWER BACK  . BOTOX INJECTION N/A 03/21/2016   Procedure: BOTOX INJECTION;  Surgeon: Wilford Corner, MD;  Location: [facility];  Service: Endoscopy;  Laterality: N/A;  . CARDIAC CATHETERIZATION     UNSUCCESSFUL STENT PLACEMENT  . COLONSCOPY    . ESOPHAGEAL MANOMETRY N/A 10/23/2015   Procedure: ESOPHAGEAL MANOMETRY (EM);  Surgeon: Wilford Corner, MD;  Location: [facility];  Service: Endoscopy;  Laterality: N/A;  . ESOPHAGOGASTRODUODENOSCOPY (EGD) WITH PROPOFOL N/A 03/21/2016   Procedure: ESOPHAGOGASTRODUODENOSCOPY (EGD) WITH PROPOFOL;  Surgeon: Wilford Corner, MD;  Location: [facility];  Service: Endoscopy;  Laterality: N/A;  . RIGHT/LEFT HEART CATH AND CORONARY ANGIOGRAPHY N/A 10/02/2017   Procedure: RIGHT/LEFT HEART CATH AND CORONARY ANGIOGRAPHY;  Surgeon: Nelva Bush, MD;  Location: [place] [facility];  Service: Cardiovascular;  Laterality: N/A;  . TUBAL LIGATION    . VIDEO BRONCHOSCOPY Bilateral 05/18/2019   Procedure: VIDEO BRONCHOSCOPY WITHOUT FLUORO;  Surgeon:  Brand Males, MD;  Location: [facility] ENDOSCOPY;  Service: Endoscopy;  Laterality: Bilateral;     Allergies  Allergen Reactions  . Contrast Media [Iodinated Diagnostic Agents] Rash and Other (See Comments)    Severe rash  . Crestor [Rosuvastatin] Hives and Other (See Comments)    welps  . Sulfonamide Derivatives Hives    As a child      Family History  Problem Relation Age of Onset  . Heart attack Mother 59  . Heart attack Father 4  . Cancer Brother      Social History Ms. Verno reports that she quit smoking about 9 years ago. Her smoking use included cigarettes. She has a 48.00 pack-year smoking history. She has never used smokeless tobacco. Ms. Spatz reports no history of alcohol use.   Review of Systems CONSTITUTIONAL: No weight loss, fever, chills, weakness or fatigue.  HEENT: Eyes: No visual loss, blurred vision, double vision or yellow sclerae.No hearing loss, sneezing, congestion, runny nose or sore throat.  SKIN: No rash or itching.  CARDIOVASCULAR: per hpi RESPIRATORY: No shortness of breath, cough or sputum.  GASTROINTESTINAL: No anorexia, nausea, vomiting or diarrhea. No abdominal pain or blood.  GENITOURINARY: No burning on urination, no polyuria NEUROLOGICAL: No headache, dizziness, syncope, paralysis, ataxia, numbness or tingling in the extremities. No change in bowel or bladder control.  MUSCULOSKELETAL: No muscle, back pain, joint pain or stiffness.  LYMPHATICS: No enlarged nodes. No history of splenectomy.  PSYCHIATRIC: No history of depression or anxiety.  ENDOCRINOLOGIC: No reports of sweating, cold or heat intolerance. No polyuria or polydipsia.  [name]   Physical Examination Today's Vitals   07/29/19 0955  BP: (!) 149/87  Pulse: 95  Temp: 98.3 F (36.8 C)  TempSrc: Temporal  SpO2: 97%  Weight: 120 lb (54.4 kg)  Height: 5\' 1"  (1.549 m)   Body mass index is 22.67  kg/m.  Gen: resting comfortably, no acute distress HEENT: no scleral icterus,  pupils equal round and reactive, no palptable cervical adenopathy,  CV: RRR, no m/r/g, no jvd Resp: Clear to auscultation bilaterally GI: abdomen is soft, non-tender, non-distended, normal bowel sounds, no hepatosplenomegaly MSK: extremities are warm, no edema.  Skin: warm, no rash Neuro:  no focal deficits Psych: appropriate affect   Diagnostic Studies 10/2017 echo Study Conclusions   - Left ventricle: The cavity size was normal. Systolic function was  normal. The estimated ejection fraction was in the range of 50%  to 55%. Wall motion was normal; there were no regional wall  motion abnormalities. There was an increased relative  contribution of atrial contraction to ventricular filling.  Doppler parameters are consistent with abnormal left ventricular  relaxation (grade 1 diastolic dysfunction). Doppler parameters  are consistent with high ventricular filling pressure.  - Ventricular septum: Septal motion showed paradox.  - Tricuspid valve: There was trivial regurgitation.   10/2017 LHC/RHC Conclusions: 1. Mild to moderate, non-obstructive coronary artery disease involving the proximal/mid LAD and OM1.  Overall appearance is similar to prior catheterizations in 2012. 2. Low normal to mildly reduced left ventricular contraction (LVEF ~50%). 3. Upper normal left and right heart filling pressures. 4. Normal pulmonary artery pressure. 5. Mildly elevated transpulmonary valve gradient. 6. Normal Fick cardiac output/index.  Recommendations: 1. Continue medical therapy.  As chest pain has improved with addition of isosorbide mononitrate, further uptitration should be considered in the future for relief of symptoms. 2. Risk factor modification and medical therapy to prevent progression of coronary artery disease.     Assessment and Plan  1. CAD -recent chest pain symptoms. She has had prior chest pains with negative stress testing in 2018 and cath in 2019 - with  recurrent symptoms and 2 years since last testing will plan for a lexiscan     F/u 6 months virtual  Arnoldo Lenis, M.D.

## 2019-08-02 ENCOUNTER — Encounter: Payer: Self-pay | Admitting: *Deleted

## 2019-08-02 ENCOUNTER — Other Ambulatory Visit: Payer: Self-pay

## 2019-08-02 ENCOUNTER — Encounter: Payer: Self-pay | Admitting: Internal Medicine

## 2019-08-02 ENCOUNTER — Ambulatory Visit: Payer: Medicare Other | Admitting: Internal Medicine

## 2019-08-02 VITALS — BP 140/88 | HR 99 | Ht 61.0 in | Wt 116.6 lb

## 2019-08-02 DIAGNOSIS — Z0289 Encounter for other administrative examinations: Secondary | ICD-10-CM

## 2019-08-02 DIAGNOSIS — R918 Other nonspecific abnormal finding of lung field: Secondary | ICD-10-CM

## 2019-08-02 DIAGNOSIS — T7411XA Adult physical abuse, confirmed, initial encounter: Secondary | ICD-10-CM | POA: Diagnosis not present

## 2019-08-02 DIAGNOSIS — J438 Other emphysema: Secondary | ICD-10-CM | POA: Diagnosis not present
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
## 2019-08-03 ENCOUNTER — Other Ambulatory Visit: Payer: Self-pay | Admitting: Internal Medicine

## 2019-08-06 ENCOUNTER — Other Ambulatory Visit: Payer: Self-pay | Admitting: Cardiology

## 2019-08-17 ENCOUNTER — Encounter (HOSPITAL_COMMUNITY): Payer: Medicare Other

## 2019-08-18 ENCOUNTER — Encounter (HOSPITAL_COMMUNITY): Payer: Medicare Other

## 2019-09-01 ENCOUNTER — Telehealth: Payer: Self-pay | Admitting: Neurology

## 2019-09-01 ENCOUNTER — Encounter: Payer: Self-pay | Admitting: Neurology

## 2019-09-01 NOTE — Telephone Encounter (Signed)
Patient called again and left a message requesting to switch her appointment with Dr. Tomi Likens on 09/06/19 to a virtual visit. Will this be okay?

## 2019-09-01 NOTE — Telephone Encounter (Signed)
Patient called with concerns she may not be able to keep her follow up appointment with Dr. Tomi Likens on 09/06/19. She said, "I have a heart condition and have been having to take my nitroglycerin more lately. I just want him to know how much I appreciate all the help in case I don't make it."  Patient requested refills on her generic Provigil 200 MG in case she cannot keep her scheduled appointment.   [place] in [place]  Please call patient to check in with her.

## 2019-09-01 NOTE — Telephone Encounter (Signed)
Spoke with patient and appointment changed to virtual.  Refill pended for then.

## 2019-09-02 ENCOUNTER — Ambulatory Visit: Payer: Medicare Other

## 2019-09-02 ENCOUNTER — Telehealth: Payer: Self-pay | Admitting: Cardiology

## 2019-09-02 ENCOUNTER — Other Ambulatory Visit: Payer: Medicare Other

## 2019-09-02 NOTE — Telephone Encounter (Signed)
Patient would like to know if your heart will cause your teeth to hurt on one side then on other side.

## 2019-09-02 NOTE — Telephone Encounter (Signed)
Pt had tooth extracted about a month ago on the right side and another one needs extracting on the left side extracted and is scheduled for that tomorrow - and has had some pain in her jaw mostly on the left side but it come and goes on both sides - denies chest pain/dizziness/SOB/swelling said she has taken few NTG in that last week or so and this relieved her pain - pt will f/u after she her other tooth removed - is scheduled to have stress test in April and is concerned that she will be to weak to do this - pt will f/u with [place] again if jaw/chest pain persist after tooth removal - pt is agreeable to have stress test

## 2019-09-05 NOTE — Progress Notes (Signed)
MRN: [id number] DOB:  05-03-1946  History of Present Illness:  [name] is a 74 year old right-handed female with CAD,COPD,emphysema, CHF,thyroid disease, IBS, arthritis, depression, fibromyalgia who follows up for MS.  UPDATE: Current medications:  Gabapentin 100mg  at bedtime (300mg  causes drowsiness) Clonazepam 1mg  during day and 2mg  at bedtime for spasms D3 2000 IU daily Provigil 200mg   Vision:Permanent vision loss in left eye Motor: Stable. Intermittent head shaking Sensory: stable Pain: Pain in arms and legs. Lumbar radiculopathy Gait: Sometimes requires ambulation with cane or walker. Bowel/Bladder: Stable Fatigue: Yes Cognition: Short-term memory deficits. Mood: Depressed. She reports difficulty with speech. She has trouble with pronouncing them. It has been ongoing for several months but has gotten worse.   HISTORY: First symptom occurred in 1995, when she developed vision loss in the left eye, as well as left eye pain.MRI of the brain with and without contrast was performed on 03/28/95 revealed 10-12 periventricular white matter lesions and one lesion in the corpus callosum, suspicious for demyelination.There was also enhancement of the left optic nerve.She was referred to Dr. Delphia Grates, an MS specialist.Lumbar puncture was performed, revealing no oligoclonal bands or myelin basic protein.She has not required hospitalization or steroids since the late 1990s.She was on Betaseron and later Copaxone.Copaxone was discontinued due to longstanding stability, but due to fatigue and cognitive issues, it was restarted in 2014.Her continued symptoms are episodes of worsening fatigue, as well as cognitive issues.Sometimes, she has difficulty thinking clearly.She will get disoriented while driving.Other times, she has word-finding difficulties.She once overpaid a bill.She still performs all ADLs, including paying the bills.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  Past disease-modifying agents:Betaseron (elevated LFTs and necrotic injection sites)  For fatigue, she takes Provigil.Adderal was ineffective.Amantadine was ineffective. For pain in the arms and legs, she takes gabapentin 300mg  at bedtime.She reportedly has lumbar radiculopathy which also contributes to the leg pain. Initially, she reportedly had significant spasticity.She was on oral baclofen which did not help.At one point, a baclofen pump was entertained.She currently takes clonazepam 1mg  during the day and 2mg  at bedtime, which is effective.  Other imaging: 05/07/95 MRI LUMBAR CZ:YSAY disc bulging at L4-5 without disc herniation or central canal stenosis. 03/09/96 MRI BRAIN W/WO (comparing to MRI from 03/28/95):slight increase in number of white matter lesions adjacent to the atrium of the right lateral ventricle, otherwise unchanged. 03/28/98 MRI BRAIN W/WO (comparing to MRI from 03/25/97):several subtle areas of increased signal in the deep white matter, not as prevalent as on prior study.No abnormal enhancement. 08/11/13 MRI of brain with and without contrast :multiple supratentorial and infratentorial T2 hyperintensities, but no post-contrast enhancement.However, this was not compared to prior imaging. 01/28/14 MRI Cervical spine without contrast:spinal cord unremarkable.[name] MRI of brain with and without contrast from 08/17/14 is stable MRI of brain without contrast from 05/08/15 is stable. MRI of brain with and without contrast from 03/04/17 unchanged from prior study on 05/08/15.  Past Medical History: Past Medical History:  Diagnosis Date  . Anxiety   . Aortic atherosclerosis ([place]) 09/29/2017   High Res CT in 9/18: aortic atherosclerosis; coronary artery calcification  . Cervical disc  disease    BULGING  . Chronic combined systolic and diastolic CHF (congestive heart failure) ([place]) 09/29/2017   Echo 7/18: Moderate concentric LVH, grade 1 diastolic dysfunction, abnormal septal wall motion due to LBBB, moderate  diffuse HK, EF 35-40, atrial septal aneurysm with possible PFO, mild TR // Echo 5/19: EF 50-55, normal wall motion, grade 1 diastolic dysfunction, trivial TR  . Complication of anesthesia    SLOW TO AWAKEN AFTER LAST COLONSCOPY  . Coronary artery disease 05/09/2015   LHC 10/12: EF 55, OM1 80-90 - difficult to engage >> treated medically // Nuc 8/18: EF 40, no ischemia  . Depression   . DJD (degenerative joint disease)    OA  . Dysrhythmia    Not clear where this diagnosis came from  . Emphysema of lung ([place])    Dr. Chase Caller  . Fibromyalgia   . H/O: liver disease 90 YRS AGO   tranamanitis due to previous interferon - LFTs improved off of  . History of MI (myocardial infarction) FEW YRS AGO  . History of TIA (transient ischemic attack)   . Hx of colonic polyps   . Hyperlipidemia    intol to statin - myalgias // ESPERION trial - patient stopped  . Multiple sclerosis ([place])   . Narcotic dependence ([facility])    hx of benzodiazepine and narcotic  . Osteoporosis   . Positive H. pylori test   . Urinary incontinence     Medications: Outpatient Encounter Medications as of 09/06/2019  Medication Sig  . Ascorbic Acid (VITAMIN C) POWD Take 1,000 mg by mouth daily. Energizer packet  . aspirin EC 81 MG tablet Take 324 mg by mouth daily.   [name] CALCIUM PO Take 1 tablet by mouth daily.  . Cholecalciferol (VITAMIN D3) 125 MCG (5000 UT) CAPS Take 5,000 Units by mouth daily.   . clonazePAM (KLONOPIN) 1 MG tablet Take 1 mg by mouth 3 (three) times daily as needed for anxiety.   [name] COENZYME Q10 PO Take 1 capsule by mouth daily.   [name] doxepin (SINEQUAN) 25 MG capsule Take 1 capsule (25 mg total) by mouth at bedtime.  . fluticasone (FLONASE) 50 MCG/ACT nasal spray Place 2 sprays into  both nostrils daily as needed for rhinitis.  [name] gabapentin (NEURONTIN) 100 MG capsule Take 100 mg by mouth daily as needed (Pain).   [name] gabapentin (NEURONTIN) 300 MG capsule TAKE 1 CAPSULE BY MOUTH DAILY  . GLATOPA 20 MG/ML SOSY injection USE 1 INJECTION SUBCUTANEOUSLY ONCE A DAY (Patient taking differently: Inject 20 mg into the skin daily. )  . guaiFENesin (MUCINEX) 600 MG 12 hr tablet Take 600 mg by mouth 2 (two) times daily as needed for cough or to loosen phlegm.   . Homeopathic Products (SIMILASAN DRY EYE RELIEF OP) Place 1 drop into both eyes 2 (two) times daily.  . INCRUSE ELLIPTA 62.5 MCG/INH AEPB INHALE 1 PUFF INTO THE LUNGS ONCE DAILY  . ipratropium (ATROVENT) 0.02 % nebulizer solution Take 2.5 mLs (0.5 mg total) by nebulization 4 (four) times daily.  . isosorbide mononitrate (IMDUR) 60 MG 24 hr tablet TAKE 1 AND 1/2 TABLETS (90 MG) BY MOUTH ONCE DAILY  . losartan (COZAAR) 25 MG tablet TAKE 1 TABLET BY MOUTH TWICE (2) DAILY  . lovastatin (MEVACOR) 40 MG tablet Take 1 tablet (40 mg total) by mouth at bedtime.  . Melatonin 5 MG TABS Take 5 mg by mouth at bedtime.  . metoprolol succinate (TOPROL-XL) 25 MG 24 hr tablet Take 1 tablet (25 mg total) by mouth daily. (Patient taking differently: Take 12.5 mg by mouth 2 (two) times daily. )  . modafinil (PROVIGIL) 200 MG tablet TAKE 1 TABLET BY MOUTH TWICE A DAY  . Multiple Vitamin (  MULTIVITAMIN WITH MINERALS) TABS tablet Take 1 tablet by mouth daily.  . nitroGLYCERIN (NITROSTAT) 0.4 MG SL tablet Place 1 tablet (0.4 mg total) under the tongue every 5 (five) minutes as needed for chest pain.  Gean Birchwood ER 50 MG TB12 Take 50 mg by mouth every 12 (twelve) hours.   . Omega-3 Fatty Acids (OMEGA 3 PO) Take 1 capsule by mouth daily.  [name] omeprazole (PRILOSEC) 20 MG capsule Take 1 capsule (20 mg total) by mouth daily.  [name] oxyCODONE-acetaminophen (PERCOCET) 5-325 MG per tablet Take 1 tablet by mouth every 4 (four) hours as needed for moderate pain or severe  pain.   . polyethylene glycol powder (GLYCOLAX/MIRALAX) powder Take 17 g by mouth daily as needed for mild constipation or moderate constipation.   [name] PRISTIQ 50 MG 24 hr tablet Take 50 mg by mouth every evening.   [name] PROAIR HFA 108 (90 Base) MCG/ACT inhaler INHALE 2 PUFFS INTO THE LUNGS EVERY 6 HOURS AS NEEDED FOR WHEEZING OR SHORTNESS OF BREATH  . vitamin B-12 (CYANOCOBALAMIN) 1000 MCG tablet Take 1,500 mcg by mouth daily.  . diphenhydrAMINE (BENADRYL) 50 MG capsule Take 50 mg by mouth at bedtime as needed for sleep.   . [DISCONTINUED] benzonatate (TESSALON) 200 MG capsule Take 1 capsule (200 mg total) by mouth 3 (three) times daily as needed for cough. (Patient taking differently: Take 200 mg by mouth at bedtime. )   No facility-administered encounter medications on file as of 09/06/2019.    Allergies: Allergies  Allergen Reactions  . Contrast Media [Iodinated Diagnostic Agents] Rash and Other (See Comments)    Severe rash  . Crestor [Rosuvastatin] Hives and Other (See Comments)    welps  . Doxycycline   . Sulfonamide Derivatives Hives    As a child    Family History: Family History  Problem Relation Age of Onset  . Heart attack Mother 76  . Heart attack Father 47  . Cancer Brother     Social History: Social History   Socioeconomic History  . Marital status: Divorced    Spouse name: Not on file  . Number of children: 2  . Years of education: 12th  . Highest education level: Not on file  Occupational History  . Occupation: disability    Employer: RETIRED  Tobacco Use  . Smoking status: Former Smoker    Packs/day: 1.00    Years: 48.00    Pack years: 48.00    Types: Cigarettes    Quit date: 11/16/2009    Years since quitting: 9.8  . Smokeless tobacco: Never Used  Substance and Sexual Activity  . Alcohol use: No  . Drug use: No  . Sexual activity: Not on file  Other Topics Concern  . Not on file  Social History Narrative   Patient lives at home alone. -  [place], [place]   Caffeine Use: rarely   Retired - Used to be a Dance movement psychotherapist; owned a Company secretary   Married; 2 children; 1 granddaughter   Social Determinants of Radio broadcast assistant Strain:   . Difficulty of Paying Living Expenses:   Food Insecurity:   . Worried About Charity fundraiser in the Last Year:   . Arboriculturist in the Last Year:   Transportation Needs:   . Film/video editor (Medical):   [name] Lack of Transportation (Non-Medical):   Physical Activity:   . Days of Exercise per Week:   . Minutes of Exercise per Session:  Stress:   . Feeling of Stress :   Social Connections:   . Frequency of Communication with Friends and Family:   . Frequency of Social Gatherings with Friends and Family:   . Attends Religious Services:   . Active Member of Clubs or Organizations:   . Attends Archivist Meetings:   [name] Marital Status:   Intimate Partner Violence:   . Fear of Current or Ex-Partner:   . Emotionally Abused:   [name] Physically Abused:   . Sexually Abused:     Observations/Objective:   Blood pressure 117/80, pulse 88, height 5\' 1"  (1.549 m), weight 118 lb 14.4 oz (53.9 kg), SpO2 98 %. alert and oriented to person, place, and time. Attention span and concentration intact, recent and remote memory intact, fund of knowledge intact.  Speech fluent and not dysarthric, language intact.  CN II-XII intact. Bulk and tone normal, muscle strength 5/5 throughout.  Sensation to light touch  intact.  Deep tendon reflexes 2+ throughout.  Finger to nose testing intact.  Gait normal, Romberg negative.   Assessment and Plan:   Multiple sclerosis  1. Gabapentin 300mg  at bedtime for pain 2.  Provigil for daytime fatigue 3.  D3 2000 IU daily 4.  Follow up in 6 months  Dudley Major, DO

## 2019-09-06 ENCOUNTER — Encounter: Payer: Self-pay | Admitting: Neurology

## 2019-09-06 ENCOUNTER — Telehealth: Payer: Medicare Other | Admitting: Neurology

## 2019-09-06 ENCOUNTER — Other Ambulatory Visit: Payer: Self-pay | Admitting: Cardiology

## 2019-09-06 ENCOUNTER — Other Ambulatory Visit: Payer: Self-pay | Admitting: Neurology

## 2019-09-06 ENCOUNTER — Other Ambulatory Visit: Payer: Self-pay

## 2019-09-06 VITALS — BP 117/80 | HR 88 | Ht 61.0 in | Wt 118.9 lb

## 2019-09-06 DIAGNOSIS — G35 Multiple sclerosis: Secondary | ICD-10-CM

## 2019-09-06 MED ORDER — MODAFINIL 200 MG PO TABS: 200.0000 mg | ORAL_TABLET | Freq: Two times a day (BID) | ORAL | 1 refills | Status: DC

## 2019-09-06 NOTE — Patient Instructions (Addendum)
Gabapentin 300mg  at bedtime Provigil refilled D3 2000 IU daily

## 2019-09-09 ENCOUNTER — Telehealth: Payer: Self-pay | Admitting: Cardiology

## 2019-09-09 NOTE — Telephone Encounter (Signed)
Reports being concerned about having stress test. Advised that she will be monitored through the entirety of  testing/procedure. Verbalized understanding and agrees to testing.

## 2019-09-09 NOTE — Telephone Encounter (Signed)
Please give pt a call- she has concerns about her stress test and a few other things, and she would like to speak with the nurse

## 2019-09-15 ENCOUNTER — Telehealth: Payer: Self-pay | Admitting: Cardiology

## 2019-09-15 NOTE — Telephone Encounter (Signed)
Pt is holding fluid in her hands and arms and feet in the mornings   Please call [phone]

## 2019-09-15 NOTE — Telephone Encounter (Signed)
Returned pt call. She stated that lately she has been swelling in her hands, feet and legs. She is not on a diuretic. She stated her current weight is 116 lbs, and  is not having any SOB. Her blood pressure has been normal per pt. At around 140/74, 143/70 She is aware she has an appointment with Dr. Harl Bowie on 4/20 and she will keep a weight log and bring it with her to appointment,

## 2019-09-21 ENCOUNTER — Encounter: Payer: Self-pay | Admitting: Cardiology

## 2019-09-21 ENCOUNTER — Ambulatory Visit: Payer: Medicare Other | Admitting: Cardiology

## 2019-09-21 ENCOUNTER — Telehealth: Payer: Self-pay | Admitting: Cardiology

## 2019-09-21 VITALS — BP 128/88 | HR 94 | Temp 98.8°F | Ht 61.0 in | Wt 120.0 lb

## 2019-09-21 DIAGNOSIS — R600 Localized edema: Secondary | ICD-10-CM

## 2019-09-21 DIAGNOSIS — I25119 Atherosclerotic heart disease of native coronary artery with unspecified angina pectoris: Secondary | ICD-10-CM

## 2019-09-21 DIAGNOSIS — R0789 Other chest pain: Secondary | ICD-10-CM | POA: Diagnosis not present

## 2019-09-21 MED ORDER — FUROSEMIDE 20 MG PO TABS: ORAL_TABLET | ORAL | 1 refills | Status: DC

## 2019-09-21 MED ORDER — ISOSORBIDE MONONITRATE ER 120 MG PO TB24: 120.0000 mg | ORAL_TABLET | Freq: Every day | ORAL | 3 refills | Status: DC

## 2019-09-21 NOTE — Patient Instructions (Signed)
Medication Instructions:  Start lasix 20 mg - daily as needed for swelling   Increase isosorbide to 120 mg once daily   Labwork: none  Testing/Procedures: none  Follow-Up: Your physician recommends that you schedule a follow-up appointment in: June 2021    Any Other Special Instructions Will Be Listed Below (If Applicable).     If you need a refill on your cardiac medications before your next appointment, please call your pharmacy.

## 2019-09-21 NOTE — Telephone Encounter (Signed)
Dr. Glenford Peers is going to be taking one single tooth from the pt and would like to speak w/ Dr. Harl Bowie before he does this.  Please call [phone]  Also, Dr. Glenford Peers would like a surgical clearance faxed to the office @ [phone]

## 2019-09-21 NOTE — Telephone Encounter (Signed)
Will forward to Dr. Branch to advise.  

## 2019-09-21 NOTE — Progress Notes (Signed)
Clinical Summary Ms. Strahm is a 74 y.o.female seen today for follow up of the following medical problems. This is a focused visit on her history of CAD and recent chest pain.    1. CAD  10/2017 cath: LM patent, LAD prox 40%, OM1 60%, patent RCA. Mean PA 17, PCWP 15, CI 2.9  Overall mild to moderate nonobstructive disease stable from 2012 cath - 10/2017 LVEF 50-55%, no WMAs, grade I diastolic dysfunction - 10/6431 Dr Einar Gip nuclear stress: no clear ischemia - 2018 Dr Einar Gip echo: LVEF 35-40% - 02/2018 48 hr holter [facility]: benign atrial and ventricular ectopy  - pulm notes history of pleuritic pain -compliant with meds.  - rare chest pains at times, will use SL Ng with improvement.   Toprol stopped she reports due to fatiguein the past, though unclear if was clearly related   - episode 1 week ago. Midchest pressure, 9/10. Does not recall other symptoms. Took NG x5, not improving. Pain lasted 15-20 minutes. Not positional. Isolated episode  - has not gone for stress test since our last visit. Reports recent deaths in her family at the hospital and does not want to spend any time in the hospital even for a test.  - still with chest pains at times -    2. Edema 10/2017 echo LVEF 50-55%, grade I DDx - intermittent LE edema at times.    SH: Josph Macho is her brother, also a patient of mine.   Past Medical History:  Diagnosis Date  . Anxiety   . Aortic atherosclerosis ([place]) 09/29/2017   High Res CT in 9/18: aortic atherosclerosis; coronary artery calcification  . Cervical disc disease    BULGING  . Chronic combined systolic and diastolic CHF (congestive heart failure) ([place]) 09/29/2017   Echo 7/18: Moderate concentric LVH, grade 1 diastolic dysfunction, abnormal septal wall motion due to LBBB, moderate diffuse HK, EF 35-40, atrial septal aneurysm with possible PFO, mild TR // Echo 5/19: EF 50-55, normal wall motion, grade 1 diastolic dysfunction, trivial TR  .  Complication of anesthesia    SLOW TO AWAKEN AFTER LAST COLONSCOPY  . Coronary artery disease 05/09/2015   LHC 10/12: EF 55, OM1 80-90 - difficult to engage >> treated medically // Nuc 8/18: EF 40, no ischemia  . Depression   . DJD (degenerative joint disease)    OA  . Dysrhythmia    Not clear where this diagnosis came from  . Emphysema of lung ([place])    Dr. Chase Caller  . Fibromyalgia   . H/O: liver disease 46 YRS AGO   tranamanitis due to previous interferon - LFTs improved off of  . History of MI (myocardial infarction) FEW YRS AGO  . History of TIA (transient ischemic attack)   . Hx of colonic polyps   . Hyperlipidemia    intol to statin - myalgias // ESPERION trial - patient stopped  . Multiple sclerosis ([place] )   . Narcotic dependence (HCC)    hx of benzodiazepine and narcotic  . Osteoporosis   . Positive H. pylori test   . Urinary incontinence      Allergies  Allergen Reactions  . Contrast Media [Iodinated Diagnostic Agents] Rash and Other (See Comments)    Severe rash  . Crestor [Rosuvastatin] Hives and Other (See Comments)    welps  . Doxycycline   . Sulfonamide Derivatives Hives    As a child     Current Outpatient Medications  Medication Sig Dispense Refill  .  Ascorbic Acid (VITAMIN C) POWD Take 1,000 mg by mouth daily. Energizer packet    . aspirin EC 81 MG tablet Take 324 mg by mouth daily.     [name] CALCIUM PO Take 1 tablet by mouth daily.    . Cholecalciferol (VITAMIN D3) 125 MCG (5000 UT) CAPS Take 5,000 Units by mouth daily.     . clonazePAM (KLONOPIN) 1 MG tablet Take 1 mg by mouth 3 (three) times daily as needed for anxiety.     [name] COENZYME Q10 PO Take 1 capsule by mouth daily.     . diphenhydrAMINE (BENADRYL) 50 MG capsule Take 50 mg by mouth at bedtime as needed for sleep.     [name] doxepin (SINEQUAN) 25 MG capsule Take 1 capsule (25 mg total) by mouth at bedtime. 30 capsule 5  . fluticasone (FLONASE) 50 MCG/ACT nasal spray Place 2 sprays into both nostrils  daily as needed for rhinitis.    [name] gabapentin (NEURONTIN) 100 MG capsule Take 100 mg by mouth daily as needed (Pain).     [name] gabapentin (NEURONTIN) 300 MG capsule TAKE 1 CAPSULE BY MOUTH DAILY 180 capsule 1  . GLATOPA 20 MG/ML SOSY injection USE 1 INJECTION SUBCUTANEOUSLY ONCE A DAY (Patient taking differently: Inject 20 mg into the skin daily. ) 30 mL 3  . guaiFENesin (MUCINEX) 600 MG 12 hr tablet Take 600 mg by mouth 2 (two) times daily as needed for cough or to loosen phlegm.     . Homeopathic Products (SIMILASAN DRY EYE RELIEF OP) Place 1 drop into both eyes 2 (two) times daily.    . INCRUSE ELLIPTA 62.5 MCG/INH AEPB INHALE 1 PUFF INTO THE LUNGS ONCE DAILY 30 each 4  . ipratropium (ATROVENT) 0.02 % nebulizer solution Take 2.5 mLs (0.5 mg total) by nebulization 4 (four) times daily. 120 mL 12  . isosorbide mononitrate (IMDUR) 60 MG 24 hr tablet TAKE 1 AND 1/2 TABLETS (90 MG) BY MOUTH ONCE DAILY 135 tablet 3  . losartan (COZAAR) 25 MG tablet TAKE 1 TABLET BY MOUTH TWICE (2) DAILY 180 tablet 1  . lovastatin (MEVACOR) 40 MG tablet Take 1 tablet (40 mg total) by mouth at bedtime. 90 tablet 3  . Melatonin 5 MG TABS Take 5 mg by mouth at bedtime.    . metoprolol succinate (TOPROL-XL) 25 MG 24 hr tablet Take 0.5 tablets (12.5 mg total) by mouth 2 (two) times daily. 90 tablet 1  . modafinil (PROVIGIL) 200 MG tablet Take 1 tablet (200 mg total) by mouth 2 (two) times daily. 180 tablet 1  . modafinil (PROVIGIL) 200 MG tablet TAKE 1 TABLET BY MOUTH TWICE A DAY 60 tablet 1  . Multiple Vitamin (MULTIVITAMIN WITH MINERALS) TABS tablet Take 1 tablet by mouth daily.    . nitroGLYCERIN (NITROSTAT) 0.4 MG SL tablet Place 1 tablet (0.4 mg total) under the tongue every 5 (five) minutes as needed for chest pain. 25 tablet 3  . NUCYNTA ER 50 MG TB12 Take 50 mg by mouth every 12 (twelve) hours.     . Omega-3 Fatty Acids (OMEGA 3 PO) Take 1 capsule by mouth daily.    [name] omeprazole (PRILOSEC) 20 MG capsule Take 1 capsule  (20 mg total) by mouth daily. 30 capsule 3  . oxyCODONE-acetaminophen (PERCOCET) 5-325 MG per tablet Take 1 tablet by mouth every 4 (four) hours as needed for moderate pain or severe pain.     . polyethylene glycol powder (GLYCOLAX/MIRALAX) powder Take 17 g by mouth daily  as needed for mild constipation or moderate constipation.     [name] PRISTIQ 50 MG 24 hr tablet Take 50 mg by mouth every evening.     [name] PROAIR HFA 108 (90 Base) MCG/ACT inhaler INHALE 2 PUFFS INTO THE LUNGS EVERY 6 HOURS AS NEEDED FOR WHEEZING OR SHORTNESS OF BREATH 8.5 g 3  . vitamin B-12 (CYANOCOBALAMIN) 1000 MCG tablet Take 1,500 mcg by mouth daily.     No current facility-administered medications for this visit.     Past Surgical History:  Procedure Laterality Date  . BACK SURGERY  11-2009   LOWER BACK  . BOTOX INJECTION N/A 03/21/2016   Procedure: BOTOX INJECTION;  Surgeon: Wilford Corner, MD;  Location: [facility];  Service: Endoscopy;  Laterality: N/A;  . CARDIAC CATHETERIZATION     UNSUCCESSFUL STENT PLACEMENT  . COLONSCOPY    . ESOPHAGEAL MANOMETRY N/A 10/23/2015   Procedure: ESOPHAGEAL MANOMETRY (EM);  Surgeon: Wilford Corner, MD;  Location: [facility];  Service: Endoscopy;  Laterality: N/A;  . ESOPHAGOGASTRODUODENOSCOPY (EGD) WITH PROPOFOL N/A 03/21/2016   Procedure: ESOPHAGOGASTRODUODENOSCOPY (EGD) WITH PROPOFOL;  Surgeon: Wilford Corner, MD;  Location: [facility];  Service: Endoscopy;  Laterality: N/A;  . RIGHT/LEFT HEART CATH AND CORONARY ANGIOGRAPHY N/A 10/02/2017   Procedure: RIGHT/LEFT HEART CATH AND CORONARY ANGIOGRAPHY;  Surgeon: Nelva Bush, MD;  Location: [facility];  Service: Cardiovascular;  Laterality: N/A;  . TUBAL LIGATION    . VIDEO BRONCHOSCOPY Bilateral 05/18/2019   Procedure: VIDEO BRONCHOSCOPY WITHOUT FLUORO;  Surgeon: Brand Males, MD;  Location: [facility] ENDOSCOPY;  Service: Endoscopy;  Laterality: Bilateral;     Allergies  Allergen Reactions  . Contrast Media  [Iodinated Diagnostic Agents] Rash and Other (See Comments)    Severe rash  . Crestor [Rosuvastatin] Hives and Other (See Comments)    welps  . Doxycycline   . Sulfonamide Derivatives Hives    As a child      Family History  Problem Relation Age of Onset  . Heart attack Mother 19  . Heart attack Father 94  . Cancer Brother      Social History Ms. Schaad reports that she quit smoking about 9 years ago. Her smoking use included cigarettes. She has a 48.00 pack-year smoking history. She has never used smokeless tobacco. Ms. Perras reports no history of alcohol use.   Review of Systems CONSTITUTIONAL: No weight loss, fever, chills, weakness or fatigue.  HEENT: Eyes: No visual loss, blurred vision, double vision or yellow sclerae.No hearing loss, sneezing, congestion, runny nose or sore throat.  SKIN: No rash or itching.  CARDIOVASCULAR: per hpi RESPIRATORY: No shortness of breath, cough or sputum.  GASTROINTESTINAL: No anorexia, nausea, vomiting or diarrhea. No abdominal pain or blood.  GENITOURINARY: No burning on urination, no polyuria NEUROLOGICAL: No headache, dizziness, syncope, paralysis, ataxia, numbness or tingling in the extremities. No change in bowel or bladder control.  MUSCULOSKELETAL: No muscle, back pain, joint pain or stiffness.  LYMPHATICS: No enlarged nodes. No history of splenectomy.  PSYCHIATRIC: No history of depression or anxiety.  ENDOCRINOLOGIC: No reports of sweating, cold or heat intolerance. No polyuria or polydipsia.  [name]   Physical Examination Today's Vitals   09/21/19 1111  BP: 128/88  Pulse: 94  Temp: 98.8 F (37.1 C)  SpO2: 98%  Weight: 120 lb (54.4 kg)  Height: 5\' 1"  (1.549 m)   Body mass index is 22.67 kg/m.  Gen: resting comfortably, no acute distress HEENT: no scleral icterus, pupils equal round and reactive, no  palptable cervical adenopathy,  CV: RRR, no m/r/g, no jvd Resp: Clear to auscultation bilaterally GI: abdomen is soft,  non-tender, non-distended, normal bowel sounds, no hepatosplenomegaly MSK: extremities are warm, no edema.  Skin: warm, no rash Neuro:  no focal deficits Psych: appropriate affect   Diagnostic Studies  10/2017 echo Study Conclusions   - Left ventricle: The cavity size was normal. Systolic function was  normal. The estimated ejection fraction was in the range of 50%  to 55%. Wall motion was normal; there were no regional wall  motion abnormalities. There was an increased relative  contribution of atrial contraction to ventricular filling.  Doppler parameters are consistent with abnormal left ventricular  relaxation (grade 1 diastolic dysfunction). Doppler parameters  are consistent with high ventricular filling pressure.  - Ventricular septum: Septal motion showed paradox.  - Tricuspid valve: There was trivial regurgitation.   10/2017 LHC/RHC Conclusions: 1. Mild to moderate, non-obstructive coronary artery disease involving the proximal/mid LAD and OM1. Overall appearance is similar to prior catheterizations in 2012. 2. Low normal to mildly reduced left ventricular contraction (LVEF ~50%). 3. Upper normal left and right heart filling pressures. 4. Normal pulmonary artery pressure. 5. Mildly elevated transpulmonary valve gradient. 6. Normal Fick cardiac output/index.  Recommendations: 1. Continue medical therapy. As chest pain has improved with addition of isosorbide mononitrate, further uptitration should be considered in the future for relief of symptoms. 2. Risk factor modification and medical therapy to prevent progression of coronary artery disease.      Assessment and Plan   1. CAD -recent chest pain symptoms. She has had prior chest pains with negative stress testing in 2018 and cath in 2019 - we had ordered a nuclear stress last visit but she at this time does not want testing  we will increase her imdur to 120mg  daily, she will contact us if she  changes her mind  2. LE edema - start lasix 20mg  prn     Arnoldo Lenis, M.D.

## 2019-09-22 ENCOUNTER — Encounter (HOSPITAL_COMMUNITY): Payer: Medicare Other

## 2019-10-06 ENCOUNTER — Other Ambulatory Visit: Payer: Self-pay | Admitting: Internal Medicine

## 2019-11-08 ENCOUNTER — Other Ambulatory Visit: Payer: Self-pay | Admitting: Internal Medicine

## 2019-11-09 ENCOUNTER — Encounter: Payer: Self-pay | Admitting: Cardiology

## 2019-11-09 ENCOUNTER — Ambulatory Visit: Payer: Medicare Other | Admitting: Cardiology

## 2019-11-09 ENCOUNTER — Ambulatory Visit: Payer: Medicare Other | Admitting: Internal Medicine

## 2019-11-09 ENCOUNTER — Other Ambulatory Visit: Payer: Self-pay

## 2019-11-09 VITALS — BP 100/58 | HR 91 | Ht 61.0 in | Wt 120.4 lb

## 2019-11-09 DIAGNOSIS — I2510 Atherosclerotic heart disease of native coronary artery without angina pectoris: Secondary | ICD-10-CM | POA: Diagnosis not present

## 2019-11-09 MED ORDER — METOPROLOL SUCCINATE ER 25 MG PO TB24: 12.5000 mg | ORAL_TABLET | Freq: Two times a day (BID) | ORAL | 3 refills | Status: DC

## 2019-11-09 MED ORDER — ISOSORBIDE MONONITRATE ER 120 MG PO TB24: 120.0000 mg | ORAL_TABLET | Freq: Every day | ORAL | 3 refills | Status: DC

## 2019-11-09 MED ORDER — LOSARTAN POTASSIUM 25 MG PO TABS: ORAL_TABLET | ORAL | 3 refills | Status: DC

## 2019-11-09 MED ORDER — FUROSEMIDE 20 MG PO TABS: ORAL_TABLET | ORAL | 1 refills | Status: DC

## 2019-11-09 MED ORDER — LOVASTATIN 40 MG PO TABS: 40.0000 mg | ORAL_TABLET | Freq: Every day | ORAL | 3 refills | Status: DC

## 2019-11-09 NOTE — Patient Instructions (Addendum)
Medication Instructions:    Your physician recommends that you continue on your current medications as directed. Please refer to the Current Medication list given to you today.  Labwork:  NONE  Testing/Procedures:  NONE  Follow-Up:  Your physician recommends that you schedule a follow-up appointment in: 6 months (office)  Any Other Special Instructions Will Be Listed Below (If Applicable).  If you need a refill on your cardiac medications before your next appointment, please call your pharmacy. 

## 2019-11-09 NOTE — Progress Notes (Signed)
Clinical Summary Ms. Caraway is a 74 y.o.female seen today for follow up of the following medical problems. This is a focused visit for recent symptoms of chest pain.   1. CAD 10/2017 cath: LM patent, LAD prox 40%, OM1 60%, patent RCA. Mean PA 17, PCWP 15, CI 2.9  Overall mild to moderate nonobstructive disease stable from 2012 cath - 10/2017 LVEF 50-55%, no WMAs, grade I diastolic dysfunction - 06/6107 Dr Einar Gip nuclear stress: no clear ischemia - 2018 Dr Einar Gip echo: LVEF 35-40% - 02/2018 48 hr holter [facility]: benign atrial and ventricular ectopy  - pulm notes history of pleuritic pain -compliant with meds.  - rare chest pains at times, will use SL Ng with improvement.   Toprol stopped she reports due to fatiguein the past, though unclear if was clearly related   - episode 1 week ago. Midchest pressure, 9/10. Does not recall other symptoms. Took NG x5, not improving. Pain lasted 15-20 minutes. Not positional. Isolated episode  - has not gone for stress test since our last visit. Reports recent deaths in her family at the hospital and does not want to spend any time in the hospital even for a test.  - still with chest pains at times   - since last visit she reports increased stress, pain seems to be associated. Taking NG with some benefit.     SH: Josph Macho is her brother, also a patient of mine. Past Medical History:  Diagnosis Date  . Anxiety   . Aortic atherosclerosis ([place]) 09/29/2017   High Res CT in 9/18: aortic atherosclerosis; coronary artery calcification  . Cervical disc disease    BULGING  . Chronic combined systolic and diastolic CHF (congestive heart failure) ([place]) 09/29/2017   Echo 7/18: Moderate concentric LVH, grade 1 diastolic dysfunction, abnormal septal wall motion due to LBBB, moderate diffuse HK, EF 35-40, atrial septal aneurysm with possible PFO, mild TR // Echo 5/19: EF 50-55, normal wall motion, grade 1 diastolic dysfunction, trivial TR  .  Complication of anesthesia    SLOW TO AWAKEN AFTER LAST COLONSCOPY  . Coronary artery disease 05/09/2015   LHC 10/12: EF 55, OM1 80-90 - difficult to engage >> treated medically // Nuc 8/18: EF 40, no ischemia  . Depression   . DJD (degenerative joint disease)    OA  . Dysrhythmia    Not clear where this diagnosis came from  . Emphysema of lung ([place])    Dr. Chase Caller  . Fibromyalgia   . H/O: liver disease 65 YRS AGO   tranamanitis due to previous interferon - LFTs improved off of  . History of MI (myocardial infarction) FEW YRS AGO  . History of TIA (transient ischemic attack)   . Hx of colonic polyps   . Hyperlipidemia    intol to statin - myalgias // ESPERION trial - patient stopped  . Multiple sclerosis ([place])   . Narcotic dependence (HCC)    hx of benzodiazepine and narcotic  . Osteoporosis   . Positive H. pylori test   . Urinary incontinence      Allergies  Allergen Reactions  . Contrast Media [Iodinated Diagnostic Agents] Rash and Other (See Comments)    Severe rash  . Crestor [Rosuvastatin] Hives and Other (See Comments)    welps  . Doxycycline   . Sulfonamide Derivatives Hives    As a child     Current Outpatient Medications  Medication Sig Dispense Refill  . omeprazole (PRILOSEC) 20 MG capsule  TAKE 1 CAPSULE BY MOUTH ONCE DAILY 30 capsule 3  . PROAIR HFA 108 (90 Base) MCG/ACT inhaler INHALE 2 PUFFS INTO THE LUNGS EVERY 6 HOURS AS NEEDED FOR WHEEZING OR SHORTNESS OF BREATH 8.5 g 3  . Ascorbic Acid (VITAMIN C) POWD Take 1,000 mg by mouth daily. Energizer packet    . aspirin EC 81 MG tablet Take 324 mg by mouth daily.     [name] CALCIUM PO Take 1 tablet by mouth daily.    . Cholecalciferol (VITAMIN D3) 125 MCG (5000 UT) CAPS Take 5,000 Units by mouth daily.     . clonazePAM (KLONOPIN) 1 MG tablet Take 1 mg by mouth 3 (three) times daily as needed for anxiety.     [name] COENZYME Q10 PO Take 1 capsule by mouth daily.     . diphenhydrAMINE (BENADRYL) 50 MG capsule Take 50 mg  by mouth at bedtime as needed for sleep.     [name] doxepin (SINEQUAN) 25 MG capsule Take 1 capsule (25 mg total) by mouth at bedtime. 30 capsule 5  . fluticasone (FLONASE) 50 MCG/ACT nasal spray Place 2 sprays into both nostrils daily as needed for rhinitis.    . furosemide (LASIX) 20 MG tablet Take 20 mg (1 tablet) daily as needed for swelling. 90 tablet 1  . gabapentin (NEURONTIN) 100 MG capsule Take 100 mg by mouth daily as needed (Pain).     [name] gabapentin (NEURONTIN) 300 MG capsule TAKE 1 CAPSULE BY MOUTH DAILY 180 capsule 1  . GLATOPA 20 MG/ML SOSY injection USE 1 INJECTION SUBCUTANEOUSLY ONCE A DAY (Patient taking differently: Inject 20 mg into the skin daily. ) 30 mL 3  . guaiFENesin (MUCINEX) 600 MG 12 hr tablet Take 600 mg by mouth 2 (two) times daily as needed for cough or to loosen phlegm.     . Homeopathic Products (SIMILASAN DRY EYE RELIEF OP) Place 1 drop into both eyes 2 (two) times daily.    . INCRUSE ELLIPTA 62.5 MCG/INH AEPB INHALE 1 PUFF INTO THE LUNGS ONCE DAILY 30 each 4  . ipratropium (ATROVENT) 0.02 % nebulizer solution Take 2.5 mLs (0.5 mg total) by nebulization 4 (four) times daily. 120 mL 12  . isosorbide mononitrate (IMDUR) 120 MG 24 hr tablet Take 1 tablet (120 mg total) by mouth daily. 90 tablet 3  . losartan (COZAAR) 25 MG tablet TAKE 1 TABLET BY MOUTH TWICE (2) DAILY 180 tablet 1  . lovastatin (MEVACOR) 40 MG tablet Take 1 tablet (40 mg total) by mouth at bedtime. 90 tablet 3  . Melatonin 5 MG TABS Take 5 mg by mouth at bedtime.    . metoprolol succinate (TOPROL-XL) 25 MG 24 hr tablet Take 0.5 tablets (12.5 mg total) by mouth 2 (two) times daily. 90 tablet 1  . modafinil (PROVIGIL) 200 MG tablet Take 1 tablet (200 mg total) by mouth 2 (two) times daily. 180 tablet 1  . modafinil (PROVIGIL) 200 MG tablet TAKE 1 TABLET BY MOUTH TWICE A DAY 60 tablet 1  . Multiple Vitamin (MULTIVITAMIN WITH MINERALS) TABS tablet Take 1 tablet by mouth daily.    . nitroGLYCERIN (NITROSTAT)  0.4 MG SL tablet Place 1 tablet (0.4 mg total) under the tongue every 5 (five) minutes as needed for chest pain. 25 tablet 3  . NUCYNTA ER 50 MG TB12 Take 50 mg by mouth every 12 (twelve) hours.     . Omega-3 Fatty Acids (OMEGA 3 PO) Take 1 capsule by mouth daily.    [name]  oxyCODONE-acetaminophen (PERCOCET) 5-325 MG per tablet Take 1 tablet by mouth every 4 (four) hours as needed for moderate pain or severe pain.     . polyethylene glycol powder (GLYCOLAX/MIRALAX) powder Take 17 g by mouth daily as needed for mild constipation or moderate constipation.     [name] PRISTIQ 50 MG 24 hr tablet Take 50 mg by mouth every evening.     . vitamin B-12 (CYANOCOBALAMIN) 1000 MCG tablet Take 1,500 mcg by mouth daily.     No current facility-administered medications for this visit.     Past Surgical History:  Procedure Laterality Date  . BACK SURGERY  11-2009   LOWER BACK  . BOTOX INJECTION N/A 03/21/2016   Procedure: BOTOX INJECTION;  Surgeon: Wilford Corner, MD;  Location: [facility];  Service: Endoscopy;  Laterality: N/A;  . CARDIAC CATHETERIZATION     UNSUCCESSFUL STENT PLACEMENT  . COLONSCOPY    . ESOPHAGEAL MANOMETRY N/A 10/23/2015   Procedure: ESOPHAGEAL MANOMETRY (EM);  Surgeon: Wilford Corner, MD;  Location: [facility];  Service: Endoscopy;  Laterality: N/A;  . ESOPHAGOGASTRODUODENOSCOPY (EGD) WITH PROPOFOL N/A 03/21/2016   Procedure: ESOPHAGOGASTRODUODENOSCOPY (EGD) WITH PROPOFOL;  Surgeon: Wilford Corner, MD;  Location: [facility];  Service: Endoscopy;  Laterality: N/A;  . RIGHT/LEFT HEART CATH AND CORONARY ANGIOGRAPHY N/A 10/02/2017   Procedure: RIGHT/LEFT HEART CATH AND CORONARY ANGIOGRAPHY;  Surgeon: Nelva Bush, MD;  Location: [place] [facility];  Service: Cardiovascular;  Laterality: N/A;  . TUBAL LIGATION    . VIDEO BRONCHOSCOPY Bilateral 05/18/2019   Procedure: VIDEO BRONCHOSCOPY WITHOUT FLUORO;  Surgeon: Brand Males, MD;  Location: [facility] ENDOSCOPY;  Service: Endoscopy;   Laterality: Bilateral;     Allergies  Allergen Reactions  . Contrast Media [Iodinated Diagnostic Agents] Rash and Other (See Comments)    Severe rash  . Crestor [Rosuvastatin] Hives and Other (See Comments)    welps  . Doxycycline   . Sulfonamide Derivatives Hives    As a child      Family History  Problem Relation Age of Onset  . Heart attack Mother 2  . Heart attack Father 2  . Cancer Brother      Social History Ms. Middlesworth reports that she quit smoking about 9 years ago. Her smoking use included cigarettes. She has a 48.00 pack-year smoking history. She has never used smokeless tobacco. Ms. Lentz reports no history of alcohol use.   Review of Systems CONSTITUTIONAL: No weight loss, fever, chills, weakness or fatigue.  HEENT: Eyes: No visual loss, blurred vision, double vision or yellow sclerae.No hearing loss, sneezing, congestion, runny nose or sore throat.  SKIN: No rash or itching.  CARDIOVASCULAR: per hpi RESPIRATORY: No shortness of breath, cough or sputum.  GASTROINTESTINAL: No anorexia, nausea, vomiting or diarrhea. No abdominal pain or blood.  GENITOURINARY: No burning on urination, no polyuria NEUROLOGICAL: No headache, dizziness, syncope, paralysis, ataxia, numbness or tingling in the extremities. No change in bowel or bladder control.  MUSCULOSKELETAL: No muscle, back pain, joint pain or stiffness.  LYMPHATICS: No enlarged nodes. No history of splenectomy.  PSYCHIATRIC: No history of depression or anxiety.  ENDOCRINOLOGIC: No reports of sweating, cold or heat intolerance. No polyuria or polydipsia.  [name]   Physical Examination Today's Vitals   11/09/19 1333  BP: (!) 100/58  Pulse: 91  SpO2: 99%  Weight: 120 lb 6.4 oz (54.6 kg)  Height: 5\' 1"  (1.549 m)   Body mass index is 22.75 kg/m.  Gen: resting comfortably, no acute distress HEENT: no scleral  icterus, pupils equal round and reactive, no palptable cervical adenopathy,  CV: RRR, no m/r/g, no  jvd Resp: Clear to auscultation bilaterally GI: abdomen is soft, non-tender, non-distended, normal bowel sounds, no hepatosplenomegaly MSK: extremities are warm, no edema.  Skin: warm, no rash Neuro:  no focal deficits Psych: appropriate affect   Diagnostic Studies  10/2017 echo Study Conclusions   - Left ventricle: The cavity size was normal. Systolic function was  normal. The estimated ejection fraction was in the range of 50%  to 55%. Wall motion was normal; there were no regional wall  motion abnormalities. There was an increased relative  contribution of atrial contraction to ventricular filling.  Doppler parameters are consistent with abnormal left ventricular  relaxation (grade 1 diastolic dysfunction). Doppler parameters  are consistent with high ventricular filling pressure.  - Ventricular septum: Septal motion showed paradox.  - Tricuspid valve: There was trivial regurgitation.   10/2017 LHC/RHC Conclusions: 1. Mild to moderate, non-obstructive coronary artery disease involving the proximal/mid LAD and OM1. Overall appearance is similar to prior catheterizations in 2012. 2. Low normal to mildly reduced left ventricular contraction (LVEF ~50%). 3. Upper normal left and right heart filling pressures. 4. Normal pulmonary artery pressure. 5. Mildly elevated transpulmonary valve gradient. 6. Normal Fick cardiac output/index.  Recommendations: 1. Continue medical therapy. As chest pain has improved with addition of isosorbide mononitrate, further uptitration should be considered in the future for relief of symptoms. 2. Risk factor modification and medical therapy to prevent progression of coronary artery disease.   Assessment and Plan   1. CAD -recent chest pain symptoms. She has had prior chest pains with negative stress testing in 2018 and cath in 2019 -continues to refuse repeat stress testing. I have asked her to let us know if she changes her  mind    F/u 6 months   Arnoldo Lenis, M.D.

## 2019-11-10 ENCOUNTER — Other Ambulatory Visit: Payer: Self-pay | Admitting: *Deleted

## 2019-11-10 DIAGNOSIS — R079 Chest pain, unspecified: Secondary | ICD-10-CM

## 2019-11-10 DIAGNOSIS — I2510 Atherosclerotic heart disease of native coronary artery without angina pectoris: Secondary | ICD-10-CM

## 2019-11-11 ENCOUNTER — Other Ambulatory Visit: Payer: Self-pay | Admitting: Primary Care

## 2019-11-11 ENCOUNTER — Telehealth: Payer: Self-pay | Admitting: Internal Medicine

## 2019-11-11 NOTE — Telephone Encounter (Signed)
Spoke with pt. States that she would like to have a prescription for Nasonex. She has been using Flonase but feels like it's not working anymore.  Dr. Chase Caller - please advise. Thanks.

## 2019-11-12 NOTE — Telephone Encounter (Signed)
Okay for Nasonex 2 squirts into the nostril daily

## 2019-11-15 MED ORDER — MOMETASONE FUROATE 50 MCG/ACT NA SUSP: 2.0000 | Freq: Every day | NASAL | 5 refills | Status: DC

## 2019-11-15 NOTE — Telephone Encounter (Signed)
Spoke with patient. She is aware that the RX has been sent in.   Nothing further needed at time of call.

## 2019-11-22 ENCOUNTER — Telehealth: Payer: Self-pay | Admitting: Cardiology

## 2019-11-22 NOTE — Telephone Encounter (Signed)
Patient called and said she needed to talk to Dr. Harl Bowie about some issues she's been having. She would like for someone to call her about what she should do.SHE DOES NOT WANT TO [place]  [phone]

## 2019-11-26 ENCOUNTER — Telehealth: Payer: Self-pay | Admitting: Cardiology

## 2019-11-26 NOTE — Telephone Encounter (Signed)
Pre-cert Verification for the following procedure    LEXISCAN   DATE: 12/08/2019  LOCATION: [facility]

## 2019-12-03 ENCOUNTER — Other Ambulatory Visit: Payer: Self-pay | Admitting: Cardiology

## 2019-12-07 ENCOUNTER — Encounter: Payer: Self-pay | Admitting: Neurology

## 2019-12-07 NOTE — Progress Notes (Addendum)
Carmela Rima Key: [id number] - [place] Case ID: [id number] Need help? Call us at [phone] Outcome Approvedtoday Request Reference Number: [id number]. MODAFINIL TAB 200MG  is approved through 06/08/2020. Your patient may now fill this prescription and it will be covered. Drug Modafinil 200MG  tablets Form [employer] Medicare Part D Electronic Prior Authorization Form (2017 [place]CP[place])

## 2019-12-08 ENCOUNTER — Encounter (HOSPITAL_COMMUNITY): Payer: Medicare Other

## 2019-12-30 ENCOUNTER — Telehealth: Payer: Self-pay | Admitting: Internal Medicine

## 2019-12-30 MED ORDER — IPRATROPIUM BROMIDE 0.02 % IN SOLN: 0.5000 mg | Freq: Four times a day (QID) | RESPIRATORY_TRACT | 5 refills | Status: DC

## 2019-12-30 MED ORDER — IPRATROPIUM BROMIDE 0.02 % IN SOLN: 0.5000 mg | Freq: Four times a day (QID) | RESPIRATORY_TRACT | 12 refills | Status: DC

## 2019-12-30 NOTE — Addendum Note (Signed)
Addended by: Desmond Dike C on: 12/30/2019 02:31 PM   Modules accepted: Orders

## 2019-12-30 NOTE — Telephone Encounter (Addendum)
Spoke with pt. She is needing a refill on Ipratropium neb solution. She has been trying to get this from her DME but they aren't getting back with her. Pt wants this refilled at her local pharmacy, she is aware that she will be paying out of pocket for this. Rx has been sent in. Nothing further was needed.

## 2019-12-31 ENCOUNTER — Other Ambulatory Visit: Payer: Self-pay | Admitting: Internal Medicine

## 2020-01-05 ENCOUNTER — Telehealth: Payer: Self-pay | Admitting: Cardiology

## 2020-01-05 NOTE — Telephone Encounter (Signed)
Spoke with pt who states that she is to tired to have stress test done. Pt will leave Lexi scan on the schedule for now she states and call us back it she changes her mind.

## 2020-01-05 NOTE — Telephone Encounter (Signed)
Pt would like to discuss CATH.  Please call [phone]   Thanks renee

## 2020-01-12 ENCOUNTER — Other Ambulatory Visit: Payer: Self-pay | Admitting: Primary Care

## 2020-01-18 ENCOUNTER — Telehealth: Payer: Self-pay | Admitting: Cardiology

## 2020-01-18 NOTE — Telephone Encounter (Signed)
Does Pt need medicating before dental work.   Please call Tina/DeVeney [phone]    Thanks  renee (I have suggested D[facility] office to faxed request to obtain quicker response)

## 2020-01-18 NOTE — Telephone Encounter (Signed)
I will defer to Dr.Branch

## 2020-01-18 NOTE — Telephone Encounter (Signed)
No premeds needed  Zandra Abts MD

## 2020-01-18 NOTE — Telephone Encounter (Signed)
Dental office closed, will call in am

## 2020-01-19 NOTE — Telephone Encounter (Signed)
I spoke with [name] and relayed Dr.Branch's message that patient does not need pre-medication.

## 2020-01-28 ENCOUNTER — Other Ambulatory Visit: Payer: Self-pay | Admitting: Internal Medicine

## 2020-01-28 ENCOUNTER — Other Ambulatory Visit: Payer: Self-pay

## 2020-01-28 ENCOUNTER — Other Ambulatory Visit: Payer: Self-pay | Admitting: Neurology

## 2020-01-28 MED ORDER — MODAFINIL 200 MG PO TABS: 200.0000 mg | ORAL_TABLET | Freq: Two times a day (BID) | ORAL | 1 refills | Status: DC

## 2020-01-31 ENCOUNTER — Encounter (HOSPITAL_COMMUNITY)
Admission: RE | Admit: 2020-01-31 | Discharge: 2020-01-31 | Disposition: A | Discharge status: Home / Self Care | Payer: Medicare Other | Source: Ambulatory Visit | Attending: Dermatology | Admitting: Dermatology

## 2020-01-31 ENCOUNTER — Other Ambulatory Visit: Payer: Self-pay

## 2020-01-31 ENCOUNTER — Encounter (HOSPITAL_BASED_OUTPATIENT_CLINIC_OR_DEPARTMENT_OTHER)
Admission: RE | Admit: 2020-01-31 | Discharge: 2020-01-31 | Disposition: A | Discharge status: Home / Self Care | Payer: Medicare Other | Source: Ambulatory Visit | Attending: Cardiology | Admitting: Cardiology

## 2020-01-31 DIAGNOSIS — R079 Chest pain, unspecified: Secondary | ICD-10-CM | POA: Diagnosis not present

## 2020-01-31 DIAGNOSIS — I2510 Atherosclerotic heart disease of native coronary artery without angina pectoris: Secondary | ICD-10-CM | POA: Diagnosis present

## 2020-01-31 LAB — NM MYOCAR MULTI W/SPECT W/WALL MOTION / EF
LV dias vol: 78 mL (ref 46–106)
LV sys vol: 34 mL
Peak HR: 85 {beats}/min
RATE: 0.47
Rest HR: 73 {beats}/min
SDS: 8
SRS: 3
SSS: 11
TID: 1.2

## 2020-01-31 MED ORDER — TECHNETIUM TC 99M TETROFOSMIN IV KIT
10.0000 | PACK | Freq: Once | INTRAVENOUS | Status: AC | PRN
  Administered 2020-01-31: 9.19 via INTRAVENOUS

## 2020-01-31 MED ORDER — REGADENOSON 0.4 MG/5ML IV SOLN
INTRAVENOUS | Status: AC
  Administered 2020-01-31: 0.4 mg via INTRAVENOUS
  Filled 2020-01-31: qty 5

## 2020-01-31 MED ORDER — SODIUM CHLORIDE FLUSH 0.9 % IV SOLN
INTRAVENOUS | Status: AC
  Administered 2020-01-31: 10 mL via INTRAVENOUS
  Filled 2020-01-31: qty 10

## 2020-01-31 MED ORDER — TECHNETIUM TC 99M TETROFOSMIN IV KIT
30.0000 | PACK | Freq: Once | INTRAVENOUS | Status: AC | PRN
  Administered 2020-01-31: 28.9 via INTRAVENOUS

## 2020-02-01 ENCOUNTER — Telehealth: Payer: Self-pay | Admitting: Cardiology

## 2020-02-01 NOTE — Telephone Encounter (Signed)
Pt aware that stress test hasn't been resulted on was just done yesterday - pt requested to come to discuss results with Dr Harl Bowie in person - scheduled 10/15 with Dr Harl Bowie

## 2020-02-01 NOTE — Telephone Encounter (Signed)
Patient called in regards to recent test results. She stated that she feels she needs to talk to Dr. Harl Bowie in person about her results.

## 2020-02-08 ENCOUNTER — Telehealth: Payer: Self-pay | Admitting: *Deleted

## 2020-02-08 NOTE — Telephone Encounter (Signed)
-----   Message from Massie Maroon, [place] sent at 02/08/2020  8:53 AM EDT -----  ----- Message ----- From: Arnoldo Lenis, MD Sent: 02/03/2020  11:24 AM EDT To: Marikay Alar Pinnix, LPN  Overall low risk stress test, perhaps very small area of blockage at the tip of the heart affecting a very small area of the heart that would not be of significant risk. How are her symptoms doing?   Zandra Abts MD

## 2020-02-08 NOTE — Telephone Encounter (Signed)
Pt voiced understanding and says she is feeling fine - has been taking Toprol XL 12.5 mg tid instead of bid for the last week or so and thinks this has helped with symptoms

## 2020-02-15 ENCOUNTER — Telehealth: Payer: Self-pay | Admitting: Cardiology

## 2020-02-15 NOTE — Telephone Encounter (Signed)
New message    Patient is going to have 11 upper teeth removed, does she need to come in for a check up before having it done , does she need clearance?

## 2020-02-15 NOTE — Telephone Encounter (Signed)
I will forward to Dr.Branch

## 2020-02-15 NOTE — Telephone Encounter (Signed)
[place] for dental surgery   Zandra Abts MD

## 2020-02-15 NOTE — Telephone Encounter (Signed)
I relayed Dr.Branch's message to patient.

## 2020-02-16 ENCOUNTER — Encounter: Payer: Self-pay | Admitting: Internal Medicine

## 2020-02-16 ENCOUNTER — Ambulatory Visit: Payer: Medicare Other | Admitting: Internal Medicine

## 2020-02-16 ENCOUNTER — Other Ambulatory Visit: Payer: Self-pay

## 2020-02-16 VITALS — BP 140/70 | HR 94 | Temp 96.6°F | Ht 61.0 in | Wt 124.0 lb

## 2020-02-16 DIAGNOSIS — Z7189 Other specified counseling: Secondary | ICD-10-CM

## 2020-02-16 DIAGNOSIS — J438 Other emphysema: Secondary | ICD-10-CM

## 2020-02-16 DIAGNOSIS — R918 Other nonspecific abnormal finding of lung field: Secondary | ICD-10-CM | POA: Diagnosis not present

## 2020-02-16 DIAGNOSIS — Z7185 Encounter for immunization safety counseling: Secondary | ICD-10-CM
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
## 2020-02-16 NOTE — Patient Instructions (Addendum)
Pulmonary emphysema (HCC)  - stable  plan - Continue incruse daily scheduled and do nebulizer as needed  - compliance important   Pulmonary nodules - small left upper lobe and right lower lobe   CT scan feb 2021 shows stable left upper lobe nodule sinsce 2014 and improved RLL nodules after antibiotic doxy  Too bad  You had side effects with doxy   Plan - list doxy as side effect  = repeat ct chest without contrast in 1 year from feb 2021 -> this will march /April 2022 -ok she has pulmonary nodules in February 2021.     Vaccine counseling  Plan  = get high dose flu shot when available  - definitely recommend covid vaccine (Moderna or Pfizer - both [place] companies)  - benefits of these vaccines are far superior to any fear mongering that you hear on TV and social media   Followup March/april 2022 -> after CT chest

## 2020-02-16 NOTE — Addendum Note (Signed)
Addended by: Vanessa Barbara on: 02/16/2020 02:12 PM   Modules accepted: Orders

## 2020-02-28 ENCOUNTER — Telehealth: Payer: Self-pay | Admitting: Internal Medicine

## 2020-02-28 DIAGNOSIS — R059 Cough, unspecified: Secondary | ICD-10-CM

## 2020-02-28 NOTE — Telephone Encounter (Signed)
ATC patient.  LMTCB. 

## 2020-02-28 NOTE — Telephone Encounter (Signed)
Patient needs COVID-19 testing-if she is positive she needs to call us back immediately  Sounds as if she has a COPD exacerbation-make sure that she is drinking plenty of fluid uses Tylenol if she is able to take as needed for fever. Z-Pak #1 take as directed Mucinex DM twice daily as needed for cough and congestion  If not improving or worsens needs to go to the emergency room  Please contact office for sooner follow up if symptoms do not improve or worsen or seek emergency care

## 2020-02-28 NOTE — Telephone Encounter (Signed)
Primary Pulmonologist: Dr. Chase Caller Last office visit and with whom: 02/16/20 Dr. Chase Caller What do we see them for (pulmonary problems): Emphysema, Nodules Last OV assessment/plan: See Below  Was appointment offered to patient (explain)?  No   Reason for call: Called and spoke patient she states she has a cold and productive cough with green sputum that started about a week ago. She states her highest temp has been 99.6. She has not been covid tested and does not have vaccines.   Tammy please advise   (examples of things to ask: : When did symptoms start? Fever? Cough? Productive? Color to sputum? More sputum than usual? Wheezing? Have you needed increased oxygen? Are you taking your respiratory medications? What over the counter measures have you tried?)  Allergies  Allergen Reactions  . Contrast Media [Iodinated Diagnostic Agents] Rash and Other (See Comments)    Severe rash  . Crestor [Rosuvastatin] Hives and Other (See Comments)    welps  . Doxycycline   . Sulfonamide Derivatives Hives    As a child    Immunization History  Administered Date(s) Administered  . Fluad Quad(high Dose 65+) 02/01/2019, 03/23/2019  . Influenza Split 02/20/2011, 02/20/2012, 03/03/2014  . Influenza Whole 02/17/2017  . Influenza, High Dose Seasonal PF 02/09/2018  . Influenza,inj,Quad PF,6+ Mos 02/19/2013, 02/01/2019  . Influenza-Unspecified 02/04/2017  . Pneumococcal Conjugate-13 02/05/2017  . Pneumococcal Polysaccharide-23 06/01/2012, 03/03/2014  . Td 06/03/2002  . Tdap 11/10/2012    Assessment:       ICD-10-CM   1. Other emphysema ([place])  J43.8   2. Pulmonary nodules  R91.8   3. Vaccine counseling  Z71.89        Plan:     Patient Instructions  Pulmonary emphysema ([place])  - stable  plan - Continue incruse daily scheduled and do nebulizer as needed             - compliance important   Pulmonary nodules - small left upper lobe and right lower lobe   CT scan feb 2021  shows stable left upper lobe nodule sinsce 2014 and improved RLL nodules after antibiotic doxy  Too bad  You had side effects with doxy   Plan - list doxy as side effect  = repeat ct chest without contrast in 1 year from feb 2021 -> this will march /April 2022   Vaccine counseling  Plan  = get high dose flu shot when available  - definitely recommend covid vaccine (Moderna or Pfizer - both [place] companies)             - benefits of these vaccines are far superior to any fear mongering that you hear on TV and social media   Followup March/april 2022 -> after CT chest

## 2020-02-29 MED ORDER — AZITHROMYCIN 250 MG PO TABS: ORAL_TABLET | ORAL | 0 refills | Status: DC

## 2020-02-29 NOTE — Telephone Encounter (Signed)
Patient returning call.  Pt has bad cough and congestion.  Pt calling back.  [phone]

## 2020-02-29 NOTE — Telephone Encounter (Signed)
Patient is returning phone call. Patient phone number is [phone].

## 2020-02-29 NOTE — Telephone Encounter (Signed)
Spoke with patient regarding prior message.Advised  Patient needs COVID-19 testing-if she is positive she needs to call us back immediately  Sounds as if she has a COPD exacerbation-make sure that she is drinking plenty of fluid uses Tylenol if she is able to take as needed for fever. Z-Pak #1 take as directed Mucinex DM twice daily as needed for cough and congestion  If not improving or worsens needs to go to the emergency room  Please contact office for sooner follow up if symptoms do not improve or worsen or seek emergency care    Patient is aware of script sent into pharmacy and voice was understanding nothing else further needed.

## 2020-03-01 ENCOUNTER — Other Ambulatory Visit: Payer: Medicare Other

## 2020-03-01 DIAGNOSIS — Z20822 Contact with and (suspected) exposure to covid-19: Secondary | ICD-10-CM

## 2020-03-02 LAB — NOVEL CORONAVIRUS, NAA: SARS-CoV-2, NAA: DETECTED — AB

## 2020-03-03 ENCOUNTER — Telehealth: Payer: Self-pay | Admitting: Infectious Diseases

## 2020-03-04 ENCOUNTER — Telehealth: Payer: Self-pay

## 2020-03-04 NOTE — Telephone Encounter (Signed)
Result note information: Pt called and was informed she tested positive with covid. Pt sounded breathless and SOB and very ill. Very difficult to make out what she is saying because she is slurring her words. Pt stated that "if I die, I die." Advised pt that she needs emergency care- pt was concerned about her husband who has cancer and who will take care of him. Pt gave me her sister Amedeo Plenty number [phone]- called 911 and informed dispatch of pt name, DOB and address then connected 911 to pt. Attempted to call Pamala Hurry twice "VM not set up"- called pt back-pt gave sister number "Jule" [phone] to call- called Jule and LM on VM. Called pt back and LM on VM to inform her I called her sister "Jule" and left message.

## 2020-03-06 ENCOUNTER — Telehealth: Payer: Self-pay | Admitting: Nurse Practitioner

## 2020-03-06 ENCOUNTER — Other Ambulatory Visit: Payer: Self-pay | Admitting: Neurology

## 2020-03-06 ENCOUNTER — Telehealth: Payer: Self-pay | Admitting: Internal Medicine

## 2020-03-06 ENCOUNTER — Telehealth: Payer: Self-pay | Admitting: Neurology

## 2020-03-06 DIAGNOSIS — U071 COVID-19: Secondary | ICD-10-CM

## 2020-03-06 MED ORDER — MODAFINIL 200 MG PO TABS
200.0000 mg | ORAL_TABLET | Freq: Two times a day (BID) | ORAL | 2 refills | Status: DC
Start: 2020-03-06 — End: 2020-06-20

## 2020-03-06 NOTE — Telephone Encounter (Signed)
Called to discuss with [name] about Covid symptoms and the use of regeneron, a monoclonal antibody infusion for those with mild to moderate Covid symptoms and at a high risk of hospitalization.    Pt does not qualify for infusion therapy given symptoms first presented > 10 days prior to timing of infusion. Symptoms tier reviewed as well as criteria for ending isolation. Preventative practices reviewed. Patient verbalized understanding  Patient also states that she is feeling better and all her symptoms are improving.    Patient Active Problem List   Diagnosis Date Noted  . Pulmonary infiltrates   . Chronic cough   . Rib pain 06/02/2018  . Abdominal bloating 06/02/2018  . Chronic combined systolic and diastolic CHF (congestive heart failure) ([place]) 09/29/2017  . Aortic atherosclerosis ([place]) 09/29/2017  . COPD (chronic obstructive pulmonary disease) ([place]) 09/29/2017  . Bibasilar crackles 10/17/2016  . Dysphagia 03/21/2016  . Hyperlipidemia 05/09/2015  . Coronary artery disease 05/09/2015  . Numbness and tingling of right arm 05/08/2015  . TIA (transient ischemic attack) 05/08/2015  . Attention deficit disorder 10/14/2014  . Nodule of left lung 06/13/2014  . Seasonal affective disorder ([place]) 06/13/2014  . Severe episode of recurrent major depressive disorder ([place]) 02/19/2013  . Attention deficit disorder with hyperactivity(314.01) 09/23/2012  . Attention deficit disorder without mention of hyperactivity 07/29/2012  . History of smoking 25-50 pack years 04/01/2011  . Grief reaction 04/01/2011  . Dyspnea 09/21/2010  . Pulmonary nodule, left 09/21/2010  . ANXIETY 12/09/2008  . POSTTRAUMATIC STRESS DISORDER 12/09/2008  . DEPRESSION 12/09/2008  . Multiple sclerosis ([place]) 12/09/2008  . CONSTIPATION 12/09/2008  . IRRITABLE BOWEL SYNDROME 12/09/2008  . [place] DISEASE 12/09/2008  . FIBROMYALGIA 12/09/2008  . OSTEOPOROSIS 12/09/2008  . Pleuritic chest pain 12/09/2008   . URINARY INCONTINENCE 12/09/2008  . ABDOMINAL PAIN, UPPER 12/09/2008  . PEPTIC ULCER DISEASE, HX OF 12/09/2008  . COLONIC POLYPS, HX OF 12/09/2008     Beckey Rutter, NP [phone]

## 2020-03-06 NOTE — Telephone Encounter (Signed)
Patient had to cancel her appointment for 03/07/20 due to her testing positive for Covid-19. She needs a refill of her Provigil sent to [facility].

## 2020-03-06 NOTE — Telephone Encounter (Signed)
Spoke with the pt and made aware of response per MR She absolutely refused the infusion and does not want any part of it  I advised to call back if she changes her mind bc there is only a short window

## 2020-03-06 NOTE — Telephone Encounter (Signed)
Tried to call patient with no answer , will try back tomorrow . Called home Hal Neer

## 2020-03-06 NOTE — Telephone Encounter (Signed)
Done

## 2020-03-06 NOTE — Telephone Encounter (Signed)
Spoke with the pt  She states that she tested pos for covid 03/01/20  She is having fatigue and wheezing  She has prod cough with white sputum  She is having some mild SOB  No fever, chills, body aches  She is asking for something to be called in  She is still using her incruse, albuterol and atrovent nebs  Please advise, thanks

## 2020-03-06 NOTE — Telephone Encounter (Signed)
Tammy please advise °

## 2020-03-06 NOTE — Telephone Encounter (Signed)
She is 74. She has copd.  She lives in [place] [phone]   Plan  - pls refer to the [facility] infusion cener ASAP. She has a narrow window to get infusion because today is Day 5  - her husband if has covid needs to be referred to  - pls let her know that the antibody infusion is not effective as the vaccine and wish she had not brought into the fake news against covid vaccine (see my discussion 02/16/20)     Immunization History  Administered Date(s) Administered  . Fluad Quad(high Dose 65+) 02/01/2019, 03/23/2019  . Influenza Split 02/20/2011, 02/20/2012, 03/03/2014  . Influenza Whole 02/17/2017  . Influenza, High Dose Seasonal PF 02/09/2018  . Influenza,inj,Quad PF,6+ Mos 02/19/2013, 02/01/2019  . Influenza-Unspecified 02/04/2017  . Pneumococcal Conjugate-13 02/05/2017  . Pneumococcal Polysaccharide-23 06/01/2012, 03/03/2014  . Td 06/03/2002  . Tdap 11/10/2012

## 2020-03-06 NOTE — Telephone Encounter (Signed)
Ok thanks and duly noted.I then called patient 4:40 PM on her cell [phone].I spoke to patient -> explained rationale for infusion and best probability to save her life and general wait list for infusion. She says the govt is trying to kill her. She says the current govt is not her favorite. She says she likes Trump. Explained to her that Trump got the antibody and got vaccine and they both were approved under Trump administration  Plan  - refer to infusion center - she has agreed to take their evaluation as long as [facility] is not today     SIGNATURE    Dr. Brand Males, M.D., F.C.C.P,  Pulmonary and Critical Care Medicine Staff Physician, [place] Director - Interstitial Lung Disease  Program  Pulmonary [place] [facility] [place], [place], [address]  Pager: [phone], If no answer  OR between  19:00-7:00h: page [phone] Telephone (clinical office): [phone] Telephone (research): [phone]  4:44 PM 03/06/2020

## 2020-03-07 ENCOUNTER — Ambulatory Visit: Payer: Medicare Other | Admitting: Neurology

## 2020-03-07 MED ORDER — PREDNISONE 20 MG PO TABS: 20.0000 mg | ORAL_TABLET | Freq: Every day | ORAL | 0 refills | Status: DC

## 2020-03-07 NOTE — Telephone Encounter (Signed)
Patient is >10 days , Sx started ~02/21/20  Outside window for MAB .   Has finished Zpack  Still has some cough and congestion . No hemotpysis.  Remains on INCRUSE daily .  Appetite is down. No n/v.d.  Begin Prednisone 20mg  daily for 5 days .  Mucinex DM Twice daily  As needed  Cough/congestion .  Fluids and rest  Spoke with patient and she is aware Advised if symptoms or not improving or worsen she is to seek emergency room care.  Please contact office for sooner follow up if symptoms do not improve or worsen or seek emergency care   Please make appointment in the office in 2 weeks for follow-up visit w/ Dr. Chase Caller or APP

## 2020-03-07 NOTE — Telephone Encounter (Signed)
LMTCB x1 for pt.  

## 2020-03-09 NOTE — Telephone Encounter (Signed)
Called and spoke to pt. Informed her of the recs per TP. Appt schedule with Derl Barrow, NP, on 10/19. Pt verbalized understanding and denied any further questions or concerns at this time.

## 2020-03-17 ENCOUNTER — Ambulatory Visit: Payer: Medicare Other | Admitting: Cardiology

## 2020-03-19 NOTE — Telephone Encounter (Signed)
Called to Discuss with patient about Covid symptoms and the use of the monoclonal antibody infusion for those with mild to moderate Covid symptoms and at a high risk of hospitalization.     Pt appears to qualify for this infusion due to co-morbid conditions and/or a member of an at-risk group in accordance with the FDA Emergency Use Authorization.    Unable to reach pt - lvm    Janene Madeira, MSN, NP-C [facility] for Infectious Disease [email or web address] Pager: [phone] Office: [place]: [phone]

## 2020-03-21 ENCOUNTER — Ambulatory Visit: Payer: Medicare Other | Admitting: Primary Care

## 2020-03-21 ENCOUNTER — Other Ambulatory Visit: Payer: Self-pay

## 2020-03-21 ENCOUNTER — Encounter: Payer: Self-pay | Admitting: Primary Care

## 2020-03-21 VITALS — BP 116/64 | HR 96 | Temp 97.9°F | Ht 61.0 in | Wt 118.0 lb

## 2020-03-21 DIAGNOSIS — Z8616 Personal history of COVID-19: Secondary | ICD-10-CM | POA: Diagnosis not present

## 2020-03-21 DIAGNOSIS — J438 Other emphysema: Secondary | ICD-10-CM | POA: Diagnosis not present

## 2020-03-21 DIAGNOSIS — R918 Other nonspecific abnormal finding of lung field: Secondary | ICD-10-CM

## 2020-03-21 DIAGNOSIS — Z23 Encounter for immunization: Secondary | ICD-10-CM | POA: Diagnosis not present

## 2020-03-21 MED ORDER — ANORO ELLIPTA 62.5-25 MCG/INH IN AEPB: 1.0000 | INHALATION_SPRAY | Freq: Every day | RESPIRATORY_TRACT | 0 refills | Status: DC

## 2020-03-21 NOTE — Progress Notes (Signed)
@Patient  ID: [name], female    DOB: [birth year], 74 y.o.   MRN: [id number]  Chief Complaint  Patient presents with  . Follow-up    Pt states she is slowly improving after being diagnosed with Covid. Pt states she is still weak and states her breathing is worse than her baseline.    Referring provider: Donald Prose, MD  HPI: 74 year old female, former smoker quit 2011(48 pack year hx). PMH COPD, emphysema, lung nodule, fibromyalgia, MS, heart failure. Patient of Dr. Chase Caller.   HRCT in 2018 showed 32mm LUL nodule unchanged consistent with benign etiology, mild emphysema. Needs repeat CT chest f/u pulmonary nodules in Dec 2020. Maintained on Incruse, compliance has been an issue in the past.   Previous [facility] pulmonary encounter: [name] 74 y.o. -presents for routine follow-up.  She is on Incruse.  She is doing well.  She is going to have her extensive dental extraction tomorrow.  Annual CT scan of the chest is due in winter/spring 2022.  For dental extraction.  Currently minimal symptom burden.  COPD CAT score is 3.  Reviewed her vaccination history.  She has had all her vaccines except the season's flu shot.  She has not had the Covid vaccine.  We discussed this.  She says that she is fearful of the [place] government agenda with vaccines.  She does not trust the vaccine process.  I explained to her that whether it is vaccine of all the medication she takes the all go through clinical trials process.  Almost all the medications are supported by private industry and approved by the FDA.  I told her that this is no different from any of her other medications.  She initially did not want to listen to my discussion about vaccines.  I did express to her that it is my duty is physician to discuss and I will respect her choices.  She wanted to ensure that the vaccine manufacturers [place] based companies/operations.  I explaned to her [place], [employer] and [employer] the [place] last A companies.   Explained the real world experience in randomized control trial experience with these vaccines.  She is more open to the idea of taking Covid vaccine.  I recommended Moderna or [place] in that order as her choices  03/21/2020 - Interim  Patient presents today for 2 week follow-up Covid-19. She tested positive for COVID-19 on 03/01/20, her symptoms started on 02/21/20. She was outside the window for MAB. She finished zpack, sent in RX for prednisone 20mg  x 5 days. She is feeling some better but has residual fatigue. She still does not have her taste back. Her breathing is worse since getting covid. She gets out of breath a lot quicker. Remains on Incruse for COPD. O2 98% RA. CT imagining in March showed some improvement in nodules right lower lobe. Due for repeat imaging in February 2022.    Allergies  Allergen Reactions  . Contrast Media [Iodinated Diagnostic Agents] Rash and Other (See Comments)    Severe rash  . Crestor [Rosuvastatin] Hives and Other (See Comments)    welps  . Doxycycline   . Sulfonamide Derivatives Hives    As a child    Immunization History  Administered Date(s) Administered  . Fluad Quad(high Dose 65+) 02/01/2019, 03/23/2019, 03/21/2020  . Influenza Split 02/20/2011, 02/20/2012, 03/03/2014  . Influenza Whole 02/17/2017  . Influenza, High Dose Seasonal PF 02/09/2018  . Influenza,inj,Quad PF,6+ Mos 02/19/2013, 02/01/2019  . Influenza-Unspecified 02/04/2017  .  Pneumococcal Conjugate-13 02/05/2017  . Pneumococcal Polysaccharide-23 06/01/2012, 03/03/2014  . Td 06/03/2002  . Tdap 11/10/2012    Past Medical History:  Diagnosis Date  . Anxiety   . Aortic atherosclerosis ([place]) 09/29/2017   High Res CT in 9/18: aortic atherosclerosis; coronary artery calcification  . Cervical disc disease    BULGING  . Chronic combined systolic and diastolic CHF (congestive heart failure) ([place]) 09/29/2017   Echo 7/18: Moderate concentric LVH, grade 1 diastolic dysfunction, abnormal  septal wall motion due to LBBB, moderate diffuse HK, EF 35-40, atrial septal aneurysm with possible PFO, mild TR // Echo 5/19: EF 50-55, normal wall motion, grade 1 diastolic dysfunction, trivial TR  . Complication of anesthesia    SLOW TO AWAKEN AFTER LAST COLONSCOPY  . Coronary artery disease 05/09/2015   LHC 10/12: EF 55, OM1 80-90 - difficult to engage >> treated medically // Nuc 8/18: EF 40, no ischemia  . Depression   . DJD (degenerative joint disease)    OA  . Dysrhythmia    Not clear where this diagnosis came from  . Emphysema of lung ([place])    Dr. Chase Caller  . Fibromyalgia   . H/O: liver disease 87 YRS AGO   tranamanitis due to previous interferon - LFTs improved off of  . History of MI (myocardial infarction) FEW YRS AGO  . History of TIA (transient ischemic attack)   . Hx of colonic polyps   . Hyperlipidemia    intol to statin - myalgias // ESPERION trial - patient stopped  . Multiple sclerosis ([place])   . Narcotic dependence ([facility])    hx of benzodiazepine and narcotic  . Osteoporosis   . Positive H. pylori test   . Urinary incontinence     Tobacco History: Social History   Tobacco Use  Smoking Status Former Smoker  . Packs/day: 1.00  . Years: 48.00  . Pack years: 48.00  . Types: Cigarettes  . Quit date: 11/16/2009  . Years since quitting: 10.3  Smokeless Tobacco Never Used   Counseling given: Not Answered   Outpatient Medications Prior to Visit  Medication Sig Dispense Refill  . albuterol (VENTOLIN HFA) 108 (90 Base) MCG/ACT inhaler INHALE 2 PUFFS INTO THE LUNGS EVERY 6 HOURS AS NEEDED FOR WHEEZING OR SHORTNESS OF BREATH 8.5 g 3  . Ascorbic Acid (VITAMIN C) POWD Take 1,000 mg by mouth daily. Energizer packet    . aspirin EC 81 MG tablet Take 324 mg by mouth daily.     [name] CALCIUM PO Take 1 tablet by mouth daily.    . Cholecalciferol (VITAMIN D3) 125 MCG (5000 UT) CAPS Take 5,000 Units by mouth daily.     . clonazePAM (KLONOPIN) 1 MG tablet Take 1 mg by mouth 3  (three) times daily as needed for anxiety.     [name] COENZYME Q10 PO Take 1 capsule by mouth daily.     . diphenhydrAMINE (BENADRYL) 50 MG capsule Take 50 mg by mouth at bedtime as needed for sleep.     [name] doxepin (SINEQUAN) 25 MG capsule Take 1 capsule (25 mg total) by mouth at bedtime. 30 capsule 5  . fluticasone (FLONASE) 50 MCG/ACT nasal spray Place 2 sprays into both nostrils daily as needed for rhinitis.    . furosemide (LASIX) 20 MG tablet Take 20 mg (1 tablet) daily as needed for swelling. 90 tablet 1  . gabapentin (NEURONTIN) 100 MG capsule Take 100 mg by mouth daily as needed (Pain).     [name]  gabapentin (NEURONTIN) 300 MG capsule TAKE 1 CAPSULE BY MOUTH DAILY 180 capsule 1  . GLATOPA 20 MG/ML SOSY injection USE 1 INJECTION SUBCUTANEOUSLY ONCE A DAY (Patient taking differently: Inject 20 mg into the skin daily. ) 30 mL 3  . guaiFENesin (MUCINEX) 600 MG 12 hr tablet Take 600 mg by mouth 2 (two) times daily as needed for cough or to loosen phlegm.     . Homeopathic Products (SIMILASAN DRY EYE RELIEF OP) Place 1 drop into both eyes 2 (two) times daily.    . INCRUSE ELLIPTA 62.5 MCG/INH AEPB INHALE 1 PUFF INTO THE LUNGS ONCE DAILY 30 each 4  . ipratropium (ATROVENT) 0.02 % nebulizer solution Take 2.5 mLs (0.5 mg total) by nebulization 4 (four) times daily. 120 mL 5  . isosorbide mononitrate (IMDUR) 120 MG 24 hr tablet Take 1 tablet (120 mg total) by mouth daily. 90 tablet 3  . losartan (COZAAR) 25 MG tablet TAKE 1 TABLET BY MOUTH TWICE (2) DAILY 180 tablet 3  . lovastatin (MEVACOR) 40 MG tablet Take 1 tablet (40 mg total) by mouth at bedtime. 90 tablet 3  . metoprolol succinate (TOPROL-XL) 25 MG 24 hr tablet Take 0.5 tablets (12.5 mg total) by mouth 2 (two) times daily. 90 tablet 3  . modafinil (PROVIGIL) 200 MG tablet Take 1 tablet (200 mg total) by mouth 2 (two) times daily. 60 tablet 2  . mometasone (NASONEX) 50 MCG/ACT nasal spray Place 2 sprays into the nose daily. 17 g 5  . Multiple Vitamin  (MULTIVITAMIN WITH MINERALS) TABS tablet Take 1 tablet by mouth daily.    . nitroGLYCERIN (NITROSTAT) 0.4 MG SL tablet DISSOLVE 1 TABLET UNDER TONGUE AS NEEDEDFOR CHEST PAIN. MAY REPEAT 5 MINUTES APART 3 TIMES IF NEEDED 25 tablet 3  . NUCYNTA ER 50 MG TB12 Take 50 mg by mouth every 12 (twelve) hours.     . Omega-3 Fatty Acids (OMEGA 3 PO) Take 1 capsule by mouth daily.    [name] omeprazole (PRILOSEC) 20 MG capsule TAKE 1 CAPSULE BY MOUTH ONCE DAILY 30 capsule 3  . oxyCODONE-acetaminophen (PERCOCET) 5-325 MG per tablet Take 1 tablet by mouth every 4 (four) hours as needed for moderate pain or severe pain.     . polyethylene glycol powder (GLYCOLAX/MIRALAX) powder Take 17 g by mouth daily as needed for mild constipation or moderate constipation.     [name] PRISTIQ 50 MG 24 hr tablet Take 50 mg by mouth every evening.     . vitamin B-12 (CYANOCOBALAMIN) 1000 MCG tablet Take 1,500 mcg by mouth daily.    [name] azithromycin (ZITHROMAX) 250 MG tablet Take 2 tablets first day and 1 tablet daily until finished . 6 tablet 0  . Melatonin 5 MG TABS Take 5 mg by mouth at bedtime.    . predniSONE (DELTASONE) 20 MG tablet Take 1 tablet (20 mg total) by mouth daily with breakfast. 5 tablet 0   No facility-administered medications prior to visit.   Review of Systems  Review of Systems  Constitutional: Positive for fatigue.  Respiratory: Positive for shortness of breath. Negative for cough, chest tightness and wheezing.    Physical Exam  BP 116/64 (BP Location: Left Arm, Cuff Size: Normal)   Pulse 96   Temp 97.9 F (36.6 C) (Other (Comment)) Comment (Src): wrist  Ht 5\' 1"  (1.549 m)   Wt 118 lb (53.5 kg)   SpO2 98%   BMI 22.30 kg/m  Physical Exam Constitutional:      Appearance:  Normal appearance.  HENT:     Head: Normocephalic and atraumatic.     Mouth/Throat:     Comments: Deferred d/t mask Cardiovascular:     Rate and Rhythm: Normal rate and regular rhythm.  Pulmonary:     Effort: Pulmonary effort is  normal.     Breath sounds: Normal breath sounds. No wheezing or rales.  Musculoskeletal:        General: Normal range of motion.  Skin:    General: Skin is warm and dry.  Neurological:     General: No focal deficit present.     Mental Status: She is alert and oriented to person, place, and time. Mental status is at baseline.  Psychiatric:        Mood and Affect: Mood normal.        Behavior: Behavior normal.        Thought Content: Thought content normal.        Judgment: Judgment normal.     Lab Results:  CBC    Component Value Date/Time   WBC 8.4 09/29/2017 1649   WBC 12.8 (H) 07/25/2015 1439   RBC 3.98 09/29/2017 1649   RBC 3.60 (L) 07/25/2015 1439   HGB 11.9 09/29/2017 1649   HCT 35.5 09/29/2017 1649   PLT 335 09/29/2017 1649   MCV 89 09/29/2017 1649   MCH 29.9 09/29/2017 1649   MCH 30.0 07/25/2015 1439   MCHC 33.5 09/29/2017 1649   MCHC 35.2 07/25/2015 1439   RDW 13.9 09/29/2017 1649   LYMPHSABS 1.8 05/08/2015 1441   MONOABS 0.5 05/08/2015 1441   EOSABS 0.4 05/08/2015 1441   BASOSABS 0.0 05/08/2015 1441    BMET    Component Value Date/Time   NA 130 (L) 06/02/2018 1049   NA 135 09/29/2017 1649   K 3.9 06/02/2018 1049   CL 94 (L) 06/02/2018 1049   CO2 29 06/02/2018 1049   GLUCOSE 83 06/02/2018 1049   BUN 11 06/02/2018 1049   BUN 12 09/29/2017 1649   CREATININE 0.83 06/02/2018 1049   CALCIUM 9.1 06/02/2018 1049   GFRNONAA 73 09/29/2017 1649   GFRAA 84 09/29/2017 1649    BNP No results found for: BNP  ProBNP    Component Value Date/Time   PROBNP 36.0 06/02/2018 1049    Imaging: No results found.   Assessment & Plan:   COPD (chronic obstructive pulmonary disease) ([place]) - Worsening dyspnea since covid-19 dx in September 2021  - Plan change Incruse to Anoro Ellipta (LABA/LAMA) - Received high dose influenza vaccine today    Pulmonary infiltrates - CT imagining in March showed some improvement in nodules right lower lobe - Due for repeat CT  chest in February 2022  History of COVID-19 - DX Covid-19 on 03/01/20. Not hospitalized, not candidate for MAB - She is not interested in vaccine, discussed at length with her. She feels her husband, who has bladder cancer, got much sicker after he was vaccinated.    Martyn Ehrich, NP 03/22/2020

## 2020-03-21 NOTE — Patient Instructions (Addendum)
Recommendations: - Stop Incruse  - Start Anoro one puff daily  (if this inhaler helps your breathing, please let us know and we will send in prescription) - Due for repeat CT chest in February 2022 (already ordered) - High dose influenza vaccine today    Follow-up: - FU in March with Dr. Chase Caller

## 2020-03-22 ENCOUNTER — Encounter: Payer: Self-pay | Admitting: Primary Care

## 2020-03-22 DIAGNOSIS — Z8616 Personal history of COVID-19: Secondary | ICD-10-CM | POA: Insufficient documentation

## 2020-03-22 NOTE — Assessment & Plan Note (Addendum)
-   Worsening dyspnea since covid-19 dx in September 2021  - Plan change Incruse to Anoro Ellipta (LABA/LAMA) - Received high dose influenza vaccine today

## 2020-03-22 NOTE — Assessment & Plan Note (Addendum)
-   DX Covid-19 on 03/01/20. Not hospitalized, not candidate for MAB - She is not interested in vaccine, discussed at length with her. She feels her husband, who has bladder cancer, got much sicker after he was vaccinated.

## 2020-03-22 NOTE — Assessment & Plan Note (Addendum)
-   CT imagining in March showed some improvement in nodules right lower lobe - Due for repeat CT chest in February 2022

## 2020-03-25 ENCOUNTER — Other Ambulatory Visit: Payer: Self-pay | Admitting: Cardiology

## 2020-03-29 ENCOUNTER — Telehealth: Payer: Self-pay | Admitting: Internal Medicine

## 2020-03-29 MED ORDER — ANORO ELLIPTA 62.5-25 MCG/INH IN AEPB: 1.0000 | INHALATION_SPRAY | Freq: Every day | RESPIRATORY_TRACT | 6 refills | Status: DC

## 2020-03-29 NOTE — Telephone Encounter (Signed)
Spoke to patient, who stated that Anoro works well for her. Rx for Anoro has been sent to preferred pharmacy. Nothing further needed.

## 2020-04-14 ENCOUNTER — Telehealth (INDEPENDENT_AMBULATORY_CARE_PROVIDER_SITE_OTHER): Payer: Medicare Other | Admitting: Cardiology

## 2020-04-14 ENCOUNTER — Encounter: Payer: Self-pay | Admitting: Cardiology

## 2020-04-14 VITALS — BP 129/69 | HR 79 | Ht 61.0 in | Wt 118.0 lb

## 2020-04-14 DIAGNOSIS — I2510 Atherosclerotic heart disease of native coronary artery without angina pectoris: Secondary | ICD-10-CM

## 2020-04-14 NOTE — Patient Instructions (Signed)

## 2020-04-14 NOTE — Progress Notes (Signed)
Virtual Visit via Telephone Note   This visit type was conducted due to national recommendations for restrictions regarding the COVID-19 Pandemic (e.g. social distancing) in an effort to limit this patient's exposure and mitigate transmission in our community.  Due to her co-morbid illnesses, this patient is at least at moderate risk for complications without adequate follow up.  This format is felt to be most appropriate for this patient at this time.  The patient did not have access to video technology/had technical difficulties with video requiring transitioning to audio format only (telephone).  All issues noted in this document were discussed and addressed.  No physical exam could be performed with this format.  Please refer to the patient's chart for her  consent to telehealth for [facility].    Date:  04/14/2020   ID:  [name], DOB 11/27/1945, MRN [id number] The patient was identified using 2 identifiers.  Patient Location: Home Provider Location: Office/Clinic  PCP:  Donald Prose, MD  Cardiologist:  Carlyle Dolly, MD  Electrophysiologist:  None   Evaluation Performed:  Follow-Up Visit  Chief Complaint:  Follow up visit  History of Present Illness:    [name] is a 74 y.o. female seen today for follow up of the following medical problems.    1. CAD 10/2017 cath: LM patent, LAD prox 40%, OM1 60%, patent RCA. Mean PA 17, PCWP 15, CI 2.9  Overall mild to moderate nonobstructive disease stable from 2012 cath - 10/2017 LVEF 50-55%, no WMAs, grade I diastolic dysfunction - 02/3234 Dr Einar Gip nuclear stress: no clear ischemia - 2018 Dr Einar Gip echo: LVEF 35-40% - 02/2018 48 hr holter [facility]: benign atrial and ventricular ectopy  - pulm notes history of pleuritic pain -compliant with meds.  - rare chest pains at times, will use SL Ng with improvement.   Toprol stopped she reports due to fatiguein the past, though unclear if was clearly related   - episode  1 week ago. Midchest pressure, 9/10. Does not recall other symptoms. Took NG x5, not improving. Pain lasted 15-20 minutes. Not positional. Isolated episode  - has not gone for stress test since our last visit. Reports recent deaths in her family at the hospital and does not want to spend any time in the hospital even for a test. - still with chest pains at times   - since last visit she reports increased stress, pain seems to be associated. Taking NG with some benefit.   01/2020 nuclear stress: septal infarct, small apical infarct with mild peri-infarct ischemia.  - no recent chest pain.     2. COVID + COVID + 03/01/20    3. HTN - she remains compliant with meds   4. Hyperlipidemia - compliant with lovastatin   5. Chronic LBBB  6. COPD - followed by pulmonary Dr Chase Caller    SH: Josph Macho is her brother, also a patient of mine.  The patient does not have symptoms concerning for COVID-19 infection (fever, chills, cough, or new shortness of breath).    Past Medical History:  Diagnosis Date  . Anxiety   . Aortic atherosclerosis ([place]) 09/29/2017   High Res CT in 9/18: aortic atherosclerosis; coronary artery calcification  . Cervical disc disease    BULGING  . Chronic combined systolic and diastolic CHF (congestive heart failure) ([place]) 09/29/2017   Echo 7/18: Moderate concentric LVH, grade 1 diastolic dysfunction, abnormal septal wall motion due to LBBB, moderate diffuse HK, EF 35-40, atrial septal aneurysm with  possible PFO, mild TR // Echo 5/19: EF 50-55, normal wall motion, grade 1 diastolic dysfunction, trivial TR  . Complication of anesthesia    SLOW TO AWAKEN AFTER LAST COLONSCOPY  . Coronary artery disease 05/09/2015   LHC 10/12: EF 55, OM1 80-90 - difficult to engage >> treated medically // Nuc 8/18: EF 40, no ischemia  . Depression   . DJD (degenerative joint disease)    OA  . Dysrhythmia    Not clear where this diagnosis came from  .  Emphysema of lung ([place])    Dr. Chase Caller  . Fibromyalgia   . H/O: liver disease 5 YRS AGO   tranamanitis due to previous interferon - LFTs improved off of  . History of MI (myocardial infarction) FEW YRS AGO  . History of TIA (transient ischemic attack)   . Hx of colonic polyps   . Hyperlipidemia    intol to statin - myalgias // ESPERION trial - patient stopped  . Multiple sclerosis ([place])   . Narcotic dependence ([facility])    hx of benzodiazepine and narcotic  . Osteoporosis   . Positive H. pylori test   . Urinary incontinence    Past Surgical History:  Procedure Laterality Date  . BACK SURGERY  11-2009   LOWER BACK  . BOTOX INJECTION N/A 03/21/2016   Procedure: BOTOX INJECTION;  Surgeon: Wilford Corner, MD;  Location: [facility];  Service: Endoscopy;  Laterality: N/A;  . CARDIAC CATHETERIZATION     UNSUCCESSFUL STENT PLACEMENT  . COLONSCOPY    . ESOPHAGEAL MANOMETRY N/A 10/23/2015   Procedure: ESOPHAGEAL MANOMETRY (EM);  Surgeon: Wilford Corner, MD;  Location: [facility];  Service: Endoscopy;  Laterality: N/A;  . ESOPHAGOGASTRODUODENOSCOPY (EGD) WITH PROPOFOL N/A 03/21/2016   Procedure: ESOPHAGOGASTRODUODENOSCOPY (EGD) WITH PROPOFOL;  Surgeon: Wilford Corner, MD;  Location: [facility];  Service: Endoscopy;  Laterality: N/A;  . RIGHT/LEFT HEART CATH AND CORONARY ANGIOGRAPHY N/A 10/02/2017   Procedure: RIGHT/LEFT HEART CATH AND CORONARY ANGIOGRAPHY;  Surgeon: Nelva Bush, MD;  Location: [place] [facility];  Service: Cardiovascular;  Laterality: N/A;  . TUBAL LIGATION    . VIDEO BRONCHOSCOPY Bilateral 05/18/2019   Procedure: VIDEO BRONCHOSCOPY WITHOUT FLUORO;  Surgeon: Brand Males, MD;  Location[facility] ENDOSCOPY;  Service: Endoscopy;  Laterality: Bilateral;     Current Meds  Medication Sig  . albuterol (VENTOLIN HFA) 108 (90 Base) MCG/ACT inhaler INHALE 2 PUFFS INTO THE LUNGS EVERY 6 HOURS AS NEEDED FOR WHEEZING OR SHORTNESS OF BREATH  . Ascorbic Acid (VITAMIN C)  POWD Take 1,000 mg by mouth daily. Energizer packet  . aspirin EC 81 MG tablet Take 324 mg by mouth daily.   [name] CALCIUM PO Take 1 tablet by mouth daily.  . Cholecalciferol (VITAMIN D3) 125 MCG (5000 UT) CAPS Take 5,000 Units by mouth daily.   . clonazePAM (KLONOPIN) 1 MG tablet Take 1 mg by mouth 3 (three) times daily as needed for anxiety.   [name] COENZYME Q10 PO Take 1 capsule by mouth daily.   . diphenhydrAMINE (BENADRYL) 50 MG capsule Take 50 mg by mouth at bedtime as needed for sleep.   [name] doxepin (SINEQUAN) 25 MG capsule Take 1 capsule (25 mg total) by mouth at bedtime.  . fluticasone (FLONASE) 50 MCG/ACT nasal spray Place 2 sprays into both nostrils daily as needed for rhinitis.  . furosemide (LASIX) 20 MG tablet Take 20 mg (1 tablet) daily as needed for swelling.  . gabapentin (NEURONTIN) 100 MG capsule Take 100 mg by mouth daily  as needed (Pain).   [name] gabapentin (NEURONTIN) 300 MG capsule TAKE 1 CAPSULE BY MOUTH DAILY  . GLATOPA 20 MG/ML SOSY injection USE 1 INJECTION SUBCUTANEOUSLY ONCE A DAY (Patient taking differently: Inject 20 mg into the skin daily. )  . guaiFENesin (MUCINEX) 600 MG 12 hr tablet Take 600 mg by mouth 2 (two) times daily as needed for cough or to loosen phlegm.   . Homeopathic Products (SIMILASAN DRY EYE RELIEF OP) Place 1 drop into both eyes 2 (two) times daily.  [name] ipratropium (ATROVENT) 0.02 % nebulizer solution Take 2.5 mLs (0.5 mg total) by nebulization 4 (four) times daily.  . isosorbide mononitrate (IMDUR) 120 MG 24 hr tablet Take 1 tablet (120 mg total) by mouth daily.  [name] losartan (COZAAR) 25 MG tablet TAKE 1 TABLET BY MOUTH TWICE (2) DAILY  . lovastatin (MEVACOR) 40 MG tablet Take 1 tablet (40 mg total) by mouth at bedtime.  . metoprolol succinate (TOPROL-XL) 25 MG 24 hr tablet Take 0.5 tablets (12.5 mg total) by mouth 2 (two) times daily.  . modafinil (PROVIGIL) 200 MG tablet Take 1 tablet (200 mg total) by mouth 2 (two) times daily.  . mometasone (NASONEX) 50  MCG/ACT nasal spray Place 2 sprays into the nose daily.  . Multiple Vitamin (MULTIVITAMIN WITH MINERALS) TABS tablet Take 1 tablet by mouth daily.  . nitroGLYCERIN (NITROSTAT) 0.4 MG SL tablet DISSOLVE 1 TABLET UNDER TONGUE AS NEEDEDFOR CHEST PAIN. MAY REPEAT 5 MINUTES APART 3 TIMES IF NEEDED  . NUCYNTA ER 50 MG TB12 Take 50 mg by mouth every 12 (twelve) hours.   . Omega-3 Fatty Acids (OMEGA 3 PO) Take 1 capsule by mouth daily.  [name] omeprazole (PRILOSEC) 20 MG capsule TAKE 1 CAPSULE BY MOUTH ONCE DAILY  . oxyCODONE-acetaminophen (PERCOCET) 5-325 MG per tablet Take 1 tablet by mouth every 4 (four) hours as needed for moderate pain or severe pain.   . polyethylene glycol powder (GLYCOLAX/MIRALAX) powder Take 17 g by mouth daily as needed for mild constipation or moderate constipation.   [name] PRISTIQ 50 MG 24 hr tablet Take 50 mg by mouth every evening.   . umeclidinium-vilanterol (ANORO ELLIPTA) 62.5-25 MCG/INH AEPB Inhale 1 puff into the lungs daily.  . vitamin B-12 (CYANOCOBALAMIN) 1000 MCG tablet Take 1,500 mcg by mouth daily.  . [DISCONTINUED] umeclidinium-vilanterol (ANORO ELLIPTA) 62.5-25 MCG/INH AEPB Inhale 1 puff into the lungs daily.     Allergies:   Contrast media [iodinated diagnostic agents], Crestor [rosuvastatin], Doxycycline, and Sulfonamide derivatives   Social History   Tobacco Use  . Smoking status: Former Smoker    Packs/day: 1.00    Years: 48.00    Pack years: 48.00    Types: Cigarettes    Quit date: 11/16/2009    Years since quitting: 10.4  . Smokeless tobacco: Never Used  Substance Use Topics  . Alcohol use: No  . Drug use: No     Family Hx: The patient's family history includes Cancer in her brother; Heart attack (age of onset: 62) in her father; Heart attack (age of onset: 37) in her mother.  ROS:   Please see the history of present illness.     All other systems reviewed and are negative.   Prior CV studies:   The following studies were reviewed  today:  10/2017 echo Study Conclusions   - Left ventricle: The cavity size was normal. Systolic function was  normal. The estimated ejection fraction was in the range of 50%  to 55%. Wall  motion was normal; there were no regional wall  motion abnormalities. There was an increased relative  contribution of atrial contraction to ventricular filling.  Doppler parameters are consistent with abnormal left ventricular  relaxation (grade 1 diastolic dysfunction). Doppler parameters  are consistent with high ventricular filling pressure.  - Ventricular septum: Septal motion showed paradox.  - Tricuspid valve: There was trivial regurgitation.   10/2017 LHC/RHC Conclusions: 1. Mild to moderate, non-obstructive coronary artery disease involving the proximal/mid LAD and OM1. Overall appearance is similar to prior catheterizations in 2012. 2. Low normal to mildly reduced left ventricular contraction (LVEF ~50%). 3. Upper normal left and right heart filling pressures. 4. Normal pulmonary artery pressure. 5. Mildly elevated transpulmonary valve gradient. 6. Normal Fick cardiac output/index.   Labs/Other Tests and Data Reviewed:    EKG:  No ECG reviewed.  Recent Labs: No results found for requested labs within last 8760 hours.   Recent Lipid Panel Lab Results  Component Value Date/Time   CHOL 201 (H) 05/09/2015 05:55 AM   TRIG 104 05/09/2015 05:55 AM   HDL 53 05/09/2015 05:55 AM   CHOLHDL 3.8 05/09/2015 05:55 AM   LDLCALC 127 (H) 05/09/2015 05:55 AM    Wt Readings from Last 3 Encounters:  04/14/20 118 lb (53.5 kg)  03/21/20 118 lb (53.5 kg)  02/16/20 124 lb (56.2 kg)     Risk Assessment/Calculations:     Objective:    Vital Signs:  BP 129/69   Pulse 79   Ht 5\' 1"  (1.549 m)   Wt 118 lb (53.5 kg)   BMI 22.30 kg/m    Normal affect. NOrmal speech pattern and tone. Comfortable, no apparent distress. No audible signs of sob or wheezing.   ASSESSMENT & PLAN:     1. CAD/Chest pain - recent low risk stress test - no recent chest pains, some ongoing fatigue after recent COVID infection - continue to monitor.    COVID-19 Education: The signs and symptoms of COVID-19 were discussed with the patient and how to seek care for testing (follow up with PCP or arrange E-visit).  The importance of social distancing was discussed today.  Time:   Today, I have spent 13 minutes with the patient with telehealth technology discussing the above problems.     Medication Adjustments/Labs and Tests Ordered: Current medicines are reviewed at length with the patient today.  Concerns regarding medicines are outlined above.   Tests Ordered: No orders of the defined types were placed in this encounter.   Medication Changes: No orders of the defined types were placed in this encounter.   Follow Up:  In Person in 6 month(s)  Signed, Carlyle Dolly, MD  04/14/2020 1:19 PM

## 2020-04-19 ENCOUNTER — Telehealth: Payer: Self-pay | Admitting: Cardiology

## 2020-04-19 MED ORDER — ISOSORBIDE MONONITRATE ER 60 MG PO TB24: 180.0000 mg | ORAL_TABLET | Freq: Every day | ORAL | 0 refills | Status: DC

## 2020-04-19 NOTE — Telephone Encounter (Signed)
Pt aware - Medication sent to pharmacy.

## 2020-04-19 NOTE — Telephone Encounter (Signed)
Recent low risk stress test, can we increase her imdur to 180mg  daily and see if that helps with symptoms   J Audrey Birkland MD

## 2020-04-19 NOTE — Telephone Encounter (Signed)
New message    Pt c/o Shortness Of Breath: STAT if SOB developed within the last 24 hours or pt is noticeably SOB on the phone  1. Are you currently SOB (can you hear that pt is SOB on the phone)? no  2. How long have you been experiencing SOB? It comes and goes , been happening a lot of since she had covid , also having pain in her arms   3. Are you SOB when sitting or when up moving around? Both   4. Are you currently experiencing any other symptoms? She is getting pain in her arms, her heart rate was 109 last night, it seems to get worse when she is stressed

## 2020-04-19 NOTE — Telephone Encounter (Signed)
Pt says she had been SOB since having COVID went to pcp for this today who has pt on inhaler - recent episodes of chest pain starting Saturday - says she took 1 NTG Saturday night for chest pain rating pain 4/10 and NTG relieved pain after a few minutes- yesterday used 2 NTG yesterday for chest arm pain which resolved after 2nd dose says HR 110 yesterday -  BP 145/93 today HR 100 - says she has been very stressed lately - denies dizziness

## 2020-05-16 ENCOUNTER — Other Ambulatory Visit: Payer: Self-pay | Admitting: Neurology

## 2020-05-16 ENCOUNTER — Telehealth: Payer: Self-pay | Admitting: Internal Medicine

## 2020-05-16 MED ORDER — ANORO ELLIPTA 62.5-25 MCG/INH IN AEPB
1.0000 | INHALATION_SPRAY | Freq: Every day | RESPIRATORY_TRACT | 11 refills | Status: DC
Start: 2020-05-16 — End: 2021-10-09

## 2020-05-16 NOTE — Telephone Encounter (Signed)
Rx for Anoro which is what replaced Incruse has been sent to preferred pharmacy for pt. Called and spoke with pt letting her know this had been done and she verbalized understanding. Nothing further needed.

## 2020-05-23 ENCOUNTER — Ambulatory Visit: Payer: Medicare Other | Admitting: Cardiology

## 2020-06-17 ENCOUNTER — Other Ambulatory Visit: Payer: Self-pay | Admitting: Internal Medicine

## 2020-06-17 ENCOUNTER — Other Ambulatory Visit: Payer: Self-pay | Admitting: Neurology

## 2020-06-21 ENCOUNTER — Other Ambulatory Visit: Payer: Self-pay | Admitting: Internal Medicine

## 2020-06-27 DIAGNOSIS — E782 Mixed hyperlipidemia: Secondary | ICD-10-CM | POA: Diagnosis not present

## 2020-06-27 DIAGNOSIS — I700 Atherosclerosis of aorta: Secondary | ICD-10-CM | POA: Diagnosis not present

## 2020-06-27 DIAGNOSIS — G8929 Other chronic pain: Secondary | ICD-10-CM | POA: Diagnosis not present

## 2020-06-27 DIAGNOSIS — I2510 Atherosclerotic heart disease of native coronary artery without angina pectoris: Secondary | ICD-10-CM | POA: Diagnosis not present

## 2020-06-27 DIAGNOSIS — Z0000 Encounter for general adult medical examination without abnormal findings: Secondary | ICD-10-CM | POA: Diagnosis not present

## 2020-06-27 DIAGNOSIS — E441 Mild protein-calorie malnutrition: Secondary | ICD-10-CM | POA: Diagnosis not present

## 2020-06-27 DIAGNOSIS — G35 Multiple sclerosis: Secondary | ICD-10-CM | POA: Diagnosis not present

## 2020-06-27 DIAGNOSIS — D692 Other nonthrombocytopenic purpura: Secondary | ICD-10-CM | POA: Diagnosis not present

## 2020-06-27 DIAGNOSIS — I10 Essential (primary) hypertension: Secondary | ICD-10-CM | POA: Diagnosis not present

## 2020-06-27 DIAGNOSIS — J439 Emphysema, unspecified: Secondary | ICD-10-CM | POA: Diagnosis not present

## 2020-06-27 DIAGNOSIS — I5042 Chronic combined systolic (congestive) and diastolic (congestive) heart failure: Secondary | ICD-10-CM | POA: Diagnosis not present

## 2020-07-14 ENCOUNTER — Ambulatory Visit: Payer: Medicare Other | Admitting: Internal Medicine

## 2020-07-17 ENCOUNTER — Other Ambulatory Visit: Payer: Self-pay | Admitting: Cardiology

## 2020-07-18 ENCOUNTER — Other Ambulatory Visit: Payer: Self-pay | Admitting: Neurology

## 2020-07-18 DIAGNOSIS — G894 Chronic pain syndrome: Secondary | ICD-10-CM | POA: Diagnosis not present

## 2020-07-18 DIAGNOSIS — M4726 Other spondylosis with radiculopathy, lumbar region: Secondary | ICD-10-CM | POA: Diagnosis not present

## 2020-07-18 DIAGNOSIS — M4722 Other spondylosis with radiculopathy, cervical region: Secondary | ICD-10-CM | POA: Diagnosis not present

## 2020-07-18 DIAGNOSIS — Z79891 Long term (current) use of opiate analgesic: Secondary | ICD-10-CM | POA: Diagnosis not present

## 2020-07-28 DIAGNOSIS — I5042 Chronic combined systolic (congestive) and diastolic (congestive) heart failure: Secondary | ICD-10-CM | POA: Diagnosis not present

## 2020-07-28 DIAGNOSIS — G8929 Other chronic pain: Secondary | ICD-10-CM | POA: Diagnosis not present

## 2020-07-28 DIAGNOSIS — M810 Age-related osteoporosis without current pathological fracture: Secondary | ICD-10-CM | POA: Diagnosis not present

## 2020-07-28 DIAGNOSIS — I2510 Atherosclerotic heart disease of native coronary artery without angina pectoris: Secondary | ICD-10-CM | POA: Diagnosis not present

## 2020-07-28 DIAGNOSIS — E782 Mixed hyperlipidemia: Secondary | ICD-10-CM | POA: Diagnosis not present

## 2020-07-28 DIAGNOSIS — G4700 Insomnia, unspecified: Secondary | ICD-10-CM | POA: Diagnosis not present

## 2020-07-28 DIAGNOSIS — J439 Emphysema, unspecified: Secondary | ICD-10-CM | POA: Diagnosis not present

## 2020-07-28 DIAGNOSIS — K219 Gastro-esophageal reflux disease without esophagitis: Secondary | ICD-10-CM | POA: Diagnosis not present

## 2020-07-28 DIAGNOSIS — I10 Essential (primary) hypertension: Secondary | ICD-10-CM | POA: Diagnosis not present

## 2020-08-03 NOTE — Progress Notes (Signed)
NEUROLOGY FOLLOW UP OFFICE NOTE  ANNA BEAIRD [id number]  Assessment/Plan:   Multiple sclerosis, not on DMT  1.  Glatiramer 20mg  [place] daily 2.  Gabapentin 300mg  at bedtime for pain 2.  Provigil for daytime fatigue 3.  D3 2000 IU daily 4.  Repeat MRI of brain without contrast in 6 months 5.  Follow up after MRI in 6 months   Subjective:  Kandy Towery is a 75year old right-handed female with CAD,COPD,emphysema, CHF,thyroid disease, IBS, arthritis, depression, fibromyalgia who follows up for multiple sclerosis.  UPDATE: Current DMT:  glatiramer 20mg  [employer] daily Current medications: Gabapentin 300mg  at night and 100mg  during day if needed. Clonazepam 1mg  during day and 2mg  at bedtime for spasms D3 2000 IU daily Provigil 200mg  Doxepin 25mg  [facility]   She had COVID in October. Vision:Permanent vision loss in left eye Motor: Stable. Chronic head tremor Sensory: stable Pain: Pain in arms and legs. Lumbar radiculopathy Gait: Sometimes requires ambulation with cane or walker. Bowel/Bladder: Stable Fatigue: Yes Cognition: Short-term memory deficits. Mood: Depressed. She reports difficulty with speech. She has trouble with pronouncing them. It has been ongoing for several months but has gotten worse.   HISTORY: First symptom occurred in 1995, when she developed vision loss in the left eye, as well as left eye pain.MRI of the brain with and without contrast was performed on 03/28/95 revealed 10-12 periventricular white matter lesions and one lesion in the corpus callosum, suspicious for demyelination.There was also enhancement of the left optic nerve.She was referred to Dr. Delphia Grates, an MS specialist.Lumbar puncture was performed, revealing no oligoclonal bands or myelin basic protein.She has not required hospitalization or steroids since the late 1990s.She was on Betaseron and later Copaxone.Copaxone was discontinued due to longstanding  stability, but due to fatigue and cognitive issues, it was restarted in 2014.Her continued symptoms are episodes of worsening fatigue, as well as cognitive issues.Sometimes, she has difficulty thinking clearly.She will get disoriented while driving.Other times, she has word-finding difficulties.She once overpaid a bill.She still performs all ADLs, including paying the bills.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  Past disease-modifying agents:Betaseron (elevated LFTs and necrotic injection sites)  For fatigue, she takes Provigil.Adderal was ineffective.Amantadine was ineffective. For pain in the arms and legs, she takes gabapentin 300mg  at bedtime.She reportedly has lumbar radiculopathy which also contributes to the leg pain. Initially, she reportedly had significant spasticity.She was on oral baclofen which did not help.At one point, a baclofen pump was entertained.She currently takes clonazepam 1mg  during the day and 2mg  at bedtime, which is effective.  Other imaging: 05/07/95 MRI LUMBAR DJ:MEQA disc bulging at L4-5 without disc herniation or central canal stenosis. 03/09/96 MRI BRAIN W/WO (comparing to MRI from 03/28/95):slight increase in number of white matter lesions adjacent to the atrium of the right lateral ventricle, otherwise unchanged. 03/28/98 MRI BRAIN W/WO (comparing to MRI from 03/25/97):several subtle areas of increased signal in the deep white matter, not as prevalent as on prior study.No abnormal enhancement. 08/11/13 MRI of brain with and without contrast :multiple supratentorial and infratentorial T2 hyperintensities, but no post-contrast enhancement.However, this was not compared to prior imaging. 01/28/14 MRI Cervical spine without contrast:spinal cord unremarkable.[name] MRI of brain with and without  contrast from 08/17/14 is stable MRI of brain without contrast from 05/08/15 is stable. MRI of brain with and without contrast from 03/04/17 unchanged from  prior study on 05/08/15.  PAST MEDICAL HISTORY: Past Medical History:  Diagnosis Date  . Anxiety   . Aortic atherosclerosis ([place]) 09/29/2017   High Res CT in 9/18: aortic atherosclerosis; coronary artery calcification  . Cervical disc disease    BULGING  . Chronic combined systolic and diastolic CHF (congestive heart failure) ([place]) 09/29/2017   Echo 7/18: Moderate concentric LVH, grade 1 diastolic dysfunction, abnormal septal wall motion due to LBBB, moderate diffuse HK, EF 35-40, atrial septal aneurysm with possible PFO, mild TR // Echo 5/19: EF 50-55, normal wall motion, grade 1 diastolic dysfunction, trivial TR  . Complication of anesthesia    SLOW TO AWAKEN AFTER LAST COLONSCOPY  . Coronary artery disease 05/09/2015   LHC 10/12: EF 55, OM1 80-90 - difficult to engage >> treated medically // Nuc 8/18: EF 40, no ischemia  . Depression   . DJD (degenerative joint disease)    OA  . Dysrhythmia    Not clear where this diagnosis came from  . Emphysema of lung ([place])    Dr. Chase Caller  . Fibromyalgia   . H/O: liver disease 51 YRS AGO   tranamanitis due to previous interferon - LFTs improved off of  . History of MI (myocardial infarction) FEW YRS AGO  . History of TIA (transient ischemic attack)   . Hx of colonic polyps   . Hyperlipidemia    intol to statin - myalgias // ESPERION trial - patient stopped  . Multiple sclerosis ([place])   . Narcotic dependence ([facility])    hx of benzodiazepine and narcotic  . Osteoporosis   . Positive H. pylori test   . Urinary incontinence     MEDICATIONS: Current Outpatient Medications on File Prior to Visit  Medication Sig Dispense Refill  . albuterol (VENTOLIN HFA) 108 (90 Base) MCG/ACT inhaler INHALE 2 PUFFS INTO THE LUNGS EVERY 6 HOURS AS NEEDED FOR WHEEZING OR SHORTNESS OF BREATH 8.5 g 3  . Ascorbic  Acid (VITAMIN C) POWD Take 1,000 mg by mouth daily. Energizer packet    . aspirin EC 81 MG tablet Take 324 mg by mouth daily.     [name] CALCIUM PO Take 1 tablet by mouth daily.    . Cholecalciferol (VITAMIN D3) 125 MCG (5000 UT) CAPS Take 5,000 Units by mouth daily.     . clonazePAM (KLONOPIN) 1 MG tablet Take 1 mg by mouth 3 (three) times daily as needed for anxiety.     [name] COENZYME Q10 PO Take 1 capsule by mouth daily.     . diphenhydrAMINE (BENADRYL) 50 MG capsule Take 50 mg by mouth at bedtime as needed for sleep.     [name] doxepin (SINEQUAN) 25 MG capsule Take 1 capsule (25 mg total) by mouth at bedtime. 30 capsule 5  . fluticasone (FLONASE) 50 MCG/ACT nasal spray Place 2 sprays into both nostrils daily as needed for rhinitis.    . furosemide (LASIX) 20 MG tablet Take 20 mg (1 tablet) daily as needed for swelling. 90 tablet 1  . gabapentin (NEURONTIN) 100 MG capsule Take 100 mg by mouth daily as needed (Pain).     [name] gabapentin (NEURONTIN) 300 MG capsule TAKE 1 CAPSULE BY MOUTH DAILY 30 capsule 0  . GLATOPA 20 MG/ML SOSY injection USE 1 INJECTION SUBCUTANEOUSLY ONCE A DAY (Patient taking differently: Inject 20 mg into the skin daily. ) 30 mL 3  . guaiFENesin (MUCINEX) 600 MG 12 hr tablet Take 600 mg by mouth 2 (two) times daily as needed for  cough or to loosen phlegm.     . Homeopathic Products (SIMILASAN DRY EYE RELIEF OP) Place 1 drop into both eyes 2 (two) times daily.    [name] ipratropium (ATROVENT) 0.02 % nebulizer solution Take 2.5 mLs (0.5 mg total) by nebulization every 6 (six) hours as needed for wheezing or shortness of breath. 300 mL 5  . isosorbide mononitrate (IMDUR) 60 MG 24 hr tablet TAKE 3 TABLETS BY MOUTH ONCE DAILY 270 tablet 3  . losartan (COZAAR) 25 MG tablet TAKE 1 TABLET BY MOUTH TWICE (2) DAILY 180 tablet 3  . lovastatin (MEVACOR) 40 MG tablet Take 1 tablet (40 mg total) by mouth at bedtime. 90 tablet 3  . metoprolol succinate (TOPROL-XL) 25 MG 24 hr tablet Take 0.5 tablets (12.5 mg  total) by mouth 2 (two) times daily. 90 tablet 3  . modafinil (PROVIGIL) 200 MG tablet TAKE 1 TABLET BY MOUTH TWICE A DAY 60 tablet 0  . mometasone (NASONEX) 50 MCG/ACT nasal spray SPRAY 2 SPRAYS INTO THE NOSE DAILY 17 g 5  . Multiple Vitamin (MULTIVITAMIN WITH MINERALS) TABS tablet Take 1 tablet by mouth daily.    . nitroGLYCERIN (NITROSTAT) 0.4 MG SL tablet DISSOLVE 1 TABLET UNDER TONGUE AS NEEDEDFOR CHEST PAIN. MAY REPEAT 5 MINUTES APART 3 TIMES IF NEEDED 25 tablet 3  . NUCYNTA ER 50 MG TB12 Take 50 mg by mouth every 12 (twelve) hours.     . Omega-3 Fatty Acids (OMEGA 3 PO) Take 1 capsule by mouth daily.    [name] omeprazole (PRILOSEC) 20 MG capsule TAKE 1 CAPSULE BY MOUTH ONCE DAILY 30 capsule 5  . oxyCODONE-acetaminophen (PERCOCET) 5-325 MG per tablet Take 1 tablet by mouth every 4 (four) hours as needed for moderate pain or severe pain.     . polyethylene glycol powder (GLYCOLAX/MIRALAX) powder Take 17 g by mouth daily as needed for mild constipation or moderate constipation.     [name] PRISTIQ 50 MG 24 hr tablet Take 50 mg by mouth every evening.     . umeclidinium-vilanterol (ANORO ELLIPTA) 62.5-25 MCG/INH AEPB Inhale 1 puff into the lungs daily. 60 each 11  . vitamin B-12 (CYANOCOBALAMIN) 1000 MCG tablet Take 1,500 mcg by mouth daily.     No current facility-administered medications on file prior to visit.    ALLERGIES: Allergies  Allergen Reactions  . Contrast Media [Iodinated Diagnostic Agents] Rash and Other (See Comments)    Severe rash  . Crestor [Rosuvastatin] Hives and Other (See Comments)    welps  . Doxycycline   . Sulfonamide Derivatives Hives    As a child    FAMILY HISTORY: Family History  Problem Relation Age of Onset  . Heart attack Mother 78  . Heart attack Father 70  . Cancer Brother      Objective:  Blood pressure (!) 117/42, pulse (!) 119, height 5' (1.524 m), weight 114 lb 9.6 oz (52 kg), SpO2 98 %. General: No acute distress.  Patient appears well-groomed.    Head:  Normocephalic/atraumatic Eyes:  Fundi examined but not visualized Neck: supple, no paraspinal tenderness, full range of motion Heart:  Regular rate and rhythm Lungs:  Clear to auscultation bilaterally Back: No paraspinal tenderness Neurological Exam: alert and oriented to person, place, and time. Speech fluent and not dysarthric, language intact.  Left monocular vision loss.  Otherwise, CN II-XII intact. Bulk and tone normal, muscle strength 5/5 throughout.  Head tremor.  Sensation to light touch, temperature and vibration intact.  Deep tendon reflexes  3+ throughout, toes downgoing.  Finger to nose and heel to shin testing intact.  Gait steady; Romberg negative.     Metta Clines, DO  CC:  Donald Prose, MD

## 2020-08-04 ENCOUNTER — Other Ambulatory Visit: Payer: Self-pay

## 2020-08-04 ENCOUNTER — Ambulatory Visit: Payer: Medicare Other | Admitting: Neurology

## 2020-08-04 ENCOUNTER — Encounter: Payer: Self-pay | Admitting: Neurology

## 2020-08-04 VITALS — BP 117/42 | HR 119 | Ht 60.0 in | Wt 114.6 lb

## 2020-08-04 DIAGNOSIS — G35 Multiple sclerosis: Secondary | ICD-10-CM

## 2020-08-04 NOTE — Patient Instructions (Addendum)
Continue: Glatiramer Provigil Gabapentin D3 2000 IU daily  Repeat MRI of brain without contrast in 6 months Follow up 6 months.

## 2020-08-04 NOTE — Addendum Note (Signed)
Addended by: Venetia Night on: 08/04/2020 10:29 AM   Modules accepted: Orders

## 2020-08-15 DIAGNOSIS — M4726 Other spondylosis with radiculopathy, lumbar region: Secondary | ICD-10-CM | POA: Diagnosis not present

## 2020-08-15 DIAGNOSIS — G894 Chronic pain syndrome: Secondary | ICD-10-CM | POA: Diagnosis not present

## 2020-08-15 DIAGNOSIS — Z79891 Long term (current) use of opiate analgesic: Secondary | ICD-10-CM | POA: Diagnosis not present

## 2020-08-15 DIAGNOSIS — M4722 Other spondylosis with radiculopathy, cervical region: Secondary | ICD-10-CM | POA: Diagnosis not present

## 2020-08-16 ENCOUNTER — Other Ambulatory Visit: Payer: Self-pay | Admitting: Cardiology

## 2020-08-16 ENCOUNTER — Other Ambulatory Visit: Payer: Self-pay | Admitting: Neurology

## 2020-08-22 ENCOUNTER — Inpatient Hospital Stay: Admission: RE | Admit: 2020-08-22 | Payer: Medicare Other | Source: Ambulatory Visit

## 2020-08-24 ENCOUNTER — Encounter: Payer: Self-pay | Admitting: Internal Medicine

## 2020-08-24 ENCOUNTER — Ambulatory Visit: Payer: Medicare Other | Admitting: Internal Medicine

## 2020-08-24 ENCOUNTER — Other Ambulatory Visit: Payer: Self-pay

## 2020-08-24 VITALS — BP 110/68 | HR 103 | Temp 97.8°F | Ht 61.0 in | Wt 116.6 lb

## 2020-08-24 DIAGNOSIS — R918 Other nonspecific abnormal finding of lung field: Secondary | ICD-10-CM | POA: Diagnosis not present

## 2020-08-24 DIAGNOSIS — J438 Other emphysema: Secondary | ICD-10-CM | POA: Diagnosis not present

## 2020-08-24 NOTE — Progress Notes (Signed)
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 small left upper lobe and right lower lobe   CT scan feb 2021 shows stable left upper lobe nodule sinsce 2014 and improved RLL nodules after antibiotic doxy      Plan   = repeat ct chest without contrast in April 2022   Followup - tele visit in April/May 2022 to discuss CT result      SIGNATURE    Dr. Brand Males, M.D., F.C.C.P,  Pulmonary and Critical Care Medicine Staff Physician, [place] Director - Interstitial Lung Disease  Program  [facility] [place], [place], [address]  Pager: [phone], If no answer or between  15:00h - 7:00h: call [phone] Telephone: [phone]  11:47  AM 08/24/2020

## 2020-08-24 NOTE — Patient Instructions (Addendum)
Pulmonary emphysema (HCC)  - stable  plan - Continue anoro daily scheduled and do nebulizer as needed  - compliance important   Pulmonary nodules - small left upper lobe and right lower lobe   CT scan feb 2021 shows stable left upper lobe nodule sinsce 2014 and improved RLL nodules after antibiotic doxy      Plan   = repeat ct chest without contrast in April 2022   Followup - tele visit in April/May 2022 to discuss CT result

## 2020-08-26 ENCOUNTER — Other Ambulatory Visit: Payer: Medicare Other

## 2020-09-01 ENCOUNTER — Telehealth: Payer: Self-pay | Admitting: Internal Medicine

## 2020-09-01 ENCOUNTER — Ambulatory Visit (INDEPENDENT_AMBULATORY_CARE_PROVIDER_SITE_OTHER)
Admission: RE | Admit: 2020-09-01 | Discharge: 2020-09-01 | Disposition: A | Discharge status: Home / Self Care | Payer: Medicare Other | Source: Ambulatory Visit | Attending: Internal Medicine | Admitting: Internal Medicine

## 2020-09-01 ENCOUNTER — Other Ambulatory Visit: Payer: Self-pay

## 2020-09-01 DIAGNOSIS — J438 Other emphysema: Secondary | ICD-10-CM

## 2020-09-01 DIAGNOSIS — R918 Other nonspecific abnormal finding of lung field: Secondary | ICD-10-CM

## 2020-09-01 DIAGNOSIS — R0602 Shortness of breath: Secondary | ICD-10-CM | POA: Diagnosis not present

## 2020-09-01 NOTE — Telephone Encounter (Signed)
error 

## 2020-09-07 NOTE — Progress Notes (Signed)
Willl discuss at  09/12/20 visit  xxxxxx IMPRESSION: 1. Stable dominant subpleural nodule posteriorly in the left upper lobe from 2014, consistent with a benign finding. 2. Fluctuating tree in bud and ground-glass nodularity in the right upper lobe, slightly worse compared with most recent study. 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 3.  Coronary and aortic Atherosclerosis (ICD10-I70.0).   Electronically Signed   By: Richardean Sale M.D.   On: 09/01/2020 20:44

## 2020-09-11 ENCOUNTER — Ambulatory Visit (INDEPENDENT_AMBULATORY_CARE_PROVIDER_SITE_OTHER): Payer: Medicare Other | Admitting: Internal Medicine

## 2020-09-11 ENCOUNTER — Other Ambulatory Visit: Payer: Self-pay

## 2020-09-11 DIAGNOSIS — R918 Other nonspecific abnormal finding of lung field: Secondary | ICD-10-CM

## 2020-09-11 DIAGNOSIS — J438 Other emphysema: Secondary | ICD-10-CM

## 2020-09-11 NOTE — Patient Instructions (Addendum)
Pulmonary emphysema (HCC)  - stable  plan - Continue anoro daily scheduled and do nebulizer as needed  - compliance important   Pulmonary nodules - small left upper lobe and right lower lobe   CT scan feb 2021 shows stable left upper lobe nodule sinsce 2014 and improved RLL nodules after antibiotic doxy  But in Aprol 2022 though LUL nodule stable some mild increase in ground glass opacticities RUL     Plan   = repeat ct chest without contrast in Oct 2022 (6 months)   Followup - symptom visit with app in 3 months and in 6 months iwht Dr Chase Caller after CT

## 2020-09-11 NOTE — Addendum Note (Signed)
Addended by: Lorretta Harp on: 09/11/2020 11:08 AM   Modules accepted: Orders

## 2020-09-11 NOTE — Progress Notes (Signed)
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 by mouth daily as needed (Pain). , Disp: , Rfl:  .  gabapentin (NEURONTIN) 300 MG capsule, TAKE 1 CAPSULE BY MOUTH DAILY, Disp: 30 capsule, Rfl: 5 .  GLATOPA 20 MG/ML SOSY injection, USE 1 INJECTION SUBCUTANEOUSLY ONCE A DAY (Patient taking differently: Inject 20 mg into the skin daily.), Disp: 30 mL, Rfl: 3 .  guaiFENesin (MUCINEX) 600 MG 12 hr tablet, Take 600 mg by mouth 2 (two) times daily as needed for cough or to loosen phlegm. , Disp: , Rfl:  .  Homeopathic Products (SIMILASAN DRY EYE RELIEF OP), Place 1 drop into both eyes 2 (two) times daily., Disp: , Rfl:  .  ipratropium (ATROVENT) 0.02 % nebulizer solution, Take 2.5 mLs (0.5 mg total) by nebulization every 6  (six) hours as needed for wheezing or shortness of breath., Disp: 300 mL, Rfl: 5 .  isosorbide mononitrate (IMDUR) 60 MG 24 hr tablet, TAKE 3 TABLETS BY MOUTH ONCE DAILY, Disp: 270 tablet, Rfl: 3 .  losartan (COZAAR) 25 MG tablet, TAKE 1 TABLET BY MOUTH TWICE (2) DAILY, Disp: 180 tablet, Rfl: 3 .  lovastatin (MEVACOR) 40 MG tablet, Take 1 tablet (40 mg total) by mouth at bedtime., Disp: 90 tablet, Rfl: 3 .  metoprolol succinate (TOPROL-XL) 25 MG 24 hr tablet, Take 0.5 tablets (12.5 mg total) by mouth 2 (two) times daily., Disp: 90 tablet, Rfl: 3 .  modafinil (PROVIGIL) 200 MG tablet, TAKE 1 TABLET BY MOUTH TWICE A DAY, Disp: 60 tablet, Rfl: 2 .  mometasone (NASONEX) 50 MCG/ACT nasal spray, SPRAY 2 SPRAYS INTO THE NOSE DAILY, Disp: 17 g, Rfl: 5 .  Multiple Vitamin (MULTIVITAMIN WITH MINERALS) TABS tablet, Take 1 tablet by mouth daily., Disp: , Rfl:  .  nitroGLYCERIN (NITROSTAT) 0.4 MG SL tablet, DISSOLVE 1 TABLET UNDER TONGUE AS NEEDEDFOR CHEST PAIN. MAY REPEAT 5 MINUTES APART 3 TIMES IF NEEDED, Disp: 25 tablet, Rfl: 3 .  NUCYNTA ER 50 MG TB12, Take 50 mg by mouth every 12 (twelve) hours. , Disp: , Rfl:  .  Omega-3 Fatty Acids (OMEGA 3 PO), Take 1 capsule by mouth daily., Disp: , Rfl:  .  omeprazole (PRILOSEC) 20 MG capsule, TAKE 1 CAPSULE BY MOUTH ONCE DAILY, Disp: 30 capsule, Rfl: 5 .  oxyCODONE-acetaminophen (PERCOCET) 5-325 MG per tablet, Take 1 tablet by mouth every 4 (four) hours as needed for moderate pain or severe pain., Disp: , Rfl:  .  polyethylene glycol powder (GLYCOLAX/MIRALAX) powder, Take 17 g by mouth daily as needed for mild constipation or moderate constipation. , Disp: , Rfl:  .  PRISTIQ 50 MG 24 hr tablet, Take 50 mg by mouth every evening. , Disp: , Rfl:  .  umeclidinium-vilanterol (ANORO ELLIPTA) 62.5-25 MCG/INH AEPB, Inhale 1 puff into the lungs daily., Disp: 60 each, Rfl: 11 .  vitamin B-12 (CYANOCOBALAMIN) 1000 MCG tablet, Take 1,500 mcg by mouth daily., Disp: , Rfl:        Objective:   There were no vitals filed for this visit.  Estimated body mass index is 22.03 kg/m as calculated from the following:   Height as of 08/24/20: _0  (1.549 m).   Weight as of 08/24/20: 116 lb 9.6 oz (52.9 kg).  _1 @  There were no vitals filed for this visit.   Physical Exam      Assessment:       ICD-10-CM   1. Pulmonary nodules  R91.8   2. Other emphysema ([place])  J43.8        Plan:     Patient Instructions  Pulmonary emphysema ([place])  - stable  plan - Continue anoro daily scheduled and do nebulizer as needed  - compliance important   Pulmonary nodules - small left upper lobe and right lower lobe   CT scan feb 2021 shows stable left upper lobe nodule sinsce 2014 and improved RLL nodules after antibiotic doxy  But in Aprol 2022 though LUL nodule stable some mild increase in ground glass opacticities RUL     Plan   = repeat ct chest without contrast in Oct 2022 (6 months)   Followup - symptom visit with app in 3 months and in 6 months iwht Dr [name] after CT   (Telephone visit - Level 03 visit: Estb 21-30 for this visit type which was visit type: telephone visit in total care time and counseling or/and coordination of care by this undersigned MD - Dr Brand Males. This includes one or more of the following for care delivered on 09/11/2020 same day: pre-charting, chart review, note writing, documentation discussion of test results, diagnostic or treatment recommendations, prognosis, risks and benefits of management options, instructions, education, compliance or risk-factor reduction. It excludes time spent by the [place] or office staff in the care of the patient. Actual time was 25 min. E&M code is [phone])   SIGNATURE    Dr. Brand Males, M.D., F.C.C.P,  Pulmonary and Critical Care Medicine Staff Physician, [place] Director - Interstitial Lung Disease  Program  Pulmonary [facility] [address], [place], [address]  Pager: [phone], If no answer or between  15:00h - 7:00h: call [phone] Telephone: [phone]  10:26 AM 09/11/2020

## 2020-09-12 DIAGNOSIS — M4722 Other spondylosis with radiculopathy, cervical region: Secondary | ICD-10-CM | POA: Diagnosis not present

## 2020-09-12 DIAGNOSIS — M4726 Other spondylosis with radiculopathy, lumbar region: Secondary | ICD-10-CM | POA: Diagnosis not present

## 2020-09-12 DIAGNOSIS — G894 Chronic pain syndrome: Secondary | ICD-10-CM | POA: Diagnosis not present

## 2020-09-12 DIAGNOSIS — Z79891 Long term (current) use of opiate analgesic: Secondary | ICD-10-CM | POA: Diagnosis not present

## 2020-10-09 ENCOUNTER — Telehealth: Payer: Self-pay | Admitting: Neurology

## 2020-10-09 ENCOUNTER — Other Ambulatory Visit: Payer: Self-pay | Admitting: *Deleted

## 2020-10-09 DIAGNOSIS — G35 Multiple sclerosis: Secondary | ICD-10-CM

## 2020-10-09 MED ORDER — MODAFINIL 200 MG PO TABS: 200.0000 mg | ORAL_TABLET | Freq: Two times a day (BID) | ORAL | 2 refills | Status: DC

## 2020-10-09 NOTE — Telephone Encounter (Signed)
Pt advised to call her Cardiologist.

## 2020-10-09 NOTE — Telephone Encounter (Signed)
New message    Patient wants to know can she go back on adderall currently on  modafinil (PROVIGIL) 200 MG tablet. H/o heart condition   Patient left medication at daughter modafinil (PROVIGIL) 200 MG tablet which is 4 hour away.   [place]  Please advise

## 2020-10-09 NOTE — Telephone Encounter (Signed)
Per pt she wanted to make sure she can continue on Modafinil with heart condition if not will she be able to go back on Adderall.    Per pt she is aware she not making any sense of what she wants.  Pt did confirm she left her medication at her daughter or lost on the way back home.

## 2020-10-09 NOTE — Telephone Encounter (Signed)
She should no longer be on either medication unless her cardiologist approves.

## 2020-10-09 NOTE — Telephone Encounter (Signed)
Sent refill to the pharmacy  

## 2020-10-10 ENCOUNTER — Other Ambulatory Visit: Payer: Self-pay | Admitting: Cardiology

## 2020-10-10 ENCOUNTER — Other Ambulatory Visit: Payer: Self-pay | Admitting: Internal Medicine

## 2020-10-10 DIAGNOSIS — M4722 Other spondylosis with radiculopathy, cervical region: Secondary | ICD-10-CM | POA: Diagnosis not present

## 2020-10-10 DIAGNOSIS — G894 Chronic pain syndrome: Secondary | ICD-10-CM | POA: Diagnosis not present

## 2020-10-10 DIAGNOSIS — M4726 Other spondylosis with radiculopathy, lumbar region: Secondary | ICD-10-CM | POA: Diagnosis not present

## 2020-10-10 DIAGNOSIS — Z79891 Long term (current) use of opiate analgesic: Secondary | ICD-10-CM | POA: Diagnosis not present

## 2020-10-17 ENCOUNTER — Telehealth: Payer: Self-pay | Admitting: Cardiology

## 2020-10-17 NOTE — Telephone Encounter (Signed)
Error

## 2020-10-23 ENCOUNTER — Ambulatory Visit: Payer: Medicare Other | Admitting: Cardiology

## 2020-10-23 ENCOUNTER — Encounter: Payer: Self-pay | Admitting: Cardiology

## 2020-10-23 VITALS — BP 140/80 | HR 76 | Ht 61.0 in | Wt 121.0 lb

## 2020-10-23 DIAGNOSIS — I2510 Atherosclerotic heart disease of native coronary artery without angina pectoris: Secondary | ICD-10-CM

## 2020-10-23 DIAGNOSIS — R079 Chest pain, unspecified: Secondary | ICD-10-CM | POA: Diagnosis not present

## 2020-10-23 MED ORDER — ISOSORBIDE MONONITRATE ER 120 MG PO TB24: 240.0000 mg | ORAL_TABLET | Freq: Every day | ORAL | 3 refills | Status: DC

## 2020-10-23 NOTE — Patient Instructions (Addendum)
Medication Instructions:   Increase Imdur to 240mg  daily.   Continue all other medications.    Labwork: none  Testing/Procedures: none  Follow-Up: 6 months   Any Other Special Instructions Will Be Listed Below (If Applicable).  If you need a refill on your cardiac medications before your next appointment, please call your pharmacy.

## 2020-10-23 NOTE — Progress Notes (Signed)
Clinical Summary Ms. Constantino is a 75 y.o.female seen today for follow up of the following medical problems.    1. CAD 10/2017 cath: LM patent, LAD prox 40%, OM1 60%, patent RCA. Mean PA 17, PCWP 15, CI 2.9  Overall mild to moderate nonobstructive disease stable from 2012 cath - 10/2017 LVEF 50-55%, no WMAs, grade I diastolic dysfunction - 06/4479 Dr Einar Gip nuclear stress: no clear ischemia - 2018 Dr Einar Gip echo: LVEF 35-40% - 02/2018 48 hr holter [facility]: benign atrial and ventricular ectopy    Toprol stopped she reports due to fatiguein the past, though unclear if was clearly related  01/2020 nuclear stress: septal infarct, small apical infarct with mild peri-infarct ischemia.  - some ongoing chest pain -3 weeks ago, left sided pain. Started left chest into shoudler/neck/face and down left side into leg. Burning sensation. +SOB Felt sweaty. NOt positional. Pain lasted about 10 minutes, took NG x 2. One other episode.  - reports significant stress, family related    2. COVID + COVID + 03/01/20    3. HTN -comppliant with meds   4. Hyperlipidemia -she is compliant with statin   5. Chronic LBBB  6. COPD - followed by pulmonary Dr Chase Caller    SH: Josph Macho is her brother, also a patient of mine.    Past Medical History:  Diagnosis Date  . Anxiety   . Aortic atherosclerosis ([place]) 09/29/2017   High Res CT in 9/18: aortic atherosclerosis; coronary artery calcification  . Cervical disc disease    BULGING  . Chronic combined systolic and diastolic CHF (congestive heart failure) ([place]) 09/29/2017   Echo 7/18: Moderate concentric LVH, grade 1 diastolic dysfunction, abnormal septal wall motion due to LBBB, moderate diffuse HK, EF 35-40, atrial septal aneurysm with possible PFO, mild TR // Echo 5/19: EF 50-55, normal wall motion, grade 1 diastolic dysfunction, trivial TR  . Complication of anesthesia    SLOW TO AWAKEN AFTER LAST COLONSCOPY  .  Coronary artery disease 05/09/2015   LHC 10/12: EF 55, OM1 80-90 - difficult to engage >> treated medically // Nuc 8/18: EF 40, no ischemia  . Depression   . DJD (degenerative joint disease)    OA  . Dysrhythmia    Not clear where this diagnosis came from  . Emphysema of lung ([place])    Dr. Chase Caller  . Fibromyalgia   . 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 History of MI (myocardial infarction) FEW YRS AGO  . History of TIA (transient ischemic attack)   . Hx of colonic polyps   . Hyperlipidemia    intol to statin - myalgias // ESPERION trial - patient stopped  . Multiple sclerosis ([place])   . Narcotic dependence ([facility])    hx of benzodiazepine and narcotic  . Osteoporosis   . Positive H. pylori test   . Urinary incontinence      Allergies  Allergen Reactions  . Contrast Media [Iodinated Diagnostic Agents] Rash and Other (See Comments)    Severe rash  . Crestor [Rosuvastatin] Hives and Other (See Comments)    welps  . Doxycycline   . Sulfonamide Derivatives Hives    As a child     Current Outpatient Medications  Medication Sig Dispense Refill  . albuterol (VENTOLIN HFA) 108 (90 Base) MCG/ACT inhaler INHALE 2 PUFFS INTO THE LUNGS EVERY 6 HOURS AS NEEDED FOR WHEEZING OR SHORTNESS OF BREATH 8.5  g 3  . Ascorbic Acid (VITAMIN C) POWD Take 1,000 mg by mouth daily. Energizer packet    . aspirin EC 81 MG tablet Take 324 mg by mouth daily.     [name] CALCIUM PO Take 1 tablet by mouth daily.    . Cholecalciferol (VITAMIN D3) 125 MCG (5000 UT) CAPS Take 5,000 Units by mouth daily.     . clonazePAM (KLONOPIN) 1 MG tablet Take 1 mg by mouth 3 (three) times daily as needed for anxiety.     [name] COENZYME Q10 PO Take 1 capsule by mouth daily.     . diphenhydrAMINE (BENADRYL) 50 MG capsule Take 50 mg by mouth at bedtime as needed for sleep.    [name] doxepin (SINEQUAN) 25 MG capsule Take 1 capsule (25 mg total) by mouth at bedtime. 30 capsule 5   . fluticasone (FLONASE) 50 MCG/ACT nasal spray Place 2 sprays into both nostrils daily as needed for rhinitis.    . furosemide (LASIX) 20 MG tablet TAKE 1 TABLET BY MOUTH ONCE A DAY AS NEEDED FOR SWELLING 90 tablet 1  . gabapentin (NEURONTIN) 100 MG capsule Take 100 mg by mouth daily as needed (Pain).     [name] gabapentin (NEURONTIN) 300 MG capsule TAKE 1 CAPSULE BY MOUTH DAILY 30 capsule 5  . GLATOPA 20 MG/ML SOSY injection USE 1 INJECTION SUBCUTANEOUSLY ONCE A DAY (Patient taking differently: Inject 20 mg into the skin daily.) 30 mL 3  . guaiFENesin (MUCINEX) 600 MG 12 hr tablet Take 600 mg by mouth 2 (two) times daily as needed for cough or to loosen phlegm.     . Homeopathic Products (SIMILASAN DRY EYE RELIEF OP) Place 1 drop into both eyes 2 (two) times daily.    [name] ipratropium (ATROVENT) 0.02 % nebulizer solution Take 2.5 mLs (0.5 mg total) by nebulization every 6 (six) hours as needed for wheezing or shortness of breath. 300 mL 5  . isosorbide mononitrate (IMDUR) 60 MG 24 hr tablet TAKE 3 TABLETS BY MOUTH ONCE DAILY 270 tablet 3  . losartan (COZAAR) 25 MG tablet TAKE 1 TABLET BY MOUTH TWICE A DAY 180 tablet 3  . lovastatin (MEVACOR) 40 MG tablet Take 1 tablet (40 mg total) by mouth at bedtime. 90 tablet 3  . metoprolol succinate (TOPROL-XL) 25 MG 24 hr tablet Take 0.5 tablets (12.5 mg total) by mouth 2 (two) times daily. 90 tablet 3  . modafinil (PROVIGIL) 200 MG tablet Take 1 tablet (200 mg total) by mouth 2 (two) times daily. 60 tablet 2  . mometasone (NASONEX) 50 MCG/ACT nasal spray SPRAY 2 SPRAYS INTO THE NOSE DAILY 17 g 5  . Multiple Vitamin (MULTIVITAMIN WITH MINERALS) TABS tablet Take 1 tablet by mouth daily.    . nitroGLYCERIN (NITROSTAT) 0.4 MG SL tablet DISSOLVE 1 TABLET UNDER TONGUE AS NEEDEDFOR CHEST PAIN. MAY REPEAT 5 MINUTES APART 3 TIMES IF NEEDED 25 tablet 3  . NUCYNTA ER 50 MG TB12 Take 50 mg by mouth every 12 (twelve) hours.     . Omega-3 Fatty Acids (OMEGA 3 PO) Take 1 capsule  by mouth daily.    [name] omeprazole (PRILOSEC) 20 MG capsule TAKE 1 CAPSULE BY MOUTH ONCE DAILY 30 capsule 5  . oxyCODONE-acetaminophen (PERCOCET) 5-325 MG per tablet Take 1 tablet by mouth every 4 (four) hours as needed for moderate pain or severe pain.    . polyethylene glycol powder (GLYCOLAX/MIRALAX) powder Take 17 g by mouth daily as needed for mild constipation or moderate constipation.     [name]  PRISTIQ 50 MG 24 hr tablet Take 50 mg by mouth every evening.     . umeclidinium-vilanterol (ANORO ELLIPTA) 62.5-25 MCG/INH AEPB Inhale 1 puff into the lungs daily. 60 each 11  . vitamin B-12 (CYANOCOBALAMIN) 1000 MCG tablet Take 1,500 mcg by mouth daily.     No current facility-administered medications for this visit.     Past Surgical History:  Procedure Laterality Date  . BACK SURGERY  11-2009   LOWER BACK  . BOTOX INJECTION N/A 03/21/2016   Procedure: BOTOX INJECTION;  Surgeon: Wilford Corner, MD;  Location: [facility];  Service: Endoscopy;  Laterality: N/A;  . CARDIAC CATHETERIZATION     UNSUCCESSFUL STENT PLACEMENT  . COLONSCOPY    . ESOPHAGEAL MANOMETRY N/A 10/23/2015   Procedure: ESOPHAGEAL MANOMETRY (EM);  Surgeon: Wilford Corner, MD;  Location: [facility];  Service: Endoscopy;  Laterality: N/A;  . ESOPHAGOGASTRODUODENOSCOPY (EGD) WITH PROPOFOL N/A 03/21/2016   Procedure: ESOPHAGOGASTRODUODENOSCOPY (EGD) WITH PROPOFOL;  Surgeon: Wilford Corner, MD;  Location: [facility];  Service: Endoscopy;  Laterality: N/A;  . RIGHT/LEFT HEART CATH AND CORONARY ANGIOGRAPHY N/A 10/02/2017   Procedure: RIGHT/LEFT HEART CATH AND CORONARY ANGIOGRAPHY;  Surgeon: Nelva Bush, MD;  Location: [facility];  Service: Cardiovascular;  Laterality: N/A;  . TUBAL LIGATION    . VIDEO BRONCHOSCOPY Bilateral 05/18/2019   Procedure: VIDEO BRONCHOSCOPY WITHOUT FLUORO;  Surgeon: Brand Males, MD;  Location: [facility] ENDOSCOPY;  Service: Endoscopy;  Laterality: Bilateral;     Allergies  Allergen  Reactions  . Contrast Media [Iodinated Diagnostic Agents] Rash and Other (See Comments)    Severe rash  . Crestor [Rosuvastatin] Hives and Other (See Comments)    welps  . Doxycycline   . Sulfonamide Derivatives Hives    As a child      Family History  Problem Relation Age of Onset  . Heart attack Mother 3  . Heart attack Father 15  . Cancer Brother      Social History Ms. Phillis reports that she quit smoking about 10 years ago. Her smoking use included cigarettes. She has a 48.00 pack-year smoking history. She has never used smokeless tobacco. Ms. Bown reports no history of alcohol use.   Review of Systems CONSTITUTIONAL: No weight loss, fever, chills, weakness or fatigue.  HEENT: Eyes: No visual loss, blurred vision, double vision or yellow sclerae.No hearing loss, sneezing, congestion, runny nose or sore throat.  SKIN: No rash or itching.  CARDIOVASCULAR: per hpi RESPIRATORY: No shortness of breath, cough or sputum.  GASTROINTESTINAL: No anorexia, nausea, vomiting or diarrhea. No abdominal pain or blood.  GENITOURINARY: No burning on urination, no polyuria NEUROLOGICAL: No headache, dizziness, syncope, paralysis, ataxia, numbness or tingling in the extremities. No change in bowel or bladder control.  MUSCULOSKELETAL: No muscle, back pain, joint pain or stiffness.  LYMPHATICS: No enlarged nodes. No history of splenectomy.  PSYCHIATRIC: No history of depression or anxiety.  ENDOCRINOLOGIC: No reports of sweating, cold or heat intolerance. No polyuria or polydipsia.  [name]   Physical Examination Today's Vitals   10/23/20 1338  BP: 140/80  Pulse: 76  SpO2: 96%  Weight: 121 lb (54.9 kg)  Height: 5\' 1"  (1.549 m)   Body mass index is 22.86 kg/m.  Gen: resting comfortably, no acute distress HEENT: no scleral icterus, pupils equal round and reactive, no palptable cervical adenopathy,  CV: RRR, no m/r/g, no jvd Resp: Clear to auscultation bilaterally GI: abdomen is  soft, non-tender, non-distended, normal bowel sounds, no hepatosplenomegaly MSK: extremities are  warm, no edema.  Skin: warm, no rash Neuro:  no focal deficits Psych: appropriate affect   Diagnostic Studies 10/2017 echo Study Conclusions   - Left ventricle: The cavity size was normal. Systolic function was  normal. The estimated ejection fraction was in the range of 50%  to 55%. Wall motion was normal; there were no regional wall  motion abnormalities. There was an increased relative  contribution of atrial contraction to ventricular filling.  Doppler parameters are consistent with abnormal left ventricular  relaxation (grade 1 diastolic dysfunction). Doppler parameters  are consistent with high ventricular filling pressure.  - Ventricular septum: Septal motion showed paradox.  - Tricuspid valve: There was trivial regurgitation.   10/2017 LHC/RHC Conclusions: 1. Mild to moderate, non-obstructive coronary artery disease involving the proximal/mid LAD and OM1. Overall appearance is similar to prior catheterizations in 2012. 2. Low normal to mildly reduced left ventricular contraction (LVEF ~50%). 3. Upper normal left and right heart filling pressures. 4. Normal pulmonary artery pressure. 5. Mildly elevated transpulmonary valve gradient. 6. Normal Fick cardiac output/index.  01/2020 nuclear stress  There was no ST segment deviation noted during stress.  Findings consistent with prior septal myocardial infarction. Small apical infarct with mild peri-infarct ischemia.  This is a low risk study.  The left ventricular ejection fraction is normal (55-65%).      Assessment and Plan  1. CAD/Chest pain - recent low risk stress test. Prior cath 2 years ago without obstructive disease similar to 2012 cath -ongoing atypical symptoms that seem to improve with NG - increase imdur to 240mg  daily and monitor symptoms       Arnoldo Lenis, M.D.

## 2020-10-24 ENCOUNTER — Telehealth: Payer: Self-pay | Admitting: Cardiology

## 2020-10-24 NOTE — Telephone Encounter (Signed)
Pt states that her arms and legs are swollen. Swelling has been going on for 3 weeks. Pt state that she is prescribed 20 mg of lasix daily. She admits to taking 2-3 tablets daily. She states that the increased dose is not helping with the fluid. Pt denies being SOB, chest pain. Pt unable to weigh herself d/t not having a scale. Please advise.

## 2020-10-24 NOTE — Telephone Encounter (Signed)
Patient called stating that the furosemide (LASIX) 20 MG tablet is not working. She continues to retain fluid. Can the dosage be increased?

## 2020-10-27 ENCOUNTER — Other Ambulatory Visit: Payer: Self-pay | Admitting: Cardiology

## 2020-10-27 MED ORDER — TORSEMIDE 20 MG PO TABS: 20.0000 mg | ORAL_TABLET | ORAL | 2 refills | Status: DC | PRN

## 2020-10-27 NOTE — Telephone Encounter (Signed)
Pt verbalized understanding. Medication changes made to reflect medication changes.

## 2020-10-27 NOTE — Telephone Encounter (Signed)
Can change lasix to torsemide 20mg  prn swelling and update [place] nexst week  Zandra Abts MD

## 2020-11-04 ENCOUNTER — Other Ambulatory Visit: Payer: Self-pay | Admitting: Neurology

## 2020-11-04 DIAGNOSIS — G35 Multiple sclerosis: Secondary | ICD-10-CM

## 2020-11-06 ENCOUNTER — Other Ambulatory Visit: Payer: Self-pay | Admitting: Neurology

## 2020-11-06 DIAGNOSIS — G35 Multiple sclerosis: Secondary | ICD-10-CM

## 2020-11-07 ENCOUNTER — Other Ambulatory Visit: Payer: Self-pay | Admitting: Neurology

## 2020-11-07 ENCOUNTER — Telehealth: Payer: Self-pay | Admitting: Neurology

## 2020-11-07 ENCOUNTER — Other Ambulatory Visit: Payer: Self-pay

## 2020-11-07 DIAGNOSIS — G35 Multiple sclerosis: Secondary | ICD-10-CM

## 2020-11-07 NOTE — Telephone Encounter (Signed)
Telephone call to [facility], Per pt chart she not be on that medication until she speak to her Cardiologist.   Pt was advised on 10/09/20.  Gerald Stabs will reach out to the pt.

## 2020-11-07 NOTE — Telephone Encounter (Signed)
Pharmacy said he needs refill 200mg  modafinil. March rx has no refills. He said he has sent this in 2x

## 2020-11-08 ENCOUNTER — Other Ambulatory Visit: Payer: Self-pay | Admitting: Neurology

## 2020-11-08 DIAGNOSIS — G35 Multiple sclerosis: Secondary | ICD-10-CM

## 2020-11-09 ENCOUNTER — Other Ambulatory Visit: Payer: Self-pay | Admitting: Neurology

## 2020-11-09 ENCOUNTER — Telehealth: Payer: Self-pay | Admitting: Neurology

## 2020-11-09 DIAGNOSIS — G35 Multiple sclerosis: Secondary | ICD-10-CM

## 2020-11-09 MED ORDER — MODAFINIL 200 MG PO TABS: 200.0000 mg | ORAL_TABLET | Freq: Two times a day (BID) | ORAL | 2 refills | Status: DC

## 2020-11-09 NOTE — Telephone Encounter (Signed)
Patient is very agitated and would like a call back asap.Said drug store closes at 5 so she would like a call back regarding her medicine.

## 2020-11-09 NOTE — Telephone Encounter (Signed)
Patient states that she needs to know why we will not refill her medication she states that the drug store has sent [place] stuff for the last 3 days and has not heard anything from [place].
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
## 2020-11-10 NOTE — Telephone Encounter (Signed)
Pt called back in wanting to speak with someone about her prescription

## 2020-11-10 NOTE — Telephone Encounter (Signed)
Refaxed Script.

## 2020-11-13 ENCOUNTER — Telehealth: Payer: Self-pay | Admitting: Neurology
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
## 2020-11-13 NOTE — Telephone Encounter (Signed)
Patient called and requested a call back from [facility]. She declined several times to give any more information other that she is following up from a previous conversation.

## 2020-11-13 NOTE — Telephone Encounter (Signed)
Dr. Tomi Likens patient
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
## 2020-11-15 ENCOUNTER — Telehealth: Payer: Self-pay | Admitting: Cardiology

## 2020-11-15 DIAGNOSIS — G894 Chronic pain syndrome: Secondary | ICD-10-CM | POA: Diagnosis not present

## 2020-11-15 DIAGNOSIS — Z79891 Long term (current) use of opiate analgesic: Secondary | ICD-10-CM | POA: Diagnosis not present

## 2020-11-15 DIAGNOSIS — M4722 Other spondylosis with radiculopathy, cervical region: Secondary | ICD-10-CM | POA: Diagnosis not present

## 2020-11-15 DIAGNOSIS — M4726 Other spondylosis with radiculopathy, lumbar region: Secondary | ICD-10-CM | POA: Diagnosis not present

## 2020-11-15 NOTE — Telephone Encounter (Signed)
Please give pt a call concerning her metoprolol succinate (TOPROL-XL) 25 MG 24 hr tablet [[id number]] it's making her very tired.   Please call [phone]

## 2020-11-15 NOTE — Telephone Encounter (Signed)
Voiced that she thinks her toprol xl 12.5 mg BID is causing her to sleep a lot and stay tired. Says she did not feel this way until her provigil was stopped. Advised that she has been on toprol for awhile and it may or may not be coming from toprol but message would be sent to provider for recommendations.

## 2020-11-20 NOTE — Telephone Encounter (Signed)
Patient will decrease toprol to 12.5 mg daily instead of twice a day. She will let us know if this makes a difference.

## 2020-11-20 NOTE — Telephone Encounter (Signed)
From notes has been on that dose at least a year and a half. Can try lowering to just once a day and see if any better, I think unlikely the cause   Zandra Abts MD

## 2020-11-22 ENCOUNTER — Telehealth: Payer: Self-pay | Admitting: Cardiology

## 2020-11-22 NOTE — Telephone Encounter (Signed)
New message     Can any of her medications cause nose bleeds ? She thinks dr branch increased this one and she started getting nose bleeds   isosorbide mononitrate (IMDUR) 120 MG 24 hr tablet

## 2020-11-22 NOTE — Telephone Encounter (Signed)
Advised that imdur does not cause nose bleeds and if she is having them frequently that she should contact her PCP for an evaluation. Verbalized understanding.

## 2020-11-28 ENCOUNTER — Telehealth: Payer: Self-pay | Admitting: Cardiology

## 2020-11-28 NOTE — Telephone Encounter (Signed)
Pt is having to take 3 of her torsemide (DEMADEX) 20 MG tablet [[id number]] a day and she's out and needing a refill - she's having swelling on her left side

## 2020-11-28 NOTE — Telephone Encounter (Signed)
Reports swelling in hand, feet and legs. Weight today 123 lbs. Does not weigh daily and does not check vitals Reports SOB. Reports no recent lab work done. Confirmed that she takes torsemide 20 mg most days but has taken 60 mg daily for the past 2-3 days. Advised that she needed to weigh daily, continue monitoring symptoms. First available appointment given to see [facility] 12/21/20 @3 :30 pm. Advised if symptoms got worse to go to the ED for an evaluation.

## 2020-12-07 ENCOUNTER — Other Ambulatory Visit: Payer: Self-pay | Admitting: Internal Medicine

## 2020-12-07 ENCOUNTER — Other Ambulatory Visit: Payer: Self-pay | Admitting: Cardiology

## 2020-12-11 ENCOUNTER — Encounter: Payer: Self-pay | Admitting: Primary Care

## 2020-12-11 ENCOUNTER — Other Ambulatory Visit: Payer: Self-pay

## 2020-12-11 ENCOUNTER — Ambulatory Visit: Payer: Medicare Other | Admitting: Primary Care

## 2020-12-11 DIAGNOSIS — R911 Solitary pulmonary nodule: Secondary | ICD-10-CM

## 2020-12-11 DIAGNOSIS — J438 Other emphysema: Secondary | ICD-10-CM | POA: Diagnosis not present

## 2020-12-11 DIAGNOSIS — Z9189 Other specified personal risk factors, not elsewhere classified: Secondary | ICD-10-CM | POA: Diagnosis not present
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
## 2020-12-11 NOTE — Assessment & Plan Note (Addendum)
-   She had an isolated incidence of hemoptysis that has resolved  - Due for repeat CT CHEST imaging in October 2022 - Patient will notify us if hemoptysis return

## 2020-12-11 NOTE — Assessment & Plan Note (Addendum)
-   Stable; No recent exacerbations or hospitalizations. She has morning congestion only. Compliant with Anoro Ellitpa. Uses Atovent nebulizer 2-4 times a day. Encourage light physical exercise, aim to get out of bed three times a day

## 2020-12-11 NOTE — Patient Instructions (Addendum)
  Recommendations: - No medication changes today - Please call one of the below number today and ask for assistance  - Due for CT chest in October 2022 - If you develop any further blood when you cough please let us know   Follow-up: - 3 months with DR. Ramaswamy after CT imaging (15 min slot) ____________________________________________________________________  [employer] Violence Hotline  Phone: [phone]  Family Victims' [place] - To reach the Family Victims' Unit Call [phone] - If you are in immediate danger, please call 9-1-1 or [facility] 24-hour Crisis Hotline at [phone].  [place] [employer] Phone: [phone]
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
## 2020-12-13 ENCOUNTER — Other Ambulatory Visit: Payer: Self-pay

## 2020-12-13 DIAGNOSIS — G459 Transient cerebral ischemic attack, unspecified: Secondary | ICD-10-CM

## 2020-12-14 ENCOUNTER — Encounter: Payer: Self-pay | Admitting: *Deleted

## 2020-12-14 ENCOUNTER — Other Ambulatory Visit: Payer: Self-pay | Admitting: *Deleted

## 2020-12-14 DIAGNOSIS — G894 Chronic pain syndrome: Secondary | ICD-10-CM | POA: Diagnosis not present

## 2020-12-14 DIAGNOSIS — M4722 Other spondylosis with radiculopathy, cervical region: Secondary | ICD-10-CM | POA: Diagnosis not present

## 2020-12-14 DIAGNOSIS — M4726 Other spondylosis with radiculopathy, lumbar region: Secondary | ICD-10-CM | POA: Diagnosis not present

## 2020-12-14 DIAGNOSIS — Z79891 Long term (current) use of opiate analgesic: Secondary | ICD-10-CM | POA: Diagnosis not present
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
## 2020-12-15 ENCOUNTER — Other Ambulatory Visit: Payer: Self-pay | Admitting: Cardiology

## 2020-12-19 ENCOUNTER — Telehealth: Payer: Self-pay | Admitting: Cardiology

## 2020-12-19 ENCOUNTER — Telehealth: Payer: Self-pay | Admitting: Internal Medicine
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
## 2020-12-20 NOTE — Telephone Encounter (Signed)
Spoke with the pt  She states that she does not think she needs copy of her notes anymore  She states will call back if needed Nothing further needed at this time

## 2020-12-21 ENCOUNTER — Other Ambulatory Visit: Payer: Self-pay | Admitting: *Deleted

## 2020-12-21 ENCOUNTER — Ambulatory Visit: Payer: Medicare Other | Admitting: Student
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
## 2020-12-25 ENCOUNTER — Encounter: Payer: Self-pay | Admitting: *Deleted

## 2020-12-25 NOTE — Telephone Encounter (Signed)
Patient notified and verbalized understanding.  Will put in the mail at patient's request.

## 2020-12-25 NOTE — Telephone Encounter (Signed)
Please provide the following in a letter for the patient   To whom it may concern,   Mrs. [name] is followed in our cardiology clinic and has asked for a general letter describing her cardiac conditions. She does have a history of coronary artery disease. A prior heart catheterization showed moderate blockages in the arteries of her heart, she has not had any severe blockages that have required opening. She has intermittent chest pains and is on medications to try and control these. She has a history of heart failure. In 2018 her heart pumping function was found to be decreased by about 1/3. Fortunately with medications her heart function has improved over the years and from her last test in 2019 was actually in the low normal range. She also is on medications for her history of high blood pressure and choletsterol. She has a history of COPD and is followed by a pulmonary specialist.    Carlyle Dolly MD

## 2020-12-29 ENCOUNTER — Other Ambulatory Visit: Payer: Self-pay | Admitting: *Deleted
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
## 2021-01-01 ENCOUNTER — Other Ambulatory Visit: Payer: Self-pay | Admitting: Cardiology

## 2021-01-03 NOTE — Progress Notes (Deleted)
Cardiology Office Note    Date:  01/03/2021   ID:  [name], [name] [birth year], MRN [id number]   PCP:  Donald Prose, [place]  Cardiologist:  Carlyle Dolly, MD *** Advanced Practice Provider:  No care team member to display Electrophysiologist:  None   [phone]   No chief complaint on file.   History of Present Illness:  [name] is a 75 y.o. female with history of CAD cath 10/2017 40% proximal LAD, 60% OM1, patent RCA overall mild to moderate nonobstructive disease stable since 2012 cath, NST 01/2020 septal infarct small apical infarct with mild peri-infarct ischemia, low risk, hypertension, HLD, chronic LBBB, COPD  Patient saw Dr. Harl Bowie 10/2020 and was doing well.  Past Medical History:  Diagnosis Date   Anxiety    Aortic atherosclerosis ([place]) 09/29/2017   High Res CT in 9/18: aortic atherosclerosis; coronary artery calcification   Cervical disc disease    BULGING   Chronic combined systolic and diastolic CHF (congestive heart failure) ([place]) 09/29/2017   Echo 7/18: Moderate concentric LVH, grade 1 diastolic dysfunction, abnormal septal wall motion due to LBBB, moderate diffuse HK, EF 35-40, atrial septal aneurysm with possible PFO, mild TR // Echo 5/19: EF 50-55, normal wall motion, grade 1 diastolic dysfunction, trivial TR   Complication of anesthesia    SLOW TO AWAKEN AFTER LAST COLONSCOPY   Coronary artery disease 05/09/2015   LHC 10/12: EF 55, OM1 80-90 - difficult to engage >> treated medically // Nuc 8/18: EF 40, no ischemia   Depression    DJD (degenerative joint disease)    OA   Dysrhythmia    Not clear where this diagnosis came from   Emphysema of lung (HCC)    Dr. Chase Caller   Fibromyalgia    H/O: liver disease 46 YRS AGO   tranamanitis due to previous interferon - LFTs improved off of   History of MI (myocardial infarction) FEW YRS AGO   History of TIA (transient ischemic attack)    Hx of colonic polyps     Hyperlipidemia    intol to statin - myalgias // ESPERION trial - patient stopped   Multiple sclerosis ([place])    Narcotic dependence ([place])    hx of benzodiazepine and narcotic   Osteoporosis    Positive H. pylori test    Urinary incontinence     Past Surgical History:  Procedure Laterality Date   BACK SURGERY  11-2009   LOWER BACK   BOTOX INJECTION N/A 03/21/2016   Procedure: BOTOX INJECTION;  Surgeon: Wilford Corner, MD;  Location: [facility];  Service: Endoscopy;  Laterality: N/A;   CARDIAC CATHETERIZATION     UNSUCCESSFUL STENT PLACEMENT   COLONSCOPY     ESOPHAGEAL MANOMETRY N/A 10/23/2015   Procedure: ESOPHAGEAL MANOMETRY (EM);  Surgeon: Wilford Corner, MD;  Location: [facility];  Service: Endoscopy;  Laterality: N/A;   ESOPHAGOGASTRODUODENOSCOPY (EGD) WITH PROPOFOL N/A 03/21/2016   Procedure: ESOPHAGOGASTRODUODENOSCOPY (EGD) WITH PROPOFOL;  Surgeon: Wilford Corner, MD;  Location: [facility];  Service: Endoscopy;  Laterality: N/A;   RIGHT/LEFT HEART CATH AND CORONARY ANGIOGRAPHY N/A 10/02/2017   Procedure: RIGHT/LEFT HEART CATH AND CORONARY ANGIOGRAPHY;  Surgeon: Nelva Bush, MD;  Location: [place] [facility] [facility];  Service: Cardiovascular;  Laterality: N/A;   TUBAL LIGATION     VIDEO BRONCHOSCOPY Bilateral 05/18/2019   Procedure: VIDEO BRONCHOSCOPY WITHOUT FLUORO;  Surgeon: Brand Males, MD;  Location: [facility] ENDOSCOPY;  Service: Endoscopy;  Laterality: Bilateral;  Current Medications: No outpatient medications have been marked as taking for the 01/15/21 encounter (Appointment) with Imogene Burn, PA-C.     Allergies:   Contrast media [iodinated diagnostic agents], Crestor [rosuvastatin], Sulfonamide derivatives, Actonel [risedronate], Doxycycline, and Parathyroid hormone (recomb)   Social History   Socioeconomic History   Marital status: Divorced    Spouse name: Not on file   Number of children: 2   Years of education: 12th   Highest education level: 12th  grade  Occupational History   Occupation: disability    Employer: RETIRED  Tobacco Use   Smoking status: Former    Packs/day: 1.00    Years: 48.00    Pack years: 48.00    Types: Cigarettes    Quit date: 11/16/2009    Years since quitting: 11.1   Smokeless tobacco: Never  Vaping Use   Vaping Use: Never used  Substance and Sexual Activity   Alcohol use: No   Drug use: No   Sexual activity: Not Currently  Other Topics Concern   Not on file  Social History Narrative   Patient lives at home alone. - [place], [place]   Caffeine Use: rarely   Retired - Used to be a Dance movement psychotherapist; owned a Company secretary   Married; 2 children; 1 granddaughter   Social Determinants of Sales executive: Low Risk    Difficulty of Paying Living Expenses: Not hard at [employer] Insecurity: No Food Insecurity   Worried About Charity fundraiser in the Last Year: Never true   Arboriculturist in the Last Year: Never true  Transportation Needs: No Data processing manager (Medical): No   Lack of Transportation (Non-Medical): No  Physical Activity: Inactive   Days of Exercise per Week: 0 days   Minutes of Exercise per Session: 0 min  Stress: Stress Concern Present   Feeling of Stress : Very much  Social Connections: Moderately Isolated   Frequency of Communication with Friends and Family: Twice a week   Frequency of Social Gatherings with Friends and Family: Once a week   Attends Religious Services: 1 to 4 times per year   Active Member of [employer] or Organizations: No   Attends Music therapist: Never   Marital Status: Divorced     Family History:  The patient's ***family history includes Cancer in her brother; Heart attack (age of onset: 74) in her father; Heart attack (age of onset: 48) in her mother.   ROS:   Please see the history of present illness.    ROS All other systems reviewed and are negative.   PHYSICAL EXAM:   VS:  There were no  vitals taken for this visit.  Physical Exam  GEN: Well nourished, well developed, in no acute distress  HEENT: normal  Neck: no JVD, carotid bruits, or masses Cardiac:RRR; no murmurs, rubs, or gallops  Respiratory:  clear to auscultation bilaterally, normal work of breathing GI: soft, nontender, nondistended, + BS Ext: without cyanosis, clubbing, or edema, Good distal pulses bilaterally MS: no deformity or atrophy  Skin: warm and dry, no rash Neuro:  Alert and Oriented x 3, Strength and sensation are intact Psych: euthymic mood, full affect  Wt Readings from Last 3 Encounters:  12/11/20 123 lb 9.6 oz (56.1 kg)  10/23/20 121 lb (54.9 kg)  08/24/20 116 lb 9.6 oz (52.9 kg)      Studies/Labs Reviewed:   EKG:  EKG is*** ordered  today.  The ekg ordered today demonstrates ***  Recent Labs: No results found for requested labs within last 8760 hours.   Lipid Panel    Component Value Date/Time   CHOL 201 (H) 05/09/2015 0555   TRIG 104 05/09/2015 0555   HDL 53 05/09/2015 0555   CHOLHDL 3.8 05/09/2015 0555   VLDL 21 05/09/2015 0555   LDLCALC 127 (H) 05/09/2015 0555    Additional studies/ records that were reviewed today include:  NST 01/31/2020 There was no ST segment deviation noted during stress. Findings consistent with prior septal myocardial infarction. Small apical infarct with mild peri-infarct ischemia. This is a low risk study. The left ventricular ejection fraction is normal (55-65%).     Risk Assessment/Calculations:   {Does this patient have ATRIAL FIBRILLATION?[phone]}     ASSESSMENT:    No diagnosis found.   PLAN:  In order of problems listed above:  CAD cath 10/2017 40% proximal LAD, 60% OM1, patent RCA overall mild to moderate nonobstructive disease stable since 2012 cath, NST 01/2020 septal infarct small apical infarct with mild peri-infarct ischemia  Hypertension  Hyperlipidemia  Chronic LBBB  COPD followed by pulmonary  Shared Decision  Making/Informed Consent   {Are you ordering a CV Procedure (e.g. stress test, cath, DCCV, TEE, etc)?   Press F2        :[id number]}    Medication Adjustments/Labs and Tests Ordered: Current medicines are reviewed at length with the patient today.  Concerns regarding medicines are outlined above.  Medication changes, Labs and Tests ordered today are listed in the Patient Instructions below. There are no Patient Instructions on file for this visit.   Sumner Boast, PA-C  01/03/2021 3:08 PM    [facility], [place], [place]  [address] Phone: [phone]; Fax: [phone]

## 2021-01-09 ENCOUNTER — Telehealth: Payer: Self-pay | Admitting: Internal Medicine

## 2021-01-09 NOTE — Telephone Encounter (Signed)
ATC x1, no answer, LVM to return call.

## 2021-01-10 ENCOUNTER — Other Ambulatory Visit: Payer: Self-pay | Admitting: Cardiology

## 2021-01-10 ENCOUNTER — Telehealth: Payer: Self-pay | Admitting: Cardiology

## 2021-01-10 MED ORDER — CEPHALEXIN 500 MG PO CAPS: 500.0000 mg | ORAL_CAPSULE | Freq: Three times a day (TID) | ORAL | 0 refills | Status: DC

## 2021-01-10 MED ORDER — METOPROLOL SUCCINATE ER 25 MG PO TB24: 12.5000 mg | ORAL_TABLET | Freq: Every day | ORAL | 1 refills | Status: DC

## 2021-01-10 NOTE — Telephone Encounter (Signed)
  She was bronchec in dec 2020 and that tiime had some staph - MSSA. Her current CT shows fluctuating infiltrates. In absence fever etc., is hard to just give abx esp when the CT was n April and visit was in July  If she is having pain now - hard to figure out but can try an antibiotic now    Allergies  Allergen Reactions   Contrast Media [Iodinated Diagnostic Agents] Rash and Other (See Comments)    Severe rash   Crestor [Rosuvastatin] Hives and Other (See Comments)    welps   Sulfonamide Derivatives Hives    As a child   Actonel [Risedronate]     Other reaction(s): chest pain   Doxycycline    Parathyroid Hormone (Recomb)     Other reaction(s): hair loss or weakness       xxxxxxxxxxxxxxxxxxxxxxxxxxxxxxxxxxxxxxxxxxxx IMPRESSION: 1. Stable dominant subpleural nodule posteriorly in the left upper lobe from 2014, consistent with a benign finding. 2. Fluctuating tree in bud and ground-glass nodularity in the right upper lobe, slightly worse compared with most recent study. 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 3.  Coronary and aortic Atherosclerosis (ICD10-I70.0).     Electronically Signed   By: Richardean Sale M.D.   On: 09/01/2020 20:44

## 2021-01-10 NOTE — Telephone Encounter (Signed)
Pt Toprol was not d/c, dose was decreased. Looks like medication was taken out of chart but never changed and put back. Medication has been re-added at the correct dose.

## 2021-01-10 NOTE — Telephone Encounter (Signed)
Do cephalexin 500mg  three times daily x  5 days    Allergies  Allergen Reactions   Contrast Media [Iodinated Diagnostic Agents] Rash and Other (See Comments)    Severe rash   Crestor [Rosuvastatin] Hives and Other (See Comments)    welps   Sulfonamide Derivatives Hives    As a child   Actonel [Risedronate]     Other reaction(s): chest pain   Doxycycline    Parathyroid Hormone (Recomb)     Other reaction(s): hair loss or weakness

## 2021-01-10 NOTE — Telephone Encounter (Signed)
Called and spoke with Patient.  Dr. Golden Pop recommendations given.  Understanding stated.  Patient requested cephalexin prescription be sent to [facility].  Nothing further at this time.

## 2021-01-10 NOTE — Telephone Encounter (Signed)
Called and spoke with pt letting her know the info stated by MR and she verbalized understanding.  Pt said in regards to an abx being sent in, she said whatever MR thought was best is what she was okay with. Pt said that the pain she is having is in her ribs. States that it is not just from coughing as she does not cough that much, and when she does cough, phlegm is clear.  Pt said that she does see a pain specialist who has prescribed percocet for the pain that she takes when needed.  Routing back to MR.

## 2021-01-10 NOTE — Telephone Encounter (Signed)
Spoke with the pt  She states that she has been having pain her ribs "for months"- prescribed oxycodone for this (Dr Hardin Negus) and it's not helping  She also c/o chest tightness over the past 2-3 days, and cough with white sputum  She is not having any increased SOB, wheezing, fever, aches  She states that she is taking her anoro daily and uses neb with atrovent about 4 x per day  She has not used her albuterol inhaler recently  She states not tested for covid and has not received any covid vaccines and has no interest in this  She also wants MR's interpretation of her CT Chest from April 2022  She says that [facility] told her last ov that it showed lung infection, but she doesn't understand why no abx given   MR, please advise on what to do for symptoms and on ct scan, thanks

## 2021-01-10 NOTE — Telephone Encounter (Signed)
Patient is wanting to know if she needs to continue being on Toprol. [phone]

## 2021-01-12 ENCOUNTER — Other Ambulatory Visit: Payer: Self-pay | Admitting: *Deleted
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
## 2021-01-13 ENCOUNTER — Other Ambulatory Visit: Payer: Self-pay | Admitting: Cardiology

## 2021-01-15 ENCOUNTER — Encounter: Payer: Self-pay | Admitting: Physician Assistant

## 2021-01-15 ENCOUNTER — Other Ambulatory Visit: Payer: Self-pay

## 2021-01-15 ENCOUNTER — Telehealth: Payer: Self-pay | Admitting: Physician Assistant

## 2021-01-15 ENCOUNTER — Telehealth (INDEPENDENT_AMBULATORY_CARE_PROVIDER_SITE_OTHER): Payer: Medicare Other | Admitting: Physician Assistant

## 2021-01-15 VITALS — BP 130/70 | Ht 61.0 in | Wt 116.0 lb

## 2021-01-15 DIAGNOSIS — I10 Essential (primary) hypertension: Secondary | ICD-10-CM | POA: Diagnosis not present

## 2021-01-15 DIAGNOSIS — E785 Hyperlipidemia, unspecified: Secondary | ICD-10-CM | POA: Diagnosis not present

## 2021-01-15 DIAGNOSIS — I2510 Atherosclerotic heart disease of native coronary artery without angina pectoris: Secondary | ICD-10-CM | POA: Diagnosis not present

## 2021-01-15 MED ORDER — METOPROLOL SUCCINATE ER 25 MG PO TB24: 12.5000 mg | ORAL_TABLET | Freq: Every day | ORAL | 3 refills | Status: DC

## 2021-01-15 NOTE — Telephone Encounter (Signed)
  Patient Consent for Virtual Visit        [name] has provided verbal consent on 01/15/2021 for a virtual visit (video or telephone).   CONSENT FOR VIRTUAL VISIT FOR:  [name]  By participating in this virtual visit I agree to the following:  I hereby voluntarily request, consent and authorize [facility] and its employed or contracted physicians, physician assistants, nurse practitioners or other licensed health care professionals (the Practitioner), to provide me with telemedicine health care services (the "Services") as deemed necessary by the treating Practitioner. I acknowledge and consent to receive the Services by the Practitioner via telemedicine. I understand that the telemedicine visit will involve communicating with the Practitioner through live audiovisual communication technology and the disclosure of certain medical information by electronic transmission. I acknowledge that I have been given the opportunity to request an in-person assessment or other available alternative prior to the telemedicine visit and am voluntarily participating in the telemedicine visit.  I understand that I have the right to withhold or withdraw my consent to the use of telemedicine in the course of my care at any time, without affecting my right to future care or treatment, and that the Practitioner or I may terminate the telemedicine visit at any time. I understand that I have the right to inspect all information obtained and/or recorded in the course of the telemedicine visit and may receive copies of available information for a reasonable fee.  I understand that some of the potential risks of receiving the Services via telemedicine include:  Delay or interruption in medical evaluation due to technological equipment failure or disruption; Information transmitted may not be sufficient (e.g. poor resolution of images) to allow for appropriate medical decision making by the Practitioner;  and/or  In rare instances, security protocols could fail, causing a breach of personal health information.  Furthermore, I acknowledge that it is my responsibility to provide information about my medical history, conditions and care that is complete and accurate to the best of my ability. I acknowledge that Practitioner's advice, recommendations, and/or decision may be based on factors not within their control, such as incomplete or inaccurate data provided by me or distortions of diagnostic images or specimens that may result from electronic transmissions. I understand that the practice of medicine is not an exact science and that Practitioner makes no warranties or guarantees regarding treatment outcomes. I acknowledge that a copy of this consent can be made available to me via my patient portal ([facility]), or I can request a printed copy by calling the office of [facility].    I understand that my insurance will be billed for this visit.   I have read or had this consent read to me. I understand the contents of this consent, which adequately explains the benefits and risks of the Services being provided via telemedicine.  I have been provided ample opportunity to ask questions regarding this consent and the Services and have had my questions answered to my satisfaction. I give my informed consent for the services to be provided through the use of telemedicine in my medical care

## 2021-01-15 NOTE — Patient Instructions (Signed)

## 2021-01-15 NOTE — Progress Notes (Signed)
Virtual Visit via Telephone Note   This visit type was conducted due to national recommendations for restrictions regarding the COVID-19 Pandemic (e.g. social distancing) in an effort to limit this patient's exposure and mitigate transmission in our community.  Due to her co-morbid illnesses, this patient is at least at moderate risk for complications without adequate follow up.  This format is felt to be most appropriate for this patient at this time.  The patient did not have access to video technology/had technical difficulties with video requiring transitioning to audio format only (telephone).  All issues noted in this document were discussed and addressed.  No physical exam could be performed with this format.  Please refer to the patient's chart for her  consent to telehealth for [facility].    Date:  01/15/2021   ID:  [name], DOB 06/10/1945, MRN [id number] The patient was identified using 2 identifiers.  Patient Location: Home Provider Location: Office/Clinic   PCP:  Donald Prose, MD   [facility] Providers Cardiologist:  Carlyle Dolly, MD     Evaluation Performed:  Follow-Up Visit  Chief Complaint:  follow up  History of Present Illness:    [name] is a 75 y.o. female with  with history of CAD cath 10/2017 40% proximal LAD, 60% OM1, patent RCA overall mild to moderate nonobstructive disease stable since 2012 cath, NST 01/2020 septal infarct small apical infarct with mild peri-infarct ischemia, low risk, hypertension, HLD, chronic LBBB, COPD  Patient saw Dr. Harl Bowie 10/2020 and was doing well.  Patient's visit switched to virtual because of worsening of chronic pain.  Was walking in a field behind her house and she got tired and had some neck pain relieved with 1 NTG. First time in a long time she's had to use it. She walks her dog twice a day without chest/neck pain.    The patient does not have symptoms concerning for COVID-19 infection (fever,  chills, cough, or new shortness of breath).    Past Medical History:  Diagnosis Date   Anxiety    Aortic atherosclerosis ([place]) 09/29/2017   High Res CT in 9/18: aortic atherosclerosis; coronary artery calcification   Cervical disc disease    BULGING   Chronic combined systolic and diastolic CHF (congestive heart failure) ([place]) 09/29/2017   Echo 7/18: Moderate concentric LVH, grade 1 diastolic dysfunction, abnormal septal wall motion due to LBBB, moderate diffuse HK, EF 35-40, atrial septal aneurysm with possible PFO, mild TR // Echo 5/19: EF 50-55, normal wall motion, grade 1 diastolic dysfunction, trivial TR   Complication of anesthesia    SLOW TO AWAKEN AFTER LAST COLONSCOPY   Coronary artery disease 05/09/2015   LHC 10/12: EF 55, OM1 80-90 - difficult to engage >> treated medically // Nuc 8/18: EF 40, no ischemia   Depression    DJD (degenerative joint disease)    OA   Dysrhythmia    Not clear where this diagnosis came from   Emphysema of lung ([place])    Dr. Chase Caller   Fibromyalgia    H/O: liver disease 42 YRS AGO   tranamanitis due to previous interferon - LFTs improved off of   History of MI (myocardial infarction) FEW YRS AGO   History of TIA (transient ischemic attack)    Hx of colonic polyps    Hyperlipidemia    intol to statin - myalgias // ESPERION trial - patient stopped   Multiple sclerosis ([place])    Narcotic dependence ([place])  hx of benzodiazepine and narcotic   Osteoporosis    Positive H. pylori test    Urinary incontinence    Past Surgical History:  Procedure Laterality Date   BACK SURGERY  11-2009   LOWER BACK   BOTOX INJECTION N/A 03/21/2016   Procedure: BOTOX INJECTION;  Surgeon: Wilford Corner, MD;  Location: [facility] END[facility];  Service: Endoscopy;  Laterality: N/A;   CARDIAC CATHETERIZATION     UNSUCCESSFUL STENT PLACEMENT   COLONSCOPY     ESOPHAGEAL MANOMETRY N/A 10/23/2015   Procedure: ESOPHAGEAL MANOMETRY (EM);  Surgeon: Wilford Corner, MD;  Location:  [facility]OS[facility];  Service: Endoscopy;  Laterality: N/A;   ESOPHAGOGASTRODUODENOSCOPY (EGD) WITH PROPOFOL N/A 03/21/2016   Procedure: ESOPHAGOGASTRODUODENOSCOPY (EGD) WITH PROPOFOL;  Surgeon: Wilford Corner, MD;  Location: [facility];  Service: Endoscopy;  Laterality: N/A;   RIGHT/LEFT HEART CATH AND CORONARY ANGIOGRAPHY N/A 10/02/2017   Procedure: RIGHT/LEFT HEART CATH AND CORONARY ANGIOGRAPHY;  Surgeon: Nelva Bush, MD;  Location: [facility];  Service: Cardiovascular;  Laterality: N/A;   TUBAL LIGATION     VIDEO BRONCHOSCOPY Bilateral 05/18/2019   Procedure: VIDEO BRONCHOSCOPY WITHOUT FLUORO;  Surgeon: Brand Males, MD;  Location: [facility] ENDOSCOPY;  Service: Endoscopy;  Laterality: Bilateral;     Current Meds  Medication Sig   albuterol (VENTOLIN HFA) 108 (90 Base) MCG/ACT inhaler INHALE 2 PUFFS INTO THE LUNGS EVERY 6 HOURS AS NEEDED FOR WHEEZING OR SHORTNESS OF BREATH   Ascorbic Acid (VITAMIN C) POWD Take 1,000 mg by mouth daily. Energizer packet   aspirin EC 81 MG tablet Take 324 mg by mouth daily.    CALCIUM PO Take 1 tablet by mouth daily.   cephALEXin (KEFLEX) 500 MG capsule Take 1 capsule (500 mg total) by mouth 3 (three) times daily.   Cholecalciferol (VITAMIN D3) 125 MCG (5000 UT) CAPS Take 5,000 Units by mouth daily.    clonazePAM (KLONOPIN) 1 MG tablet Take 1 mg by mouth 3 (three) times daily as needed for anxiety.    COENZYME Q10 PO Take 1 capsule by mouth daily.    doxepin (SINEQUAN) 25 MG capsule Take 1 capsule (25 mg total) by mouth at bedtime.   fluticasone (FLONASE) 50 MCG/ACT nasal spray Place 2 sprays into both nostrils daily as needed for rhinitis.   gabapentin (NEURONTIN) 100 MG capsule Take 100 mg by mouth daily as needed (Pain).    gabapentin (NEURONTIN) 300 MG capsule TAKE 1 CAPSULE BY MOUTH DAILY   GLATOPA 20 MG/ML SOSY injection USE 1 INJECTION SUBCUTANEOUSLY ONCE A DAY   guaiFENesin (MUCINEX) 600 MG 12 hr tablet Take 600 mg by mouth 2 (two) times daily  as needed for cough or to loosen phlegm.    Homeopathic Products (SIMILASAN DRY EYE RELIEF OP) Place 1 drop into both eyes 2 (two) times daily.   ipratropium (ATROVENT) 0.02 % nebulizer solution USE 2.5 MLS (0.5 MG TOTAL) BY NEBULIZER 4 TIMES DAILY   isosorbide mononitrate (IMDUR) 120 MG 24 hr tablet Take 2 tablets (240 mg total) by mouth daily.   losartan (COZAAR) 25 MG tablet TAKE 1 TABLET BY MOUTH TWICE A DAY   lovastatin (MEVACOR) 40 MG tablet Take 1 tablet (40 mg total) by mouth at bedtime.   metoprolol succinate (TOPROL-XL) 25 MG 24 hr tablet Take 0.5 tablets (12.5 mg total) by mouth daily.   modafinil (PROVIGIL) 200 MG tablet Take 1 tablet (200 mg total) by mouth 2 (two) times daily.   mometasone (NASONEX) 50 MCG/ACT nasal spray SPRAY 2 SPRAYS  INTO THE NOSE DAILY   Multiple Vitamin (MULTIVITAMIN WITH MINERALS) TABS tablet Take 1 tablet by mouth daily.   nitroGLYCERIN (NITROSTAT) 0.4 MG SL tablet DISSOLVE 1 TABLET UNDER TONGUE AS NEEDEDFOR CHEST PAIN. MAY REPEAT 5 MINUTES APART 3 TIMES IF NEEDED   NUCYNTA ER 50 MG TB12 Take 50 mg by mouth every 12 (twelve) hours.    Omega-3 Fatty Acids (OMEGA 3 PO) Take 1 capsule by mouth daily.   omeprazole (PRILOSEC) 20 MG capsule TAKE 1 CAPSULE BY MOUTH ONCE DAILY   oxyCODONE-acetaminophen (PERCOCET) 5-325 MG per tablet Take 1 tablet by mouth every 4 (four) hours as needed for moderate pain or severe pain.   polyethylene glycol powder (GLYCOLAX/MIRALAX) powder Take 17 g by mouth daily as needed for mild constipation or moderate constipation.    PRISTIQ 50 MG 24 hr tablet Take 50 mg by mouth every evening.    umeclidinium-vilanterol (ANORO ELLIPTA) 62.5-25 MCG/INH AEPB Inhale 1 puff into the lungs daily.   vitamin B-12 (CYANOCOBALAMIN) 1000 MCG tablet Take 1,500 mcg by mouth daily.     Allergies:   Contrast media [iodinated diagnostic agents], Crestor [rosuvastatin], Sulfonamide derivatives, Actonel [risedronate], Doxycycline, and Parathyroid hormone  (recomb)   Social History   Tobacco Use   Smoking status: Former    Packs/day: 1.00    Years: 48.00    Pack years: 48.00    Types: Cigarettes    Quit date: 11/16/2009    Years since quitting: 11.1   Smokeless tobacco: Never  Vaping Use   Vaping Use: Never used  Substance Use Topics   Alcohol use: No   Drug use: No     Family Hx: The patient's family history includes Cancer in her brother; Heart attack (age of onset: 43) in her father; Heart attack (age of onset: 75) in her mother.  ROS:   Please see the history of present illness.     All other systems reviewed and are negative.   Prior CV studies:   The following studies were reviewed today:  NST 01/31/2020 There was no ST segment deviation noted during stress. Findings consistent with prior septal myocardial infarction. Small apical infarct with mild peri-infarct ischemia. This is a low risk study. The left ventricular ejection fraction is normal (55-65%).    Labs/Other Tests and Data Reviewed:    EKG:  No ECG reviewed.  Recent Labs: No results found for requested labs within last 8760 hours.   Recent Lipid Panel Lab Results  Component Value Date/Time   CHOL 201 (H) 05/09/2015 05:55 AM   TRIG 104 05/09/2015 05:55 AM   HDL 53 05/09/2015 05:55 AM   CHOLHDL 3.8 05/09/2015 05:55 AM   LDLCALC 127 (H) 05/09/2015 05:55 AM    Wt Readings from Last 3 Encounters:  01/15/21 116 lb (52.6 kg)  12/11/20 123 lb 9.6 oz (56.1 kg)  10/23/20 121 lb (54.9 kg)     Risk Assessment/Calculations:          Objective:    Vital Signs:  BP 130/70   Ht 5\' 1"  (1.549 m)   Wt 116 lb (52.6 kg)   BMI 21.92 kg/m    VITAL SIGNS:  reviewed  ASSESSMENT & PLAN:     CAD cath 10/2017 40% proximal LAD, 60% OM1, patent RCA overall mild to moderate nonobstructive disease stable since 2012 cath, NST 01/2020 septal infarct small apical infarct with mild peri-infarct ischemia-mild angina Friday when tired and walking in a field relieved  with NTG. Able to walk  her dog twice daily without a problem. She's on Imdur 240 mg daily. She'll let us know if she has worsening angina.  Hypertension BP controlled  Hyperlipidemia on lovastatin labs done by PCP  F/U with Dr. Harl Bowie in 6 months.  Spent 16:38 minutes  Medication Adjustments/Labs and Tests Ordered: Current medicines are reviewed at length with the patient today.  Concerns regarding medicines are outlined above.  Medication changes, Labs and Tests ordered today are listed in the Patient Instructions below. There are no Patient Instructions on file for this visit.    Sumner Boast, PA-C  01/03/2021 3:08 PM    [facility] [place], [place],   [address] Phone: [phone]; Fax: [phone]

## 2021-01-16 DIAGNOSIS — Z79891 Long term (current) use of opiate analgesic: Secondary | ICD-10-CM | POA: Diagnosis not present

## 2021-01-16 DIAGNOSIS — G894 Chronic pain syndrome: Secondary | ICD-10-CM | POA: Diagnosis not present

## 2021-01-16 DIAGNOSIS — M4722 Other spondylosis with radiculopathy, cervical region: Secondary | ICD-10-CM | POA: Diagnosis not present

## 2021-01-16 DIAGNOSIS — M4726 Other spondylosis with radiculopathy, lumbar region: Secondary | ICD-10-CM | POA: Diagnosis not present

## 2021-01-25 ENCOUNTER — Other Ambulatory Visit: Payer: Self-pay | Admitting: *Deleted

## 2021-01-25 ENCOUNTER — Encounter: Payer: Self-pay | Admitting: *Deleted
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
## 2021-02-06 NOTE — Progress Notes (Signed)
NEUROLOGY FOLLOW UP OFFICE NOTE  [name] [id number]  Assessment/Plan:   Multiple sclerosis  DMT:  glatiramer 20mg  [place] daily D3 2000 IU daily Provigil 200mg  daily Repeat MRI of brain with and without contrast in one year. Follow up after repeat MRI  Subjective:  [name] is a 75 year old right-handed female with CAD, COPD, emphysema, CHF, thyroid disease, IBS, arthritis, depression, fibromyalgia who follows up for multiple sclerosis.   UPDATE: Current DMT:  glatiramer 20mg  [place] daily Current medications:  Gabapentin 300mg  at night and 100mg  during day if needed. Clonazepam 1mg  during day and 2mg  at bedtime for spasms D3 2000 IU daily Provigil 200mg  Doxepin 25mg  QHS   Ordered MRI to be performed prior to today's follow up but she reports that a lot has been going on and couldn't do it.     Vision:  Permanent vision loss in left eye Motor:  Stable.  Chronic head tremor Sensory:  stable Pain:  Pain in arms and legs.  Lumbar radiculopathy Gait:  Sometimes requires ambulation with cane or walker.   Bowel/Bladder:  Stable Fatigue:  Yes Cognition:  Short-term memory deficits. Mood:  Depressed. She reports difficulty with speech.  She has trouble with pronouncing them.  It has been ongoing for several months but has gotten worse.     HISTORY: First symptom occurred in 1995, when she developed vision loss in the left eye, as well as left eye pain.  MRI of the brain with and without contrast was performed on 03/28/95 revealed 10-12 periventricular white matter lesions and one lesion in the corpus callosum, suspicious for demyelination.  There was also enhancement of the left optic nerve.  She was referred to Dr. Delphia Grates, an [facility] [facility] specialist.  Lumbar puncture was performed, revealing no oligoclonal bands or myelin basic protein.  She has not required hospitalization or steroids since the late 1990s.  She was on Betaseron and later Copaxone.  Copaxone was discontinued  due to longstanding stability, but due to fatigue and cognitive issues, it was restarted in 2014.  Her continued symptoms are episodes of worsening fatigue, as well as cognitive issues.  Sometimes, she has difficulty thinking clearly.  She will get disoriented while driving.  Other times, she has word-finding difficulties.  She once overpaid a bill.  She still performs all ADLs, including paying the bills.  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  Sometimes, when she feels symptoms are worse, she sees a white "half a doughnut" shape in her visual field.     Past disease-modifying agents:  Betaseron (elevated LFTs and necrotic injection sites)   For fatigue, she takes Provigil.  Adderal was ineffective.  Amantadine was ineffective. For pain in the arms and legs, she takes gabapentin 300mg  at bedtime.  She reportedly has lumbar radiculopathy which also contributes to the leg pain. Initially, she reportedly had significant spasticity.  She was on oral baclofen which did not help.  At one point, a baclofen pump was entertained.  She currently takes clonazepam 1mg  during the day and 2mg  at bedtime, which is effective.   Other imaging: 05/07/95 MRI LUMBAR WO:  mild disc bulging at L4-5 without disc herniation or central canal stenosis. 03/09/96 MRI BRAIN W/WO (comparing to MRI from 03/28/95):  slight increase in number of white matter lesions adjacent to the atrium of the right lateral ventricle, otherwise unchanged. 03/28/98 MRI BRAIN W/WO (comparing to MRI from 03/25/97):  several subtle areas of  increased signal in the deep white matter, not as prevalent as on prior study.  No abnormal enhancement. 08/11/13 MRI of brain with and without contrast :  multiple supratentorial and infratentorial T2 hyperintensities, but no post-contrast enhancement.  However, this was not compared to prior imaging. 01/28/14 MRI Cervical spine without contrast:  spinal cord unremarkable.[name] MRI of  brain with and without contrast from 08/17/14 is stable MRI of brain without contrast from 05/08/15 is stable. MRI of brain with and without contrast from 03/04/17 unchanged from prior study on 05/08/15.  PAST MEDICAL HISTORY: Past Medical History:  Diagnosis Date   Anxiety    Aortic atherosclerosis ([place]) 09/29/2017   High Res CT in 9/18: aortic atherosclerosis; coronary artery calcification   Cervical disc disease    BULGING   Chronic combined systolic and diastolic CHF (congestive heart failure) ([place]) 09/29/2017   Echo 7/18: Moderate concentric LVH, grade 1 diastolic dysfunction, abnormal septal wall motion due to LBBB, moderate diffuse HK, EF 35-40, atrial septal aneurysm with possible PFO, mild TR // Echo 5/19: EF 50-55, normal wall motion, grade 1 diastolic dysfunction, trivial TR   Complication of anesthesia    SLOW TO AWAKEN AFTER LAST COLONSCOPY   Coronary artery disease 05/09/2015   LHC 10/12: EF 55, OM1 80-90 - difficult to engage >> treated medically // Nuc 8/18: EF 40, no ischemia   Depression    DJD (degenerative joint disease)    OA   Dysrhythmia    Not clear where this diagnosis came from   Emphysema of lung ([place])    Dr. Chase Caller   Fibromyalgia    H/O: liver disease 50 YRS AGO   tranamanitis due to previous interferon - LFTs improved off of   History of MI (myocardial infarction) FEW YRS AGO   History of TIA (transient ischemic attack)    Hx of colonic polyps    Hyperlipidemia    intol to statin - myalgias // ESPERION trial - patient stopped   Multiple sclerosis ([place])    Narcotic dependence ([place])    hx of benzodiazepine and narcotic   Osteoporosis    Positive H. pylori test    Urinary incontinence     MEDICATIONS: Current Outpatient Medications on File Prior to Visit  Medication Sig Dispense Refill   albuterol (VENTOLIN HFA) 108 (90 Base) MCG/ACT inhaler INHALE 2 PUFFS INTO THE LUNGS EVERY 6 HOURS AS NEEDED FOR WHEEZING OR SHORTNESS OF BREATH 8.5 g 3   Ascorbic  Acid (VITAMIN C) POWD Take 1,000 mg by mouth daily. Energizer packet     aspirin EC 81 MG tablet Take 81 mg by mouth daily.     CALCIUM PO Take 1 tablet by mouth daily.     cephALEXin (KEFLEX) 500 MG capsule Take 1 capsule (500 mg total) by mouth 3 (three) times daily. 15 capsule 0   Cholecalciferol (VITAMIN D3) 125 MCG (5000 UT) CAPS Take 5,000 Units by mouth daily.      clonazePAM (KLONOPIN) 1 MG tablet Take 1 mg by mouth 3 (three) times daily as needed for anxiety.      COENZYME Q10 PO Take 1 capsule by mouth daily.      diphenhydrAMINE (BENADRYL) 50 MG capsule Take 50 mg by mouth at bedtime as needed for sleep. (Patient not taking: Reported on 01/15/2021)     doxepin (SINEQUAN) 25 MG capsule Take 1 capsule (25 mg total) by mouth at bedtime. 30 capsule 5   fluticasone (FLONASE) 50 MCG/ACT nasal spray Place 2  sprays into both nostrils daily as needed for rhinitis.     gabapentin (NEURONTIN) 100 MG capsule Take 100 mg by mouth daily as needed (Pain).      gabapentin (NEURONTIN) 300 MG capsule TAKE 1 CAPSULE BY MOUTH DAILY 30 capsule 5   GLATOPA 20 MG/ML SOSY injection USE 1 INJECTION SUBCUTANEOUSLY ONCE A DAY 30 mL 3   guaiFENesin (MUCINEX) 600 MG 12 hr tablet Take 600 mg by mouth 2 (two) times daily as needed for cough or to loosen phlegm.      Homeopathic Products (SIMILASAN DRY EYE RELIEF OP) Place 1 drop into both eyes 2 (two) times daily.     ipratropium (ATROVENT) 0.02 % nebulizer solution USE 2.5 MLS (0.5 MG TOTAL) BY NEBULIZER 4 TIMES DAILY 300 mL 5   isosorbide mononitrate (IMDUR) 120 MG 24 hr tablet Take 2 tablets (240 mg total) by mouth daily. 180 tablet 3   losartan (COZAAR) 25 MG tablet TAKE 1 TABLET BY MOUTH TWICE A DAY 180 tablet 3   lovastatin (MEVACOR) 40 MG tablet Take 1 tablet (40 mg total) by mouth at bedtime. 90 tablet 3   metoprolol succinate (TOPROL-XL) 25 MG 24 hr tablet Take 0.5 tablets (12.5 mg total) by mouth daily. 45 tablet 3   modafinil (PROVIGIL) 200 MG tablet Take  1 tablet (200 mg total) by mouth 2 (two) times daily. 60 tablet 2   mometasone (NASONEX) 50 MCG/ACT nasal spray SPRAY 2 SPRAYS INTO THE NOSE DAILY 17 g 5   Multiple Vitamin (MULTIVITAMIN WITH MINERALS) TABS tablet Take 1 tablet by mouth daily.     nitroGLYCERIN (NITROSTAT) 0.4 MG SL tablet DISSOLVE 1 TABLET UNDER TONGUE AS NEEDEDFOR CHEST PAIN. MAY REPEAT 5 MINUTES APART 3 TIMES IF NEEDED 25 tablet 3   NUCYNTA ER 50 MG TB12 Take 50 mg by mouth every 12 (twelve) hours.      Omega-3 Fatty Acids (OMEGA 3 PO) Take 1 capsule by mouth daily.     omeprazole (PRILOSEC) 20 MG capsule TAKE 1 CAPSULE BY MOUTH ONCE DAILY 30 capsule 5   oxyCODONE-acetaminophen (PERCOCET) 5-325 MG per tablet Take 1 tablet by mouth every 4 (four) hours as needed for moderate pain or severe pain.     polyethylene glycol powder (GLYCOLAX/MIRALAX) powder Take 17 g by mouth daily as needed for mild constipation or moderate constipation.      PRISTIQ 50 MG 24 hr tablet Take 50 mg by mouth every evening.      torsemide (DEMADEX) 20 MG tablet TAKE 1 TABLET BY MOUTH AS NEEDED FOR SWELLING AND WEIGHT GAIN (Patient not taking: Reported on 01/15/2021) 90 tablet 2   umeclidinium-vilanterol (ANORO ELLIPTA) 62.5-25 MCG/INH AEPB Inhale 1 puff into the lungs daily. 60 each 11   vitamin B-12 (CYANOCOBALAMIN) 1000 MCG tablet Take 1,500 mcg by mouth daily.     No current facility-administered medications on file prior to visit.    ALLERGIES: Allergies  Allergen Reactions   Contrast Media [Iodinated Diagnostic Agents] Rash and Other (See Comments)    Severe rash   Crestor [Rosuvastatin] Hives and Other (See Comments)    welps   Sulfonamide Derivatives Hives    As a child   Actonel [Risedronate]     Other reaction(s): chest pain   Doxycycline    Parathyroid Hormone (Recomb)     Other reaction(s): hair loss or weakness    FAMILY HISTORY: Family History  Problem Relation Age of Onset   Heart attack Mother 31   Heart  attack Father 26    Cancer Brother       Objective:  Blood pressure 90/60, pulse (!) 116, height 5\' 2"  (1.575 m), weight 119 lb (54 kg), SpO2 92 %. General: No acute distress.  Patient appears well-groomed.   Head:  Normocephalic/atraumatic Eyes:  Fundi examined but not visualized Neck: supple, no paraspinal tenderness, full range of motion Heart:  Regular rate and rhythm Lungs:  Clear to auscultation bilaterally Back: No paraspinal tenderness Neurological Exam: alert and oriented to person, place, and time. Speech fluent and not dysarthric, language intact.  Left monocular vision loss.  Otherwise, CN II-XII intact. Bulk and tone normal, muscle strength 5/5 throughout.  Head tremor.  Sensation to light touch, temperature and vibration intact.  Deep tendon reflexes 3+ throughout, toes downgoing.  Finger to nose and heel to shin testing intact.  Gait steady; Romberg negative.     Metta Clines, DO  CC: Donald Prose, MD

## 2021-02-08 ENCOUNTER — Encounter: Payer: Self-pay | Admitting: Neurology

## 2021-02-08 ENCOUNTER — Ambulatory Visit: Payer: Medicare Other | Admitting: Neurology

## 2021-02-08 ENCOUNTER — Other Ambulatory Visit: Payer: Self-pay

## 2021-02-08 ENCOUNTER — Other Ambulatory Visit: Payer: Self-pay | Admitting: Neurology

## 2021-02-08 DIAGNOSIS — G35 Multiple sclerosis: Secondary | ICD-10-CM

## 2021-02-08 MED ORDER — MODAFINIL 200 MG PO TABS: 200.0000 mg | ORAL_TABLET | Freq: Two times a day (BID) | ORAL | 2 refills | Status: DC

## 2021-02-08 NOTE — Patient Instructions (Signed)
MRI of brain with and without contrast in one year Follow up one year (after repeat MRI)

## 2021-02-14 DIAGNOSIS — M4726 Other spondylosis with radiculopathy, lumbar region: Secondary | ICD-10-CM | POA: Diagnosis not present

## 2021-02-14 DIAGNOSIS — M4722 Other spondylosis with radiculopathy, cervical region: Secondary | ICD-10-CM | POA: Diagnosis not present

## 2021-02-14 DIAGNOSIS — G894 Chronic pain syndrome: Secondary | ICD-10-CM | POA: Diagnosis not present

## 2021-02-14 DIAGNOSIS — Z79891 Long term (current) use of opiate analgesic: Secondary | ICD-10-CM | POA: Diagnosis not present

## 2021-02-19 ENCOUNTER — Telehealth: Payer: Self-pay | Admitting: Cardiology

## 2021-02-19 MED ORDER — ISOSORBIDE MONONITRATE ER 120 MG PO TB24: 240.0000 mg | ORAL_TABLET | Freq: Every day | ORAL | 3 refills | Status: DC

## 2021-02-19 NOTE — Telephone Encounter (Signed)
Patient would like a 90 day refill called in to the [facility] on [address].

## 2021-02-19 NOTE — Telephone Encounter (Signed)
Called patient to clarify what drug she needed refilled. Imdur 240 mg qd,#90 to [facility] [address]

## 2021-02-22 ENCOUNTER — Telehealth: Payer: Self-pay | Admitting: Internal Medicine

## 2021-02-22 NOTE — Telephone Encounter (Signed)
Spoke with the pt  She is c/o left side pain in her ribs, had a fall a few days prior  She states pain gets worse on inspiration and it comes and goes  She feels better when she applies some pressure  She is not sure if this is a result of fall  She feels that she is slightly more SOB than usual She denies any cough, wheezing, f/c.s, body aches, sore throat, HA  Wants appt and MR has 9:45 am tomorrow. Aware to arrive at 9:30 Advised her to seek emergent care sooner if symptoms persist/worsen

## 2021-02-22 NOTE — Telephone Encounter (Signed)
Pt states she is having extreme left side pain. Patient not sure if she has broken ribs, she is SOB with this pain.Pt wanting to possibly get an x ray.Has had "assisted fall" States pain feels a little better putting pressure on it.Please advise. [phone]

## 2021-02-23 ENCOUNTER — Encounter: Payer: Self-pay | Admitting: Internal Medicine

## 2021-02-23 ENCOUNTER — Ambulatory Visit (INDEPENDENT_AMBULATORY_CARE_PROVIDER_SITE_OTHER): Payer: Medicare Other

## 2021-02-23 ENCOUNTER — Ambulatory Visit: Payer: Medicare Other | Admitting: Internal Medicine

## 2021-02-23 ENCOUNTER — Other Ambulatory Visit: Payer: Self-pay

## 2021-02-23 VITALS — BP 108/64 | HR 94 | Temp 97.6°F | Ht 62.0 in | Wt 118.4 lb

## 2021-02-23 DIAGNOSIS — I700 Atherosclerosis of aorta: Secondary | ICD-10-CM | POA: Diagnosis not present

## 2021-02-23 DIAGNOSIS — R0789 Other chest pain: Secondary | ICD-10-CM | POA: Diagnosis not present
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
## 2021-02-23 NOTE — Patient Instructions (Addendum)
Chest wall pain -    -Associated with small bruising in the left anterior third rib space, left infra axillary space and left deltoid laterally.  Each of this is around 1 cm in size and looks yellowish  -This is probably due to fall  Plan - Get chest x-ray to rule out any fracture - Continue pre-existing pain medications - This is not pulmonary issue related and trauma related issues to be addressed by primary care doctor in the future  Pulmonary emphysema ([place])  - stable  plan - Continue anoro daily scheduled and do nebulizer as needed  - compliance important   Pulmonary nodules - small left upper lobe and right lower lobe   CT scan feb 2021 shows stable left upper lobe nodule sinsce 2014 and improved RLL nodules after antibiotic doxy  But in Aprol 2022 though LUL nodule stable some mild increase in ground glass opacticities RUL   Plan   = repeat ct chest without contrast - - change from Oct 2022 to NOv/dec 2022  Followup -  with app but after CT

## 2021-03-01 ENCOUNTER — Telehealth: Payer: Self-pay | Admitting: Internal Medicine

## 2021-03-01 NOTE — Telephone Encounter (Signed)
Called and spoke with Patient.  Patient stated she is still having chest wall pain and would like to know if cleaning her lungs again would help?  Patient stated if cleaning her lungs again will help with chest wall pain she would like to go forward with that. Patient also requested cxr results.  Cxr results given and understanding stated.   CXR results -  Brand Males, MD  02/26/2021 12:31 PM EDT     Delsa Sale, Please let patient know it is normal result    Message routed to Dr. Chase Caller to advise

## 2021-03-08 ENCOUNTER — Other Ambulatory Visit: Payer: Self-pay | Admitting: Cardiology

## 2021-03-08 NOTE — Telephone Encounter (Signed)
I can always do another bronchoscopy but I do not think that will help her.  Her last CT scan was in April 2022 her most recent chest x-rays in September 2022.  If the lungs continue to hurt then we might have to consider another CT scan of the chest.  At this point in time I recommend a visit with nurse practitioner video visit or face-to-face to discuss

## 2021-03-08 NOTE — Telephone Encounter (Signed)
Left message for patient to call back  

## 2021-03-12 ENCOUNTER — Ambulatory Visit: Payer: Medicare Other | Admitting: Internal Medicine

## 2021-03-14 DIAGNOSIS — G4700 Insomnia, unspecified: Secondary | ICD-10-CM | POA: Diagnosis not present

## 2021-03-14 DIAGNOSIS — I5042 Chronic combined systolic (congestive) and diastolic (congestive) heart failure: Secondary | ICD-10-CM | POA: Diagnosis not present

## 2021-03-14 DIAGNOSIS — J439 Emphysema, unspecified: Secondary | ICD-10-CM | POA: Diagnosis not present

## 2021-03-14 DIAGNOSIS — M4726 Other spondylosis with radiculopathy, lumbar region: Secondary | ICD-10-CM | POA: Diagnosis not present

## 2021-03-14 DIAGNOSIS — G894 Chronic pain syndrome: Secondary | ICD-10-CM | POA: Diagnosis not present

## 2021-03-14 DIAGNOSIS — K219 Gastro-esophageal reflux disease without esophagitis: Secondary | ICD-10-CM | POA: Diagnosis not present

## 2021-03-14 DIAGNOSIS — G8929 Other chronic pain: Secondary | ICD-10-CM | POA: Diagnosis not present

## 2021-03-14 DIAGNOSIS — M810 Age-related osteoporosis without current pathological fracture: Secondary | ICD-10-CM | POA: Diagnosis not present

## 2021-03-14 DIAGNOSIS — M4722 Other spondylosis with radiculopathy, cervical region: Secondary | ICD-10-CM | POA: Diagnosis not present

## 2021-03-14 DIAGNOSIS — I10 Essential (primary) hypertension: Secondary | ICD-10-CM | POA: Diagnosis not present

## 2021-03-14 DIAGNOSIS — Z79891 Long term (current) use of opiate analgesic: Secondary | ICD-10-CM | POA: Diagnosis not present

## 2021-03-14 DIAGNOSIS — I25118 Atherosclerotic heart disease of native coronary artery with other forms of angina pectoris: Secondary | ICD-10-CM | POA: Diagnosis not present

## 2021-03-14 DIAGNOSIS — I2510 Atherosclerotic heart disease of native coronary artery without angina pectoris: Secondary | ICD-10-CM | POA: Diagnosis not present

## 2021-03-14 DIAGNOSIS — E782 Mixed hyperlipidemia: Secondary | ICD-10-CM | POA: Diagnosis not present

## 2021-03-19 ENCOUNTER — Other Ambulatory Visit: Payer: Medicare Other

## 2021-04-09 DIAGNOSIS — J439 Emphysema, unspecified: Secondary | ICD-10-CM | POA: Diagnosis not present

## 2021-04-09 DIAGNOSIS — J449 Chronic obstructive pulmonary disease, unspecified: Secondary | ICD-10-CM | POA: Diagnosis not present

## 2021-04-09 DIAGNOSIS — E782 Mixed hyperlipidemia: Secondary | ICD-10-CM | POA: Diagnosis not present

## 2021-04-09 DIAGNOSIS — M810 Age-related osteoporosis without current pathological fracture: Secondary | ICD-10-CM | POA: Diagnosis not present

## 2021-04-09 DIAGNOSIS — I10 Essential (primary) hypertension: Secondary | ICD-10-CM | POA: Diagnosis not present

## 2021-04-09 DIAGNOSIS — G8929 Other chronic pain: Secondary | ICD-10-CM | POA: Diagnosis not present

## 2021-04-09 DIAGNOSIS — G4700 Insomnia, unspecified: Secondary | ICD-10-CM | POA: Diagnosis not present

## 2021-04-09 DIAGNOSIS — K219 Gastro-esophageal reflux disease without esophagitis: Secondary | ICD-10-CM | POA: Diagnosis not present

## 2021-04-12 DIAGNOSIS — M4726 Other spondylosis with radiculopathy, lumbar region: Secondary | ICD-10-CM | POA: Diagnosis not present

## 2021-04-12 DIAGNOSIS — Z79891 Long term (current) use of opiate analgesic: Secondary | ICD-10-CM | POA: Diagnosis not present

## 2021-04-12 DIAGNOSIS — M4722 Other spondylosis with radiculopathy, cervical region: Secondary | ICD-10-CM | POA: Diagnosis not present

## 2021-04-12 DIAGNOSIS — G894 Chronic pain syndrome: Secondary | ICD-10-CM | POA: Diagnosis not present

## 2021-04-20 ENCOUNTER — Ambulatory Visit (INDEPENDENT_AMBULATORY_CARE_PROVIDER_SITE_OTHER)
Admission: RE | Admit: 2021-04-20 | Discharge: 2021-04-20 | Disposition: A | Discharge status: Home / Self Care | Payer: Medicare Other | Source: Ambulatory Visit | Attending: Internal Medicine | Admitting: Internal Medicine

## 2021-04-20 ENCOUNTER — Other Ambulatory Visit: Payer: Self-pay

## 2021-04-20 DIAGNOSIS — R911 Solitary pulmonary nodule: Secondary | ICD-10-CM | POA: Diagnosis not present

## 2021-04-20 DIAGNOSIS — J439 Emphysema, unspecified: Secondary | ICD-10-CM | POA: Diagnosis not present

## 2021-04-20 DIAGNOSIS — R918 Other nonspecific abnormal finding of lung field: Secondary | ICD-10-CM | POA: Diagnosis not present

## 2021-04-20 DIAGNOSIS — J479 Bronchiectasis, uncomplicated: Secondary | ICD-10-CM | POA: Diagnosis not present

## 2021-04-20 DIAGNOSIS — I700 Atherosclerosis of aorta: Secondary | ICD-10-CM | POA: Diagnosis not present

## 2021-04-23 ENCOUNTER — Telehealth: Payer: Self-pay | Admitting: Internal Medicine

## 2021-04-23 NOTE — Telephone Encounter (Signed)
I have called and LM on VM for pt to call us back.  

## 2021-04-24 NOTE — Telephone Encounter (Signed)
Called and spoke with pt who states she is still having SOB. Stated that she will further discuss this with MR at upcoming appt 11/28. Nothing further needed.

## 2021-04-30 ENCOUNTER — Encounter: Payer: Self-pay | Admitting: Internal Medicine

## 2021-04-30 ENCOUNTER — Telehealth: Payer: Self-pay | Admitting: Neurology

## 2021-04-30 ENCOUNTER — Ambulatory Visit: Payer: Medicare Other | Admitting: Internal Medicine

## 2021-04-30 ENCOUNTER — Other Ambulatory Visit: Payer: Self-pay

## 2021-04-30 VITALS — BP 114/72 | HR 97 | Temp 98.1°F | Ht 61.0 in | Wt 115.2 lb

## 2021-04-30 DIAGNOSIS — R0789 Other chest pain: Secondary | ICD-10-CM

## 2021-04-30 DIAGNOSIS — J438 Other emphysema: Secondary | ICD-10-CM

## 2021-04-30 DIAGNOSIS — R296 Repeated falls: Secondary | ICD-10-CM

## 2021-04-30 DIAGNOSIS — R911 Solitary pulmonary nodule: Secondary | ICD-10-CM | POA: Diagnosis not present

## 2021-04-30 NOTE — Telephone Encounter (Signed)
Per Pt she knows why she is taking the Provigil 200 mg. The insurance wanted to know she it can be approved.   Please advise once PA is done.

## 2021-04-30 NOTE — Patient Instructions (Addendum)
Chest wall pain -    -glad better.  probably due to falls and rib fracture  Plan - Continue pre-existing pain medications - This is not pulmonary issue related and trauma related issues to be addressed by primary care doctor in the future  Frequent falls and hand laceration   Plan  - please talk to PCP Donald Prose, MD   Pulmonary emphysema ([place])  - stable  plan - Continue anoro daily scheduled and do nebulizer as needed  - compliance important   Pulmonary nodules - small left upper lobe and right lower lobe   CT scan feb 2021 shows stable left upper lobe nodule sinsce 2014 and improved RLL nodules after antibiotic doxy  But in Aprol 2022 though LUL nodule stable some mild increase in ground glass opacticities RUL -> staable Nov 2022   Plan - expectant followup on CT   Followup -  6-9 months or sooner if needed

## 2021-04-30 NOTE — Telephone Encounter (Signed)
Patient called and said she is needing a PA on her Provigil 200 MG.   She said she'd also like a call back about why she is taking the medicine.  Cc: Jari Sportsman
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
## 2021-05-01 ENCOUNTER — Telehealth: Payer: Self-pay

## 2021-05-01 NOTE — Telephone Encounter (Signed)
F/u   Carmela Rima Key: [id number] - PA Case ID: [id number] Need help? Call us at [phone] Status Sent to [facility] Provigil 200MG  tablets Form [employer] Medicare Part D Electronic Prior Authorization Form (2017 NCPDP)  [employer] is reviewing your PA request. Typically an electronic response will be received within 24-72 hours. To check for an update later, open this request from your dashboard.  You may close this dialog and return to your dashboard to perform other tasks.

## 2021-05-01 NOTE — Telephone Encounter (Signed)
New message   Your information has been sent to [employer].  Carmela Rima Key: [id number] - PA Case ID: [id number] Need help? Call us at [phone] Status Sent to [employer] Provigil 200MG  tablets Form [employer] Medicare Part D Electronic Prior Authorization Form (2017 NCPDP)

## 2021-05-01 NOTE — Telephone Encounter (Signed)
F/u  [name] Key: [id number] - PA Case ID: [id number] Need help? Call us at [phone] Outcome Approvedtoday Request Reference Number: [id number]. PROVIGIL TAB 200MG  is approved through 12/01/2021. Your patient may now fill this prescription and it will be covered. Drug Provigil 200MG  tablets Form [employer] Medicare Part D Electronic Prior Authorization Form (2017 NCPDP)

## 2021-05-03 ENCOUNTER — Other Ambulatory Visit: Payer: Self-pay | Admitting: Neurology

## 2021-05-03 ENCOUNTER — Telehealth: Payer: Self-pay | Admitting: Cardiology

## 2021-05-03 DIAGNOSIS — G35 Multiple sclerosis: Secondary | ICD-10-CM

## 2021-05-03 NOTE — Telephone Encounter (Signed)
Patient called and left a voice mail requesting a call back with an update on the PA.

## 2021-05-03 NOTE — Telephone Encounter (Signed)
Patient wanted to talk to Dr. Harl Bowie about her medicines. She has an appt to see Dr. Harl Bowie in February but would like to talk to him sooner if possible

## 2021-05-03 NOTE — Telephone Encounter (Signed)
F/u   Carmela Rima Key: [id number] - PA Case ID: [id number] Need help? Call us at [phone] Outcome Approvedon November 29 Request Reference Number: [id number]. PROVIGIL TAB 200MG  is approved through 12/01/2021. Your patient may now fill this prescription and it will be covered. Drug Provigil 200MG  tablets Form [employer] Medicare Part D Electronic Prior Authorization Form (2017 NCPDP)

## 2021-05-04 NOTE — Telephone Encounter (Signed)
Patient called to check on the status of her request for refills on Provigil 200 MG.

## 2021-05-07 ENCOUNTER — Other Ambulatory Visit: Payer: Self-pay | Admitting: Neurology

## 2021-05-07 ENCOUNTER — Telehealth: Payer: Self-pay | Admitting: Internal Medicine

## 2021-05-07 ENCOUNTER — Other Ambulatory Visit: Payer: Self-pay | Admitting: Internal Medicine

## 2021-05-07 DIAGNOSIS — G35 Multiple sclerosis: Secondary | ICD-10-CM

## 2021-05-07 DIAGNOSIS — Z79891 Long term (current) use of opiate analgesic: Secondary | ICD-10-CM | POA: Diagnosis not present

## 2021-05-07 DIAGNOSIS — G894 Chronic pain syndrome: Secondary | ICD-10-CM | POA: Diagnosis not present

## 2021-05-07 DIAGNOSIS — M810 Age-related osteoporosis without current pathological fracture: Secondary | ICD-10-CM | POA: Diagnosis not present

## 2021-05-07 DIAGNOSIS — S2232XA Fracture of one rib, left side, initial encounter for closed fracture: Secondary | ICD-10-CM | POA: Diagnosis not present

## 2021-05-07 DIAGNOSIS — M4726 Other spondylosis with radiculopathy, lumbar region: Secondary | ICD-10-CM | POA: Diagnosis not present

## 2021-05-07 DIAGNOSIS — M4722 Other spondylosis with radiculopathy, cervical region: Secondary | ICD-10-CM | POA: Diagnosis not present

## 2021-05-07 MED ORDER — ALBUTEROL SULFATE HFA 108 (90 BASE) MCG/ACT IN AERS: 2.0000 | INHALATION_SPRAY | Freq: Four times a day (QID) | RESPIRATORY_TRACT | 3 refills | Status: DC | PRN

## 2021-05-07 MED ORDER — ISOSORBIDE MONONITRATE ER 120 MG PO TB24: 240.0000 mg | ORAL_TABLET | Freq: Every day | ORAL | 3 refills | Status: DC

## 2021-05-07 MED ORDER — TORSEMIDE 20 MG PO TABS: ORAL_TABLET | ORAL | 2 refills | Status: DC

## 2021-05-07 MED ORDER — IPRATROPIUM BROMIDE 0.02 % IN SOLN: RESPIRATORY_TRACT | 5 refills | Status: DC

## 2021-05-07 MED ORDER — METOPROLOL SUCCINATE ER 25 MG PO TB24
12.5000 mg | ORAL_TABLET | Freq: Every day | ORAL | 3 refills | Status: DC
Start: 2021-05-07 — End: 2022-03-05

## 2021-05-07 MED ORDER — MODAFINIL 200 MG PO TABS
200.0000 mg | ORAL_TABLET | Freq: Two times a day (BID) | ORAL | 2 refills | Status: DC
Start: 2021-05-07 — End: 2021-07-30

## 2021-05-07 MED ORDER — ANORO ELLIPTA 62.5-25 MCG/ACT IN AEPB: 1.0000 | INHALATION_SPRAY | Freq: Every day | RESPIRATORY_TRACT | 6 refills | Status: DC

## 2021-05-07 MED ORDER — LOSARTAN POTASSIUM 25 MG PO TABS: ORAL_TABLET | ORAL | 3 refills | Status: DC

## 2021-05-07 NOTE — Telephone Encounter (Signed)
Refills have been sent to the pharmacy per pts request.   

## 2021-05-07 NOTE — Telephone Encounter (Signed)
Spoke with pt who request refills be sent on Imdur, Toprol XL, Torsemide and Losartan. Refills sent to [facility].

## 2021-05-08 ENCOUNTER — Telehealth: Payer: Self-pay | Admitting: Internal Medicine

## 2021-05-09 ENCOUNTER — Other Ambulatory Visit: Payer: Self-pay | Admitting: Family Medicine

## 2021-05-09 DIAGNOSIS — M810 Age-related osteoporosis without current pathological fracture: Secondary | ICD-10-CM

## 2021-05-09 NOTE — Telephone Encounter (Signed)
?   What meds???    I called the pt and there was no answer- LMTCB

## 2021-05-21 DIAGNOSIS — E782 Mixed hyperlipidemia: Secondary | ICD-10-CM | POA: Diagnosis not present

## 2021-05-21 DIAGNOSIS — K219 Gastro-esophageal reflux disease without esophagitis: Secondary | ICD-10-CM | POA: Diagnosis not present

## 2021-05-21 DIAGNOSIS — J449 Chronic obstructive pulmonary disease, unspecified: Secondary | ICD-10-CM | POA: Diagnosis not present

## 2021-05-21 DIAGNOSIS — M810 Age-related osteoporosis without current pathological fracture: Secondary | ICD-10-CM | POA: Diagnosis not present

## 2021-05-21 DIAGNOSIS — I5042 Chronic combined systolic (congestive) and diastolic (congestive) heart failure: Secondary | ICD-10-CM | POA: Diagnosis not present

## 2021-05-21 DIAGNOSIS — I2510 Atherosclerotic heart disease of native coronary artery without angina pectoris: Secondary | ICD-10-CM | POA: Diagnosis not present

## 2021-05-21 DIAGNOSIS — G8929 Other chronic pain: Secondary | ICD-10-CM | POA: Diagnosis not present

## 2021-05-21 DIAGNOSIS — G4700 Insomnia, unspecified: Secondary | ICD-10-CM | POA: Diagnosis not present

## 2021-05-21 DIAGNOSIS — I10 Essential (primary) hypertension: Secondary | ICD-10-CM | POA: Diagnosis not present

## 2021-05-21 DIAGNOSIS — I25118 Atherosclerotic heart disease of native coronary artery with other forms of angina pectoris: Secondary | ICD-10-CM | POA: Diagnosis not present

## 2021-05-21 DIAGNOSIS — J439 Emphysema, unspecified: Secondary | ICD-10-CM | POA: Diagnosis not present

## 2021-05-22 NOTE — Telephone Encounter (Signed)
A refill request for pt's anoro was received and this was taken care of 12/5.

## 2021-05-31 ENCOUNTER — Other Ambulatory Visit: Payer: Self-pay | Admitting: Internal Medicine

## 2021-06-02 ENCOUNTER — Other Ambulatory Visit: Payer: Self-pay | Admitting: Internal Medicine

## 2021-06-04 DIAGNOSIS — M4726 Other spondylosis with radiculopathy, lumbar region: Secondary | ICD-10-CM | POA: Diagnosis not present

## 2021-06-04 DIAGNOSIS — G894 Chronic pain syndrome: Secondary | ICD-10-CM | POA: Diagnosis not present

## 2021-06-04 DIAGNOSIS — M4722 Other spondylosis with radiculopathy, cervical region: Secondary | ICD-10-CM | POA: Diagnosis not present

## 2021-06-04 DIAGNOSIS — Z79891 Long term (current) use of opiate analgesic: Secondary | ICD-10-CM | POA: Diagnosis not present

## 2021-06-07 DIAGNOSIS — L03113 Cellulitis of right upper limb: Secondary | ICD-10-CM | POA: Diagnosis not present

## 2021-06-19 ENCOUNTER — Telehealth: Payer: Self-pay | Admitting: Internal Medicine

## 2021-06-19 DIAGNOSIS — J438 Other emphysema: Secondary | ICD-10-CM

## 2021-06-19 DIAGNOSIS — R918 Other nonspecific abnormal finding of lung field: Secondary | ICD-10-CM

## 2021-06-20 NOTE — Telephone Encounter (Signed)
Spoke with the pt  She c/o cough with white sputum for the past several wks  She is not having any wheezing, increased SOB, chest tightness  She denies fevers or aches and feeling well overall  She states she is using her neb 2-3 x per day, anoro daily, and has tried delsym  Delsym helps some but she wants to try something else   She says that years ago she was prescribed "these tiny capsules" that really helped the cough  I think she meant tessalon  MR- please advised if we can send this in  Pt okay to wait until MR returns 06/21/21

## 2021-06-21 MED ORDER — PREDNISONE 10 MG PO TABS: ORAL_TABLET | ORAL | 0 refills | Status: AC

## 2021-06-21 MED ORDER — BENZONATATE 200 MG PO CAPS: 200.0000 mg | ORAL_CAPSULE | Freq: Three times a day (TID) | ORAL | 3 refills | Status: DC | PRN

## 2021-06-21 NOTE — Telephone Encounter (Signed)
Called and spoke with patient and let her know the medication changes that the Dr has ordered and confirmed the pharmacy. Nothing else needed at this time

## 2021-06-21 NOTE — Telephone Encounter (Signed)
Teassolon cough perles 200mg  tid prn x 30 days with 3 refills   Please take prednisone 40 mg x1 day, then 30 mg x1 day, then 20 mg x1 day, then 10 mg x1 day, and then 5 mg x1 day and stop

## 2021-06-21 NOTE — Telephone Encounter (Signed)
Okay to do Tessalon cough was 20 mg 3 times daily.  Regarding worsening cough and white sputum she could try - Present on Admit     Allergies  Allergen Reactions   Contrast Media [Iodinated Contrast Media] Rash and Other (See Comments)    Severe rash   Crestor [Rosuvastatin] Hives and Other (See Comments)    welps   Sulfonamide Derivatives Hives    As a child   Actonel [Risedronate]     Other reaction(s): chest pain   Doxycycline    Parathyroid Hormone (Recomb)     Other reaction(s): hair loss or weakness

## 2021-06-30 ENCOUNTER — Other Ambulatory Visit: Payer: Self-pay | Admitting: Neurology

## 2021-06-30 DIAGNOSIS — G35 Multiple sclerosis: Secondary | ICD-10-CM

## 2021-07-03 DIAGNOSIS — M4722 Other spondylosis with radiculopathy, cervical region: Secondary | ICD-10-CM | POA: Diagnosis not present

## 2021-07-03 DIAGNOSIS — Z79891 Long term (current) use of opiate analgesic: Secondary | ICD-10-CM | POA: Diagnosis not present

## 2021-07-03 DIAGNOSIS — G894 Chronic pain syndrome: Secondary | ICD-10-CM | POA: Diagnosis not present

## 2021-07-03 DIAGNOSIS — M4726 Other spondylosis with radiculopathy, lumbar region: Secondary | ICD-10-CM | POA: Diagnosis not present

## 2021-07-04 ENCOUNTER — Telehealth: Payer: Self-pay | Admitting: Cardiology
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
## 2021-07-04 NOTE — Telephone Encounter (Signed)
Patient would like to talk to the Dr. Harl Bowie because she think she mad have had a stroke. Please advise

## 2021-07-05 NOTE — Telephone Encounter (Signed)
Facial swelling, facial droop would need to be assessed by pcp. If they feel anything cardiac is an issue we could evaluate at that time  Zandra Abts MD

## 2021-07-06 ENCOUNTER — Ambulatory Visit: Payer: Medicare Other | Admitting: Cardiology

## 2021-07-06 NOTE — Telephone Encounter (Signed)
Patient notified and verbalized understanding. Pt had no other questions or concerns.

## 2021-07-13 ENCOUNTER — Telehealth: Payer: Self-pay | Admitting: Cardiology

## 2021-07-13 NOTE — Telephone Encounter (Signed)
Attempted to reach patient at home & mobile number - both states vm not set up.

## 2021-07-13 NOTE — Telephone Encounter (Signed)
Pt c/o medication issue:  1. Name of Medication:  isosorbide mononitrate (IMDUR) 120 MG 24 hr tablet  2. How are you currently taking this medication (dosage and times per day)? Hasn't taken since yesterday morning   3. Are you having a reaction (difficulty breathing--STAT)? No   4. What is your medication issue? Patient is calling stating she has lost this bottle of medication. She is requesting the office contact the pharmacy so she can pick up a new bottle with insurance still covering it.

## 2021-07-13 NOTE — Telephone Encounter (Signed)
Spoke to pt who stated that the pharmacy is going to give her medication to replace the lost bottle of isosorbide mononitrate. Informed pt that we can send in replacement, but insurance will not cover medication since it would be too early to file a claim with the insurance company at this time.

## 2021-07-30 ENCOUNTER — Other Ambulatory Visit: Payer: Self-pay | Admitting: Neurology

## 2021-07-30 DIAGNOSIS — G35 Multiple sclerosis: Secondary | ICD-10-CM

## 2021-08-01 DIAGNOSIS — M4726 Other spondylosis with radiculopathy, lumbar region: Secondary | ICD-10-CM | POA: Diagnosis not present

## 2021-08-01 DIAGNOSIS — G894 Chronic pain syndrome: Secondary | ICD-10-CM | POA: Diagnosis not present

## 2021-08-01 DIAGNOSIS — M4722 Other spondylosis with radiculopathy, cervical region: Secondary | ICD-10-CM | POA: Diagnosis not present

## 2021-08-01 DIAGNOSIS — Z79891 Long term (current) use of opiate analgesic: Secondary | ICD-10-CM | POA: Diagnosis not present

## 2021-08-10 DIAGNOSIS — J439 Emphysema, unspecified: Secondary | ICD-10-CM | POA: Diagnosis not present

## 2021-08-10 DIAGNOSIS — I2510 Atherosclerotic heart disease of native coronary artery without angina pectoris: Secondary | ICD-10-CM | POA: Diagnosis not present

## 2021-08-10 DIAGNOSIS — E782 Mixed hyperlipidemia: Secondary | ICD-10-CM | POA: Diagnosis not present

## 2021-08-10 DIAGNOSIS — G35 Multiple sclerosis: Secondary | ICD-10-CM | POA: Diagnosis not present

## 2021-08-10 DIAGNOSIS — G8929 Other chronic pain: Secondary | ICD-10-CM | POA: Diagnosis not present

## 2021-08-10 DIAGNOSIS — Z0000 Encounter for general adult medical examination without abnormal findings: Secondary | ICD-10-CM | POA: Diagnosis not present

## 2021-08-10 DIAGNOSIS — I10 Essential (primary) hypertension: Secondary | ICD-10-CM | POA: Diagnosis not present

## 2021-08-10 DIAGNOSIS — I5042 Chronic combined systolic (congestive) and diastolic (congestive) heart failure: Secondary | ICD-10-CM | POA: Diagnosis not present

## 2021-08-10 DIAGNOSIS — M810 Age-related osteoporosis without current pathological fracture: Secondary | ICD-10-CM | POA: Diagnosis not present

## 2021-08-10 DIAGNOSIS — I700 Atherosclerosis of aorta: Secondary | ICD-10-CM | POA: Diagnosis not present
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
## 2021-08-29 DIAGNOSIS — M4726 Other spondylosis with radiculopathy, lumbar region: Secondary | ICD-10-CM | POA: Diagnosis not present

## 2021-08-29 DIAGNOSIS — G894 Chronic pain syndrome: Secondary | ICD-10-CM | POA: Diagnosis not present

## 2021-08-29 DIAGNOSIS — M4722 Other spondylosis with radiculopathy, cervical region: Secondary | ICD-10-CM | POA: Diagnosis not present

## 2021-08-29 DIAGNOSIS — Z79891 Long term (current) use of opiate analgesic: Secondary | ICD-10-CM | POA: Diagnosis not present

## 2021-09-04 ENCOUNTER — Ambulatory Visit: Payer: Medicare Other | Admitting: Physician Assistant

## 2021-09-04 ENCOUNTER — Other Ambulatory Visit (HOSPITAL_COMMUNITY)
Admission: RE | Admit: 2021-09-04 | Discharge: 2021-09-04 | Disposition: A | Discharge status: Home / Self Care | Payer: Medicare Other | Source: Ambulatory Visit | Attending: Physician Assistant | Admitting: Physician Assistant

## 2021-09-04 ENCOUNTER — Telehealth: Payer: Self-pay | Admitting: Physician Assistant

## 2021-09-04 ENCOUNTER — Encounter: Payer: Self-pay | Admitting: Physician Assistant

## 2021-09-04 VITALS — BP 114/68 | HR 99 | Ht 61.0 in | Wt 111.0 lb

## 2021-09-04 DIAGNOSIS — T7491XA Unspecified adult maltreatment, confirmed, initial encounter: Secondary | ICD-10-CM | POA: Diagnosis not present

## 2021-09-04 DIAGNOSIS — E785 Hyperlipidemia, unspecified: Secondary | ICD-10-CM | POA: Diagnosis not present

## 2021-09-04 DIAGNOSIS — I10 Essential (primary) hypertension: Secondary | ICD-10-CM | POA: Diagnosis not present

## 2021-09-04 DIAGNOSIS — I2510 Atherosclerotic heart disease of native coronary artery without angina pectoris: Secondary | ICD-10-CM | POA: Diagnosis not present

## 2021-09-04 LAB — CBC
HCT: 36.4 % (ref 36.0–46.0)
Hemoglobin: 12.5 g/dL (ref 12.0–15.0)
MCH: 30.5 pg (ref 26.0–34.0)
MCHC: 34.3 g/dL (ref 30.0–36.0)
MCV: 88.8 fL (ref 80.0–100.0)
Platelets: 261 10*3/uL (ref 150–400)
RBC: 4.1 MIL/uL (ref 3.87–5.11)
RDW: 11.9 % (ref 11.5–15.5)
WBC: 7.1 10*3/uL (ref 4.0–10.5)
nRBC: 0 % (ref 0.0–0.2)

## 2021-09-04 LAB — BASIC METABOLIC PANEL
Anion gap: 11 (ref 5–15)
BUN: 21 mg/dL (ref 8–23)
CO2: 27 mmol/L (ref 22–32)
Calcium: 9.2 mg/dL (ref 8.9–10.3)
Chloride: 92 mmol/L — ABNORMAL LOW (ref 98–111)
Creatinine, Ser: 1 mg/dL (ref 0.44–1.00)
GFR, Estimated: 58 mL/min — ABNORMAL LOW (ref >60)
Glucose, Bld: 95 mg/dL (ref 70–99)
Potassium: 3.2 mmol/L — ABNORMAL LOW (ref 3.5–5.1)
Sodium: 130 mmol/L — ABNORMAL LOW (ref 135–145)

## 2021-09-04 NOTE — Patient Instructions (Signed)
Medication Instructions:  ?Your physician recommends that you continue on your current medications as directed. Please refer to the Current Medication list given to you today. ? ?*If you need a refill on your cardiac medications before your next appointment, please call your pharmacy* ? ? ?Lab Work: ?BMET, CBC today ?If you have labs (blood work) drawn today and your tests are completely normal, you will receive your results only by: ?MyChart Message (if you have MyChart) OR ?A paper copy in the mail ?If you have any lab test that is abnormal or we need to change your treatment, we will call you to review the results. ? ? ?Testing/Procedures: ?None today  ? ? ?Follow-Up: ?At [facility], you and your health needs are our priority.  As part of our continuing mission to provide you with exceptional heart care, we have created designated Provider Care Teams.  These Care Teams include your primary Cardiologist (physician) and Advanced Practice Providers (APPs -  Physician Assistants and Nurse Practitioners) who all work together to provide you with the care you need, when you need it. ? ?We recommend signing up for the patient portal called "MyChart".  Sign up information is provided on this After Visit Summary.  MyChart is used to connect with patients for Virtual Visits (Telemedicine).  Patients are able to view lab/test results, encounter notes, upcoming appointments, etc.  Non-urgent messages can be sent to your provider as well.   ?To learn more about what you can do with MyChart, go to [email or web address].   ? ?Your next appointment:   ?6 month(s) ? ?The format for your next appointment:   ?In Person ? ?Provider:   ?You may see Carlyle Dolly, MD or one of the following Advanced Practice Providers on your designated Care Team:   ?Bernerd Pho, PA-C  ?Ermalinda Barrios, PA-C   ?  ?

## 2021-09-04 NOTE — Telephone Encounter (Signed)
Nira Conn is returning Michele's call in regards to patient.  ?

## 2021-09-11 DIAGNOSIS — I10 Essential (primary) hypertension: Secondary | ICD-10-CM | POA: Diagnosis not present

## 2021-09-11 DIAGNOSIS — J439 Emphysema, unspecified: Secondary | ICD-10-CM | POA: Diagnosis not present

## 2021-09-11 DIAGNOSIS — G8929 Other chronic pain: Secondary | ICD-10-CM | POA: Diagnosis not present

## 2021-09-11 DIAGNOSIS — E782 Mixed hyperlipidemia: Secondary | ICD-10-CM | POA: Diagnosis not present

## 2021-09-11 DIAGNOSIS — I5042 Chronic combined systolic (congestive) and diastolic (congestive) heart failure: Secondary | ICD-10-CM | POA: Diagnosis not present

## 2021-09-13 ENCOUNTER — Telehealth: Payer: Self-pay

## 2021-09-13 MED ORDER — POTASSIUM CHLORIDE CRYS ER 20 MEQ PO TBCR: EXTENDED_RELEASE_TABLET | ORAL | 0 refills | Status: AC

## 2021-09-13 NOTE — Telephone Encounter (Signed)
-----   Message from Arnoldo Lenis, MD sent at 09/12/2021  3:32 PM EDT ----- ?Potassium low, take potassium chloride 35mEq daily x 4 days.  ? ?Carlyle Dolly MD ?

## 2021-09-13 NOTE — Telephone Encounter (Signed)
Patient notified of lab results and will take potassium 40 meq daily for 4 days, request to be sent to [place] ?

## 2021-09-18 ENCOUNTER — Telehealth: Payer: Self-pay | Admitting: Internal Medicine

## 2021-09-18 NOTE — Telephone Encounter (Signed)
Called and spoke with patient to let her know to go to the ED for further evaluation. She expressed understanding. Nothing further needed at this time. ?

## 2021-09-18 NOTE — Telephone Encounter (Signed)
Spoke with pt  ?She states she fell twice last wk ?She states that the first time she was knocked unconscious and the second time she bruised side of her head  ?She states since falling her "lungs hurt" and she thinks that she bruised some ribs  ?She states that she does not want to go to the hospital  ?She was upset that I suggested this may be needed  ?She wants to come here for cxr  ?We do not have any openings today with any provider  ?Please advise, thanks! ?

## 2021-09-18 NOTE — Telephone Encounter (Signed)
She needs to go to ED. Concern she hit her head and lost consciousness. Likely needs CT of head and chest

## 2021-09-22 DIAGNOSIS — S299XXA Unspecified injury of thorax, initial encounter: Secondary | ICD-10-CM | POA: Diagnosis not present

## 2021-09-26 DIAGNOSIS — Z79891 Long term (current) use of opiate analgesic: Secondary | ICD-10-CM | POA: Diagnosis not present

## 2021-09-26 DIAGNOSIS — M4726 Other spondylosis with radiculopathy, lumbar region: Secondary | ICD-10-CM | POA: Diagnosis not present

## 2021-09-26 DIAGNOSIS — G894 Chronic pain syndrome: Secondary | ICD-10-CM | POA: Diagnosis not present

## 2021-09-26 DIAGNOSIS — M4722 Other spondylosis with radiculopathy, cervical region: Secondary | ICD-10-CM | POA: Diagnosis not present

## 2021-10-09 ENCOUNTER — Ambulatory Visit: Payer: Medicare Other | Admitting: Internal Medicine

## 2021-10-09 ENCOUNTER — Encounter: Payer: Self-pay | Admitting: Internal Medicine

## 2021-10-09 VITALS — BP 120/74 | HR 104 | Temp 98.3°F | Ht 61.0 in | Wt 111.8 lb

## 2021-10-09 DIAGNOSIS — R0789 Other chest pain: Secondary | ICD-10-CM | POA: Diagnosis not present

## 2021-10-09 DIAGNOSIS — J438 Other emphysema: Secondary | ICD-10-CM

## 2021-10-09 MED ORDER — PREDNISONE 10 MG PO TABS: ORAL_TABLET | ORAL | 0 refills | Status: DC

## 2021-10-09 MED ORDER — DOXYCYCLINE HYCLATE 100 MG PO TABS: 100.0000 mg | ORAL_TABLET | Freq: Two times a day (BID) | ORAL | 0 refills | Status: DC

## 2021-10-09 MED ORDER — PREDNISONE 10 MG PO TABS: ORAL_TABLET | ORAL | 0 refills | Status: AC

## 2021-10-09 NOTE — Progress Notes (Addendum)
? ? ? ?12/01/2017 Follow up : Emphysema  ?Patient returns for a 12-month follow-up.  Patient was seen last visit.  She was started on ipratropium nebulizers twice daily.  Says that this has really helped her breathing.  She feels less short of breath.  She has been able to be more active. ?Feels that this is the best she is done in a long time.  She is actually able to get out and do things.  She is very happy with this.  She is now able to walk her dog. ?PFT today showed normal lung function with FEV1 at 109%, ratio 82, FVC 100%, DLCO 87%. ? ?OV 05/14/2018 ? ?Subjective:  ?Patient ID: [name], female , DOB: [birth year] , age 76 y.o. , MRN: [id number] , ADDRESS: [address] ?[place] [id number] ? ? ?05/14/2018 -   ?Chief Complaint  ?Patient presents with  ? Follow-up  ?  pt reports of sob with exertion & chest discomfort.  ? ? ?+ ?HPI ?[name] 76 y.o. -presents for follow-up of emphysema associated with bilateral lower lobe crackles.  No evidence of ILD on September 2018 CT chest.  Overall she is stable.  COPD CAT score is only 10.  She is on some kind of a bronchodilator regimen and she is not fully compliant with it.  She says she will step up her compliance.  She says she is up-to-date with the flu shot but we do not have a date on this.  She says she is depressed because it is Christmas time.  She also thinks her cardiologist gave her a bad prognosis.  She does not know what the diseases. ? ? ? ? ? ?Patient ID: CALISTA CRAIN, female    DOB: February 24, 1946, 76 y.o.   MRN: [id number] ? ?Chief Complaint  ?Patient presents with  ? Chest Pain  ?  Left sided ribs have been hurting for the past few days. Thinks she may have cracked a rib with a recent cold.   ? ? ?Referring provider: ?Donald Prose, MD ? ?HPI: ?76 year old female, former smoker quit 2011(48 pack year hx). PMH COPD, fibromyalgia, MS, heart failure. Patient of Dr. Chase Caller, last see by NP on 06/02/18. CXR showed no evidence of PNA or CHF.  Some chronic bronchitic changes related to smoking. HRCT in 2018 showed no significant findings except for emphysema.  ? ?07/14/2018 ?Patient presents today for acute visit with continued complaints of left pleuritic pain. Reports that she had a bad cough which has slowly improved. Left rib cage is sore to touch and skin burns. No rash. Takes percocet for chronic pain/fibromyalgia, unsure if it helps. LFTs were normal. Ordered for CT abd but patient cancelled d/t cough. She has not been taking incruse inhaler or Atrovent nebulizer as often as it is prescribed. EKG today showed SR with left bundle branch block, unchanged from previous in May 2019. Denies shortness of breath.  ? ? ?OV 08/04/2018 ? ?Subjective:  ?Patient ID: [name], female , DOB: 1946/03/20 , age 25 y.o. , MRN: [id number] , ADDRESS: [address] ?[place] [id number] ? ? ?08/04/2018 -   ?Chief Complaint  ?Patient presents with  ? Follow-up  ?  Pt states she is still taking abx after recent OV with Derl Barrow, NP. Pt denies any current complaints of SOB but states she has had an occ cough. Pt states she still has been having problems with pain in her chest.  ? ? ? ?  HPI ?GEARLDEAN LOMANTO 75 y.o. -presents for follow-up.  She has COPD and fibromyalgia.  She is a Designer, jewellery recently and had multitude of complaints including pleuritic chest pain on the left side.  This resulted in a CT scan of the chest and also autoimmune work-up which were negative.  The CT scan shows right upper lobe scattered pulmonary nodules are extremely small.  This on the contralateral side to the pain.  Today she tells me that the left-sided pleuritic pain is resolved.  She says since that she is having mood issues.  She also is constantly shaking her head which she says is a chronic intermittent issue.  Last visit nurse practitioner gave her Incruse inhaler but she has no idea that this was even dispensed to her prescribed for her.  She thinks our office felt  short in sending the prescription to the pharmacy.  Instead she is using albuterol as needed.  COPD CAT score is minimal and symptom score is documented below. ? ? ? ? ? ? ?IMPRESSION: ?Interval development of cluster of small nodules seen in the lateral ?portion of right upper lobe concerning for atypical infection such ?as mycobacterium, or chronic sequela of previous such infection. ?Clinical correlation is recommended. ?  ?Stable pleural-based irregular density noted in left lung apex most ?consistent with benign scarring. ?  ?Minimal bilateral posterior basilar subsegmental atelectasis or ?scarring is noted. ?  ?Mild coronary artery calcifications are noted. ?  ?Aortic Atherosclerosis (ICD10-I70.0). ?  ?  ?Electronically Signed ?  By: Marijo Conception, M.D. ?  On: 07/15/2018 16:05 ?  ? ?Results for JATZIRI, GOFFREDO (MRN [id number]) as of 08/04/2018 12:46 ? Ref. Range 07/17/2018 12:08 07/17/2018 12:08  ?Anti Nuclear Antibody(ANA) Latest Ref Range: Negative  NEGATIVE Negative  ?ANA Titer 1 Unknown  Negative  ?Cyclic Citrullin Peptide Ab Latest Units: UNITS <16   ?dsDNA Ab Latest Ref Range: 0 - 9 IU/mL  <1  ?ENA RNP Ab Latest Ref Range: 0.0 - 0.9 AI  <0.2  ?ENA SSA (RO) Ab Latest Ref Range: 0.0 - 0.9 AI  <0.2  ?ENA SSB (LA) Ab Latest Ref Range: 0.0 - 0.9 AI  <0.2  ?Myeloperoxidase Abs Latest Units: AI <1.0   ?Serine Protease 3 Latest Units: AI <1.0   ?RA Latex Turbid. Latest Ref Range: <14 IU/mL <14   ?ENA SM Ab Ser-aCnc Latest Ref Range: 0.0 - 0.9 AI  <0.2  ?Complement C3, Serum Latest Ref Range: 82 - 167 mg/dL  117  ?SSA (Ro) (ENA) Antibody, IgG Latest Ref Range: <1.0 NEG AI <1.0 NEG   ?SSB (La) (ENA) Antibody, IgG Latest Ref Range: <1.0 NEG AI <1.0 NEG   ?Scleroderma (Scl-70) (ENA) Antibody, IgG Latest Ref Range: 0.0 - 0.9 AI <1.0 NEG 0.3  ? ? ?OV 02/01/2019 ? ?Subjective:  ?Patient ID: [name], female , DOB: Mar 01, 1946 , age 70 y.o. , MRN: [id number] , ADDRESS: [address] ?[place]  [id number] ? ? ?02/01/2019 -   ?Chief Complaint  ?Patient presents with  ? Pulmonary Emphysema  ?  Medications are working, no breathing issues.  ? ? ? ?HPI ?[name] 76 y.o. -returns for follow-up of her mild emphysema and pulmonary nodules.  She is on anticholinergic inhaler.  She tells me that overall she is actually feeling really great.  Denies any cough or sputum production or chills or weight loss when asked but answered differenty in CAT score She is deeply appreciative of our care.  She has reluctantly agreed for flu shot.  She says she does not trust the [employer].  I explained to her that the flu shots are fairly approved per the manufacturer is primary.  Explained to her the benefits, risks and limitations.  In terms of pulmonary nodules she has follow-up CT scan of the chest August 2020 that I personally visualized.  In the last 6 months she has more micronodules in the right lower lobe.  Radiologist is concerned about MAI.  I did explain this to her.  Currently because of the [place] pandemic she is nervous about coming to the hospital for bronchoscopy.  In addition she is also feeling relatively asymptomatic.  Therefore she wants to adopt an expectant approach.  Clearly if the infiltrates are getting worse again or she gets symptomatic then she is willing to come in for bronchoscopy. ? ?IMPRESSION: ?1. There is extensive, clustered centrilobular and tree-in-bud ?pulmonary nodularity bilaterally with mild associated tubular ?bronchiectasis and occasional plugging. There is diffusely new and ?increased nodularity and new and increased small consolidations, ?particularly in the dependent right lower lobe (series 5, image ?203). Findings are consistent with worsened atypical infection, ?including atypical mycobacterium. ?  ?2.  Aortic atherosclerosis and coronary artery disease. ?  ?  ?Electronically Signed ?  By: Eddie Candle M.D. ?  On: 01/22/2019 14:28 ?   ?xxxxxxxxxxxxxxxxxxxxxxxxxxxxxxxxxxxxxxxxxxxxxxxxx ?OV 03/23/2019 ? ?Subjective:  ?Patient ID: [name], female , DOB: [birth year] , age 44 y.o. , MRN: [id number] , ADDRESS: [address] ?[place] [id number] ? ? ?03/23/2019 -   ?Chief Com
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
## 2021-10-17 ENCOUNTER — Inpatient Hospital Stay: Admission: RE | Admit: 2021-10-17 | Payer: Medicare Other | Source: Ambulatory Visit

## 2021-10-18 DIAGNOSIS — G8929 Other chronic pain: Secondary | ICD-10-CM | POA: Diagnosis not present

## 2021-10-18 DIAGNOSIS — I10 Essential (primary) hypertension: Secondary | ICD-10-CM | POA: Diagnosis not present

## 2021-10-18 DIAGNOSIS — E782 Mixed hyperlipidemia: Secondary | ICD-10-CM | POA: Diagnosis not present

## 2021-10-18 DIAGNOSIS — J439 Emphysema, unspecified: Secondary | ICD-10-CM | POA: Diagnosis not present

## 2021-10-18 DIAGNOSIS — I5042 Chronic combined systolic (congestive) and diastolic (congestive) heart failure: Secondary | ICD-10-CM | POA: Diagnosis not present

## 2021-10-22 ENCOUNTER — Other Ambulatory Visit: Payer: Self-pay | Admitting: Internal Medicine

## 2021-10-24 DIAGNOSIS — M4726 Other spondylosis with radiculopathy, lumbar region: Secondary | ICD-10-CM | POA: Diagnosis not present

## 2021-10-24 DIAGNOSIS — Z79891 Long term (current) use of opiate analgesic: Secondary | ICD-10-CM | POA: Diagnosis not present

## 2021-10-24 DIAGNOSIS — G894 Chronic pain syndrome: Secondary | ICD-10-CM | POA: Diagnosis not present

## 2021-10-24 DIAGNOSIS — M4722 Other spondylosis with radiculopathy, cervical region: Secondary | ICD-10-CM | POA: Diagnosis not present

## 2021-10-30 ENCOUNTER — Telehealth: Payer: Self-pay | Admitting: Internal Medicine

## 2021-10-30 NOTE — Telephone Encounter (Signed)
Called and spoke with the pt  She is c/o increased cough past few days  Sputum is thick and white  She says had trouble swallowing pills and gets choked sometimes  She is having slight increase in SOB  Denies any fevers, aches  Appt with Beth for 4 pm tomorrow  Advised seek emergent care sooner if needed

## 2021-10-31 ENCOUNTER — Ambulatory Visit: Payer: Medicare Other | Admitting: Primary Care

## 2021-10-31 ENCOUNTER — Ambulatory Visit: Payer: Medicare Other | Admitting: Internal Medicine

## 2021-10-31 ENCOUNTER — Encounter: Payer: Self-pay | Admitting: Primary Care

## 2021-10-31 ENCOUNTER — Ambulatory Visit (INDEPENDENT_AMBULATORY_CARE_PROVIDER_SITE_OTHER): Payer: Medicare Other

## 2021-10-31 VITALS — BP 126/86 | HR 103 | Temp 98.3°F | Ht 61.0 in | Wt 113.6 lb

## 2021-10-31 DIAGNOSIS — R059 Cough, unspecified: Secondary | ICD-10-CM | POA: Diagnosis not present

## 2021-10-31 DIAGNOSIS — F419 Anxiety disorder, unspecified: Secondary | ICD-10-CM | POA: Diagnosis not present

## 2021-10-31 DIAGNOSIS — T17908A Unspecified foreign body in respiratory tract, part unspecified causing other injury, initial encounter: Secondary | ICD-10-CM

## 2021-10-31 DIAGNOSIS — F32A Depression, unspecified: Secondary | ICD-10-CM | POA: Diagnosis not present

## 2021-10-31 DIAGNOSIS — J40 Bronchitis, not specified as acute or chronic: Secondary | ICD-10-CM

## 2021-10-31 DIAGNOSIS — J209 Acute bronchitis, unspecified: Secondary | ICD-10-CM | POA: Diagnosis not present

## 2021-10-31 DIAGNOSIS — J438 Other emphysema: Secondary | ICD-10-CM | POA: Diagnosis not present

## 2021-10-31 MED ORDER — AMOXICILLIN-POT CLAVULANATE 875-125 MG PO TABS: 1.0000 | ORAL_TABLET | Freq: Two times a day (BID) | ORAL | 0 refills | Status: DC
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
## 2021-10-31 NOTE — Patient Instructions (Addendum)
Recommendations: Continue Anoro daily scheduled and do nebulizer as needed Take mucinex 600mg  twice daily Follow aspiration precautions  I need you to get 10 min of exercise a day Start with small goals- hobbies, call friend, bake, do a puzzle   Orders: CXR today  Rx: Augmentin 1 tab twice daily x 7 days   Referral: Behavioral health re;anxiety/depression Psychiatry re: anxiety/depression  Follow-up: 6 months with Dr. Chase Caller (recall should already be in)

## 2021-11-01 ENCOUNTER — Other Ambulatory Visit: Payer: Self-pay | Admitting: *Deleted

## 2021-11-01 DIAGNOSIS — J40 Bronchitis, not specified as acute or chronic: Secondary | ICD-10-CM

## 2021-11-01 NOTE — Progress Notes (Signed)
Chest x-ray showed evidence of bronchitis/interstitial pneumonia.  I have already sent patient an antibiotic.  She will need to follow-up in 2 to 4 weeks with repeat chest x-ray.

## 2021-11-12 ENCOUNTER — Telehealth: Payer: Self-pay | Admitting: Internal Medicine

## 2021-11-12 NOTE — Telephone Encounter (Signed)
Patient called and it was very difficult to hear her. Patient was upset and talking very fast. She states that she received a letter/ big packet in the mail from our office. 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   Please advise, call back [phone].

## 2021-11-14 NOTE — Telephone Encounter (Signed)
Called and spoke with patient. I had her to describe the packet of information she received. It sounds like she had received a packet from behavorial health. She does not wish to proceed with behavorial health at this time. She appreciates [name] looking out for her.   She is aware to call us if anything changes.   Nothing further needed at time of call.

## 2021-11-18 DIAGNOSIS — J209 Acute bronchitis, unspecified: Secondary | ICD-10-CM | POA: Insufficient documentation

## 2021-11-18 NOTE — Assessment & Plan Note (Addendum)
-   Prod cough x 1 week with purulent sputum following episode of aspiration. Checking CXR. Sending in Rx Augmentin 1 tab twice daily x 7 days. Advised patient take mucinex 600mg  twice daily and follow aspiration precautions

## 2021-11-18 NOTE — Assessment & Plan Note (Signed)
-   Continue Anoro daily scheduled and do nebulizer as needed

## 2021-11-18 NOTE — Assessment & Plan Note (Signed)
-   Referring to behavioral health and psychiatry

## 2021-11-19 ENCOUNTER — Other Ambulatory Visit: Payer: Self-pay | Admitting: Internal Medicine

## 2021-11-20 DIAGNOSIS — G5602 Carpal tunnel syndrome, left upper limb: Secondary | ICD-10-CM | POA: Diagnosis not present

## 2021-11-20 DIAGNOSIS — M25562 Pain in left knee: Secondary | ICD-10-CM | POA: Diagnosis not present

## 2021-11-20 DIAGNOSIS — M4726 Other spondylosis with radiculopathy, lumbar region: Secondary | ICD-10-CM | POA: Diagnosis not present

## 2021-11-20 DIAGNOSIS — K5903 Drug induced constipation: Secondary | ICD-10-CM | POA: Diagnosis not present

## 2021-11-20 DIAGNOSIS — G894 Chronic pain syndrome: Secondary | ICD-10-CM | POA: Diagnosis not present

## 2021-11-20 DIAGNOSIS — Z79891 Long term (current) use of opiate analgesic: Secondary | ICD-10-CM | POA: Diagnosis not present

## 2021-11-20 DIAGNOSIS — M4722 Other spondylosis with radiculopathy, cervical region: Secondary | ICD-10-CM | POA: Diagnosis not present

## 2021-11-20 DIAGNOSIS — G4701 Insomnia due to medical condition: Secondary | ICD-10-CM | POA: Diagnosis not present

## 2021-11-21 DIAGNOSIS — J439 Emphysema, unspecified: Secondary | ICD-10-CM | POA: Diagnosis not present

## 2021-11-21 DIAGNOSIS — E782 Mixed hyperlipidemia: Secondary | ICD-10-CM | POA: Diagnosis not present

## 2021-11-21 DIAGNOSIS — I5042 Chronic combined systolic (congestive) and diastolic (congestive) heart failure: Secondary | ICD-10-CM | POA: Diagnosis not present

## 2021-11-21 DIAGNOSIS — I10 Essential (primary) hypertension: Secondary | ICD-10-CM | POA: Diagnosis not present

## 2021-11-21 DIAGNOSIS — I2510 Atherosclerotic heart disease of native coronary artery without angina pectoris: Secondary | ICD-10-CM | POA: Diagnosis not present

## 2021-11-22 ENCOUNTER — Encounter: Payer: Self-pay | Admitting: Primary Care

## 2021-11-22 ENCOUNTER — Ambulatory Visit: Payer: Medicare Other | Admitting: Primary Care

## 2021-11-22 ENCOUNTER — Other Ambulatory Visit: Payer: Self-pay | Admitting: Cardiology

## 2021-11-22 ENCOUNTER — Ambulatory Visit (INDEPENDENT_AMBULATORY_CARE_PROVIDER_SITE_OTHER): Payer: Medicare Other

## 2021-11-22 DIAGNOSIS — Z9189 Other specified personal risk factors, not elsewhere classified: Secondary | ICD-10-CM | POA: Diagnosis not present

## 2021-11-22 DIAGNOSIS — J438 Other emphysema: Secondary | ICD-10-CM | POA: Diagnosis not present

## 2021-11-22 DIAGNOSIS — J209 Acute bronchitis, unspecified: Secondary | ICD-10-CM

## 2021-11-22 DIAGNOSIS — J40 Bronchitis, not specified as acute or chronic: Secondary | ICD-10-CM | POA: Diagnosis not present

## 2021-11-22 NOTE — Assessment & Plan Note (Signed)
-   Stable; Continue Anoro Ellipta - Needs repeat PFTs at follow-up in 3 months
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
## 2021-11-22 NOTE — Assessment & Plan Note (Addendum)
-   Improved; Treated for acute bronchitis with Augmentin in May following episode of aspiration. Cough resolved. CXR showed stable prominent interstitial markings t/o both lungs, similar to prior study in Sept 2022. No focal consolidation.

## 2021-12-06 ENCOUNTER — Telehealth: Payer: Self-pay | Admitting: Internal Medicine

## 2021-12-06 NOTE — Telephone Encounter (Signed)
Called and spoke with patient. It was very difficult to understand her completely on the phone. She stated that her skin has been "burning" since yesterday morning. The areas on her upper back, chest and neck are the worst areas. She denied any recent excessive sun exposure or exposure to chemicals. Also denied noticing any new rashes in the area. No recent fevers.    I asked her if she had any respiratory symptoms and she only mentioned increased wheezing. She has not been coughing more than usual.   I also asked her if she had contacted her PCP yet and she stated that she hadn't yet because she wanted MR's recommendations. She is aware that MR may ask her to contact her PCP.   MR, can you please advise? Thanks!

## 2021-12-06 NOTE — Telephone Encounter (Signed)
Yeah PCP unfortunately she has to clal PCP

## 2021-12-06 NOTE — Telephone Encounter (Signed)
Pt states her skin is burning. She is hot but also cold. Has not taken her temperature. Has not tested for covid. Advised pt that we cannot schedule appt if she has a fever. Please advise.

## 2021-12-07 NOTE — Telephone Encounter (Signed)
Spoke with pt and advised to follow up with PCP. Pt stated understanding. Nothing further needed at this time.

## 2021-12-12 ENCOUNTER — Telehealth: Payer: Self-pay | Admitting: Cardiology

## 2021-12-12 NOTE — Telephone Encounter (Signed)
Pt c/o medication issue:  1. Name of Medication:  isosorbide mononitrate (IMDUR) 120 MG 24 hr tablet (Expired)  2. How are you currently taking this medication (dosage and times per day)? As prescribed   3. Are you having a reaction (difficulty breathing--STAT)? No   4. What is your medication issue? Patient is requesting a callback wanting to discuss increasing medication to 3 tablets daily.    Pt c/o swelling: STAT is pt has developed SOB within 24 hours  If swelling, where is the swelling located? Ankles, Hands and legs  How much weight have you gained and in what time span? 6 lbs in a day, last week   Have you gained 3 pounds in a day or 5 pounds in a week? Yes  Do you have a log of your daily weights (if so, list)? No. Weighs everyday, but doesn't record them   Are you currently taking a fluid pill? Yes   Are you currently SOB? Yes, cannot hear over the phone.   Have you traveled recently? No    Patient states for the past 3 weeks she has also been sleeping for days at a time.

## 2021-12-12 NOTE — Telephone Encounter (Signed)
Left a message for patient to call office back regarding medication/swelling

## 2021-12-13 ENCOUNTER — Ambulatory Visit: Payer: Medicare Other | Admitting: Psychologist

## 2021-12-13 NOTE — Telephone Encounter (Signed)
Patient was driving and state she would call office back

## 2021-12-14 DIAGNOSIS — E782 Mixed hyperlipidemia: Secondary | ICD-10-CM | POA: Diagnosis not present

## 2021-12-14 DIAGNOSIS — I5042 Chronic combined systolic (congestive) and diastolic (congestive) heart failure: Secondary | ICD-10-CM | POA: Diagnosis not present

## 2021-12-14 DIAGNOSIS — J439 Emphysema, unspecified: Secondary | ICD-10-CM | POA: Diagnosis not present

## 2021-12-14 DIAGNOSIS — I2510 Atherosclerotic heart disease of native coronary artery without angina pectoris: Secondary | ICD-10-CM | POA: Diagnosis not present

## 2021-12-14 DIAGNOSIS — I10 Essential (primary) hypertension: Secondary | ICD-10-CM | POA: Diagnosis not present

## 2021-12-20 ENCOUNTER — Other Ambulatory Visit: Payer: Self-pay | Admitting: Cardiology

## 2021-12-20 ENCOUNTER — Other Ambulatory Visit: Payer: Self-pay | Admitting: Neurology

## 2021-12-20 DIAGNOSIS — G35 Multiple sclerosis: Secondary | ICD-10-CM

## 2021-12-24 DIAGNOSIS — M4726 Other spondylosis with radiculopathy, lumbar region: Secondary | ICD-10-CM | POA: Diagnosis not present

## 2021-12-24 DIAGNOSIS — M4722 Other spondylosis with radiculopathy, cervical region: Secondary | ICD-10-CM | POA: Diagnosis not present

## 2021-12-24 DIAGNOSIS — G894 Chronic pain syndrome: Secondary | ICD-10-CM | POA: Diagnosis not present

## 2021-12-24 DIAGNOSIS — Z79891 Long term (current) use of opiate analgesic: Secondary | ICD-10-CM | POA: Diagnosis not present

## 2021-12-25 ENCOUNTER — Other Ambulatory Visit: Payer: Self-pay | Admitting: Cardiology

## 2022-01-01 ENCOUNTER — Telehealth: Payer: Self-pay | Admitting: Internal Medicine

## 2022-01-01 NOTE — Telephone Encounter (Signed)
Called and spoke with patient. Patient wants to know if Dr. Chase Caller is discussing her and her husbands issues with other people. Patient stated that she got told Dr. Chase Caller was discussing her and her husbands issues to other people.   MR, please advise.

## 2022-01-02 NOTE — Telephone Encounter (Signed)
Spoke with the pt  She is referring to her spouse and her other doctors  She says her spouse tells her that her doctors are all against her and talking badly about her  I let her know of the below statement from MR  She verbalized understanding  She said that she figured that her spouse was being dishonest, and feels better now that we have reassured her that no HIPPA violation has taken place.

## 2022-01-02 NOTE — Telephone Encounter (Signed)
#  1) I do not know what "other people" mean. Can you find out please who other people she is referring to are  #2) Also For sure 100% there is NO HIPAA violation from my end which is telling people outside the medical enviroment who are not taking care of her.  #3) There is a medical requirement to document her complaints and symptoms and diagnosis in the medical chart. This has been done.

## 2022-01-17 ENCOUNTER — Other Ambulatory Visit: Payer: Self-pay | Admitting: Neurology

## 2022-01-17 DIAGNOSIS — G35 Multiple sclerosis: Secondary | ICD-10-CM

## 2022-01-18 ENCOUNTER — Other Ambulatory Visit: Payer: Self-pay | Admitting: Neurology

## 2022-01-18 DIAGNOSIS — G35 Multiple sclerosis: Secondary | ICD-10-CM

## 2022-01-18 MED ORDER — MODAFINIL 200 MG PO TABS: 200.0000 mg | ORAL_TABLET | Freq: Two times a day (BID) | ORAL | 0 refills | Status: DC

## 2022-01-21 ENCOUNTER — Other Ambulatory Visit: Payer: Self-pay | Admitting: Cardiology

## 2022-01-21 ENCOUNTER — Other Ambulatory Visit: Payer: Self-pay

## 2022-01-21 DIAGNOSIS — M4722 Other spondylosis with radiculopathy, cervical region: Secondary | ICD-10-CM | POA: Diagnosis not present

## 2022-01-21 DIAGNOSIS — Z79891 Long term (current) use of opiate analgesic: Secondary | ICD-10-CM | POA: Diagnosis not present

## 2022-01-21 DIAGNOSIS — G894 Chronic pain syndrome: Secondary | ICD-10-CM | POA: Diagnosis not present

## 2022-01-21 DIAGNOSIS — M4726 Other spondylosis with radiculopathy, lumbar region: Secondary | ICD-10-CM | POA: Diagnosis not present

## 2022-01-21 DIAGNOSIS — G35 Multiple sclerosis: Secondary | ICD-10-CM

## 2022-01-21 NOTE — Progress Notes (Signed)
One year MRI With/Out Contrast due before her appt 02/08/22

## 2022-01-22 ENCOUNTER — Telehealth: Payer: Self-pay | Admitting: Neurology

## 2022-01-22 DIAGNOSIS — G35 Multiple sclerosis: Secondary | ICD-10-CM
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
## 2022-01-23 DIAGNOSIS — R1012 Left upper quadrant pain: Secondary | ICD-10-CM | POA: Diagnosis not present

## 2022-01-23 DIAGNOSIS — R29818 Other symptoms and signs involving the nervous system: Secondary | ICD-10-CM | POA: Diagnosis not present

## 2022-01-24 NOTE — Telephone Encounter (Signed)
Telephone call to patient, Advised patient order for MRI changed to Without Contrast.

## 2022-02-02 ENCOUNTER — Emergency Department (HOSPITAL_COMMUNITY): Payer: Medicare Other

## 2022-02-02 ENCOUNTER — Other Ambulatory Visit: Payer: Self-pay

## 2022-02-02 ENCOUNTER — Emergency Department (HOSPITAL_COMMUNITY)
Admission: EM | Admit: 2022-02-02 | Discharge: 2022-02-02 | Disposition: A | Discharge status: Home / Self Care | Payer: Medicare Other | Attending: Emergency Medicine | Admitting: Emergency Medicine

## 2022-02-02 ENCOUNTER — Encounter (HOSPITAL_COMMUNITY): Payer: Self-pay

## 2022-02-02 DIAGNOSIS — S299XXA Unspecified injury of thorax, initial encounter: Secondary | ICD-10-CM | POA: Diagnosis present

## 2022-02-02 DIAGNOSIS — I509 Heart failure, unspecified: Secondary | ICD-10-CM | POA: Diagnosis not present

## 2022-02-02 DIAGNOSIS — S0083XA Contusion of other part of head, initial encounter: Secondary | ICD-10-CM | POA: Insufficient documentation

## 2022-02-02 DIAGNOSIS — Z79899 Other long term (current) drug therapy: Secondary | ICD-10-CM | POA: Diagnosis not present

## 2022-02-02 DIAGNOSIS — J449 Chronic obstructive pulmonary disease, unspecified: Secondary | ICD-10-CM | POA: Insufficient documentation

## 2022-02-02 DIAGNOSIS — M25551 Pain in right hip: Secondary | ICD-10-CM

## 2022-02-02 DIAGNOSIS — R109 Unspecified abdominal pain: Secondary | ICD-10-CM

## 2022-02-02 DIAGNOSIS — E876 Hypokalemia: Secondary | ICD-10-CM | POA: Insufficient documentation

## 2022-02-02 DIAGNOSIS — Z7982 Long term (current) use of aspirin: Secondary | ICD-10-CM | POA: Diagnosis not present

## 2022-02-02 DIAGNOSIS — S0093XA Contusion of unspecified part of head, initial encounter: Secondary | ICD-10-CM | POA: Diagnosis not present

## 2022-02-02 DIAGNOSIS — R1031 Right lower quadrant pain: Secondary | ICD-10-CM | POA: Diagnosis not present

## 2022-02-02 DIAGNOSIS — G8929 Other chronic pain: Secondary | ICD-10-CM | POA: Insufficient documentation

## 2022-02-02 DIAGNOSIS — Z7951 Long term (current) use of inhaled steroids: Secondary | ICD-10-CM | POA: Insufficient documentation

## 2022-02-02 DIAGNOSIS — W19XXXA Unspecified fall, initial encounter: Secondary | ICD-10-CM | POA: Insufficient documentation

## 2022-02-02 DIAGNOSIS — M5030 Other cervical disc degeneration, unspecified cervical region: Secondary | ICD-10-CM | POA: Diagnosis not present

## 2022-02-02 DIAGNOSIS — R209 Unspecified disturbances of skin sensation: Secondary | ICD-10-CM | POA: Insufficient documentation

## 2022-02-02 DIAGNOSIS — S2241XA Multiple fractures of ribs, right side, initial encounter for closed fracture: Secondary | ICD-10-CM | POA: Diagnosis not present

## 2022-02-02 LAB — CBC WITH DIFFERENTIAL/PLATELET
Abs Immature Granulocytes: 0.01 10*3/uL (ref 0.00–0.07)
Basophils Absolute: 0 10*3/uL (ref 0.0–0.1)
Basophils Relative: 0 %
Eosinophils Absolute: 0.2 10*3/uL (ref 0.0–0.5)
Eosinophils Relative: 2 %
HCT: 32.8 % — ABNORMAL LOW (ref 36.0–46.0)
Hemoglobin: 10.7 g/dL — ABNORMAL LOW (ref 12.0–15.0)
Immature Granulocytes: 0 %
Lymphocytes Relative: 23 %
Lymphs Abs: 1.7 10*3/uL (ref 0.7–4.0)
MCH: 29.7 pg (ref 26.0–34.0)
MCHC: 32.6 g/dL (ref 30.0–36.0)
MCV: 91.1 fL (ref 80.0–100.0)
Monocytes Absolute: 0.5 10*3/uL (ref 0.1–1.0)
Monocytes Relative: 7 %
Neutro Abs: 5 10*3/uL (ref 1.7–7.7)
Neutrophils Relative %: 68 %
Platelets: 259 10*3/uL (ref 150–400)
RBC: 3.6 MIL/uL — ABNORMAL LOW (ref 3.87–5.11)
RDW: 13.5 % (ref 11.5–15.5)
WBC: 7.4 10*3/uL (ref 4.0–10.5)
nRBC: 0 % (ref 0.0–0.2)

## 2022-02-02 LAB — COMPREHENSIVE METABOLIC PANEL
ALT: 13 U/L (ref 0–44)
AST: 30 U/L (ref 15–41)
Albumin: 3.9 g/dL (ref 3.5–5.0)
Alkaline Phosphatase: 86 U/L (ref 38–126)
Anion gap: 8 (ref 5–15)
BUN: 17 mg/dL (ref 8–23)
CO2: 31 mmol/L (ref 22–32)
Calcium: 8.8 mg/dL — ABNORMAL LOW (ref 8.9–10.3)
Chloride: 97 mmol/L — ABNORMAL LOW (ref 98–111)
Creatinine, Ser: 0.97 mg/dL (ref 0.44–1.00)
GFR, Estimated: >60 mL/min (ref >60)
Glucose, Bld: 130 mg/dL — ABNORMAL HIGH (ref 70–99)
Potassium: 3.1 mmol/L — ABNORMAL LOW (ref 3.5–5.1)
Sodium: 136 mmol/L (ref 135–145)
Total Bilirubin: 0.4 mg/dL (ref 0.3–1.2)
Total Protein: 6.9 g/dL (ref 6.5–8.1)

## 2022-02-02 LAB — URINALYSIS, ROUTINE W REFLEX MICROSCOPIC
Bilirubin Urine: NEGATIVE
Glucose, UA: NEGATIVE mg/dL
Hgb urine dipstick: NEGATIVE
Ketones, ur: NEGATIVE mg/dL
Leukocytes,Ua: NEGATIVE
Nitrite: NEGATIVE
Protein, ur: NEGATIVE mg/dL
Specific Gravity, Urine: 1.008 (ref 1.005–1.030)
pH: 6 (ref 5.0–8.0)

## 2022-02-02 LAB — PROTIME-INR
INR: 1 (ref 0.8–1.2)
Prothrombin Time: 12.7 seconds (ref 11.4–15.2)

## 2022-02-02 LAB — LIPASE, BLOOD: Lipase: 28 U/L (ref 11–51)

## 2022-02-02 MED ORDER — POTASSIUM CHLORIDE CRYS ER 20 MEQ PO TBCR
40.0000 meq | EXTENDED_RELEASE_TABLET | Freq: Once | ORAL | Status: AC
  Administered 2022-02-02: 40 meq via ORAL
  Filled 2022-02-02: qty 2

## 2022-02-02 MED ORDER — MORPHINE SULFATE (PF) 4 MG/ML IV SOLN
4.0000 mg | Freq: Once | INTRAVENOUS | Status: AC
  Administered 2022-02-02: 4 mg via INTRAVENOUS
  Filled 2022-02-02: qty 1

## 2022-02-02 MED ORDER — ONDANSETRON HCL 4 MG/2ML IJ SOLN
4.0000 mg | Freq: Once | INTRAMUSCULAR | Status: AC
  Administered 2022-02-02: 4 mg via INTRAVENOUS
  Filled 2022-02-02: qty 2
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
## 2022-02-02 NOTE — ED Notes (Signed)
IS volume of 1500 over 5 repeated attempts
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
## 2022-02-05 DIAGNOSIS — J439 Emphysema, unspecified: Secondary | ICD-10-CM | POA: Diagnosis not present

## 2022-02-05 DIAGNOSIS — I5042 Chronic combined systolic (congestive) and diastolic (congestive) heart failure: Secondary | ICD-10-CM | POA: Diagnosis not present

## 2022-02-05 DIAGNOSIS — I2510 Atherosclerotic heart disease of native coronary artery without angina pectoris: Secondary | ICD-10-CM | POA: Diagnosis not present

## 2022-02-05 DIAGNOSIS — I10 Essential (primary) hypertension: Secondary | ICD-10-CM | POA: Diagnosis not present

## 2022-02-05 DIAGNOSIS — E782 Mixed hyperlipidemia: Secondary | ICD-10-CM | POA: Diagnosis not present

## 2022-02-05 NOTE — Progress Notes (Signed)
NEUROLOGY FOLLOW UP OFFICE NOTE  [name] [id number]  Assessment/Plan:   Multiple sclerosis  DMT:  none D3 2000 IU daily Provigil 200mg  daily Gabapentin for pain Follow up one year  Subjective:  [name] is a 76 year old right-handed female with CAD, COPD, emphysema, CHF, thyroid disease, IBS, arthritis, depression, fibromyalgia who follows up for multiple sclerosis.   UPDATE: Current DMT: none Current medications:  Gabapentin 300mg  at night and 100mg  during day if needed. Clonazepam 1mg  during day and 2mg  at bedtime for spasms D3 2000 IU daily Provigil 200mg  Doxepin 25mg  QHS   Ordered MRI to be performed prior to today's follow up but she reports that a lot has been going on and couldn't do it.     Vision:  Permanent vision loss in left eye Motor:  Stable.  Chronic head tremor Sensory:  stable Pain:  Pain in arms and legs.  Lumbar radiculopathy Gait:  Sometimes requires ambulation with cane or walker.   Bowel/Bladder:  Stable Fatigue:  Yes Cognition:  Short-term memory deficits. Mood:  Depressed. She reports difficulty with speech.  She has trouble with pronouncing them.  It has been ongoing for several months but has gotten worse.     HISTORY: First symptom occurred in 1995, when she developed vision loss in the left eye, as well as left eye pain.  MRI of the brain with and without contrast was performed on 03/28/95 revealed 10-12 periventricular white matter lesions and one lesion in the corpus callosum, suspicious for demyelination.  There was also enhancement of the left optic nerve.  She was referred to Dr. Delphia Grates, an [facility] specialist.  Lumbar puncture was performed, revealing no oligoclonal bands or myelin basic protein.  She has not required hospitalization or steroids since the late 1990s.  She was on Betaseron and later Copaxone.  Copaxone was discontinued due to longstanding stability, but due to fatigue and cognitive issues, it was restarted  in 2014.  Her continued symptoms are episodes of worsening fatigue, as well as cognitive issues.  Sometimes, she has difficulty thinking clearly.  She will get disoriented while driving.  Other times, she has word-finding difficulties.  She once overpaid a bill.  She still performs all ADLs, including paying the bills.  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  Sometimes, when she feels symptoms are worse, she sees a white "half a doughnut" shape in her visual field.     Past disease-modifying agents:  Betaseron (elevated LFTs and necrotic injection sites), Copaxone   For fatigue, she takes Provigil.  Adderal was ineffective.  Amantadine was ineffective. For pain in the arms and legs, she takes gabapentin 300mg  at bedtime.  She reportedly has lumbar radiculopathy which also contributes to the leg pain. Initially, she reportedly had significant spasticity.  She was on oral baclofen which did not help.  At one point, a baclofen pump was entertained.  She currently takes clonazepam 1mg  during the day and 2mg  at bedtime, which is effective.   Other imaging: 05/07/95 MRI LUMBAR WO:  mild disc bulging at L4-5 without disc herniation or central canal stenosis. 03/09/96 MRI BRAIN W/WO (comparing to MRI from 03/28/95):  slight increase in number of white matter lesions adjacent to the atrium of the right lateral ventricle, otherwise unchanged. 03/28/98 MRI BRAIN W/WO (comparing to MRI from 03/25/97):  several subtle areas of increased signal in the deep white matter, not as prevalent as on prior study.  No abnormal enhancement. 08/11/13 MRI of brain with and without contrast :  multiple supratentorial and infratentorial T2 hyperintensities, but no post-contrast enhancement.  However, this was not compared to prior imaging. 01/28/14 MRI Cervical spine without contrast:  spinal cord unremarkable.[name] MRI of brain with and without contrast from 08/17/14 is stable MRI of brain without  contrast from 05/08/15 is stable. MRI of brain with and without contrast from 03/04/17 unchanged from prior study on 05/08/15.  PAST MEDICAL HISTORY: Past Medical History:  Diagnosis Date   Anxiety    Aortic atherosclerosis ([place]) 09/29/2017   High Res CT in 9/18: aortic atherosclerosis; coronary artery calcification   Cervical disc disease    BULGING   Chronic combined systolic and diastolic CHF (congestive heart failure) ([place]) 09/29/2017   Echo 7/18: Moderate concentric LVH, grade 1 diastolic dysfunction, abnormal septal wall motion due to LBBB, moderate diffuse HK, EF 35-40, atrial septal aneurysm with possible PFO, mild TR // Echo 5/19: EF 50-55, normal wall motion, grade 1 diastolic dysfunction, trivial TR   Complication of anesthesia    SLOW TO AWAKEN AFTER LAST COLONSCOPY   Coronary artery disease 05/09/2015   LHC 10/12: EF 55, OM1 80-90 - difficult to engage >> treated medically // Nuc 8/18: EF 40, no ischemia   Depression    DJD (degenerative joint disease)    OA   Dysrhythmia    Not clear where this diagnosis came from   Emphysema of lung ([place])    Dr. Chase Caller   Fibromyalgia    H/O: liver disease 21 YRS AGO   tranamanitis due to previous interferon - LFTs improved off of   History of MI (myocardial infarction) FEW YRS AGO   History of TIA (transient ischemic attack)    Hx of colonic polyps    Hyperlipidemia    intol to statin - myalgias // ESPERION trial - patient stopped   Multiple sclerosis ([place])    Narcotic dependence ([place])    hx of benzodiazepine and narcotic   Osteoporosis    Positive H. pylori test    Urinary incontinence     MEDICATIONS: Current Outpatient Medications on File Prior to Visit  Medication Sig Dispense Refill   albuterol (VENTOLIN HFA) 108 (90 Base) MCG/ACT inhaler INHALE 2 PUFFS INTO THE LUNGS EVERY 6 HOURS AS NEEDED FOR WHEEZING OR SHORTNESS OF BREATH 8.5 g 3   amoxicillin-clavulanate (AUGMENTIN) 875-125 MG tablet Take 1 tablet by mouth 2 (two)  times daily. 14 tablet 0   ANORO ELLIPTA 62.5-25 MCG/ACT AEPB INAHLE 1 PUFF INTO THE LUNGS DAILY 60 each 6   Ascorbic Acid (VITAMIN C) 500 MG CAPS See admin instructions.     aspirin EC 81 MG tablet Take 81 mg by mouth daily.     CALCIUM PO Take 1 tablet by mouth daily.     Cholecalciferol (VITAMIN D3) 125 MCG (5000 UT) CAPS Take 5,000 Units by mouth daily.      clonazePAM (KLONOPIN) 1 MG tablet Take 1 mg by mouth 3 (three) times daily as needed for anxiety.      COENZYME Q10 PO Take 1 capsule by mouth daily.      diphenhydrAMINE (BENADRYL) 50 MG capsule Take 50 mg by mouth at bedtime as needed for sleep.     doxepin (SINEQUAN) 25 MG capsule Take 1 capsule (25 mg total) by mouth at bedtime. 30 capsule 5   fluticasone (FLONASE) 50 MCG/ACT nasal spray Place 2 sprays into both nostrils daily as needed for rhinitis.  gabapentin (NEURONTIN) 100 MG capsule Take 100 mg by mouth daily as needed (Pain).      gabapentin (NEURONTIN) 300 MG capsule TAKE 1 CAPSULE BY MOUTH DAILY 30 capsule 7   GLATOPA 20 MG/ML SOSY injection USE 1 INJECTION SUBCUTANEOUSLY ONCE A DAY 30 mL 3   guaiFENesin (MUCINEX) 600 MG 12 hr tablet Take 600 mg by mouth 2 (two) times daily as needed for cough or to loosen phlegm.      Homeopathic Products (SIMILASAN DRY EYE RELIEF OP) Place 1 drop into both eyes 2 (two) times daily.     ipratropium (ATROVENT) 0.02 % nebulizer solution USE 2.5 MLS (0.5 MG TOTAL) BY NEBULIZER 4 TIMES DAILY 300 mL 5   isosorbide mononitrate (IMDUR) 120 MG 24 hr tablet Take 2 tablets (240 mg total) by mouth daily. 180 tablet 3   losartan (COZAAR) 25 MG tablet TAKE 1 TABLET BY MOUTH TWICE A DAY 180 tablet 3   lovastatin (MEVACOR) 40 MG tablet TAKE 1 TABLET BY MOUTH EVERY NIGHT AT BEDTIME 90 tablet 1   metoprolol succinate (TOPROL-XL) 25 MG 24 hr tablet Take 0.5 tablets (12.5 mg total) by mouth daily. 45 tablet 3   modafinil (PROVIGIL) 200 MG tablet Take 1 tablet (200 mg total) by mouth 2 (two) times daily. 60  tablet 0   mometasone (NASONEX) 50 MCG/ACT nasal spray SPRAY 2 SPRAYS INTO THE NOSE DAILY 17 g 5   Multiple Vitamin (MULTIVITAMIN WITH MINERALS) TABS tablet Take 1 tablet by mouth daily.     nitroGLYCERIN (NITROSTAT) 0.4 MG SL tablet DISSOLVE 1 TABLET UNDER TONGUE AS NEEDEDFOR CHEST PAIN. MAY REPEAT 5 MINUTES APART 3 TIMES IF NEEDED 25 tablet 3   NUCYNTA ER 50 MG TB12 Take 50 mg by mouth every 12 (twelve) hours.      Omega-3 Fatty Acids (OMEGA 3 PO) Take 1 capsule by mouth daily.     omeprazole (PRILOSEC) 20 MG capsule TAKE 1 CAPSULE BY MOUTH ONCE DAILY 30 capsule 5   oxyCODONE-acetaminophen (PERCOCET) 5-325 MG per tablet Take 1 tablet by mouth every 4 (four) hours as needed for moderate pain or severe pain.     polyethylene glycol powder (GLYCOLAX/MIRALAX) powder Take 17 g by mouth daily as needed for mild constipation or moderate constipation.      potassium chloride SA (KLOR-CON M20) 20 MEQ tablet Take 40 mg ( 2 tablets) daily for 4 days 8 tablet 0   PRISTIQ 50 MG 24 hr tablet Take 50 mg by mouth every evening.      torsemide (DEMADEX) 20 MG tablet TAKE 1 TABLET BY MOUTH AS NEEDED FOR SWELLING AND WEIGHT GAIN 90 tablet 2   vitamin B-12 (CYANOCOBALAMIN) 1000 MCG tablet Take 1,500 mcg by mouth daily.     No current facility-administered medications on file prior to visit.    ALLERGIES: Allergies  Allergen Reactions   Contrast Media [Iodinated Contrast Media] Rash and Other (See Comments)    Severe rash   Crestor [Rosuvastatin] Hives and Other (See Comments)    welps   Sulfonamide Derivatives Hives    As a child   Actonel [Risedronate]     Other reaction(s): chest pain   Doxycycline    Parathyroid Hormone (Recomb)     Other reaction(s): hair loss or weakness   Sulfamethoxazole Other (See Comments)    FAMILY HISTORY: Family History  Problem Relation Age of Onset   Heart attack Mother 39   Heart attack Father 34   Cancer Brother  Objective:  Blood pressure 135/82, pulse  (!) 107, height 5\' 1"  (1.549 m), weight 113 lb 3.2 oz (51.3 kg), SpO2 98 %. General: No acute distress.  Patient appears well-groomed.   Head:  Normocephalic/atraumatic Eyes:  Fundi examined but not visualized Neck: supple, no paraspinal tenderness, full range of motion Heart:  Regular rate and rhythm Lungs:  Clear to auscultation bilaterally Back: No paraspinal tenderness Neurological Exam: alert and oriented to person, place, and time. Speech fluent and not dysarthric, language intact.  Left monocular vision loss.  Otherwise, CN II-XII intact. Bulk and tone normal, muscle strength 5/5 throughout.  Head tremor.  Sensation to light touch, temperature and vibration intact.  Deep tendon reflexes 3+ throughout, toes downgoing.  Finger to nose and heel to shin testing intact.  Gait steady; Romberg negative.     Metta Clines, DO  CC: Donald Prose, MD

## 2022-02-07 ENCOUNTER — Encounter: Payer: Self-pay | Admitting: Neurology

## 2022-02-07 ENCOUNTER — Other Ambulatory Visit: Payer: Medicare Other

## 2022-02-08 ENCOUNTER — Encounter: Payer: Self-pay | Admitting: Neurology

## 2022-02-08 ENCOUNTER — Ambulatory Visit: Payer: Medicare Other | Admitting: Neurology

## 2022-02-08 VITALS — BP 135/82 | HR 107 | Ht 61.0 in | Wt 113.2 lb

## 2022-02-08 DIAGNOSIS — G35 Multiple sclerosis: Secondary | ICD-10-CM | POA: Diagnosis not present

## 2022-02-11 DIAGNOSIS — I25118 Atherosclerotic heart disease of native coronary artery with other forms of angina pectoris: Secondary | ICD-10-CM | POA: Diagnosis not present

## 2022-02-11 DIAGNOSIS — E860 Dehydration: Secondary | ICD-10-CM | POA: Diagnosis not present

## 2022-02-11 DIAGNOSIS — J439 Emphysema, unspecified: Secondary | ICD-10-CM | POA: Diagnosis not present

## 2022-02-11 DIAGNOSIS — E876 Hypokalemia: Secondary | ICD-10-CM | POA: Diagnosis not present

## 2022-02-14 ENCOUNTER — Other Ambulatory Visit: Payer: Self-pay

## 2022-02-14 ENCOUNTER — Telehealth: Payer: Self-pay | Admitting: Neurology

## 2022-02-14 DIAGNOSIS — G35 Multiple sclerosis: Secondary | ICD-10-CM

## 2022-02-14 MED ORDER — MODAFINIL 200 MG PO TABS: 200.0000 mg | ORAL_TABLET | Freq: Two times a day (BID) | ORAL | 0 refills | Status: DC

## 2022-02-14 NOTE — Telephone Encounter (Signed)
10 day supply sent in for pt to make sure she has enough

## 2022-02-14 NOTE — Telephone Encounter (Signed)
Pt called to find out what medication is missing no answer left a voice mail to call the office back

## 2022-02-14 NOTE — Telephone Encounter (Signed)
The following message was left with [facility] on 02/14/22 at 12:31 PM.  Caller's medication disappeared and it's 7 days before a refill. She'd like a replacement supply if able.  I returned the call and left a message to call back with name of Rx and pharmacy.

## 2022-02-15 MED ORDER — MODAFINIL 200 MG PO TABS: 200.0000 mg | ORAL_TABLET | Freq: Two times a day (BID) | ORAL | 0 refills | Status: DC

## 2022-02-18 DIAGNOSIS — M4722 Other spondylosis with radiculopathy, cervical region: Secondary | ICD-10-CM | POA: Diagnosis not present

## 2022-02-18 DIAGNOSIS — Z79891 Long term (current) use of opiate analgesic: Secondary | ICD-10-CM | POA: Diagnosis not present

## 2022-02-18 DIAGNOSIS — M4726 Other spondylosis with radiculopathy, lumbar region: Secondary | ICD-10-CM | POA: Diagnosis not present

## 2022-02-18 DIAGNOSIS — G894 Chronic pain syndrome: Secondary | ICD-10-CM | POA: Diagnosis not present

## 2022-02-19 ENCOUNTER — Other Ambulatory Visit: Payer: Self-pay | Admitting: Internal Medicine

## 2022-02-26 ENCOUNTER — Ambulatory Visit: Payer: Medicare Other | Admitting: Internal Medicine

## 2022-02-26 ENCOUNTER — Telehealth: Payer: Self-pay | Admitting: Internal Medicine

## 2022-02-26 ENCOUNTER — Encounter: Payer: Self-pay | Admitting: Internal Medicine

## 2022-02-26 ENCOUNTER — Ambulatory Visit (INDEPENDENT_AMBULATORY_CARE_PROVIDER_SITE_OTHER): Payer: Medicare Other | Admitting: Internal Medicine

## 2022-02-26 VITALS — BP 118/70 | HR 94 | Temp 97.8°F | Ht 60.0 in | Wt 113.6 lb

## 2022-02-26 DIAGNOSIS — T7491XS Unspecified adult maltreatment, confirmed, sequela: Secondary | ICD-10-CM

## 2022-02-26 DIAGNOSIS — R911 Solitary pulmonary nodule: Secondary | ICD-10-CM

## 2022-02-26 DIAGNOSIS — J438 Other emphysema: Secondary | ICD-10-CM

## 2022-02-26 DIAGNOSIS — R040 Epistaxis: Secondary | ICD-10-CM

## 2022-02-26 LAB — PULMONARY FUNCTION TEST
DL/VA % pred: 86 %
DL/VA: 3.64 ml/min/mmHg/L
DLCO cor % pred: 93 %
DLCO cor: 15.49 ml/min/mmHg
DLCO unc % pred: 85 %
DLCO unc: 14.03 ml/min/mmHg
FEF 25-75 Post: 2.88 L/sec
FEF 25-75 Pre: 2.73 L/sec
FEF2575-%Change-Post: 5 %
FEF2575-%Pred-Post: 215 %
FEF2575-%Pred-Pre: 204 %
FEV1-%Change-Post: 0 %
FEV1-%Pred-Post: 126 %
FEV1-%Pred-Pre: 125 %
FEV1-Post: 2.12 L
FEV1-Pre: 2.1 L
FEV1FVC-%Change-Post: 0 %
FEV1FVC-%Pred-Pre: 115 %
FEV6-%Change-Post: 0 %
FEV6-%Pred-Post: 115 %
FEV6-%Pred-Pre: 114 %
FEV6-Post: 2.47 L
FEV6-Pre: 2.45 L
FEV6FVC-%Pred-Post: 105 %
FEV6FVC-%Pred-Pre: 105 %
FVC-%Change-Post: 0 %
FVC-%Pred-Post: 109 %
FVC-%Pred-Pre: 108 %
FVC-Post: 2.47 L
FVC-Pre: 2.45 L
Post FEV1/FVC ratio: 86 %
Post FEV6/FVC ratio: 100 %
Pre FEV1/FVC ratio: 86 %
Pre FEV6/FVC Ratio: 100 %
RV % pred: 116 %
RV: 2.48 L
TLC % pred: 116 %
TLC: 5.21 L
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
## 2022-02-26 NOTE — Addendum Note (Signed)
Addended by: Brand Males on: 02/26/2022 05:23 PM   Modules accepted: Level of Service
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
## 2022-02-26 NOTE — Progress Notes (Signed)
Full PFT Performed Today  

## 2022-02-26 NOTE — Patient Instructions (Signed)
Full PFT Performed Today  

## 2022-02-27 NOTE — Telephone Encounter (Signed)
Will route to Dr. Chase Caller to discuss with patient when he returns to office.
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
## 2022-02-28 ENCOUNTER — Ambulatory Visit: Payer: Medicare Other | Admitting: Student

## 2022-03-02 ENCOUNTER — Encounter (HOSPITAL_COMMUNITY): Payer: Self-pay | Admitting: Emergency Medicine

## 2022-03-02 ENCOUNTER — Other Ambulatory Visit: Payer: Self-pay

## 2022-03-02 ENCOUNTER — Emergency Department (HOSPITAL_COMMUNITY)
Admission: EM | Admit: 2022-03-02 | Discharge: 2022-03-04 | Disposition: A | Discharge status: Home / Self Care | Payer: Medicare Other | Attending: Emergency Medicine | Admitting: Emergency Medicine

## 2022-03-02 DIAGNOSIS — Z79899 Other long term (current) drug therapy: Secondary | ICD-10-CM | POA: Diagnosis not present

## 2022-03-02 DIAGNOSIS — Z7982 Long term (current) use of aspirin: Secondary | ICD-10-CM | POA: Insufficient documentation

## 2022-03-02 DIAGNOSIS — Z20822 Contact with and (suspected) exposure to covid-19: Secondary | ICD-10-CM | POA: Insufficient documentation

## 2022-03-02 DIAGNOSIS — Z046 Encounter for general psychiatric examination, requested by authority: Secondary | ICD-10-CM | POA: Insufficient documentation

## 2022-03-02 DIAGNOSIS — R9431 Abnormal electrocardiogram [ECG] [EKG]: Secondary | ICD-10-CM | POA: Diagnosis not present

## 2022-03-02 DIAGNOSIS — F331 Major depressive disorder, recurrent, moderate: Secondary | ICD-10-CM | POA: Insufficient documentation

## 2022-03-02 DIAGNOSIS — R4689 Other symptoms and signs involving appearance and behavior: Secondary | ICD-10-CM

## 2022-03-02 LAB — CBC WITH DIFFERENTIAL/PLATELET
Abs Immature Granulocytes: 0.03 10*3/uL (ref 0.00–0.07)
Basophils Absolute: 0 10*3/uL (ref 0.0–0.1)
Basophils Relative: 0 %
Eosinophils Absolute: 0.2 10*3/uL (ref 0.0–0.5)
Eosinophils Relative: 2 %
HCT: 29.6 % — ABNORMAL LOW (ref 36.0–46.0)
Hemoglobin: 9.6 g/dL — ABNORMAL LOW (ref 12.0–15.0)
Immature Granulocytes: 0 %
Lymphocytes Relative: 22 %
Lymphs Abs: 2.3 10*3/uL (ref 0.7–4.0)
MCH: 29.9 pg (ref 26.0–34.0)
MCHC: 32.4 g/dL (ref 30.0–36.0)
MCV: 92.2 fL (ref 80.0–100.0)
Monocytes Absolute: 0.7 10*3/uL (ref 0.1–1.0)
Monocytes Relative: 7 %
Neutro Abs: 7 10*3/uL (ref 1.7–7.7)
Neutrophils Relative %: 69 %
Platelets: 257 10*3/uL (ref 150–400)
RBC: 3.21 MIL/uL — ABNORMAL LOW (ref 3.87–5.11)
RDW: 13.4 % (ref 11.5–15.5)
WBC: 10.2 10*3/uL (ref 4.0–10.5)
nRBC: 0 % (ref 0.0–0.2)

## 2022-03-02 LAB — BASIC METABOLIC PANEL
Anion gap: 7 (ref 5–15)
BUN: 14 mg/dL (ref 8–23)
CO2: 27 mmol/L (ref 22–32)
Calcium: 9 mg/dL (ref 8.9–10.3)
Chloride: 105 mmol/L (ref 98–111)
Creatinine, Ser: 1 mg/dL (ref 0.44–1.00)
GFR, Estimated: 58 mL/min — ABNORMAL LOW (ref >60)
Glucose, Bld: 134 mg/dL — ABNORMAL HIGH (ref 70–99)
Potassium: 2.9 mmol/L — ABNORMAL LOW (ref 3.5–5.1)
Sodium: 139 mmol/L (ref 135–145)

## 2022-03-02 LAB — URINALYSIS, ROUTINE W REFLEX MICROSCOPIC
Bilirubin Urine: NEGATIVE
Glucose, UA: NEGATIVE mg/dL
Ketones, ur: NEGATIVE mg/dL
Nitrite: NEGATIVE
Protein, ur: NEGATIVE mg/dL
Specific Gravity, Urine: 1.01 (ref 1.005–1.030)
pH: 6 (ref 5.0–8.0)

## 2022-03-02 LAB — ETHANOL: Alcohol, Ethyl (B): <10 mg/dL (ref <10)

## 2022-03-02 LAB — ACETAMINOPHEN LEVEL: Acetaminophen (Tylenol), Serum: <10 ug/mL — ABNORMAL LOW (ref 10–30)

## 2022-03-02 LAB — SALICYLATE LEVEL: Salicylate Lvl: <7 mg/dL — ABNORMAL LOW (ref 7.0–30.0)

## 2022-03-02 MED ORDER — VENLAFAXINE HCL ER 37.5 MG PO CP24
37.5000 mg | ORAL_CAPSULE | Freq: Every day | ORAL | Status: DC
  Administered 2022-03-03 – 2022-03-04 (×2): 37.5 mg via ORAL
  Filled 2022-03-02 (×2): qty 1

## 2022-03-02 MED ORDER — VITAMIN B-12 1000 MCG PO TABS
1500.0000 ug | ORAL_TABLET | Freq: Every day | ORAL | Status: DC
  Administered 2022-03-03 – 2022-03-04 (×2): 1500 ug via ORAL
  Filled 2022-03-02 (×2): qty 1

## 2022-03-02 MED ORDER — CLONAZEPAM 0.5 MG PO TABS
1.0000 mg | ORAL_TABLET | Freq: Three times a day (TID) | ORAL | Status: DC | PRN
  Administered 2022-03-03: 1 mg via ORAL
  Filled 2022-03-02: qty 2

## 2022-03-02 MED ORDER — PRAVASTATIN SODIUM 20 MG PO TABS
10.0000 mg | ORAL_TABLET | Freq: Every day | ORAL | Status: DC
  Administered 2022-03-03: 10 mg via ORAL
  Filled 2022-03-02: qty 1

## 2022-03-02 MED ORDER — COENZYME Q10 10 MG PO CAPS: ORAL_CAPSULE | Freq: Every evening | ORAL | Status: DC

## 2022-03-02 MED ORDER — LORATADINE 10 MG PO TABS
10.0000 mg | ORAL_TABLET | Freq: Every day | ORAL | Status: DC
  Administered 2022-03-03 – 2022-03-04 (×2): 10 mg via ORAL
  Filled 2022-03-02 (×2): qty 1

## 2022-03-02 MED ORDER — OXYCODONE-ACETAMINOPHEN 5-325 MG PO TABS
1.0000 | ORAL_TABLET | ORAL | Status: DC | PRN
  Administered 2022-03-02 – 2022-03-03 (×3): 1 via ORAL
  Filled 2022-03-02 (×3): qty 1

## 2022-03-02 MED ORDER — ALBUTEROL SULFATE HFA 108 (90 BASE) MCG/ACT IN AERS
2.0000 | INHALATION_SPRAY | Freq: Four times a day (QID) | RESPIRATORY_TRACT | Status: DC | PRN
  Administered 2022-03-03: 2 via RESPIRATORY_TRACT
  Filled 2022-03-02: qty 6.7

## 2022-03-02 MED ORDER — PANTOPRAZOLE SODIUM 40 MG PO TBEC
40.0000 mg | DELAYED_RELEASE_TABLET | Freq: Every day | ORAL | Status: DC
  Administered 2022-03-03 – 2022-03-04 (×2): 40 mg via ORAL
  Filled 2022-03-02 (×2): qty 1

## 2022-03-02 MED ORDER — CALCIUM 500 MG PO TABS: ORAL_TABLET | Freq: Every evening | ORAL | Status: DC

## 2022-03-02 MED ORDER — METOPROLOL SUCCINATE ER 25 MG PO TB24
12.5000 mg | ORAL_TABLET | Freq: Every day | ORAL | Status: DC
  Administered 2022-03-03 – 2022-03-04 (×2): 12.5 mg via ORAL
  Filled 2022-03-02: qty 1
  Filled 2022-03-02: qty 0.5

## 2022-03-02 MED ORDER — UMECLIDINIUM-VILANTEROL 62.5-25 MCG/ACT IN AEPB
1.0000 | INHALATION_SPRAY | Freq: Every day | RESPIRATORY_TRACT | Status: DC
  Filled 2022-03-02 (×2): qty 14

## 2022-03-02 MED ORDER — POTASSIUM CHLORIDE CRYS ER 20 MEQ PO TBCR
40.0000 meq | EXTENDED_RELEASE_TABLET | Freq: Once | ORAL | Status: AC
  Administered 2022-03-02: 40 meq via ORAL
  Filled 2022-03-02: qty 2

## 2022-03-02 MED ORDER — VITAMIN D 25 MCG (1000 UNIT) PO TABS
5000.0000 [IU] | ORAL_TABLET | Freq: Every evening | ORAL | Status: DC
  Administered 2022-03-03: 5000 [IU] via ORAL
  Filled 2022-03-02: qty 5

## 2022-03-02 MED ORDER — DOXEPIN HCL 25 MG PO CAPS
25.0000 mg | ORAL_CAPSULE | Freq: Every day | ORAL | Status: DC
  Administered 2022-03-02 – 2022-03-03 (×2): 25 mg via ORAL
  Filled 2022-03-02 (×2): qty 1

## 2022-03-02 MED ORDER — IPRATROPIUM BROMIDE 0.02 % IN SOLN: 0.5000 mg | Freq: Three times a day (TID) | RESPIRATORY_TRACT | Status: DC | PRN

## 2022-03-02 MED ORDER — ASPIRIN 81 MG PO TBEC
81.0000 mg | DELAYED_RELEASE_TABLET | Freq: Every day | ORAL | Status: DC
  Administered 2022-03-03 – 2022-03-04 (×2): 81 mg via ORAL
  Filled 2022-03-02 (×2): qty 1

## 2022-03-02 MED ORDER — LOSARTAN POTASSIUM 25 MG PO TABS
25.0000 mg | ORAL_TABLET | Freq: Every evening | ORAL | Status: DC
  Administered 2022-03-02 – 2022-03-03 (×2): 25 mg via ORAL
  Filled 2022-03-02 (×4): qty 1

## 2022-03-02 MED ORDER — CALCIUM CARBONATE 1250 (500 CA) MG PO TABS
1.0000 | ORAL_TABLET | Freq: Every day | ORAL | Status: DC
  Administered 2022-03-03: 1250 mg via ORAL
  Filled 2022-03-02 (×3): qty 1

## 2022-03-02 MED ORDER — VITAMIN C 500 MG PO TABS
500.0000 mg | ORAL_TABLET | Freq: Every evening | ORAL | Status: DC
  Administered 2022-03-03: 500 mg via ORAL
  Filled 2022-03-02 (×3): qty 1

## 2022-03-02 MED ORDER — MODAFINIL 200 MG PO TABS
200.0000 mg | ORAL_TABLET | Freq: Two times a day (BID) | ORAL | Status: DC
  Administered 2022-03-02 – 2022-03-04 (×4): 200 mg via ORAL
  Filled 2022-03-02 (×4): qty 1

## 2022-03-02 MED ORDER — ISOSORBIDE MONONITRATE ER 60 MG PO TB24
240.0000 mg | ORAL_TABLET | Freq: Every day | ORAL | Status: DC
  Administered 2022-03-03 – 2022-03-04 (×2): 240 mg via ORAL
  Filled 2022-03-02 (×2): qty 4

## 2022-03-02 MED ORDER — GABAPENTIN 300 MG PO CAPS
300.0000 mg | ORAL_CAPSULE | Freq: Every day | ORAL | Status: DC
  Administered 2022-03-02 – 2022-03-03 (×2): 300 mg via ORAL
  Filled 2022-03-02 (×2): qty 1

## 2022-03-02 MED ORDER — CLONAZEPAM 0.125 MG PO TBDP
0.2500 mg | ORAL_TABLET | Freq: Once | ORAL | Status: AC
  Administered 2022-03-02: 0.25 mg via ORAL
  Filled 2022-03-02: qty 2
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
## 2022-03-02 NOTE — Progress Notes (Signed)
PHARMACIST - PHYSICIAN ORDER COMMUNICATION  CONCERNING: P&T Medication Policy on Herbal Medications  DESCRIPTION:  This patient's order for:  Coenzyme Q10  has been noted.  This product(s) is classified as an "herbal" or natural product. Due to a lack of definitive safety studies or FDA approval, nonstandard manufacturing practices, plus the potential risk of unknown drug-drug interactions while on inpatient medications, the Pharmacy and Therapeutics Committee does not permit the use of "herbal" or natural products of this type within [facility].   ACTION TAKEN: The pharmacy department is unable to verify this order at this time and your patient has been informed of this safety policy. Please reevaluate patient's clinical condition at discharge and address if the herbal or natural product(s) should be resumed at that time.  Darina Hartwell, PharmD 

## 2022-03-02 NOTE — ED Notes (Signed)
Called lab to add UA

## 2022-03-02 NOTE — ED Triage Notes (Signed)
Upon arrival to the ED pt is alert and oriented , cooperative denis SI/HI

## 2022-03-03 LAB — RESP PANEL BY RT-PCR (FLU A&B, COVID) ARPGX2
Influenza A by PCR: NEGATIVE
Influenza B by PCR: NEGATIVE
SARS Coronavirus 2 by RT PCR: NEGATIVE

## 2022-03-03 LAB — RAPID URINE DRUG SCREEN, HOSP PERFORMED
Amphetamines: NOT DETECTED
Barbiturates: NOT DETECTED
Benzodiazepines: POSITIVE — AB
Cocaine: NOT DETECTED
Opiates: NOT DETECTED
Tetrahydrocannabinol: NOT DETECTED

## 2022-03-03 MED ORDER — GABAPENTIN 100 MG PO CAPS: 100.0000 mg | ORAL_CAPSULE | Freq: Two times a day (BID) | ORAL | Status: DC | PRN

## 2022-03-03 MED ORDER — CLONAZEPAM 0.5 MG PO TABS: 0.5000 mg | ORAL_TABLET | Freq: Three times a day (TID) | ORAL | Status: DC | PRN

## 2022-03-03 MED ORDER — CLONAZEPAM 1 MG PO TABS
1.0000 mg | ORAL_TABLET | Freq: Three times a day (TID) | ORAL | Status: DC | PRN
  Administered 2022-03-03 (×2): 1 mg via ORAL
  Filled 2022-03-03: qty 2
  Filled 2022-03-03: qty 1

## 2022-03-03 MED ORDER — QUETIAPINE FUMARATE 25 MG PO TABS
12.5000 mg | ORAL_TABLET | Freq: Once | ORAL | Status: DC
  Filled 2022-03-03: qty 1
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
## 2022-03-03 NOTE — Consult Note (Cosign Needed Addendum)
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 No focal deficit present.     Mental Status: She is alert and oriented to person, place, and time.    Review of Systems  Constitutional: Negative.   HENT: Negative.    Eyes: Negative.   Respiratory: Negative.    Cardiovascular: Negative.   Gastrointestinal: Negative.   Genitourinary: Negative.   Musculoskeletal:  Positive for myalgias.       Hx Fibromyalgia  Skin: Negative.   Neurological: Negative.   Endo/Heme/Allergies: Negative.    Psychiatric/Behavioral:  Positive for depression. The patient  is nervous/anxious.    Blood pressure (!) 147/70, pulse 86, temperature 97.8 F (36.6 C), temperature source Oral, resp. rate 17, SpO2 98 %. There is no height or weight on file to calculate BMI.  Medical Decision Making: Patient denies everything written in IVC document, she is refusing providers speak to her husband and that makes evaluation incomplete.  We will seek inpatient geropsych  unit hospitalization for further evaluation.  We have resumed her home medications.  SW consult to look into APS report to be in.  Patient denies SI/HI/AVH .  Problem 1: Recurrent Major Depressive disorder, moderate-severe without psychotic features  Disposition:  Seek admission at any Geropsychiatry unit  Delfin Gant, NP-PMHNP-BC 03/03/2022 2:33 PM

## 2022-03-03 NOTE — ED Notes (Signed)
Pt yelling at this nurse stating "I just want to use the damn phone." Pt instructed that she would not be able to use the phone when yelling at staff.
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
## 2022-03-03 NOTE — ED Notes (Signed)
Pt wandering the hallways. Having to be redirected by staff. Pt continuously talking to herself and yelling at staff.

## 2022-03-03 NOTE — ED Notes (Signed)
Pt walked to TCU area from [facility] along with two patient belongings bags that were placed in TCU locker #30. I then noticed pt had multiple pieces of jewelry on and discussed with her the policies of such. Pt agreed to remove one pair of earrings, two necklaces, and one watch. Unable to remove rings at this time due to finger joint swelling. Pt states these are not painful. Pt then became fixated on her money and phone. Pt allowed to count her money ($302) and see her cell phone. All previously stated items were placed in a secure pt belongings bag and given to security to be locked away. Security officer [place] transported to safe and paperwork with key placed in pt's chart.

## 2022-03-03 NOTE — ED Notes (Signed)
Pt refused seroquel. NP notified.

## 2022-03-03 NOTE — ED Notes (Signed)
Pt has a visitor at bedside.

## 2022-03-03 NOTE — ED Provider Notes (Signed)
  Physical Exam  BP (!) 147/70 (BP Location: Right Arm)   Pulse 86   Temp 97.8 F (36.6 C) (Oral)   Resp 17   SpO2 98%   Physical Exam  Procedures  Procedures  ED Course / MDM    Medical Decision Making Amount and/or Complexity of Data Reviewed Labs: ordered.  Risk OTC drugs. Prescription drug management.   Patient presented for agitation and suicidal statements.  Reviewing notes from psych states she does not safe for outpatient management but plan for reevaluation in the morning and overnight management.  This note was placed at around 8 in the morning and I am not sure if it meant reevaluation today or tomorrow since she was here yesterday but I sent a message to the note writer and had not heard back.  With inpatient treatment recommended will complete first evaluation.       Davonna Belling, MD 03/03/22 1429

## 2022-03-03 NOTE — ED Notes (Signed)
Pt made phone call.

## 2022-03-04 ENCOUNTER — Telehealth: Payer: Self-pay | Admitting: Internal Medicine

## 2022-03-04 ENCOUNTER — Telehealth: Payer: Self-pay | Admitting: Cardiology

## 2022-03-04 DIAGNOSIS — F331 Major depressive disorder, recurrent, moderate: Secondary | ICD-10-CM

## 2022-03-04 MED ORDER — DESVENLAFAXINE SUCCINATE ER 50 MG PO TB24: 50.0000 mg | ORAL_TABLET | Freq: Every day | ORAL | Status: DC

## 2022-03-04 NOTE — Telephone Encounter (Signed)
Patient states her husband took all her medications, tablets and inhalers. Patient uses [facility]. Would like more medication, but states she can stop all of them.   Please call patient back to discuss.

## 2022-03-04 NOTE — Telephone Encounter (Signed)
*  STAT* If patient is at the pharmacy, call can be transferred to refill team.   1. Which medications need to be refilled? (please list name of each medication and dose if known)   lovastatin (MEVACOR) 40 MG tablet metoprolol succinate (TOPROL-XL) 25 MG 24 hr tablet  2. Which pharmacy/location (including street and city if local pharmacy) is medication to be sent to?  [place], [address]  3. Do they need a 30 day or 90 day supply? 90 day  Patient stated she is completely out of these medications.
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
## 2022-03-04 NOTE — Telephone Encounter (Signed)
ATC LVMTCB x 1  

## 2022-03-04 NOTE — ED Notes (Addendum)
Pt alert and calm throughout the shift. Pt adhered to medication treatments. No agitation or aggression noted.

## 2022-03-04 NOTE — ED Provider Notes (Addendum)
Emergency Medicine Observation Re-evaluation Note  [name] is a 76 y.o. female, seen on rounds today.  Pt initially presented to the ED for complaints of Psychiatric Evaluation Currently, the patient is sleeping.  Physical Exam  BP (!) 146/74 (BP Location: Left Arm)   Pulse 88   Temp 97.7 F (36.5 C) (Oral)   Resp 18   SpO2 97%  Physical Exam General: Sleeping Cardiac: Extremities well-perfused Lungs: Breathing is unlabored Psych: Deferred  ED Course / MDM  EKG:EKG Interpretation  Date/Time:  Saturday March 02 2022 22:12:32 EDT Ventricular Rate:  80 PR Interval:  180 QRS Duration: 150 QT Interval:  472 QTC Calculation: 544 R Axis:   20 Text Interpretation: Normal sinus rhythm Left bundle branch block Abnormal ECG When compared with ECG of 08-May-2015 15:18, PREVIOUS ECG IS PRESENT Confirmed by Dene Gentry [phone]) on 03/02/2022 10:14:04 PM  I have reviewed the labs performed to date as well as medications administered while in observation.  Recent changes in the last 24 hours include TTS evaluation with recommendations for Geri-psych admission.  After further evaluation by TTS, patient has been psychiatrically cleared.  Patient to be discharged to return to her family.  Plan  Current plan is for discharge.    Godfrey Pick, MD 03/04/22 3888    Godfrey Pick, MD 03/04/22 1353

## 2022-03-04 NOTE — Progress Notes (Signed)
This CSW received a call back from [place] intake coordinator, Denman George, who informed that the APS report was screened out which means this pt will not be followed by APS in the community. TOC signing off.

## 2022-03-04 NOTE — Discharge Instructions (Signed)
Return to the emergency department for any new or worsening symptoms of concern.

## 2022-03-04 NOTE — ED Notes (Signed)
Patient discharged off unit to home per provider. Patient alert, no s/s of distress at this time. Discharge information and belongings given to the patient. Patient ambulatory off unit, escorted by NT.  Patient transported by family

## 2022-03-05 MED ORDER — LOVASTATIN 40 MG PO TABS: 40.0000 mg | ORAL_TABLET | Freq: Every day | ORAL | 1 refills | Status: DC

## 2022-03-05 MED ORDER — METOPROLOL SUCCINATE ER 25 MG PO TB24: 12.5000 mg | ORAL_TABLET | Freq: Every day | ORAL | 3 refills | Status: DC

## 2022-03-05 NOTE — Telephone Encounter (Signed)
Completed.

## 2022-03-06 NOTE — Telephone Encounter (Signed)
Called and there was no answer- LMTCB

## 2022-03-06 NOTE — Telephone Encounter (Signed)
Patient called back- I had a very hard time understanding patient. She did say that she got out of the hospital and passed with flying colors in 2 days.   Please advise.

## 2022-03-06 NOTE — Telephone Encounter (Signed)
Called and spoke with pt who states her spouse took her meds. Stated to pt that she should be able to call pharmacy to request a refill of meds and she verbalized understanding. Nothing further needed.

## 2022-03-07 ENCOUNTER — Ambulatory Visit: Payer: Medicare Other | Admitting: Cardiology

## 2022-03-07 NOTE — Progress Notes (Deleted)
Clinical Summary [name] is a 76 y.o.female  seen today for follow up of the following medical problems.      1. CAD  10/2017 cath: LM patent, LAD prox 40%, OM1 60%, patent RCA. Mean PA 17, PCWP 15, CI 2.9  Overall mild to moderate nonobstructive disease stable from 2012 cath - 10/2017 LVEF 50-55%, no WMAs, grade I diastolic dysfunction - 06/8561 Dr Einar Gip  nuclear stress: no clear ischemia - 2018 Dr Einar Gip echo: LVEF 35-40% - 02/2018 48 hr holter [facility]: benign atrial and ventricular ectopy       Toprol stopped she reports due to fatigue in the past, though unclear if was clearly related    01/2020 nuclear stress: septal infarct, small apical infarct with mild peri-infarct ischemia.  - some ongoing chest pain -3 weeks ago, left sided pain. Started left chest into shoudler/neck/face and down left side into leg. Burning sensation. +SOB Felt sweaty. NOt positional. Pain lasted about 10 minutes, took NG x 2. One other episode.  - reports significant stress, family related       2. COVID + COVID + 03/01/20       3. HTN -comppliant with meds     4. Hyperlipidemia -she is compliant with statin     5. Chronic LBBB   6. COPD - followed by pulmonary Dr Chase Caller    7.Depression - recent admit 03/2022 with major depressoin, HI towards husband   SH: Josph Macho is her brother, also a patient of mine.      Past Medical History:  Diagnosis Date   Anxiety    Aortic atherosclerosis ([place]) 09/29/2017   High Res CT in 9/18: aortic atherosclerosis; coronary artery calcification   Cervical disc disease    BULGING   Chronic combined systolic and diastolic CHF (congestive heart failure) ([place]) 09/29/2017   Echo 7/18: Moderate concentric LVH, grade 1 diastolic dysfunction, abnormal septal wall motion due to LBBB, moderate diffuse HK, EF 35-40, atrial septal aneurysm with possible PFO, mild TR // Echo 5/19: EF 50-55, normal wall motion, grade 1 diastolic dysfunction, trivial TR    Complication of anesthesia    SLOW TO AWAKEN AFTER LAST COLONSCOPY   Coronary artery disease 05/09/2015   LHC 10/12: EF 55, OM1 80-90 - difficult to engage >> treated medically // Nuc 8/18: EF 40, no ischemia   Depression    DJD (degenerative joint disease)    OA   Dysrhythmia    Not clear where this diagnosis came from   Emphysema of lung ([place])    Dr. Chase Caller   Fibromyalgia    H/O: liver disease 73 YRS AGO   tranamanitis due to previous interferon - LFTs improved off of   History of MI (myocardial infarction) FEW YRS AGO   History of TIA (transient ischemic attack)    Hx of colonic polyps    Hyperlipidemia    intol to statin - myalgias // ESPERION trial - patient stopped   Multiple sclerosis ([place])    Narcotic dependence ([place])    hx of benzodiazepine and narcotic   Osteoporosis    Positive H. pylori test    Urinary incontinence      Allergies  Allergen Reactions   Contrast Media [Iodinated Contrast Media] Rash and Other (See Comments)    Severe rash   Crestor [Rosuvastatin] Hives and Other (See Comments)    welps   Sulfonamide Derivatives Hives    As a child   Actonel [Risedronate]  Other reaction(s): chest pain   Doxycycline    Parathyroid Hormone (Recomb)     Other reaction(s): hair loss or weakness   Sulfamethoxazole Other (See Comments)     Current Outpatient Medications  Medication Sig Dispense Refill   albuterol (VENTOLIN HFA) 108 (90 Base) MCG/ACT inhaler INHALE 2 PUFFS INTO THE LUNGS EVERY 6 HOURS AS NEEDED FOR WHEEZING OR SHORTNESS OF BREATH 8.5 g 3   amoxicillin-clavulanate (AUGMENTIN) 875-125 MG tablet Take 1 tablet by mouth 2 (two) times daily. (Patient not taking: Reported on 03/02/2022) 14 tablet 0   ANORO ELLIPTA 62.5-25 MCG/ACT AEPB INAHLE 1 PUFF INTO THE LUNGS DAILY (Patient taking differently: Inhale 1 puff into the lungs daily.) 60 each 6   Ascorbic Acid (VITAMIN C) 500 MG CAPS Take 1 capsule by mouth every evening.     aspirin EC 81 MG  tablet Take 81 mg by mouth daily.     CALCIUM PO Take 1 tablet by mouth every evening.     Cholecalciferol (VITAMIN D3) 125 MCG (5000 UT) CAPS Take 5,000 Units by mouth every evening.     clonazePAM (KLONOPIN) 1 MG tablet Take 1 mg by mouth 3 (three) times daily as needed for anxiety.      COENZYME Q10 PO Take 1 capsule by mouth every evening.     diphenhydrAMINE (BENADRYL) 50 MG capsule Take 50 mg by mouth at bedtime as needed for sleep or allergies.     doxepin (SINEQUAN) 25 MG capsule Take 1 capsule (25 mg total) by mouth at bedtime. 30 capsule 5   fluticasone (FLONASE) 50 MCG/ACT nasal spray Place 2 sprays into both nostrils daily as needed for rhinitis or allergies.     gabapentin (NEURONTIN) 100 MG capsule Take 100 mg by mouth daily as needed (Pain).      gabapentin (NEURONTIN) 300 MG capsule TAKE 1 CAPSULE BY MOUTH DAILY (Patient taking differently: Take 300 mg by mouth at bedtime.) 30 capsule 7   GLATOPA 20 MG/ML SOSY injection USE 1 INJECTION SUBCUTANEOUSLY ONCE A DAY (Patient not taking: Reported on 03/02/2022) 30 mL 3   guaiFENesin (MUCINEX) 600 MG 12 hr tablet Take 600 mg by mouth 2 (two) times daily as needed for cough or to loosen phlegm.      Homeopathic Products (SIMILASAN DRY EYE RELIEF OP) Place 1 drop into both eyes 2 (two) times daily.     ipratropium (ATROVENT) 0.02 % nebulizer solution USE 2.5 MLS (0.5 MG TOTAL) BY NEBULIZER 4 TIMES DAILY (Patient taking differently: Take 0.5 mg by nebulization in the morning, at noon, in the evening, and at bedtime.) 300 mL 5   isosorbide mononitrate (IMDUR) 120 MG 24 hr tablet Take 240 mg by mouth daily.     loratadine (CLARITIN) 10 MG tablet Take 10 mg by mouth daily.     losartan (COZAAR) 25 MG tablet TAKE 1 TABLET BY MOUTH TWICE A DAY (Patient taking differently: Take 25 mg by mouth every evening.) 180 tablet 3   lovastatin (MEVACOR) 40 MG tablet Take 1 tablet (40 mg total) by mouth at bedtime. 90 tablet 1   metoprolol succinate  (TOPROL-XL) 25 MG 24 hr tablet Take 0.5 tablets (12.5 mg total) by mouth daily. 45 tablet 3   modafinil (PROVIGIL) 200 MG tablet Take 1 tablet (200 mg total) by mouth 2 (two) times daily. 60 tablet 0   mometasone (NASONEX) 50 MCG/ACT nasal spray SPRAY 2 SPRAYS INTO THE NOSE DAILY (Patient not taking: Reported on 03/02/2022) 17 g 5  Multiple Vitamin (MULTIVITAMIN WITH MINERALS) TABS tablet Take 1 tablet by mouth every evening.     nitroGLYCERIN (NITROSTAT) 0.4 MG SL tablet DISSOLVE 1 TABLET UNDER TONGUE AS NEEDEDFOR CHEST PAIN. MAY REPEAT 5 MINUTES APART 3 TIMES IF NEEDED (Patient taking differently: Place 0.4 mg under the tongue every 5 (five) minutes as needed for chest pain.) 25 tablet 3   omeprazole (PRILOSEC) 20 MG capsule TAKE 1 CAPSULE BY MOUTH ONCE DAILY 30 capsule 5   oxyCODONE-acetaminophen (PERCOCET) 5-325 MG per tablet Take 1 tablet by mouth every 4 (four) hours as needed for moderate pain or severe pain.     potassium chloride SA (KLOR-CON M20) 20 MEQ tablet Take 40 mg ( 2 tablets) daily for 4 days (Patient not taking: Reported on 03/02/2022) 8 tablet 0   PRISTIQ 50 MG 24 hr tablet Take 50 mg by mouth every evening.      torsemide (DEMADEX) 20 MG tablet TAKE 1 TABLET BY MOUTH AS NEEDED FOR SWELLING AND WEIGHT GAIN (Patient taking differently: Take 20 mg by mouth in the morning, at noon, and at bedtime.) 90 tablet 2   vitamin B-12 (CYANOCOBALAMIN) 1000 MCG tablet Take 1,500 mcg by mouth daily.     No current facility-administered medications for this visit.     Past Surgical History:  Procedure Laterality Date   BACK SURGERY  11-2009   LOWER BACK   BOTOX INJECTION N/A 03/21/2016   Procedure: BOTOX INJECTION;  Surgeon: Wilford Corner, MD;  Location: [facility];  Service: Endoscopy;  Laterality: N/A;   CARDIAC CATHETERIZATION     UNSUCCESSFUL STENT PLACEMENT   COLONSCOPY     ESOPHAGEAL MANOMETRY N/A 10/23/2015   Procedure: ESOPHAGEAL MANOMETRY (EM);  Surgeon: Wilford Corner, MD;   Location: [facility];  Service: Endoscopy;  Laterality: N/A;   ESOPHAGOGASTRODUODENOSCOPY (EGD) WITH PROPOFOL N/A 03/21/2016   Procedure: ESOPHAGOGASTRODUODENOSCOPY (EGD) WITH PROPOFOL;  Surgeon: Wilford Corner, MD;  Location: [facility];  Service: Endoscopy;  Laterality: N/A;   RIGHT/LEFT HEART CATH AND CORONARY ANGIOGRAPHY N/A 10/02/2017   Procedure: RIGHT/LEFT HEART CATH AND CORONARY ANGIOGRAPHY;  Surgeon: Nelva Bush, MD;  Location: [facility];  Service: Cardiovascular;  Laterality: N/A;   TUBAL LIGATION     VIDEO BRONCHOSCOPY Bilateral 05/18/2019   Procedure: VIDEO BRONCHOSCOPY WITHOUT FLUORO;  Surgeon: Brand Males, MD;  Location: [facility] ENDOSCOPY;  Service: Endoscopy;  Laterality: Bilateral;     Allergies  Allergen Reactions   Contrast Media [Iodinated Contrast Media] Rash and Other (See Comments)    Severe rash   Crestor [Rosuvastatin] Hives and Other (See Comments)    welps   Sulfonamide Derivatives Hives    As a child   Actonel [Risedronate]     Other reaction(s): chest pain   Doxycycline    Parathyroid Hormone (Recomb)     Other reaction(s): hair loss or weakness   Sulfamethoxazole Other (See Comments)      Family History  Problem Relation Age of Onset   Heart attack Mother 33   Heart attack Father 5   Cancer Brother      Social History Ms. Hojnacki reports that she quit smoking about 12 years ago. Her smoking use included cigarettes. She has a 48.00 pack-year smoking history. She has never used smokeless tobacco. Ms. Suttles reports no history of alcohol use.   Review of Systems CONSTITUTIONAL: No weight loss, fever, chills, weakness or fatigue.  HEENT: Eyes: No visual loss, blurred vision, double vision or yellow sclerae.No hearing loss, sneezing, congestion, runny nose  or sore throat.  SKIN: No rash or itching.  CARDIOVASCULAR:  RESPIRATORY: No shortness of breath, cough or sputum.  GASTROINTESTINAL: No anorexia, nausea, vomiting or  diarrhea. No abdominal pain or blood.  GENITOURINARY: No burning on urination, no polyuria NEUROLOGICAL: No headache, dizziness, syncope, paralysis, ataxia, numbness or tingling in the extremities. No change in bowel or bladder control.  MUSCULOSKELETAL: No muscle, back pain, joint pain or stiffness.  LYMPHATICS: No enlarged nodes. No history of splenectomy.  PSYCHIATRIC: No history of depression or anxiety.  ENDOCRINOLOGIC: No reports of sweating, cold or heat intolerance. No polyuria or polydipsia.  [name]   Physical Examination There were no vitals filed for this visit. There were no vitals filed for this visit.  Gen: resting comfortably, no acute distress HEENT: no scleral icterus, pupils equal round and reactive, no palptable cervical adenopathy,  CV Resp: Clear to auscultation bilaterally GI: abdomen is soft, non-tender, non-distended, normal bowel sounds, no hepatosplenomegaly MSK: extremities are warm, no edema.  Skin: warm, no rash Neuro:  no focal deficits Psych: appropriate affect   Diagnostic Studies 10/2017 echo Study Conclusions   - Left ventricle: The cavity size was normal. Systolic function was    normal. The estimated ejection fraction was in the range of 50%    to 55%. Wall motion was normal; there were no regional wall    motion abnormalities. There was an increased relative    contribution of atrial contraction to ventricular filling.    Doppler parameters are consistent with abnormal left ventricular    relaxation (grade 1 diastolic dysfunction). Doppler parameters    are consistent with high ventricular filling pressure.  - Ventricular septum: Septal motion showed paradox.  - Tricuspid valve: There was trivial regurgitation.    10/2017 LHC/RHC Conclusions: Mild to moderate, non-obstructive coronary artery disease involving the proximal/mid LAD and OM1.  Overall appearance is similar to prior catheterizations in 2012. Low normal to mildly reduced left  ventricular contraction (LVEF ~50%). Upper normal left and right heart filling pressures. Normal pulmonary artery pressure. Mildly elevated transpulmonary valve gradient. Normal Fick cardiac output/index.   01/2020 nuclear stress There was no ST segment deviation noted during stress. Findings consistent with prior septal myocardial infarction. Small apical infarct with mild peri-infarct ischemia. This is a low risk study. The left ventricular ejection fraction is normal (55-65%).    Assessment and Plan  1. CAD/Chest pain - recent low risk stress test. Prior cath 2 years ago without obstructive disease similar to 2012 cath -ongoing atypical symptoms that seem to improve with NG - increase imdur to 240mg  daily and monitor symptoms      Arnoldo Lenis, M.D., F.A.C.C.

## 2022-03-08 ENCOUNTER — Encounter: Payer: Self-pay | Admitting: Cardiology

## 2022-03-19 ENCOUNTER — Other Ambulatory Visit: Payer: Self-pay | Admitting: Neurology

## 2022-03-19 DIAGNOSIS — I10 Essential (primary) hypertension: Secondary | ICD-10-CM | POA: Diagnosis not present

## 2022-03-19 DIAGNOSIS — I2510 Atherosclerotic heart disease of native coronary artery without angina pectoris: Secondary | ICD-10-CM | POA: Diagnosis not present

## 2022-03-19 DIAGNOSIS — G35 Multiple sclerosis: Secondary | ICD-10-CM

## 2022-03-19 DIAGNOSIS — E782 Mixed hyperlipidemia: Secondary | ICD-10-CM | POA: Diagnosis not present

## 2022-03-19 DIAGNOSIS — J439 Emphysema, unspecified: Secondary | ICD-10-CM | POA: Diagnosis not present

## 2022-03-19 DIAGNOSIS — I5042 Chronic combined systolic (congestive) and diastolic (congestive) heart failure: Secondary | ICD-10-CM | POA: Diagnosis not present

## 2022-03-22 DIAGNOSIS — G894 Chronic pain syndrome: Secondary | ICD-10-CM | POA: Diagnosis not present

## 2022-03-22 DIAGNOSIS — M4726 Other spondylosis with radiculopathy, lumbar region: Secondary | ICD-10-CM | POA: Diagnosis not present

## 2022-03-22 DIAGNOSIS — M4722 Other spondylosis with radiculopathy, cervical region: Secondary | ICD-10-CM | POA: Diagnosis not present

## 2022-03-22 DIAGNOSIS — Z79891 Long term (current) use of opiate analgesic: Secondary | ICD-10-CM | POA: Diagnosis not present

## 2022-04-04 ENCOUNTER — Inpatient Hospital Stay: Admission: RE | Admit: 2022-04-04 | Payer: Medicare Other | Source: Ambulatory Visit

## 2022-04-09 ENCOUNTER — Telehealth: Payer: Self-pay | Admitting: Cardiology

## 2022-04-09 ENCOUNTER — Other Ambulatory Visit: Payer: Self-pay | Admitting: Family Medicine

## 2022-04-09 DIAGNOSIS — M810 Age-related osteoporosis without current pathological fracture: Secondary | ICD-10-CM

## 2022-04-09 MED ORDER — TORSEMIDE 20 MG PO TABS: 20.0000 mg | ORAL_TABLET | Freq: Every day | ORAL | 2 refills | Status: DC

## 2022-04-09 NOTE — Telephone Encounter (Signed)
*  STAT* If patient is at the pharmacy, call can be transferred to refill team.   1. Which medications need to be refilled? (please list name of each medication and dose if known) torsemide (DEMADEX) 20 MG tablet   2. Which pharmacy/location (including street and city if local pharmacy) is medication to be sent to? [facility]   3. Do they need a 30 day or 90 day supply? 90 day  Patient states she lost the medication.

## 2022-04-09 NOTE — Telephone Encounter (Signed)
Completed.

## 2022-04-17 ENCOUNTER — Other Ambulatory Visit: Payer: Self-pay | Admitting: Internal Medicine

## 2022-04-19 DIAGNOSIS — M4726 Other spondylosis with radiculopathy, lumbar region: Secondary | ICD-10-CM | POA: Diagnosis not present

## 2022-04-19 DIAGNOSIS — G894 Chronic pain syndrome: Secondary | ICD-10-CM | POA: Diagnosis not present

## 2022-04-19 DIAGNOSIS — M4722 Other spondylosis with radiculopathy, cervical region: Secondary | ICD-10-CM | POA: Diagnosis not present

## 2022-04-19 DIAGNOSIS — Z79891 Long term (current) use of opiate analgesic: Secondary | ICD-10-CM | POA: Diagnosis not present
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
## 2022-05-02 DIAGNOSIS — J439 Emphysema, unspecified: Secondary | ICD-10-CM | POA: Diagnosis not present

## 2022-05-02 DIAGNOSIS — I10 Essential (primary) hypertension: Secondary | ICD-10-CM | POA: Diagnosis not present

## 2022-05-02 DIAGNOSIS — E782 Mixed hyperlipidemia: Secondary | ICD-10-CM | POA: Diagnosis not present

## 2022-05-02 DIAGNOSIS — I5042 Chronic combined systolic (congestive) and diastolic (congestive) heart failure: Secondary | ICD-10-CM | POA: Diagnosis not present

## 2022-05-13 ENCOUNTER — Encounter: Payer: Self-pay | Admitting: Physician Assistant

## 2022-05-13 ENCOUNTER — Other Ambulatory Visit (HOSPITAL_COMMUNITY)
Admission: RE | Admit: 2022-05-13 | Discharge: 2022-05-13 | Disposition: A | Discharge status: Home / Self Care | Payer: Medicare Other | Source: Ambulatory Visit | Attending: Physician Assistant | Admitting: Physician Assistant

## 2022-05-13 ENCOUNTER — Ambulatory Visit: Payer: Medicare Other | Attending: Student | Admitting: Physician Assistant

## 2022-05-13 VITALS — BP 110/76 | HR 86 | Ht 61.0 in | Wt 115.0 lb

## 2022-05-13 DIAGNOSIS — I2510 Atherosclerotic heart disease of native coronary artery without angina pectoris: Secondary | ICD-10-CM

## 2022-05-13 DIAGNOSIS — E785 Hyperlipidemia, unspecified: Secondary | ICD-10-CM

## 2022-05-13 DIAGNOSIS — Z79899 Other long term (current) drug therapy: Secondary | ICD-10-CM | POA: Insufficient documentation

## 2022-05-13 DIAGNOSIS — I10 Essential (primary) hypertension: Secondary | ICD-10-CM

## 2022-05-13 LAB — BASIC METABOLIC PANEL
Anion gap: 10 (ref 5–15)
BUN: 17 mg/dL (ref 8–23)
CO2: 28 mmol/L (ref 22–32)
Calcium: 9 mg/dL (ref 8.9–10.3)
Chloride: 99 mmol/L (ref 98–111)
Creatinine, Ser: 1.01 mg/dL — ABNORMAL HIGH (ref 0.44–1.00)
GFR, Estimated: 57 mL/min — ABNORMAL LOW (ref >60)
Glucose, Bld: 90 mg/dL (ref 70–99)
Potassium: 3.6 mmol/L (ref 3.5–5.1)
Sodium: 137 mmol/L (ref 135–145)

## 2022-05-13 MED ORDER — ISOSORBIDE MONONITRATE ER 120 MG PO TB24: 120.0000 mg | ORAL_TABLET | Freq: Two times a day (BID) | ORAL | 3 refills | Status: DC

## 2022-05-13 MED ORDER — NITROGLYCERIN 0.4 MG SL SUBL: 0.4000 mg | SUBLINGUAL_TABLET | SUBLINGUAL | 2 refills | Status: DC | PRN

## 2022-05-13 NOTE — Patient Instructions (Signed)
Medication Instructions:  Take Isosorbide 120 mg TWICE a day  I refilled your NTG  Labwork: BMET   Testing/Procedures: None today  Follow-Up: 1 year  Any Other Special Instructions Will Be Listed Below (If Applicable).  If you need a refill on your cardiac medications before your next appointment, please call your pharmacy.

## 2022-05-15 ENCOUNTER — Other Ambulatory Visit: Payer: Self-pay | Admitting: Internal Medicine

## 2022-05-17 DIAGNOSIS — M4722 Other spondylosis with radiculopathy, cervical region: Secondary | ICD-10-CM | POA: Diagnosis not present

## 2022-05-17 DIAGNOSIS — M4726 Other spondylosis with radiculopathy, lumbar region: Secondary | ICD-10-CM | POA: Diagnosis not present

## 2022-05-17 DIAGNOSIS — G894 Chronic pain syndrome: Secondary | ICD-10-CM | POA: Diagnosis not present

## 2022-05-17 DIAGNOSIS — Z79891 Long term (current) use of opiate analgesic: Secondary | ICD-10-CM | POA: Diagnosis not present

## 2022-05-30 ENCOUNTER — Telehealth: Payer: Self-pay | Admitting: Neurology

## 2022-05-30 NOTE — Telephone Encounter (Signed)
Pt called in stating she got a letter stating the modafilil needs prior authorization before she can get anymore.

## 2022-05-31 ENCOUNTER — Other Ambulatory Visit (HOSPITAL_COMMUNITY): Payer: Self-pay

## 2022-05-31 NOTE — Telephone Encounter (Signed)
Pt last filled on 12.13.23. Insurance will not pay again at earliest until 1.5.24

## 2022-05-31 NOTE — Telephone Encounter (Signed)
Per patient wanted to get a early start on PA for January.

## 2022-06-04 ENCOUNTER — Telehealth: Payer: Self-pay

## 2022-06-04 ENCOUNTER — Other Ambulatory Visit (HOSPITAL_COMMUNITY): Payer: Self-pay

## 2022-06-04 NOTE — Telephone Encounter (Signed)
Per CoverMyMeds-PA was previously approved from 06/03/2022-06/03/2023.

## 2022-06-04 NOTE — Telephone Encounter (Signed)
Tried calling patient to advise, no answer. LMVOM

## 2022-06-12 ENCOUNTER — Other Ambulatory Visit: Payer: Self-pay | Admitting: Cardiology

## 2022-06-12 ENCOUNTER — Other Ambulatory Visit: Payer: Self-pay | Admitting: Neurology

## 2022-06-12 ENCOUNTER — Other Ambulatory Visit: Payer: Self-pay | Admitting: Internal Medicine

## 2022-06-12 DIAGNOSIS — G35 Multiple sclerosis: Secondary | ICD-10-CM

## 2022-06-12 MED ORDER — MODAFINIL 200 MG PO TABS: 200.0000 mg | ORAL_TABLET | Freq: Two times a day (BID) | ORAL | 1 refills | Status: DC

## 2022-06-16 ENCOUNTER — Emergency Department (HOSPITAL_COMMUNITY): Payer: 59

## 2022-06-16 ENCOUNTER — Encounter (HOSPITAL_COMMUNITY): Payer: Self-pay

## 2022-06-16 ENCOUNTER — Emergency Department (HOSPITAL_COMMUNITY)
Admission: EM | Admit: 2022-06-16 | Discharge: 2022-06-16 | Disposition: A | Discharge status: Home / Self Care | Payer: 59 | Attending: Emergency Medicine | Admitting: Emergency Medicine

## 2022-06-16 DIAGNOSIS — I499 Cardiac arrhythmia, unspecified: Secondary | ICD-10-CM | POA: Diagnosis not present

## 2022-06-16 DIAGNOSIS — I110 Hypertensive heart disease with heart failure: Secondary | ICD-10-CM | POA: Diagnosis not present

## 2022-06-16 DIAGNOSIS — I2510 Atherosclerotic heart disease of native coronary artery without angina pectoris: Secondary | ICD-10-CM | POA: Diagnosis not present

## 2022-06-16 DIAGNOSIS — R079 Chest pain, unspecified: Secondary | ICD-10-CM | POA: Diagnosis not present

## 2022-06-16 DIAGNOSIS — R6889 Other general symptoms and signs: Secondary | ICD-10-CM | POA: Diagnosis not present

## 2022-06-16 DIAGNOSIS — I509 Heart failure, unspecified: Secondary | ICD-10-CM | POA: Diagnosis not present

## 2022-06-16 DIAGNOSIS — R0789 Other chest pain: Secondary | ICD-10-CM | POA: Diagnosis not present

## 2022-06-16 DIAGNOSIS — Z743 Need for continuous supervision: Secondary | ICD-10-CM | POA: Diagnosis not present

## 2022-06-16 LAB — CBC WITH DIFFERENTIAL/PLATELET
Abs Immature Granulocytes: 0.01 10*3/uL (ref 0.00–0.07)
Basophils Absolute: 0 10*3/uL (ref 0.0–0.1)
Basophils Relative: 0 %
Eosinophils Absolute: 0.2 10*3/uL (ref 0.0–0.5)
Eosinophils Relative: 3 %
HCT: 34.2 % — ABNORMAL LOW (ref 36.0–46.0)
Hemoglobin: 11.5 g/dL — ABNORMAL LOW (ref 12.0–15.0)
Immature Granulocytes: 0 %
Lymphocytes Relative: 23 %
Lymphs Abs: 1.6 10*3/uL (ref 0.7–4.0)
MCH: 30.1 pg (ref 26.0–34.0)
MCHC: 33.6 g/dL (ref 30.0–36.0)
MCV: 89.5 fL (ref 80.0–100.0)
Monocytes Absolute: 0.6 10*3/uL (ref 0.1–1.0)
Monocytes Relative: 9 %
Neutro Abs: 4.4 10*3/uL (ref 1.7–7.7)
Neutrophils Relative %: 65 %
Platelets: 279 10*3/uL (ref 150–400)
RBC: 3.82 MIL/uL — ABNORMAL LOW (ref 3.87–5.11)
RDW: 13.5 % (ref 11.5–15.5)
WBC: 6.8 10*3/uL (ref 4.0–10.5)
nRBC: 0 % (ref 0.0–0.2)

## 2022-06-16 LAB — COMPREHENSIVE METABOLIC PANEL
ALT: 14 U/L (ref 0–44)
AST: 26 U/L (ref 15–41)
Albumin: 3.7 g/dL (ref 3.5–5.0)
Alkaline Phosphatase: 86 U/L (ref 38–126)
Anion gap: 8 (ref 5–15)
BUN: 21 mg/dL (ref 8–23)
CO2: 27 mmol/L (ref 22–32)
Calcium: 8.5 mg/dL — ABNORMAL LOW (ref 8.9–10.3)
Chloride: 99 mmol/L (ref 98–111)
Creatinine, Ser: 1.11 mg/dL — ABNORMAL HIGH (ref 0.44–1.00)
GFR, Estimated: 51 mL/min — ABNORMAL LOW (ref >60)
Glucose, Bld: 99 mg/dL (ref 70–99)
Potassium: 4 mmol/L (ref 3.5–5.1)
Sodium: 134 mmol/L — ABNORMAL LOW (ref 135–145)
Total Bilirubin: 0.4 mg/dL (ref 0.3–1.2)
Total Protein: 6.5 g/dL (ref 6.5–8.1)

## 2022-06-16 LAB — TROPONIN I (HIGH SENSITIVITY): Troponin I (High Sensitivity): 6 ng/L (ref <18)

## 2022-06-16 LAB — BRAIN NATRIURETIC PEPTIDE: B Natriuretic Peptide: 80.2 pg/mL (ref 0.0–100.0)
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
## 2022-06-16 NOTE — Discharge Instructions (Signed)
You have chosen to leave the emergency department AGAINST MEDICAL ADVICE.  I have explained to you your testing results and the need for further evaluation/admission.  You have verbalized understanding of these results and plan and are choosing to leave the hospital AGAINST MEDICAL ADVICE. You understand the risks associated with leaving without further evaluation/treatment. If you have any worsening symptoms or choose to be treated please return immediately to emergency department.
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
## 2022-06-17 DIAGNOSIS — Z79891 Long term (current) use of opiate analgesic: Secondary | ICD-10-CM | POA: Diagnosis not present

## 2022-06-17 DIAGNOSIS — M4722 Other spondylosis with radiculopathy, cervical region: Secondary | ICD-10-CM | POA: Diagnosis not present

## 2022-06-17 DIAGNOSIS — M4726 Other spondylosis with radiculopathy, lumbar region: Secondary | ICD-10-CM | POA: Diagnosis not present

## 2022-06-17 DIAGNOSIS — G894 Chronic pain syndrome: Secondary | ICD-10-CM | POA: Diagnosis not present

## 2022-06-24 DIAGNOSIS — R252 Cramp and spasm: Secondary | ICD-10-CM | POA: Diagnosis not present

## 2022-06-24 DIAGNOSIS — G35 Multiple sclerosis: Secondary | ICD-10-CM | POA: Diagnosis not present

## 2022-06-24 DIAGNOSIS — J439 Emphysema, unspecified: Secondary | ICD-10-CM | POA: Diagnosis not present

## 2022-07-11 DIAGNOSIS — I2510 Atherosclerotic heart disease of native coronary artery without angina pectoris: Secondary | ICD-10-CM | POA: Diagnosis not present

## 2022-07-11 DIAGNOSIS — I10 Essential (primary) hypertension: Secondary | ICD-10-CM | POA: Diagnosis not present

## 2022-07-11 DIAGNOSIS — E782 Mixed hyperlipidemia: Secondary | ICD-10-CM | POA: Diagnosis not present

## 2022-07-11 DIAGNOSIS — J449 Chronic obstructive pulmonary disease, unspecified: Secondary | ICD-10-CM | POA: Diagnosis not present

## 2022-07-11 DIAGNOSIS — I5042 Chronic combined systolic (congestive) and diastolic (congestive) heart failure: Secondary | ICD-10-CM | POA: Diagnosis not present

## 2022-07-15 ENCOUNTER — Other Ambulatory Visit: Payer: Self-pay | Admitting: Cardiology

## 2022-07-16 DIAGNOSIS — G894 Chronic pain syndrome: Secondary | ICD-10-CM | POA: Diagnosis not present

## 2022-07-16 DIAGNOSIS — M4722 Other spondylosis with radiculopathy, cervical region: Secondary | ICD-10-CM | POA: Diagnosis not present

## 2022-07-16 DIAGNOSIS — M4726 Other spondylosis with radiculopathy, lumbar region: Secondary | ICD-10-CM | POA: Diagnosis not present

## 2022-07-16 DIAGNOSIS — Z79891 Long term (current) use of opiate analgesic: Secondary | ICD-10-CM | POA: Diagnosis not present

## 2022-07-23 DIAGNOSIS — N390 Urinary tract infection, site not specified: Secondary | ICD-10-CM | POA: Diagnosis not present

## 2022-08-13 ENCOUNTER — Other Ambulatory Visit: Payer: Self-pay | Admitting: Neurology

## 2022-08-13 ENCOUNTER — Other Ambulatory Visit: Payer: Self-pay | Admitting: Cardiology

## 2022-08-13 ENCOUNTER — Other Ambulatory Visit: Payer: Self-pay | Admitting: Physician Assistant

## 2022-08-13 DIAGNOSIS — M4722 Other spondylosis with radiculopathy, cervical region: Secondary | ICD-10-CM | POA: Diagnosis not present

## 2022-08-13 DIAGNOSIS — M4726 Other spondylosis with radiculopathy, lumbar region: Secondary | ICD-10-CM | POA: Diagnosis not present

## 2022-08-13 DIAGNOSIS — G894 Chronic pain syndrome: Secondary | ICD-10-CM | POA: Diagnosis not present

## 2022-08-13 DIAGNOSIS — G35 Multiple sclerosis: Secondary | ICD-10-CM

## 2022-08-13 DIAGNOSIS — Z79891 Long term (current) use of opiate analgesic: Secondary | ICD-10-CM | POA: Diagnosis not present

## 2022-09-06 DIAGNOSIS — J439 Emphysema, unspecified: Secondary | ICD-10-CM | POA: Diagnosis not present

## 2022-09-06 DIAGNOSIS — I25118 Atherosclerotic heart disease of native coronary artery with other forms of angina pectoris: Secondary | ICD-10-CM | POA: Diagnosis not present

## 2022-09-06 DIAGNOSIS — I700 Atherosclerosis of aorta: Secondary | ICD-10-CM | POA: Diagnosis not present

## 2022-09-06 DIAGNOSIS — J449 Chronic obstructive pulmonary disease, unspecified: Secondary | ICD-10-CM | POA: Diagnosis not present

## 2022-09-06 DIAGNOSIS — G35 Multiple sclerosis: Secondary | ICD-10-CM | POA: Diagnosis not present

## 2022-09-06 DIAGNOSIS — E441 Mild protein-calorie malnutrition: Secondary | ICD-10-CM | POA: Diagnosis not present

## 2022-09-06 DIAGNOSIS — I5042 Chronic combined systolic (congestive) and diastolic (congestive) heart failure: Secondary | ICD-10-CM | POA: Diagnosis not present

## 2022-09-06 DIAGNOSIS — G8929 Other chronic pain: Secondary | ICD-10-CM | POA: Diagnosis not present

## 2022-09-06 DIAGNOSIS — Z6821 Body mass index (BMI) 21.0-21.9, adult: Secondary | ICD-10-CM | POA: Diagnosis not present

## 2022-09-11 ENCOUNTER — Other Ambulatory Visit: Payer: Self-pay | Admitting: Internal Medicine

## 2022-09-11 ENCOUNTER — Other Ambulatory Visit: Payer: Self-pay | Admitting: Neurology

## 2022-09-11 DIAGNOSIS — M4722 Other spondylosis with radiculopathy, cervical region: Secondary | ICD-10-CM | POA: Diagnosis not present

## 2022-09-11 DIAGNOSIS — G894 Chronic pain syndrome: Secondary | ICD-10-CM | POA: Diagnosis not present

## 2022-09-11 DIAGNOSIS — Z79891 Long term (current) use of opiate analgesic: Secondary | ICD-10-CM | POA: Diagnosis not present

## 2022-09-11 DIAGNOSIS — M4726 Other spondylosis with radiculopathy, lumbar region: Secondary | ICD-10-CM | POA: Diagnosis not present

## 2022-09-11 DIAGNOSIS — G35 Multiple sclerosis: Secondary | ICD-10-CM

## 2022-09-25 ENCOUNTER — Inpatient Hospital Stay: Admission: RE | Admit: 2022-09-25 | Payer: Medicare Other | Source: Ambulatory Visit

## 2022-10-03 NOTE — Patient Instructions (Addendum)
Pulmonary emphysema (HCC) with possible emerging exacerbation  - stable  plan - Continue anoro daily scheduled and do nebulizer as needed  - compliance important     Pulmonary nodules - small left upper lobe and right lower lobe  48 pack smoker  CT scan sept 2023 reported as stable   Plan - CT chest without contrast OCt 2024  Followup -  Oct 2024 but after CT chest
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
## 2022-10-04 ENCOUNTER — Ambulatory Visit: Payer: 59 | Admitting: Internal Medicine

## 2022-10-04 ENCOUNTER — Encounter: Payer: Self-pay | Admitting: Internal Medicine

## 2022-10-04 VITALS — BP 120/60 | HR 86 | Ht 61.0 in | Wt 113.2 lb

## 2022-10-04 DIAGNOSIS — R918 Other nonspecific abnormal finding of lung field: Secondary | ICD-10-CM | POA: Diagnosis not present

## 2022-10-04 DIAGNOSIS — J438 Other emphysema: Secondary | ICD-10-CM

## 2022-10-09 ENCOUNTER — Other Ambulatory Visit: Payer: Self-pay | Admitting: Internal Medicine

## 2022-10-09 ENCOUNTER — Other Ambulatory Visit: Payer: Self-pay | Admitting: Cardiology

## 2022-10-09 ENCOUNTER — Other Ambulatory Visit: Payer: Self-pay | Admitting: Neurology

## 2022-10-09 DIAGNOSIS — Z79891 Long term (current) use of opiate analgesic: Secondary | ICD-10-CM | POA: Diagnosis not present

## 2022-10-09 DIAGNOSIS — G894 Chronic pain syndrome: Secondary | ICD-10-CM | POA: Diagnosis not present

## 2022-10-09 DIAGNOSIS — M4726 Other spondylosis with radiculopathy, lumbar region: Secondary | ICD-10-CM | POA: Diagnosis not present

## 2022-10-09 DIAGNOSIS — M4722 Other spondylosis with radiculopathy, cervical region: Secondary | ICD-10-CM | POA: Diagnosis not present

## 2022-11-06 DIAGNOSIS — Z79891 Long term (current) use of opiate analgesic: Secondary | ICD-10-CM | POA: Diagnosis not present

## 2022-11-06 DIAGNOSIS — M4722 Other spondylosis with radiculopathy, cervical region: Secondary | ICD-10-CM | POA: Diagnosis not present

## 2022-11-06 DIAGNOSIS — M4726 Other spondylosis with radiculopathy, lumbar region: Secondary | ICD-10-CM | POA: Diagnosis not present

## 2022-11-06 DIAGNOSIS — G894 Chronic pain syndrome: Secondary | ICD-10-CM | POA: Diagnosis not present

## 2022-11-14 DIAGNOSIS — T50995A Adverse effect of other drugs, medicaments and biological substances, initial encounter: Secondary | ICD-10-CM | POA: Diagnosis not present

## 2022-11-14 DIAGNOSIS — R040 Epistaxis: Secondary | ICD-10-CM | POA: Diagnosis not present

## 2022-11-14 DIAGNOSIS — J0100 Acute maxillary sinusitis, unspecified: Secondary | ICD-10-CM | POA: Diagnosis not present

## 2022-11-27 ENCOUNTER — Other Ambulatory Visit: Payer: Self-pay | Admitting: Neurology

## 2022-11-27 DIAGNOSIS — G35 Multiple sclerosis: Secondary | ICD-10-CM

## 2022-12-02 ENCOUNTER — Other Ambulatory Visit: Payer: Self-pay | Admitting: Neurology

## 2022-12-02 DIAGNOSIS — G35 Multiple sclerosis: Secondary | ICD-10-CM

## 2022-12-03 ENCOUNTER — Other Ambulatory Visit: Payer: Self-pay | Admitting: Neurology

## 2022-12-03 DIAGNOSIS — G35 Multiple sclerosis: Secondary | ICD-10-CM

## 2022-12-03 MED ORDER — MODAFINIL 200 MG PO TABS: 200.0000 mg | ORAL_TABLET | Freq: Two times a day (BID) | ORAL | 3 refills | Status: DC

## 2022-12-09 DIAGNOSIS — Z79891 Long term (current) use of opiate analgesic: Secondary | ICD-10-CM | POA: Diagnosis not present

## 2022-12-09 DIAGNOSIS — M4722 Other spondylosis with radiculopathy, cervical region: Secondary | ICD-10-CM | POA: Diagnosis not present

## 2022-12-09 DIAGNOSIS — M4726 Other spondylosis with radiculopathy, lumbar region: Secondary | ICD-10-CM | POA: Diagnosis not present

## 2022-12-09 DIAGNOSIS — G894 Chronic pain syndrome: Secondary | ICD-10-CM | POA: Diagnosis not present

## 2022-12-11 DIAGNOSIS — G35 Multiple sclerosis: Secondary | ICD-10-CM | POA: Diagnosis not present

## 2022-12-11 DIAGNOSIS — J449 Chronic obstructive pulmonary disease, unspecified: Secondary | ICD-10-CM | POA: Diagnosis not present

## 2022-12-11 DIAGNOSIS — Z0000 Encounter for general adult medical examination without abnormal findings: Secondary | ICD-10-CM | POA: Diagnosis not present

## 2022-12-11 DIAGNOSIS — D692 Other nonthrombocytopenic purpura: Secondary | ICD-10-CM | POA: Diagnosis not present

## 2022-12-11 DIAGNOSIS — E782 Mixed hyperlipidemia: Secondary | ICD-10-CM | POA: Diagnosis not present

## 2022-12-11 DIAGNOSIS — I509 Heart failure, unspecified: Secondary | ICD-10-CM | POA: Diagnosis not present

## 2022-12-25 DIAGNOSIS — H2513 Age-related nuclear cataract, bilateral: Secondary | ICD-10-CM | POA: Diagnosis not present

## 2022-12-25 DIAGNOSIS — H4720 Unspecified optic atrophy: Secondary | ICD-10-CM | POA: Diagnosis not present

## 2022-12-30 ENCOUNTER — Other Ambulatory Visit: Payer: Self-pay | Admitting: Internal Medicine

## 2023-01-06 ENCOUNTER — Telehealth: Payer: Self-pay | Admitting: Cardiology

## 2023-01-06 NOTE — Telephone Encounter (Signed)
Patient is requesting to switch from Dr. Wyline Mood to Dr. Mariah Milling because she lives in [place]. Please advise.

## 2023-01-07 DIAGNOSIS — Z79891 Long term (current) use of opiate analgesic: Secondary | ICD-10-CM | POA: Diagnosis not present

## 2023-01-07 DIAGNOSIS — G894 Chronic pain syndrome: Secondary | ICD-10-CM | POA: Diagnosis not present

## 2023-01-07 DIAGNOSIS — M4726 Other spondylosis with radiculopathy, lumbar region: Secondary | ICD-10-CM | POA: Diagnosis not present

## 2023-01-07 DIAGNOSIS — M4722 Other spondylosis with radiculopathy, cervical region: Secondary | ICD-10-CM | POA: Diagnosis not present

## 2023-01-29 ENCOUNTER — Other Ambulatory Visit: Payer: Self-pay | Admitting: Internal Medicine

## 2023-01-29 ENCOUNTER — Other Ambulatory Visit: Payer: Self-pay | Admitting: Cardiology

## 2023-02-06 DIAGNOSIS — M4722 Other spondylosis with radiculopathy, cervical region: Secondary | ICD-10-CM | POA: Diagnosis not present

## 2023-02-06 DIAGNOSIS — G894 Chronic pain syndrome: Secondary | ICD-10-CM | POA: Diagnosis not present

## 2023-02-06 DIAGNOSIS — M4726 Other spondylosis with radiculopathy, lumbar region: Secondary | ICD-10-CM | POA: Diagnosis not present

## 2023-02-06 DIAGNOSIS — Z79891 Long term (current) use of opiate analgesic: Secondary | ICD-10-CM | POA: Diagnosis not present

## 2023-02-07 NOTE — Progress Notes (Unsigned)
NEUROLOGY FOLLOW UP OFFICE NOTE  Audrey TRIMBOLI [id number]  Assessment/Plan:   Multiple sclerosis Essential tremor    DMT:  none Provigil 200mg  daily Gabapentin 100mg  daily as needed and 300mg  at night Follow up one year  Subjective:  [name] is a 77 year old right-handed female with CAD, COPD, emphysema, CHF, thyroid disease, IBS, arthritis, depression, fibromyalgia who follows up for multiple sclerosis.   UPDATE: Current DMT: none Current medications:  Gabapentin 300mg  at night and 100mg  during day if needed. Clonazepam 1mg  during day and 2mg  at bedtime for spasms D3 2000 IU daily Provigil 200mg  Doxepin 25mg  QHS     Vision:  Permanent vision loss in left eye Motor:  Stable.  Intermittent head tremor.  Sometimes body tremor as well.  However, it is not daily and she feels it is manageable at this time.   Sensory:  stable Pain:  Pain in arms and legs.  Lumbar radiculopathy Gait:  Sometimes requires ambulation with cane or walker.   Bowel/Bladder:  Stable Fatigue:  Yes Cognition:  Short-term memory deficits. Mood:  Depressed. She reports difficulty with speech.  She has trouble with pronouncing them.  It has been ongoing for several months but has gotten worse.     HISTORY: First symptom occurred in 1995, when she developed vision loss in the left eye, as well as left eye pain.  MRI of the brain with and without contrast was performed on 03/28/95 revealed 10-12 periventricular white matter lesions and one lesion in the corpus callosum, suspicious for demyelination.  There was also enhancement of the left optic nerve.  She was referred to Dr. Harriette Bouillon, an MS specialist.  Lumbar puncture was performed, revealing no oligoclonal bands or myelin basic protein.  She has not required hospitalization or steroids since the late 1990s.  She was on Betaseron and later Copaxone.  Copaxone was discontinued due to longstanding stability, but due to fatigue and cognitive  issues, it was restarted in 2014.  Her continued symptoms are episodes of worsening fatigue, as well as cognitive issues.  Sometimes, she has difficulty thinking clearly.  She will get disoriented while driving.  Other times, she has word-finding difficulties.  She once overpaid a bill.  She still performs all ADLs, including paying the bills.  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  Sometimes, when she feels symptoms are worse, she sees a white "half a doughnut" shape in her visual field.     Past disease-modifying agents:  Betaseron (elevated LFTs and necrotic injection sites), Copaxone   For fatigue, she takes Provigil.  Adderal was ineffective.  Amantadine was ineffective. For pain in the arms and legs, she takes gabapentin 300mg  at bedtime.  She reportedly has lumbar radiculopathy which also contributes to the leg pain. Initially, she reportedly had significant spasticity.  She was on oral baclofen which did not help.  At one point, a baclofen pump was entertained.  She currently takes clonazepam 1mg  during the day and 2mg  at bedtime, which is effective.   Other imaging: 05/07/95 MRI LUMBAR WO:  mild disc bulging at L4-5 without disc herniation or central canal stenosis. 03/09/96 MRI BRAIN W/WO (comparing to MRI from 03/28/95):  slight increase in number of white matter lesions adjacent to the atrium of the right lateral ventricle, otherwise unchanged. 03/28/98 MRI BRAIN W/WO (comparing to MRI from 03/25/97):  several subtle areas of increased signal in the deep white matter, not as prevalent as on  prior study.  No abnormal enhancement. 08/11/13 MRI of brain with and without contrast :  multiple supratentorial and infratentorial T2 hyperintensities, but no post-contrast enhancement.  However, this was not compared to prior imaging. 01/28/14 MRI Cervical spine without contrast:  spinal cord unremarkable.[name] MRI of brain with and without contrast from 08/17/14 is  stable MRI of brain without contrast from 05/08/15 is stable. MRI of brain with and without contrast from 03/04/17 unchanged from prior study on 05/08/15.  PAST MEDICAL HISTORY: Past Medical History:  Diagnosis Date   Anxiety    Aortic atherosclerosis (HCC) 09/29/2017   High Res CT in 9/18: aortic atherosclerosis; coronary artery calcification   Cervical disc disease    BULGING   Chronic combined systolic and diastolic CHF (congestive heart failure) (HCC) 09/29/2017   Echo 7/18: Moderate concentric LVH, grade 1 diastolic dysfunction, abnormal septal wall motion due to LBBB, moderate diffuse HK, EF 35-40, atrial septal aneurysm with possible PFO, mild TR // Echo 5/19: EF 50-55, normal wall motion, grade 1 diastolic dysfunction, trivial TR   Complication of anesthesia    SLOW TO AWAKEN AFTER LAST COLONSCOPY   Coronary artery disease 05/09/2015   LHC 10/12: EF 55, OM1 80-90 - difficult to engage >> treated medically // Nuc 8/18: EF 40, no ischemia   Depression    DJD (degenerative joint disease)    OA   Dysrhythmia    Not clear where this diagnosis came from   Emphysema of lung (HCC)    Dr. Marchelle Gearing   Fibromyalgia    H/O: liver disease 32 YRS AGO   tranamanitis due to previous interferon - LFTs improved off of   History of MI (myocardial infarction) FEW YRS AGO   History of TIA (transient ischemic attack)    Hx of colonic polyps    Hyperlipidemia    intol to statin - myalgias // ESPERION trial - patient stopped   Multiple sclerosis (HCC)    Narcotic dependence (HCC)    hx of benzodiazepine and narcotic   Osteoporosis    Positive H. pylori test    Urinary incontinence     MEDICATIONS: Current Outpatient Medications on File Prior to Visit  Medication Sig Dispense Refill   albuterol (VENTOLIN HFA) 108 (90 Base) MCG/ACT inhaler INHALE TWO PUFFS INTO THE LUNGS EVERY SIX HOURS AS NEEDED FOR WHEEZING OR SHORTNESS OF BREATH 8.5 g 3   amoxicillin-clavulanate (AUGMENTIN) 875-125 MG tablet  Take 1 tablet by mouth 2 (two) times daily. (Patient not taking: Reported on 03/02/2022) 14 tablet 0   Ascorbic Acid (VITAMIN C) 500 MG CAPS Take 1 capsule by mouth every evening.     aspirin EC 81 MG tablet Take 81 mg by mouth daily.     CALCIUM PO Take 1 tablet by mouth every evening.     Cholecalciferol (VITAMIN D3) 125 MCG (5000 UT) CAPS Take 5,000 Units by mouth every evening.     clonazePAM (KLONOPIN) 1 MG tablet Take 1 mg by mouth 3 (three) times daily as needed for anxiety.      COENZYME Q10 PO Take 1 capsule by mouth every evening.     diphenhydrAMINE (BENADRYL) 50 MG capsule Take 50 mg by mouth at bedtime as needed for sleep or allergies.     doxepin (SINEQUAN) 25 MG capsule Take 1 capsule (25 mg total) by mouth at bedtime. 30 capsule 5   fluticasone (FLONASE) 50 MCG/ACT nasal spray Place 2 sprays into both nostrils daily as needed for rhinitis or allergies.  gabapentin (NEURONTIN) 100 MG capsule Take 100 mg by mouth daily as needed (Pain).      gabapentin (NEURONTIN) 300 MG capsule TAKE ONE CAPSULE BY MOUTH DAILY 30 capsule 7   GLATOPA 20 MG/ML SOSY injection USE 1 INJECTION SUBCUTANEOUSLY ONCE A DAY 30 mL 3   guaiFENesin (MUCINEX) 600 MG 12 hr tablet Take 600 mg by mouth 2 (two) times daily as needed for cough or to loosen phlegm.      Homeopathic Products (SIMILASAN DRY EYE RELIEF OP) Place 1 drop into both eyes 2 (two) times daily.     ipratropium (ATROVENT) 0.02 % nebulizer solution USE TWO AND A HALF (2 & 1/2) MLS BY NEBULIZER FOUR TIMES DAILY 300 mL 5   isosorbide mononitrate (IMDUR) 120 MG 24 hr tablet Take 1 tablet (120 mg total) by mouth in the morning and at bedtime. 180 tablet 3   loratadine (CLARITIN) 10 MG tablet Take 10 mg by mouth daily.     losartan (COZAAR) 25 MG tablet TAKE ONE TABLET BY MOUTH TWICE A DAY 180 tablet 0   lovastatin (MEVACOR) 40 MG tablet TAKE ONE TABLET BY MOUTH EVERY NIGHT AT BEDTIME 90 tablet 1   metoprolol succinate (TOPROL-XL) 25 MG 24 hr tablet  TAKE ONE HALF (1/2) TABLET BY MOUTH DAILY 45 tablet 3   modafinil (PROVIGIL) 200 MG tablet Take 1 tablet (200 mg total) by mouth 2 (two) times daily. 60 tablet 3   mometasone (NASONEX) 50 MCG/ACT nasal spray SPRAY 2 SPRAYS INTO THE NOSE DAILY 17 g 5   Multiple Vitamin (MULTIVITAMIN WITH MINERALS) TABS tablet Take 1 tablet by mouth every evening.     nitroGLYCERIN (NITROSTAT) 0.4 MG SL tablet DISSOLVE ONE TABLET UNDER TONGUE AS NEEDED FOR CHEST PAIN. MAY REPEAT FIVE MINUTES APART THREE TIMES IF NEEDED 25 tablet 3   omeprazole (PRILOSEC) 20 MG capsule TAKE ONE CAPSULE BY MOUTH ONCE DAILY 30 capsule 5   oxyCODONE-acetaminophen (PERCOCET) 5-325 MG per tablet Take 1 tablet by mouth every 4 (four) hours as needed for moderate pain or severe pain.     potassium chloride SA (KLOR-CON M20) 20 MEQ tablet Take 40 mg ( 2 tablets) daily for 4 days 8 tablet 0   PRISTIQ 50 MG 24 hr tablet Take 50 mg by mouth every evening.      torsemide (DEMADEX) 20 MG tablet TAKE ONE TABLET BY MOUTH AS NEEDED FOR SWELLING AND WEIGHT GAIN 90 tablet 0   umeclidinium-vilanterol (ANORO ELLIPTA) 62.5-25 MCG/ACT AEPB INAHLE ONE PUFF INTO THE LUNGS DAILY 60 each 6   vitamin B-12 (CYANOCOBALAMIN) 1000 MCG tablet Take 1,500 mcg by mouth daily.     No current facility-administered medications on file prior to visit.    ALLERGIES: Allergies  Allergen Reactions   Contrast Media [Iodinated Contrast Media] Rash and Other (See Comments)    Severe rash   Crestor [Rosuvastatin] Hives and Other (See Comments)    welps   Sulfonamide Derivatives Hives    As a child   Actonel [Risedronate]     Other reaction(s): chest pain   Doxycycline    Parathyroid Hormone (Recomb)     Other reaction(s): hair loss or weakness   Sulfamethoxazole Other (See Comments)    FAMILY HISTORY: Family History  Problem Relation Age of Onset   Heart attack Mother 27   Heart attack Father 52   Cancer Brother       Objective:  Blood pressure 97/68,  pulse 98, height 5\' 1"  (1.549 m),  weight 116 lb 12.8 oz (53 kg), SpO2 96%. General: No acute distress.  Patient appears well-groomed.   Head:  Normocephalic/atraumatic Eyes:  Fundi examined but not visualized Neck: supple, no paraspinal tenderness, full range of motion Heart:  Regular rate and rhythm Neurological Exam: alert and oriented to person, place, and time. Speech fluent and not dysarthric, language intact.  Left monocular vision loss.  Otherwise, CN II-XII intact. Bulk and tone normal, muscle strength 5/5 throughout.  Head tremor.  Left greater than right intention tremor in hands.  Sensation to light touch, temperature and vibration intact.  Deep tendon reflexes 3+ throughout  Finger to nose and heel to shin testing intact.  Gait steady; Romberg negative.      Shon Millet, DO  CC: Deatra James, MD

## 2023-02-10 ENCOUNTER — Encounter: Payer: Self-pay | Admitting: Neurology

## 2023-02-10 ENCOUNTER — Ambulatory Visit: Payer: 59 | Admitting: Neurology

## 2023-02-10 VITALS — BP 97/68 | HR 98 | Ht 61.0 in | Wt 116.8 lb

## 2023-02-10 DIAGNOSIS — G250 Essential tremor: Secondary | ICD-10-CM | POA: Diagnosis not present

## 2023-02-10 DIAGNOSIS — G35 Multiple sclerosis: Secondary | ICD-10-CM | POA: Diagnosis not present

## 2023-02-10 MED ORDER — MODAFINIL 200 MG PO TABS: 200.0000 mg | ORAL_TABLET | Freq: Two times a day (BID) | ORAL | 3 refills | Status: DC

## 2023-02-10 MED ORDER — GABAPENTIN 300 MG PO CAPS: 300.0000 mg | ORAL_CAPSULE | Freq: Every day | ORAL | 5 refills | Status: DC

## 2023-02-10 MED ORDER — GABAPENTIN 100 MG PO CAPS: 100.0000 mg | ORAL_CAPSULE | Freq: Every day | ORAL | 5 refills | Status: DC | PRN

## 2023-02-10 NOTE — Patient Instructions (Addendum)
Gabapentin 100mg  during day as needed and 300mg  at nigth Provigil 200mg  daily Follow up in 1 year

## 2023-02-18 ENCOUNTER — Other Ambulatory Visit: Payer: Self-pay | Admitting: Cardiology

## 2023-03-06 DIAGNOSIS — M4722 Other spondylosis with radiculopathy, cervical region: Secondary | ICD-10-CM | POA: Diagnosis not present

## 2023-03-06 DIAGNOSIS — M4726 Other spondylosis with radiculopathy, lumbar region: Secondary | ICD-10-CM | POA: Diagnosis not present

## 2023-03-06 DIAGNOSIS — Z79891 Long term (current) use of opiate analgesic: Secondary | ICD-10-CM | POA: Diagnosis not present

## 2023-03-06 DIAGNOSIS — G894 Chronic pain syndrome: Secondary | ICD-10-CM | POA: Diagnosis not present

## 2023-03-10 ENCOUNTER — Other Ambulatory Visit: Payer: Self-pay | Admitting: Physician Assistant

## 2023-03-12 ENCOUNTER — Telehealth: Payer: Self-pay | Admitting: Internal Medicine

## 2023-03-12 NOTE — Telephone Encounter (Signed)
Spoke with the pt  She is c/o dry cough at night for the past few nights  She is not wheezing or having increased SOB  She has tried delsym  Denies any fevers, aches  Asking for cough med  She is still taking her anoro and albuterol No appts available within next several wks Please advise, thanks!  Allergies  Allergen Reactions   Contrast Media [Iodinated Contrast Media] Rash and Other (See Comments)    Severe rash   Crestor [Rosuvastatin] Hives and Other (See Comments)    welps   Sulfonamide Derivatives Hives    As a child   Actonel [Risedronate]     Other reaction(s): chest pain   Doxycycline    Parathyroid Hormone (Recomb)     Other reaction(s): hair loss or weakness   Sulfamethoxazole Other (See Comments)

## 2023-03-12 NOTE — Telephone Encounter (Signed)
Patient states having symptom of cough. Pharmacy is [facility]. Patient phone number is [phone].

## 2023-03-13 MED ORDER — PREDNISONE 10 MG PO TABS: ORAL_TABLET | ORAL | 0 refills | Status: DC

## 2023-03-13 NOTE — Telephone Encounter (Signed)
I am okay with her trying a short course of prednisone which should be low risk in the setting of bony issues of osteoporosis and previous rib fractures.   Please take prednisone 40 mg x1 day, then 30 mg x1 day, then 20 mg x1 day, then 10 mg x1 day, and then 5 mg x1 day and stop

## 2023-03-13 NOTE — Telephone Encounter (Signed)
I spoke with the pt and notified of response per Dr Conni Elliot  Rx sent to pharm  Nothing further needed

## 2023-03-21 ENCOUNTER — Telehealth: Payer: Self-pay | Admitting: Cardiovascular Disease

## 2023-03-21 NOTE — Telephone Encounter (Signed)
Patient is asking to speak to the nurse to discuss some issues she is having. Please advise

## 2023-03-21 NOTE — Telephone Encounter (Signed)
Spoke to pt who stated that she was having intense pain in her ribs 2 weeks ago and decided to call EMS who performed EKG. Pt stated that EKG showed LBBB. Patient is concerned about this diagnosis. Pt stated that she is not having any cp at this time, but is loaded with stress d/t a pending divorce. Pt is questioning if there is anything that can be done for her LBBB.  Please advise.

## 2023-03-21 NOTE — Telephone Encounter (Signed)
Patient notified and verbalized understanding. Pt had no further questions or concerns at this time.  ?

## 2023-03-22 ENCOUNTER — Other Ambulatory Visit: Payer: Self-pay

## 2023-03-22 ENCOUNTER — Emergency Department (HOSPITAL_COMMUNITY)
Admission: EM | Admit: 2023-03-22 | Discharge: 2023-03-22 | Disposition: A | Discharge status: Against Medical Advice | Payer: 59 | Attending: Emergency Medicine | Admitting: Emergency Medicine

## 2023-03-22 DIAGNOSIS — Z5321 Procedure and treatment not carried out due to patient leaving prior to being seen by health care provider: Secondary | ICD-10-CM | POA: Insufficient documentation

## 2023-03-22 DIAGNOSIS — M7918 Myalgia, other site: Secondary | ICD-10-CM | POA: Insufficient documentation

## 2023-03-22 NOTE — ED Triage Notes (Signed)
Pt presenting to the ER hysterical. Pt keeps repeating that she is burning up. Pt states that she feels it inside from head to toe. Daughter states the pt was complaining that she didn't feel good prior to arrival.

## 2023-03-23 DIAGNOSIS — R52 Pain, unspecified: Secondary | ICD-10-CM | POA: Diagnosis not present

## 2023-03-26 ENCOUNTER — Ambulatory Visit (HOSPITAL_BASED_OUTPATIENT_CLINIC_OR_DEPARTMENT_OTHER)
Admission: RE | Admit: 2023-03-26 | Discharge: 2023-03-26 | Disposition: A | Discharge status: Home / Self Care | Payer: 59 | Source: Ambulatory Visit | Attending: Internal Medicine | Admitting: Internal Medicine

## 2023-03-26 DIAGNOSIS — J432 Centrilobular emphysema: Secondary | ICD-10-CM | POA: Diagnosis not present

## 2023-03-26 DIAGNOSIS — R918 Other nonspecific abnormal finding of lung field: Secondary | ICD-10-CM | POA: Diagnosis not present

## 2023-03-26 DIAGNOSIS — R911 Solitary pulmonary nodule: Secondary | ICD-10-CM | POA: Diagnosis not present

## 2023-03-26 DIAGNOSIS — S2241XD Multiple fractures of ribs, right side, subsequent encounter for fracture with routine healing: Secondary | ICD-10-CM | POA: Diagnosis not present

## 2023-04-02 ENCOUNTER — Ambulatory Visit: Payer: 59 | Admitting: Internal Medicine

## 2023-04-02 ENCOUNTER — Encounter: Payer: Self-pay | Admitting: Internal Medicine

## 2023-04-02 VITALS — BP 102/58 | HR 92 | Temp 97.8°F | Ht 61.0 in | Wt 120.4 lb

## 2023-04-02 DIAGNOSIS — J438 Other emphysema: Secondary | ICD-10-CM

## 2023-04-02 DIAGNOSIS — Y93E6 Activity, residential relocation: Secondary | ICD-10-CM

## 2023-04-02 DIAGNOSIS — R911 Solitary pulmonary nodule: Secondary | ICD-10-CM | POA: Diagnosis not present

## 2023-04-02 DIAGNOSIS — Z23 Encounter for immunization: Secondary | ICD-10-CM | POA: Diagnosis not present

## 2023-04-02 DIAGNOSIS — Z87891 Personal history of nicotine dependence: Secondary | ICD-10-CM

## 2023-04-02 NOTE — Patient Instructions (Addendum)
Pulmonary emphysema (HCC) with possible emerging exacerbation  - stable  plan - Continue anoro daily scheduled and do nebulizer as needed  - compliance important - high dose flu shot 04/02/2023      Pulmonary nodules - small left upper lobe and right lower lobe  48 pack smoker  CT scan sept 2023 and Oct 2024 reported as stable and without cancer or fibrosis or pneumonia   Plan - CT chest without contrast OCt 2025  REsidential Relocation  Plan  -= ok to relocate to [facility] , [place] to live with daughter  Followup - 6 monthswith APP
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
## 2023-04-03 DIAGNOSIS — M4722 Other spondylosis with radiculopathy, cervical region: Secondary | ICD-10-CM | POA: Diagnosis not present

## 2023-04-03 DIAGNOSIS — Z79891 Long term (current) use of opiate analgesic: Secondary | ICD-10-CM | POA: Diagnosis not present

## 2023-04-03 DIAGNOSIS — M4726 Other spondylosis with radiculopathy, lumbar region: Secondary | ICD-10-CM | POA: Diagnosis not present

## 2023-04-03 DIAGNOSIS — G894 Chronic pain syndrome: Secondary | ICD-10-CM | POA: Diagnosis not present

## 2023-04-22 ENCOUNTER — Other Ambulatory Visit: Payer: Self-pay | Admitting: Cardiology

## 2023-04-22 ENCOUNTER — Other Ambulatory Visit: Payer: Self-pay | Admitting: Internal Medicine
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
## 2023-05-08 ENCOUNTER — Ambulatory Visit: Payer: 59 | Attending: Cardiology | Admitting: Cardiovascular Disease

## 2023-05-08 ENCOUNTER — Ambulatory Visit: Payer: 59 | Admitting: Cardiology

## 2023-05-08 ENCOUNTER — Encounter: Payer: Self-pay | Admitting: Cardiovascular Disease

## 2023-05-08 VITALS — BP 100/58 | HR 69 | Ht 61.0 in | Wt 122.0 lb

## 2023-05-08 DIAGNOSIS — E785 Hyperlipidemia, unspecified: Secondary | ICD-10-CM

## 2023-05-08 DIAGNOSIS — Z79899 Other long term (current) drug therapy: Secondary | ICD-10-CM

## 2023-05-08 DIAGNOSIS — I2510 Atherosclerotic heart disease of native coronary artery without angina pectoris: Secondary | ICD-10-CM | POA: Diagnosis not present

## 2023-05-08 DIAGNOSIS — I10 Essential (primary) hypertension: Secondary | ICD-10-CM

## 2023-05-08 DIAGNOSIS — I700 Atherosclerosis of aorta: Secondary | ICD-10-CM

## 2023-05-08 NOTE — Patient Instructions (Addendum)
Medication Instructions:  No changes  If you need a refill on your cardiac medications before your next appointment, please call your pharmacy.   Lab work: Your provider would like for you to have following labs drawn today Lipid and BMP.    Testing/Procedures: No new testing needed  Follow-Up: At [facility], you and your health needs are our priority.  As part of our continuing mission to provide you with exceptional heart care, we have created designated Provider Care Teams.  These Care Teams include your primary Cardiologist (physician) and Advanced Practice Providers (APPs -  Physician Assistants and Nurse Practitioners) who all work together to provide you with the care you need, when you need it.  You will need a follow up appointment in 12 months  Providers on your designated Care Team:   Nicolasa Ducking, NP Eula Listen, PA-C Cadence Fransico Michael, [place]  COVID-19 Vaccine Information can be found at: [email or web address] For questions related to vaccine distribution or appointments, please email [email or web address] or call [phone].

## 2023-05-09 LAB — LIPID PANEL
Chol/HDL Ratio: 2.5 {ratio} (ref 0.0–4.4)
Cholesterol, Total: 128 mg/dL (ref 100–199)
HDL: 52 mg/dL (ref >39)
LDL Chol Calc (NIH): 56 mg/dL (ref 0–99)
Triglycerides: 107 mg/dL (ref 0–149)
VLDL Cholesterol Cal: 20 mg/dL (ref 5–40)

## 2023-05-09 LAB — BASIC METABOLIC PANEL
BUN/Creatinine Ratio: 16 (ref 12–28)
BUN: 20 mg/dL (ref 8–27)
CO2: 25 mmol/L (ref 20–29)
Calcium: 8.9 mg/dL (ref 8.7–10.3)
Chloride: 99 mmol/L (ref 96–106)
Creatinine, Ser: 1.29 mg/dL — ABNORMAL HIGH (ref 0.57–1.00)
Glucose: 81 mg/dL (ref 70–99)
Potassium: 4.2 mmol/L (ref 3.5–5.2)
Sodium: 140 mmol/L (ref 134–144)
eGFR: 42 mL/min/{1.73_m2} — ABNORMAL LOW (ref >59)

## 2023-05-19 ENCOUNTER — Other Ambulatory Visit: Payer: Self-pay | Admitting: Cardiology

## 2023-05-19 ENCOUNTER — Other Ambulatory Visit: Payer: Self-pay | Admitting: Internal Medicine

## 2023-06-02 ENCOUNTER — Other Ambulatory Visit: Payer: Self-pay | Admitting: Cardiology

## 2023-06-09 DIAGNOSIS — G894 Chronic pain syndrome: Secondary | ICD-10-CM | POA: Diagnosis not present

## 2023-06-09 DIAGNOSIS — M4722 Other spondylosis with radiculopathy, cervical region: Secondary | ICD-10-CM | POA: Diagnosis not present

## 2023-06-09 DIAGNOSIS — M4726 Other spondylosis with radiculopathy, lumbar region: Secondary | ICD-10-CM | POA: Diagnosis not present

## 2023-06-09 DIAGNOSIS — Z79891 Long term (current) use of opiate analgesic: Secondary | ICD-10-CM | POA: Diagnosis not present

## 2023-07-02 ENCOUNTER — Ambulatory Visit: Payer: 59 | Admitting: Psychiatry

## 2023-07-09 DIAGNOSIS — G894 Chronic pain syndrome: Secondary | ICD-10-CM | POA: Diagnosis not present

## 2023-07-09 DIAGNOSIS — M4722 Other spondylosis with radiculopathy, cervical region: Secondary | ICD-10-CM | POA: Diagnosis not present

## 2023-07-09 DIAGNOSIS — Z79891 Long term (current) use of opiate analgesic: Secondary | ICD-10-CM | POA: Diagnosis not present

## 2023-07-09 DIAGNOSIS — M4726 Other spondylosis with radiculopathy, lumbar region: Secondary | ICD-10-CM | POA: Diagnosis not present

## 2023-07-12 NOTE — Progress Notes (Deleted)
 Psychiatric Initial Adult Assessment   Patient Identification: [name] MRN:  [id number] Date of Evaluation:  07/12/2023 Referral Source: *** Chief Complaint:  No chief complaint on file.  Visit Diagnosis: No diagnosis found.  History of Present Illness:   [name] is a 78 y.o. year old female with a history of depression, multiple sclerosis, CAD, hypertension, hyperlipidemia, COPD, who is referred for depression.      On provigil, gabapentin 100 mg daily as needed, 300 mg - tried Adderall, amantadine Doxepin 25 mg at night  Clonazepam 1 mg daily, 2 mg at night for spasm    Associated Signs/Symptoms: Depression Symptoms:  {DEPRESSION SYMPTOMS:20000} (Hypo) Manic Symptoms:  {BHH MANIC SYMPTOMS:22872} Anxiety Symptoms:  {BHH ANXIETY SYMPTOMS:22873} Psychotic Symptoms:  {BHH PSYCHOTIC SYMPTOMS:22874} PTSD Symptoms: {BHH PTSD SYMPTOMS:22875}  Past Psychiatric History:  Outpatient:  Psychiatry admission:  Previous suicide attempt:  Past trials of medication:  History of violence:  History of head injury:   Previous Psychotropic Medications: {YES/NO:21197}  Substance Abuse History in the last 12 months:  {yes no:314532}  Consequences of Substance Abuse: {BHH CONSEQUENCES OF SUBSTANCE ABUSE:22880}  Past Medical History:  Past Medical History:  Diagnosis Date   Anxiety    Aortic atherosclerosis (HCC) 09/29/2017   High Res CT in 9/18: aortic atherosclerosis; coronary artery calcification   Cervical disc disease    BULGING   Chronic combined systolic and diastolic CHF (congestive heart failure) (HCC) 09/29/2017   Echo 7/18: Moderate concentric LVH, grade 1 diastolic dysfunction, abnormal septal wall motion due to LBBB, moderate diffuse HK, EF 35-40, atrial septal aneurysm with possible PFO, mild TR // Echo 5/19: EF 50-55, normal wall motion, grade 1 diastolic dysfunction, trivial TR   Complication of anesthesia    SLOW TO AWAKEN AFTER LAST COLONSCOPY    Coronary artery disease 05/09/2015   LHC 10/12: EF 55, OM1 80-90 - difficult to engage >> treated medically // Nuc 8/18: EF 40, no ischemia   Depression    DJD (degenerative joint disease)    OA   Dysrhythmia    Not clear where this diagnosis came from   Emphysema of lung (HCC)    Dr. Marchelle Gearing   Fibromyalgia    H/O: liver disease 79 YRS AGO   tranamanitis due to previous interferon - LFTs improved off of   History of MI (myocardial infarction) FEW YRS AGO   History of TIA (transient ischemic attack)    Hx of colonic polyps    Hyperlipidemia    intol to statin - myalgias // ESPERION trial - patient stopped   Multiple sclerosis (HCC)    Narcotic dependence (HCC)    hx of benzodiazepine and narcotic   Osteoporosis    Positive H. pylori test    Urinary incontinence     Past Surgical History:  Procedure Laterality Date   BACK SURGERY  11-2009   LOWER BACK   BOTOX INJECTION N/A 03/21/2016   Procedure: BOTOX INJECTION;  Surgeon: Charlott Rakes, MD;  Location: [facility];  Service: Endoscopy;  Laterality: N/A;   CARDIAC CATHETERIZATION     UNSUCCESSFUL STENT PLACEMENT   COLONSCOPY     ESOPHAGEAL MANOMETRY N/A 10/23/2015   Procedure: ESOPHAGEAL MANOMETRY (EM);  Surgeon: Charlott Rakes, MD;  Location: [facility];  Service: Endoscopy;  Laterality: N/A;   ESOPHAGOGASTRODUODENOSCOPY (EGD) WITH PROPOFOL N/A 03/21/2016   Procedure: ESOPHAGOGASTRODUODENOSCOPY (EGD) WITH PROPOFOL;  Surgeon: Charlott Rakes, MD;  Location: [facility];  Service: Endoscopy;  Laterality: N/A;   RIGHT/LEFT HEART CATH  AND CORONARY ANGIOGRAPHY N/A 10/02/2017   Procedure: RIGHT/LEFT HEART CATH AND CORONARY ANGIOGRAPHY;  Surgeon: Yvonne Kendall, MD;  Location: [facility];  Service: Cardiovascular;  Laterality: N/A;   TUBAL LIGATION     VIDEO BRONCHOSCOPY Bilateral 05/18/2019   Procedure: VIDEO BRONCHOSCOPY WITHOUT FLUORO;  Surgeon: Kalman Shan, MD;  Location: [facility];  Service: Endoscopy;   Laterality: Bilateral;    Family Psychiatric History: ***  Family History:  Family History  Problem Relation Age of Onset   Heart attack Mother 44   Heart attack Father 45   Cancer Brother     Social History:   Social History   Socioeconomic History   Marital status: Divorced    Spouse name: Not on file   Number of children: 2   Years of education: 12th   Highest education level: 12th grade  Occupational History   Occupation: disability    Employer: RETIRED  Tobacco Use   Smoking status: Former    Current packs/day: 0.00    Average packs/day: 1 pack/day for 48.0 years (48.0 ttl pk-yrs)    Types: Cigarettes    Start date: 11/16/1961    Quit date: 11/16/2009    Years since quitting: 13.6   Smokeless tobacco: Never  Vaping Use   Vaping status: Never Used  Substance and Sexual Activity   Alcohol use: No   Drug use: No   Sexual activity: Not Currently  Other Topics Concern   Not on file  Social History Narrative   Patient lives at home alone. - [place], [place]   Caffeine Use: rarely   Retired - Used to be a Quarry manager; owned a [employer]   Married; 2 children; 1 granddaughter   Social Drivers of Corporate investment banker Strain: Low Risk  (12/14/2020)   Overall Financial Resource Strain (CARDIA)    Difficulty of Paying Living Expenses: Not hard at all  Food Insecurity: No Food Insecurity (12/14/2020)   Hunger Vital Sign    Worried About Running Out of Food in the Last Year: Never true    Ran Out of Food in the Last Year: Never true  Transportation Needs: No Transportation Needs (12/14/2020)   PRAPARE - Administrator, Civil Service (Medical): No    Lack of Transportation (Non-Medical): No  Physical Activity: Inactive (12/14/2020)   Exercise Vital Sign    Days of Exercise per Week: 0 days    Minutes of Exercise per Session: 0 min  Stress: Stress Concern Present (12/14/2020)   [employer] of Occupational Health - Occupational Stress  Questionnaire    Feeling of Stress : Very much  Social Connections: Moderately Isolated (12/14/2020)   Social Connection and Isolation Panel [NHANES]    Frequency of Communication with Friends and Family: Twice a week    Frequency of Social Gatherings with Friends and Family: Once a week    Attends Religious Services: 1 to 4 times per year    Active Member of [employer] or Organizations: No    Attends Banker Meetings: Never    Marital Status: Divorced    Additional Social History: ***  Allergies:   Allergies  Allergen Reactions   Contrast Media [Iodinated Contrast Media] Rash and Other (See Comments)    Severe rash   Crestor [Rosuvastatin] Hives and Other (See Comments)    welps   Sulfonamide Derivatives Hives    As a child   Actonel [Risedronate]     Other reaction(s): chest pain  Amoxicillin Hives and Other (See Comments)    Has patient had a PCN reaction causing immediate rash, facial/tongue/throat swelling, SOB or lightheadedness with hypotension: Yes  Has patient had a PCN reaction causing severe rash involving mucus membranes or skin necrosis: No  Has patient had a PCN reaction that required hospitalization Yes  Has patient had a PCN reaction occurring within the last 10 years: No  If all of the above answers are "NO", then may proceed with Cephalosporin use.  Has patient had a PCN reaction causing immediate rash, facial/tongue/throat swelling, SOB or lightheadedness with hypotension: Yes, Has patient had a PCN reaction causing severe rash involving mucus membranes or skin necrosis: No, Has patient had a PCN reaction that required hospitalization Yes, Has patient had a PCN reaction occurring within the last 10 years: No, If all of the above answers are "NO", then may proceed with Cephalosporin use.   Doxycycline Other (See Comments)   Parathyroid Hormone (Recomb)     Other reaction(s): hair loss or weakness   Sulfamethoxazole Other (See Comments)    Metabolic  Disorder Labs: Lab Results  Component Value Date   HGBA1C 5.9 (H) 05/09/2015   MPG 123 05/09/2015   No results found for: "PROLACTIN" Lab Results  Component Value Date   CHOL 128 05/08/2023   TRIG 107 05/08/2023   HDL 52 05/08/2023   CHOLHDL 2.5 05/08/2023   VLDL 21 05/09/2015   LDLCALC 56 05/08/2023   LDLCALC 127 (H) 05/09/2015   No results found for: "TSH"  Therapeutic Level Labs: No results found for: "LITHIUM" No results found for: "CBMZ" No results found for: "VALPROATE"  Current Medications: Current Outpatient Medications  Medication Sig Dispense Refill   albuterol (VENTOLIN HFA) 108 (90 Base) MCG/ACT inhaler INHALE TWO PUFFS INTO THE LUNGS EVERY SIX HOURS AS NEEDED FOR WHEEZING OR SHORTNESS OF BREATH 8.5 g 3   Ascorbic Acid (VITAMIN C) 500 MG CAPS Take 1 capsule by mouth every evening.     aspirin EC 81 MG tablet Take 81 mg by mouth daily.     CALCIUM PO Take 1 tablet by mouth every evening.     Cholecalciferol (VITAMIN D3) 125 MCG (5000 UT) CAPS Take 5,000 Units by mouth every evening.     clonazePAM (KLONOPIN) 1 MG tablet Take 1 mg by mouth 3 (three) times daily as needed for anxiety.      COENZYME Q10 PO Take 1 capsule by mouth every evening.     doxepin (SINEQUAN) 25 MG capsule Take 1 capsule (25 mg total) by mouth at bedtime. 30 capsule 5   fluticasone (FLONASE) 50 MCG/ACT nasal spray Place 2 sprays into both nostrils daily as needed for rhinitis or allergies.     gabapentin (NEURONTIN) 100 MG capsule Take 1 capsule (100 mg total) by mouth daily as needed (Pain). 30 capsule 5   gabapentin (NEURONTIN) 300 MG capsule Take 1 capsule (300 mg total) by mouth daily. 30 capsule 5   guaiFENesin (MUCINEX) 600 MG 12 hr tablet Take 600 mg by mouth 2 (two) times daily as needed for cough or to loosen phlegm.      Homeopathic Products (SIMILASAN DRY EYE RELIEF OP) Place 1 drop into both eyes 2 (two) times daily.     ipratropium (ATROVENT) 0.02 % nebulizer solution USE TWO AND A  HALF (2 & 1/2) MLS BY NEBULIZER FOUR TIMES DAILY 300 mL 5   isosorbide mononitrate (IMDUR) 120 MG 24 hr tablet TAKE ONE TABLET (120 MG TOTAL) BY  MOUTH IN THE MORNING AND AT BEDTIME. 180 tablet 2   loratadine (CLARITIN) 10 MG tablet Take 10 mg by mouth daily.     losartan (COZAAR) 25 MG tablet TAKE ONE TABLET BY MOUTH TWICE A DAY 180 tablet 1   lovastatin (MEVACOR) 40 MG tablet TAKE ONE TABLET BY MOUTH EVERY NIGHT AT BEDTIME 90 tablet 1   metoprolol succinate (TOPROL-XL) 25 MG 24 hr tablet TAKE ONE HALF (1/2) TABLET BY MOUTH DAILY 45 tablet 3   modafinil (PROVIGIL) 200 MG tablet Take 1 tablet (200 mg total) by mouth 2 (two) times daily. 60 tablet 3   mometasone (NASONEX) 50 MCG/ACT nasal spray SPRAY 2 SPRAYS INTO THE NOSE DAILY 17 g 5   Multiple Vitamin (MULTIVITAMIN WITH MINERALS) TABS tablet Take 1 tablet by mouth every evening.     nitroGLYCERIN (NITROSTAT) 0.4 MG SL tablet DISSOLVE ONE TABLET UNDER TONGUE AS NEEDED FOR CHEST PAIN. MAY REPEAT FIVE MINUTES APART THREE TIMES IF NEEDED 25 tablet 3   omeprazole (PRILOSEC) 20 MG capsule TAKE 1 CAPSULE BY MOUTH ONCE DAILY 30 capsule 5   oxyCODONE-acetaminophen (PERCOCET) 5-325 MG per tablet Take 1 tablet by mouth every 4 (four) hours as needed for moderate pain or severe pain.     potassium chloride SA (KLOR-CON M20) 20 MEQ tablet Take 40 mg ( 2 tablets) daily for 4 days 8 tablet 0   predniSONE (DELTASONE) 10 MG tablet 4 x 1 day, 3 x 1 day, 2 x 1 day, 1 x 1 day, 1/2 x 1 day then stop 11 tablet 0   PRISTIQ 50 MG 24 hr tablet Take 50 mg by mouth every evening.      torsemide (DEMADEX) 20 MG tablet TAKE ONE TABLET BY MOUTH AS NEEDED FOR SWELLING AND WEIGHT GAIN 90 tablet 0   umeclidinium-vilanterol (ANORO ELLIPTA) 62.5-25 MCG/ACT AEPB INAHLE ONE PUFF INTO THE LUNGS DAILY 60 each 6   vitamin B-12 (CYANOCOBALAMIN) 1000 MCG tablet Take 1,500 mcg by mouth daily.     No current facility-administered medications for this visit.    Musculoskeletal: Strength  & Muscle Tone: within normal limits Gait & Station: normal Patient leans: N/A  Psychiatric Specialty Exam: Review of Systems  There were no vitals taken for this visit.There is no height or weight on file to calculate BMI.  General Appearance: {Appearance:22683}  Eye Contact:  {BHH EYE CONTACT:22684}  Speech:  Clear and Coherent  Volume:  Normal  Mood:  {BHH MOOD:22306}  Affect:  {Affect (PAA):22687}  Thought Process:  Coherent  Orientation:  Full (Time, Place, and Person)  Thought Content:  Logical  Suicidal Thoughts:  {ST/HT (PAA):22692}  Homicidal Thoughts:  {ST/HT (PAA):22692}  Memory:  Immediate;   Good  Judgement:  {Judgement (PAA):22694}  Insight:  {Insight (PAA):22695}  Psychomotor Activity:  Normal  Concentration:  Concentration: Good and Attention Span: Good  Recall:  Good  Fund of Knowledge:Good  Language: Good  Akathisia:  No  Handed:  Right  AIMS (if indicated):  not done  Assets:  Communication Skills Desire for Improvement  ADL's:  Intact  Cognition: WNL  Sleep:  {BHH GOOD/FAIR/POOR:22877}   Screenings: Mini-Mental    Flowsheet Row Office Visit from 02/10/2017 in [facility] Neurology  Total Score (max 30 points ) 28      PHQ2-9    Flowsheet Row Patient Outreach Telephone from 12/14/2020 in [facility] Care Coordination  PHQ-2 Total Score 1      Flowsheet Row ED from 03/22/2023  in [facility] Emergency Department at [facility] ED from 06/16/2022 in [facility] Emergency Department at [facility] ED from 03/02/2022 in [facility] Emergency Department at [facility]  C-SSRS RISK CATEGORY No Risk No Risk No Risk       Assessment and Plan:    Plan   The patient demonstrates the following risk factors for suicide: Chronic risk factors for suicide include: {Chronic Risk Factors for ZOXWRUE:45409811}. Acute risk factors for suicide include: {Acute Risk Factors for BJYNWGN:56213086}. Protective  factors for this patient include: {Protective Factors for Suicide VHQI:69629528}. Considering these factors, the overall suicide risk at this point appears to be {Desc; low/moderate/high:110033}. Patient {ACTION; IS/IS UXL:24401027} appropriate for outpatient follow up.   Collaboration of Care: {BH OP Collaboration of Care:21014065}  Patient/Guardian was advised Release of Information must be obtained prior to any record release in order to collaborate their care with an outside provider. Patient/Guardian was advised if they have not already done so to contact the registration department to sign all necessary forms in order for [place] to release information regarding their care.   Consent: Patient/Guardian gives verbal consent for treatment and assignment of benefits for services provided during this visit. Patient/Guardian expressed understanding and agreed to proceed.   Neysa Hotter, MD 2/8/20253:46 PM

## 2023-07-14 ENCOUNTER — Other Ambulatory Visit: Payer: Self-pay | Admitting: Neurology

## 2023-07-14 DIAGNOSIS — G35 Multiple sclerosis: Secondary | ICD-10-CM

## 2023-07-15 ENCOUNTER — Ambulatory Visit: Payer: 59 | Admitting: Psychiatry

## 2023-07-15 DIAGNOSIS — G35 Multiple sclerosis: Secondary | ICD-10-CM | POA: Diagnosis not present

## 2023-07-15 DIAGNOSIS — I5042 Chronic combined systolic (congestive) and diastolic (congestive) heart failure: Secondary | ICD-10-CM | POA: Diagnosis not present

## 2023-07-15 DIAGNOSIS — J449 Chronic obstructive pulmonary disease, unspecified: Secondary | ICD-10-CM | POA: Diagnosis not present

## 2023-07-15 DIAGNOSIS — J438 Other emphysema: Secondary | ICD-10-CM | POA: Diagnosis not present

## 2023-07-17 ENCOUNTER — Other Ambulatory Visit: Payer: Self-pay | Admitting: Internal Medicine

## 2023-07-17 ENCOUNTER — Other Ambulatory Visit: Payer: Self-pay | Admitting: Cardiology

## 2023-07-17 NOTE — Telephone Encounter (Signed)
Provider is now Dr.Gollan, transfer to [employer]

## 2023-07-25 ENCOUNTER — Telehealth: Payer: Self-pay | Admitting: Internal Medicine

## 2023-07-25 NOTE — Telephone Encounter (Signed)
I called and spoke with the pt  She c/o increased SOB off and on past 3 wks  She says sometimes she "feels like burning in my chest" She denies any chest pain  She states that the burning sensation "feels like it's my lungs"  She denies any fevers, cough, wheezing, edema, or other symptoms  I made her the next available appt and advised to seek emergent care should her symptoms persist or worsen

## 2023-07-25 NOTE — Telephone Encounter (Signed)
Patient got the answering service, states she is having shortness of breath, gets worse when she takes nitroglycerin. Please call patient back at [phone]

## 2023-07-30 ENCOUNTER — Telehealth: Payer: Self-pay | Admitting: Cardiovascular Disease

## 2023-07-30 NOTE — Telephone Encounter (Signed)
 Called and spoke with patient. Patient requesting to be seen. Patient states that about 3 - 4 weeks ago she had a sharp pain in her chest and took nitroglycerin. Patient states that after taking the nitroglycerin she developed burning in the right side of her chest.  Patient reports that the burning has improved some. Patient reports that she has had some shortness of breath but none currently. Patient made aware of ED precaution should any new symptoms develop or worsen. Patient verbalizes understanding.  Patient scheduled to be seen in clinic 08/12/23.

## 2023-07-30 NOTE — Telephone Encounter (Signed)
 Pt called in stating her chest has been burning for about 3-4 days. She states she has SOB (not currently). Please advise.

## 2023-07-31 ENCOUNTER — Ambulatory Visit: Payer: 59 | Admitting: Internal Medicine

## 2023-08-05 DIAGNOSIS — M4726 Other spondylosis with radiculopathy, lumbar region: Secondary | ICD-10-CM | POA: Diagnosis not present

## 2023-08-05 DIAGNOSIS — Z79891 Long term (current) use of opiate analgesic: Secondary | ICD-10-CM | POA: Diagnosis not present

## 2023-08-05 DIAGNOSIS — M4722 Other spondylosis with radiculopathy, cervical region: Secondary | ICD-10-CM | POA: Diagnosis not present

## 2023-08-05 DIAGNOSIS — G894 Chronic pain syndrome: Secondary | ICD-10-CM | POA: Diagnosis not present

## 2023-08-08 ENCOUNTER — Ambulatory Visit: Payer: 59 | Admitting: Internal Medicine

## 2023-08-09 ENCOUNTER — Other Ambulatory Visit: Payer: Self-pay | Admitting: Cardiology

## 2023-08-09 ENCOUNTER — Other Ambulatory Visit: Payer: Self-pay | Admitting: Neurology
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
## 2023-08-11 NOTE — Telephone Encounter (Signed)
[place] pt 

## 2023-08-12 ENCOUNTER — Ambulatory Visit: Payer: 59 | Attending: Cardiovascular Disease | Admitting: Cardiovascular Disease

## 2023-08-12 ENCOUNTER — Encounter: Payer: Self-pay | Admitting: Cardiovascular Disease

## 2023-08-12 VITALS — BP 120/60 | HR 100 | Ht 61.0 in | Wt 122.4 lb

## 2023-08-12 DIAGNOSIS — E785 Hyperlipidemia, unspecified: Secondary | ICD-10-CM | POA: Diagnosis not present

## 2023-08-12 DIAGNOSIS — I700 Atherosclerosis of aorta: Secondary | ICD-10-CM

## 2023-08-12 DIAGNOSIS — I2510 Atherosclerotic heart disease of native coronary artery without angina pectoris: Secondary | ICD-10-CM | POA: Diagnosis not present

## 2023-08-12 DIAGNOSIS — G894 Chronic pain syndrome: Secondary | ICD-10-CM | POA: Diagnosis not present

## 2023-08-12 DIAGNOSIS — G459 Transient cerebral ischemic attack, unspecified: Secondary | ICD-10-CM | POA: Diagnosis not present

## 2023-08-12 DIAGNOSIS — I10 Essential (primary) hypertension: Secondary | ICD-10-CM

## 2023-08-12 MED ORDER — METOPROLOL SUCCINATE ER 25 MG PO TB24: 25.0000 mg | ORAL_TABLET | Freq: Every day | ORAL | 3 refills | Status: AC

## 2023-08-12 NOTE — Patient Instructions (Signed)
 Medication Instructions:  Please increase the metoprolol succinate up to 25 mg daily  (take a whole)  If you need a refill on your cardiac medications before your next appointment, please call your pharmacy.   Lab work: No new labs needed  Testing/Procedures: No new testing needed  Follow-Up: At [facility], you and your health needs are our priority.  As part of our continuing mission to provide you with exceptional heart care, we have created designated Provider Care Teams.  These Care Teams include your primary Cardiologist (physician) and Advanced Practice Providers (APPs -  Physician Assistants and Nurse Practitioners) who all work together to provide you with the care you need, when you need it.  You will need a follow up appointment in 12 months  Providers on your designated Care Team:   Nicolasa Ducking, NP Eula Listen, PA-C Cadence Fransico Michael, [place]  COVID-19 Vaccine Information can be found at: [email or web address] For questions related to vaccine distribution or appointments, please email [email or web address] or call [phone].

## 2023-08-13 NOTE — Telephone Encounter (Signed)
 Patient was seen in clinic 08/12/23 all questions and concerns addressed at that time.

## 2023-09-04 ENCOUNTER — Other Ambulatory Visit: Payer: Self-pay | Admitting: Neurology

## 2023-09-08 DIAGNOSIS — Z79891 Long term (current) use of opiate analgesic: Secondary | ICD-10-CM | POA: Diagnosis not present

## 2023-09-08 DIAGNOSIS — M4726 Other spondylosis with radiculopathy, lumbar region: Secondary | ICD-10-CM | POA: Diagnosis not present

## 2023-09-08 DIAGNOSIS — G894 Chronic pain syndrome: Secondary | ICD-10-CM | POA: Diagnosis not present

## 2023-09-08 DIAGNOSIS — M4722 Other spondylosis with radiculopathy, cervical region: Secondary | ICD-10-CM | POA: Diagnosis not present

## 2023-10-01 ENCOUNTER — Other Ambulatory Visit: Payer: Self-pay | Admitting: Internal Medicine

## 2023-10-08 ENCOUNTER — Telehealth: Payer: Self-pay | Admitting: Neurology

## 2023-10-08 ENCOUNTER — Other Ambulatory Visit: Payer: Self-pay | Admitting: Neurology

## 2023-10-08 ENCOUNTER — Other Ambulatory Visit: Payer: Self-pay

## 2023-10-08 DIAGNOSIS — G35 Multiple sclerosis: Secondary | ICD-10-CM

## 2023-10-08 MED ORDER — MODAFINIL 200 MG PO TABS: 200.0000 mg | ORAL_TABLET | Freq: Two times a day (BID) | ORAL | 3 refills | Status: DC

## 2023-10-08 NOTE — Telephone Encounter (Signed)
 done

## 2023-10-08 NOTE — Telephone Encounter (Signed)
 Patient needs a refill on the modafinil  [place] pharmacy she is out of medication refills

## 2023-10-09 DIAGNOSIS — G894 Chronic pain syndrome: Secondary | ICD-10-CM | POA: Diagnosis not present

## 2023-10-09 DIAGNOSIS — M4726 Other spondylosis with radiculopathy, lumbar region: Secondary | ICD-10-CM | POA: Diagnosis not present

## 2023-10-09 DIAGNOSIS — Z79891 Long term (current) use of opiate analgesic: Secondary | ICD-10-CM | POA: Diagnosis not present

## 2023-10-09 DIAGNOSIS — M4722 Other spondylosis with radiculopathy, cervical region: Secondary | ICD-10-CM | POA: Diagnosis not present

## 2023-10-15 ENCOUNTER — Ambulatory Visit: Payer: Self-pay | Admitting: Internal Medicine

## 2023-10-15 NOTE — Telephone Encounter (Signed)
 Copied from CRM [id number]. Topic: Clinical - Red Word Triage >> Oct 15, 2023  9:07 AM Audrey [name] wrote: Red Word that prompted transfer to Nurse Triage: Trouble breathing/SOB.  TRIAGE SUMMARY NOTE: Nurse speaking with pt, pt speaking in phrases and difficult to understand, speech may be slurred. Pt reporting that she had an episode of severe chest pain few days ago that started on right side and moved to middle of chest, took nitroglycerin  and pt states it made her whole right lung hurt "so bad could hardly stand it." Pt reporting that since that episode her chest has been sore to touch and has [name] "burning feeling," confirms she's been feeling worsening SOB that keeps going "downhill," feeling "lot weaker" since chest pain episode, also confirms "confusion yes, concentration is just gone," also getting "cramps" under her lungs/ribs, chronic intermittent nose bleeds from left nostril, dry congested nose. Pt requesting Audrey [name] give "something to help me breathe" and open up nose. Advised pt go to hospital via ambulance, pt refusing hospital, feels she will not come out of hospital, reporting also that she may consider the hospital on Saturday "if I'm still living" but not today because her dog comes home today and leaves on Saturday, "most important thing I've got." Nurse empathized with pt. Pt reporting she took rescue inhaler over phone and feeling "tad" better. Pt reporting she "wish I could" use oxygen therapy. Advised again hospital for symptoms but advised that sending HP message to pulm office for call back to pt with further recommendations in meantime, advised call back if anything worsening or need anything. Pt verbalized understanding and appreciation.  E2C2 Pulmonary Triage - Initial Assessment Questions "Chief Complaint (e.g., cough, sob, wheezing, fever, chills, sweat or additional symptoms) *Go to specific symptom protocol after initial questions. Seems to keep getting worse Downhill Chest pain  on right side moved to middle of chest, took nitroglycerin , it made whole right lung hurt so bad could hardly stand it, not pain since that day, sore to touch and burning feeling Have heart failure, pt thinking related it to that Speaking in phrases, sometimes struggling to breathe Lot weaker in last few days since chest pain few days ago Confusion yes, feeling kind of out of it, just feel more confused than was before that happened to right side Difficult to understand, speech sounds slurred Right under my lungs, get cramps under ribs Concentration is just gone she said Refusing 911, have had them up here before, I know if I go in there I won't come out Dog coming home today, need to spend that time with him, he's the most important thing I've got, I'll live what  Inside just about all the time, nose gets really dry, nose has been broke twice, one side bleeds really bad left side Asking about something to open up nose Bad feelings about hospital about hospital deaths of mother and sister On Saturday if I'm still living I'll go to hospital, have to think about it, dog goes back on Saturday Asking for something to help her breathe in the meantime from Audrey [name] Feels worse today but thinks she may feel better Just used Audrey [name] , just [name] tad better with that  "How long have symptoms been present?" About [name] week  "Have you used your inhalers/maintenance medication?" Yes If yes, "What medications?" Audrey [name]   OXYGEN: "Do you wear supplemental oxygen?" No If yes, "How many liters are you supposed to use?" But wish I could  Reason for Disposition  Sounds like [name]  life-threatening emergency to the triager  Answer Assessment - Initial Assessment Questions 6. CARDIAC HISTORY: "Do you have any history of heart disease?" (e.g., heart attack, angina, bypass surgery, angioplasty)      significant 7. LUNG HISTORY: "Do you have any history of lung disease?"  (e.g., pulmonary embolus, asthma, emphysema)      significant  Protocols used: Breathing Difficulty-[name]-AH

## 2023-10-15 NOTE — Telephone Encounter (Signed)
 ATC pt x2- no answer with no option to leave vm.  No DPR on file for our office so unable to call EC.

## 2023-10-15 NOTE — Telephone Encounter (Signed)
 Tried calling the pt and there was no answer and her voicemail was full, will try back later.

## 2023-10-15 NOTE — Telephone Encounter (Signed)
 Go to ER

## 2023-10-20 ENCOUNTER — Ambulatory Visit: Payer: Self-pay

## 2023-10-20 ENCOUNTER — Other Ambulatory Visit: Payer: Self-pay

## 2023-10-20 ENCOUNTER — Emergency Department
Admission: EM | Admit: 2023-10-20 | Discharge: 2023-10-20 | Disposition: A | Discharge status: Home / Self Care | Attending: Emergency Medicine | Admitting: Emergency Medicine

## 2023-10-20 ENCOUNTER — Emergency Department

## 2023-10-20 DIAGNOSIS — I509 Heart failure, unspecified: Secondary | ICD-10-CM | POA: Insufficient documentation

## 2023-10-20 DIAGNOSIS — R058 Other specified cough: Secondary | ICD-10-CM | POA: Diagnosis not present

## 2023-10-20 DIAGNOSIS — R0789 Other chest pain: Secondary | ICD-10-CM | POA: Diagnosis not present

## 2023-10-20 DIAGNOSIS — R059 Cough, unspecified: Secondary | ICD-10-CM | POA: Diagnosis not present

## 2023-10-20 DIAGNOSIS — R079 Chest pain, unspecified: Secondary | ICD-10-CM | POA: Diagnosis not present

## 2023-10-20 DIAGNOSIS — R0602 Shortness of breath: Secondary | ICD-10-CM | POA: Diagnosis not present

## 2023-10-20 LAB — BASIC METABOLIC PANEL WITH GFR
Anion gap: 13 (ref 5–15)
BUN: 13 mg/dL (ref 8–23)
CO2: 24 mmol/L (ref 22–32)
Calcium: 9.3 mg/dL (ref 8.9–10.3)
Chloride: 95 mmol/L — ABNORMAL LOW (ref 98–111)
Creatinine, Ser: 0.96 mg/dL (ref 0.44–1.00)
GFR, Estimated: >60 mL/min (ref >60)
Glucose, Bld: 102 mg/dL — ABNORMAL HIGH (ref 70–99)
Potassium: 3.7 mmol/L (ref 3.5–5.1)
Sodium: 132 mmol/L — ABNORMAL LOW (ref 135–145)

## 2023-10-20 LAB — BRAIN NATRIURETIC PEPTIDE: B Natriuretic Peptide: 32.4 pg/mL (ref 0.0–100.0)

## 2023-10-20 LAB — CBC
HCT: 34.8 % — ABNORMAL LOW (ref 36.0–46.0)
Hemoglobin: 11.4 g/dL — ABNORMAL LOW (ref 12.0–15.0)
MCH: 28.9 pg (ref 26.0–34.0)
MCHC: 32.8 g/dL (ref 30.0–36.0)
MCV: 88.3 fL (ref 80.0–100.0)
Platelets: 295 10*3/uL (ref 150–400)
RBC: 3.94 MIL/uL (ref 3.87–5.11)
RDW: 13.2 % (ref 11.5–15.5)
WBC: 7.3 10*3/uL (ref 4.0–10.5)
nRBC: 0 % (ref 0.0–0.2)

## 2023-10-20 LAB — TROPONIN I (HIGH SENSITIVITY): Troponin I (High Sensitivity): 6 ng/L (ref <18)

## 2023-10-20 LAB — PROTIME-INR
INR: 0.9 (ref 0.8–1.2)
Prothrombin Time: 12.4 s (ref 11.4–15.2)

## 2023-10-20 MED ORDER — AZITHROMYCIN 250 MG PO TABS: ORAL_TABLET | ORAL | 0 refills | Status: AC

## 2023-10-20 MED ORDER — AZITHROMYCIN 500 MG PO TABS
500.0000 mg | ORAL_TABLET | Freq: Once | ORAL | Status: AC
  Administered 2023-10-20: 500 mg via ORAL
  Filled 2023-10-20: qty 1

## 2023-10-20 NOTE — ED Provider Triage Note (Signed)
 Emergency Medicine Provider Triage Evaluation Note  [name] , a 78 y.o. female  was evaluated in triage.  Pt complains of progressing SOB x 1 month worsening x 1 week. Hx of CHF.   Review of Systems  Positive:  Negative:   Physical Exam  BP 126/73 (BP Location: Left Arm)   Pulse (!) 113   Temp 97.9 F (36.6 C) (Oral)   Resp 18   Ht 5\' 1"  (1.549 m)   Wt 49.9 kg   SpO2 95%   BMI 20.78 kg/m  Gen:   Awake, no distress   Resp:  Normal effort LCTAB MSK:   Moves extremities without difficulty  Other:  RRR  Medical Decision Making  Medically screening exam initiated at 4:36 PM.  Appropriate orders placed.  DHRUVI CRENSHAW was informed that the remainder of the evaluation will be completed by another provider, this initial triage assessment does not replace that evaluation, and the importance of remaining in the ED until their evaluation is complete.    Phyllis Breeze, Ronna Herskowitz A, PA-C 10/20/23 1639

## 2023-10-20 NOTE — Telephone Encounter (Signed)
 Copied from CRM [id number]. Topic: Clinical - Red Word Triage >> Oct 20, 2023  1:43 PM Margarette Shawl wrote: Red Word that prompted transfer to Nurse Triage:   Symptoms since last week Cough, green phlegm present Shortness of breath   Patient calling in stating she is still experiencing symptoms that caused her to call last week. Patient very difficulty to understand and she is only speaking in short phrases. Patient states she called last week and I advised that the office tried to call her back to advise her to go to the ED. After informing her of this the patient verbalized understanding and ended the call.    Reason for Disposition  [1] MODERATE difficulty breathing (e.g., speaks in phrases, SOB even at rest, pulse 100-120) AND [2] NEW-onset or WORSE than normal  Protocols used: Breathing Difficulty-A-AH

## 2023-10-20 NOTE — ED Triage Notes (Addendum)
 Patient c/o SOB X1 week. C/o cough with productive green mucous   Also reports burning in left chest this AM.   Patient has constant jerking movements of body. Reports she has never been told what causes them but diagnosed with tremors. Reports movement is worse when nervous   History MS and CHF

## 2023-10-20 NOTE — Telephone Encounter (Signed)
 FYI

## 2023-10-20 NOTE — ED Provider Notes (Addendum)
 [facility] Provider Note   Event Date/Time   First MD Initiated Contact with Patient 10/20/23 1717     (approximate) History  Chest Pain  HPI [name] is a 78 y.o. female with a stated past medical history of heart failure and multiple sclerosis with constant tremors of the body who presents complaining of shortness of breath over the last week with associated productive cough of green sputum.  Patient states that she started with a runny nose over the last few weeks that has now developed into a productive cough and shortness of breath.  Patient denies any fevers, recent travel, or sick contacts ROS: Patient currently denies any vision changes, tinnitus, difficulty speaking, facial droop, sore throat, chest pain, abdominal pain, nausea/vomiting/diarrhea, dysuria, or weakness/numbness/paresthesias in any extremity   Physical Exam  Triage Vital Signs: ED Triage Vitals  Encounter Vitals Group     BP 10/20/23 1625 126/73     Systolic BP Percentile --      Diastolic BP Percentile --      Pulse Rate 10/20/23 1625 (!) 113     Resp 10/20/23 1625 18     Temp 10/20/23 1625 97.9 F (36.6 C)     Temp Source 10/20/23 1625 Oral     SpO2 10/20/23 1625 95 %     Weight 10/20/23 1626 110 lb (49.9 kg)     Height 10/20/23 1626 5\' 1"  (1.549 m)     Head Circumference --      Peak Flow --      Pain Score 10/20/23 1625 5     Pain Loc --      Pain Education --      Exclude from Growth Chart --    Most recent vital signs: Vitals:   10/20/23 1625 10/20/23 1800  BP: 126/73 (!) 151/100  Pulse: (!) 113 (!) 102  Resp: 18 20  Temp: 97.9 F (36.6 C)   SpO2: 95% 96%   General: Awake, oriented x4. CV:  Good peripheral perfusion.  Resp:  Normal effort.  CTAB Abd:  No distention.  Other:  Elderly well-developed, well-nourished Caucasian female resting comfortably in no acute distress ED Results / Procedures / Treatments  Labs (all labs ordered are listed, but only  abnormal results are displayed) Labs Reviewed  BASIC METABOLIC PANEL WITH GFR - Abnormal; Notable for the following components:      Result Value   Sodium 132 (*)    Chloride 95 (*)    Glucose, Bld 102 (*)    All other components within normal limits  CBC - Abnormal; Notable for the following components:   Hemoglobin 11.4 (*)    HCT 34.8 (*)    All other components within normal limits  PROTIME-INR  BRAIN NATRIURETIC PEPTIDE  TROPONIN I (HIGH SENSITIVITY)   EKG ED ECG REPORT I, Charleen Conn, the attending physician, personally viewed and interpreted this ECG. Date: 10/20/2023 EKG Time: 1627 Rate: 116 Rhythm: Tachycardic sinus rhythm QRS Axis: normal Intervals: LBBB ST/T Wave abnormalities: normal Narrative Interpretation: Tachycardic normal sinus rhythm with left bundle branch block.  No evidence of acute ischemia RADIOLOGY ED MD interpretation: 2 view chest x-ray interpreted by me shows no evidence of acute abnormalities including no pneumonia, pneumothorax, or widened mediastinum -Agree with radiology assessment Official radiology report(s): DG Chest 2 View Result Date: 10/20/2023 CLINICAL DATA:  Chest pain EXAM: CHEST - 2 VIEW COMPARISON:  None Available. FINDINGS: The heart size and mediastinal contours are within  normal limits. Both lungs are clear. Right convex thoracic curvature. IMPRESSION: No active cardiopulmonary disease. Electronically Signed   By: Reagan Camera M.D.   On: 10/20/2023 18:10   PROCEDURES: Critical Care performed: No Procedures MEDICATIONS ORDERED IN ED: Medications  azithromycin  (ZITHROMAX ) tablet 500 mg (500 mg Oral Given 10/20/23 1840)   IMPRESSION / MDM / ASSESSMENT AND PLAN / ED COURSE  I reviewed the triage vital signs and the nursing notes.                             The patient is on the cardiac monitor to evaluate for evidence of arrhythmia and/or significant heart rate changes. Patient's presentation is most consistent with acute  presentation with potential threat to life or bodily function. Otherwise healthy patient presenting with constellation of symptoms likely representing uncomplicated viral upper respiratory symptoms as characterized by mild bronchitis with productive cough  Unlikely PTA/RPA: no hot potato voice, no uvular deviation, Unlikely Esophageal rupture: No history of dysphagia Unlikely deep space infection/Ludwig's Low suspicion for CNS infection bacterial sinusitis, or pneumonia given exam and history.  Unlikely Strep or EBV as centor negative and with no pharyngeal exudate, posterior LAD, or splenomegaly. Rx: Z-Pak  No respiratory distress, otherwise relatively well appearing and nontoxic. Will discuss prompt follow up with PMD and strict return precautions.   FINAL CLINICAL IMPRESSION(S) / ED DIAGNOSES   Final diagnoses:  Productive cough  Chest pain, unspecified type   Rx / DC Orders   ED Discharge Orders          Ordered    azithromycin  (ZITHROMAX  Z-PAK) 250 MG tablet        10/20/23 1838           Note:  This document was prepared using Dragon voice recognition software and may include unintentional dictation errors.   Charleen Conn, MD 10/21/23 1607    Vaiden Adames K, MD 10/21/23 [phone]

## 2023-10-21 ENCOUNTER — Telehealth: Payer: Self-pay

## 2023-10-21 NOTE — Telephone Encounter (Signed)
 She needs to go to an urgent care or ER

## 2023-10-21 NOTE — Telephone Encounter (Signed)
 Copied from CRM [id number]. Topic: Clinical - Medication Question >> Oct 21, 2023  3:54 PM Juliana Ocean wrote: Reason for CRM: pt wants to know if you have any umeclidinium-vilanterol (ANORO ELLIPTA ) 62.5-25 MCG/ACT AEPB Samples?  Pt does not have enough to last until next Wed when she can get refills. Pt lives in [place], but goes to [place] to be seen.  Just checking w/ [facility] [facility] as well.  Thanks.  ATC pt X1. Mailbox is full so unable to lvm. We do not have samples of Anoro. Hopefully she was able to reach the [place] office to ask if they had any.

## 2023-10-21 NOTE — Telephone Encounter (Signed)
 Pt went to ED 10/20/23

## 2023-10-24 DIAGNOSIS — I5042 Chronic combined systolic (congestive) and diastolic (congestive) heart failure: Secondary | ICD-10-CM | POA: Diagnosis not present

## 2023-10-24 DIAGNOSIS — J449 Chronic obstructive pulmonary disease, unspecified: Secondary | ICD-10-CM | POA: Diagnosis not present

## 2023-10-24 DIAGNOSIS — R059 Cough, unspecified: Secondary | ICD-10-CM | POA: Diagnosis not present

## 2023-10-24 NOTE — Telephone Encounter (Signed)
 I called and spoke to pt. Pt states she had found the samples she already had and no longer needs newer samples. Pt was advised to contact our office if anything further was needed. Pt verbalized understanding. NFN

## 2023-10-29 ENCOUNTER — Other Ambulatory Visit: Payer: Self-pay | Admitting: Internal Medicine

## 2023-11-05 DIAGNOSIS — M4726 Other spondylosis with radiculopathy, lumbar region: Secondary | ICD-10-CM | POA: Diagnosis not present

## 2023-11-05 DIAGNOSIS — M4722 Other spondylosis with radiculopathy, cervical region: Secondary | ICD-10-CM | POA: Diagnosis not present

## 2023-11-05 DIAGNOSIS — G894 Chronic pain syndrome: Secondary | ICD-10-CM | POA: Diagnosis not present

## 2023-11-05 DIAGNOSIS — Z79891 Long term (current) use of opiate analgesic: Secondary | ICD-10-CM | POA: Diagnosis not present

## 2023-11-07 DIAGNOSIS — G894 Chronic pain syndrome: Secondary | ICD-10-CM | POA: Diagnosis not present

## 2023-11-07 DIAGNOSIS — Z79891 Long term (current) use of opiate analgesic: Secondary | ICD-10-CM | POA: Diagnosis not present

## 2023-11-20 ENCOUNTER — Other Ambulatory Visit: Payer: Self-pay | Admitting: Neurology

## 2023-12-03 DIAGNOSIS — M4726 Other spondylosis with radiculopathy, lumbar region: Secondary | ICD-10-CM | POA: Diagnosis not present

## 2023-12-03 DIAGNOSIS — Z79891 Long term (current) use of opiate analgesic: Secondary | ICD-10-CM | POA: Diagnosis not present

## 2023-12-03 DIAGNOSIS — M4722 Other spondylosis with radiculopathy, cervical region: Secondary | ICD-10-CM | POA: Diagnosis not present

## 2023-12-03 DIAGNOSIS — G894 Chronic pain syndrome: Secondary | ICD-10-CM | POA: Diagnosis not present

## 2023-12-09 ENCOUNTER — Other Ambulatory Visit: Payer: Self-pay

## 2023-12-09 ENCOUNTER — Emergency Department

## 2023-12-09 ENCOUNTER — Inpatient Hospital Stay
Admission: EM | Admit: 2023-12-09 | Discharge: 2023-12-10 | DRG: 312 | Disposition: A | Discharge status: Home / Self Care | Attending: Family Medicine | Admitting: Family Medicine

## 2023-12-09 DIAGNOSIS — R9082 White matter disease, unspecified: Secondary | ICD-10-CM | POA: Diagnosis not present

## 2023-12-09 DIAGNOSIS — I5042 Chronic combined systolic (congestive) and diastolic (congestive) heart failure: Secondary | ICD-10-CM | POA: Diagnosis not present

## 2023-12-09 DIAGNOSIS — Z7982 Long term (current) use of aspirin: Secondary | ICD-10-CM

## 2023-12-09 DIAGNOSIS — J449 Chronic obstructive pulmonary disease, unspecified: Secondary | ICD-10-CM | POA: Diagnosis present

## 2023-12-09 DIAGNOSIS — Z888 Allergy status to other drugs, medicaments and biological substances status: Secondary | ICD-10-CM

## 2023-12-09 DIAGNOSIS — F419 Anxiety disorder, unspecified: Secondary | ICD-10-CM | POA: Diagnosis present

## 2023-12-09 DIAGNOSIS — G894 Chronic pain syndrome: Secondary | ICD-10-CM | POA: Diagnosis present

## 2023-12-09 DIAGNOSIS — I959 Hypotension, unspecified: Secondary | ICD-10-CM | POA: Diagnosis not present

## 2023-12-09 DIAGNOSIS — N179 Acute kidney failure, unspecified: Secondary | ICD-10-CM | POA: Diagnosis present

## 2023-12-09 DIAGNOSIS — Z5329 Procedure and treatment not carried out because of patient's decision for other reasons: Secondary | ICD-10-CM | POA: Diagnosis present

## 2023-12-09 DIAGNOSIS — Z882 Allergy status to sulfonamides status: Secondary | ICD-10-CM | POA: Diagnosis not present

## 2023-12-09 DIAGNOSIS — F329 Major depressive disorder, single episode, unspecified: Secondary | ICD-10-CM | POA: Diagnosis present

## 2023-12-09 DIAGNOSIS — R079 Chest pain, unspecified: Secondary | ICD-10-CM | POA: Diagnosis not present

## 2023-12-09 DIAGNOSIS — E785 Hyperlipidemia, unspecified: Secondary | ICD-10-CM | POA: Diagnosis not present

## 2023-12-09 DIAGNOSIS — E782 Mixed hyperlipidemia: Secondary | ICD-10-CM | POA: Diagnosis not present

## 2023-12-09 DIAGNOSIS — R918 Other nonspecific abnormal finding of lung field: Secondary | ICD-10-CM | POA: Diagnosis not present

## 2023-12-09 DIAGNOSIS — I252 Old myocardial infarction: Secondary | ICD-10-CM

## 2023-12-09 DIAGNOSIS — D649 Anemia, unspecified: Secondary | ICD-10-CM | POA: Diagnosis present

## 2023-12-09 DIAGNOSIS — T463X5A Adverse effect of coronary vasodilators, initial encounter: Secondary | ICD-10-CM | POA: Diagnosis present

## 2023-12-09 DIAGNOSIS — I952 Hypotension due to drugs: Secondary | ICD-10-CM | POA: Diagnosis not present

## 2023-12-09 DIAGNOSIS — Z79899 Other long term (current) drug therapy: Secondary | ICD-10-CM

## 2023-12-09 DIAGNOSIS — I25119 Atherosclerotic heart disease of native coronary artery with unspecified angina pectoris: Secondary | ICD-10-CM | POA: Diagnosis not present

## 2023-12-09 DIAGNOSIS — Z91041 Radiographic dye allergy status: Secondary | ICD-10-CM

## 2023-12-09 DIAGNOSIS — Z8673 Personal history of transient ischemic attack (TIA), and cerebral infarction without residual deficits: Secondary | ICD-10-CM

## 2023-12-09 DIAGNOSIS — I2510 Atherosclerotic heart disease of native coronary artery without angina pectoris: Secondary | ICD-10-CM | POA: Diagnosis present

## 2023-12-09 DIAGNOSIS — Z880 Allergy status to penicillin: Secondary | ICD-10-CM

## 2023-12-09 DIAGNOSIS — R400 Somnolence: Secondary | ICD-10-CM | POA: Diagnosis not present

## 2023-12-09 DIAGNOSIS — R4182 Altered mental status, unspecified: Secondary | ICD-10-CM | POA: Diagnosis present

## 2023-12-09 DIAGNOSIS — I700 Atherosclerosis of aorta: Secondary | ICD-10-CM | POA: Diagnosis not present

## 2023-12-09 DIAGNOSIS — M797 Fibromyalgia: Secondary | ICD-10-CM | POA: Diagnosis present

## 2023-12-09 DIAGNOSIS — J439 Emphysema, unspecified: Secondary | ICD-10-CM | POA: Diagnosis present

## 2023-12-09 DIAGNOSIS — Z8249 Family history of ischemic heart disease and other diseases of the circulatory system: Secondary | ICD-10-CM

## 2023-12-09 DIAGNOSIS — I10 Essential (primary) hypertension: Secondary | ICD-10-CM | POA: Diagnosis present

## 2023-12-09 DIAGNOSIS — G35 Multiple sclerosis: Secondary | ICD-10-CM | POA: Diagnosis present

## 2023-12-09 DIAGNOSIS — R404 Transient alteration of awareness: Secondary | ICD-10-CM | POA: Diagnosis not present

## 2023-12-09 DIAGNOSIS — I110 Hypertensive heart disease with heart failure: Secondary | ICD-10-CM | POA: Diagnosis present

## 2023-12-09 DIAGNOSIS — Z87891 Personal history of nicotine dependence: Secondary | ICD-10-CM | POA: Diagnosis not present

## 2023-12-09 DIAGNOSIS — Z743 Need for continuous supervision: Secondary | ICD-10-CM | POA: Diagnosis not present

## 2023-12-09 DIAGNOSIS — J9601 Acute respiratory failure with hypoxia: Secondary | ICD-10-CM | POA: Diagnosis present

## 2023-12-09 DIAGNOSIS — I499 Cardiac arrhythmia, unspecified: Secondary | ICD-10-CM | POA: Diagnosis not present

## 2023-12-09 DIAGNOSIS — R6889 Other general symptoms and signs: Secondary | ICD-10-CM | POA: Diagnosis not present

## 2023-12-09 LAB — CBC WITH DIFFERENTIAL/PLATELET
Abs Immature Granulocytes: 0.02 K/uL (ref 0.00–0.07)
Basophils Absolute: 0 K/uL (ref 0.0–0.1)
Basophils Relative: 1 %
Eosinophils Absolute: 0.3 K/uL (ref 0.0–0.5)
Eosinophils Relative: 4 %
HCT: 30.3 % — ABNORMAL LOW (ref 36.0–46.0)
Hemoglobin: 9.7 g/dL — ABNORMAL LOW (ref 12.0–15.0)
Immature Granulocytes: 0 %
Lymphocytes Relative: 28 %
Lymphs Abs: 2.2 K/uL (ref 0.7–4.0)
MCH: 29.8 pg (ref 26.0–34.0)
MCHC: 32 g/dL (ref 30.0–36.0)
MCV: 93.2 fL (ref 80.0–100.0)
Monocytes Absolute: 0.7 K/uL (ref 0.1–1.0)
Monocytes Relative: 9 %
Neutro Abs: 4.7 K/uL (ref 1.7–7.7)
Neutrophils Relative %: 58 %
Platelets: 204 K/uL (ref 150–400)
RBC: 3.25 MIL/uL — ABNORMAL LOW (ref 3.87–5.11)
RDW: 13.2 % (ref 11.5–15.5)
WBC: 8 K/uL (ref 4.0–10.5)
nRBC: 0 % (ref 0.0–0.2)

## 2023-12-09 LAB — BLOOD GAS, VENOUS
Acid-Base Excess: 2.7 mmol/L — ABNORMAL HIGH (ref 0.0–2.0)
Bicarbonate: 28.9 mmol/L — ABNORMAL HIGH (ref 20.0–28.0)
O2 Saturation: 82.6 %
Patient temperature: 37
pCO2, Ven: 50 mmHg (ref 44–60)
pH, Ven: 7.37 (ref 7.25–7.43)
pO2, Ven: 50 mmHg — ABNORMAL HIGH (ref 32–45)

## 2023-12-09 LAB — COMPREHENSIVE METABOLIC PANEL WITH GFR
ALT: 14 U/L (ref 0–44)
AST: 27 U/L (ref 15–41)
Albumin: 3.6 g/dL (ref 3.5–5.0)
Alkaline Phosphatase: 89 U/L (ref 38–126)
Anion gap: 12 (ref 5–15)
BUN: 34 mg/dL — ABNORMAL HIGH (ref 8–23)
CO2: 24 mmol/L (ref 22–32)
Calcium: 8.1 mg/dL — ABNORMAL LOW (ref 8.9–10.3)
Chloride: 102 mmol/L (ref 98–111)
Creatinine, Ser: 1.62 mg/dL — ABNORMAL HIGH (ref 0.44–1.00)
GFR, Estimated: 32 mL/min — ABNORMAL LOW (ref >60)
Glucose, Bld: 78 mg/dL (ref 70–99)
Potassium: 3.7 mmol/L (ref 3.5–5.1)
Sodium: 138 mmol/L (ref 135–145)
Total Bilirubin: 0.4 mg/dL (ref 0.0–1.2)
Total Protein: 6.3 g/dL — ABNORMAL LOW (ref 6.5–8.1)

## 2023-12-09 LAB — TROPONIN I (HIGH SENSITIVITY): Troponin I (High Sensitivity): 8 ng/L (ref <18)

## 2023-12-09 LAB — ETHANOL: Alcohol, Ethyl (B): <15 mg/dL (ref <15)

## 2023-12-09 MED ORDER — NICOTINE 21 MG/24HR TD PT24: 21.0000 mg | MEDICATED_PATCH | Freq: Every day | TRANSDERMAL | Status: DC | PRN

## 2023-12-09 MED ORDER — SODIUM CHLORIDE 0.9 % IV BOLUS
1000.0000 mL | Freq: Once | INTRAVENOUS | Status: AC
  Administered 2023-12-09: 1000 mL via INTRAVENOUS

## 2023-12-09 MED ORDER — PRAVASTATIN SODIUM 20 MG PO TABS: 40.0000 mg | ORAL_TABLET | Freq: Every day | ORAL | Status: DC

## 2023-12-09 MED ORDER — ACETAMINOPHEN 325 MG PO TABS: 650.0000 mg | ORAL_TABLET | Freq: Four times a day (QID) | ORAL | Status: DC | PRN

## 2023-12-09 MED ORDER — HEPARIN SODIUM (PORCINE) 5000 UNIT/ML IJ SOLN
5000.0000 [IU] | Freq: Three times a day (TID) | INTRAMUSCULAR | Status: DC
  Administered 2023-12-10: 5000 [IU] via SUBCUTANEOUS
  Filled 2023-12-09: qty 1

## 2023-12-09 MED ORDER — LACTATED RINGERS IV BOLUS
1000.0000 mL | Freq: Once | INTRAVENOUS | Status: AC
  Administered 2023-12-10: 1000 mL via INTRAVENOUS

## 2023-12-09 MED ORDER — SODIUM CHLORIDE 0.9 % IV SOLN: INTRAVENOUS | Status: DC

## 2023-12-09 MED ORDER — IPRATROPIUM-ALBUTEROL 0.5-2.5 (3) MG/3ML IN SOLN: 3.0000 mL | Freq: Four times a day (QID) | RESPIRATORY_TRACT | Status: DC | PRN

## 2023-12-09 MED ORDER — ONDANSETRON HCL 4 MG/2ML IJ SOLN: 4.0000 mg | Freq: Four times a day (QID) | INTRAMUSCULAR | Status: DC | PRN

## 2023-12-09 MED ORDER — ONDANSETRON HCL 4 MG PO TABS: 4.0000 mg | ORAL_TABLET | Freq: Four times a day (QID) | ORAL | Status: DC | PRN

## 2023-12-09 MED ORDER — ASPIRIN 81 MG PO TBEC
81.0000 mg | DELAYED_RELEASE_TABLET | Freq: Every day | ORAL | Status: DC
  Administered 2023-12-10: 81 mg via ORAL
  Filled 2023-12-09: qty 1

## 2023-12-09 MED ORDER — VITAMIN C 500 MG PO TABS: 500.0000 mg | ORAL_TABLET | Freq: Every evening | ORAL | Status: DC

## 2023-12-09 MED ORDER — CLONAZEPAM 0.5 MG PO TABS: 1.0000 mg | ORAL_TABLET | Freq: Three times a day (TID) | ORAL | Status: DC | PRN

## 2023-12-09 MED ORDER — ACETAMINOPHEN 650 MG RE SUPP: 650.0000 mg | Freq: Four times a day (QID) | RECTAL | Status: DC | PRN

## 2023-12-09 MED ORDER — SENNOSIDES-DOCUSATE SODIUM 8.6-50 MG PO TABS: 1.0000 | ORAL_TABLET | Freq: Every evening | ORAL | Status: DC | PRN

## 2023-12-09 NOTE — Assessment & Plan Note (Signed)
 Query pre-renal Fluid hydration Recheck BMP in the AM

## 2023-12-09 NOTE — ED Notes (Signed)
 Pt back from CT

## 2023-12-09 NOTE — Assessment & Plan Note (Signed)
 Continue oxygen supplementation to maintain SpO2 greater than 92% Continuous pulse oximetry ordered on admission

## 2023-12-09 NOTE — Hospital Course (Signed)
 Ms. [name] is a 78 year old female with history of hypertension, CAD, COPD, chronic pain syndrome, anxiety, who presents emergency department for chief concerns of altered mentation.  Per ED documentation, patient was reported to be hypoxic with 88% on room air and was placed on nasal cannula.  Vitals in the ED showed T of 98.1, respiration rate of 12, heart rate 69, blood pressure initially 94/48 and improved to 97/55 with a MAP of 69.  Serum sodium is 138, potassium 3.7, chloride 102, bicarb 24, BUN of 34, serum creatinine 1.62, EGFR 32, nonfasting blood glucose 78, WBC 8, hemoglobin 9.7, platelets of 204.  HS troponin was 8.  UDS and UA have been ordered and pending collection at this time.  ED treatment: Sodium chloride  1 L bolus x 2.

## 2023-12-09 NOTE — H&P (Addendum)
 History and Physical   [name] FMW:[id number] DOB: [birth year] DOA: 12/09/2023  PCP: Sun, Vyvyan, MD  Patient coming from: From her car via EMS  I have personally briefly reviewed patient's old medical records in [facility] EMR.  Chief Concern: ams  HPI: [name] is a 78 year old female with history of hypertension, CAD, COPD, chronic pain syndrome, anxiety, who presents emergency department for chief concerns of altered mentation.  Per ED documentation, patient was reported to be hypoxic with 88% on room air and was placed on nasal cannula.  Vitals in the ED showed T of 98.1, respiration rate of 12, heart rate 69, blood pressure initially 94/48 and improved to 97/55 with a MAP of 69.  Serum sodium is 138, potassium 3.7, chloride 102, bicarb 24, BUN of 34, serum creatinine 1.62, EGFR 32, nonfasting blood glucose 78, WBC 8, hemoglobin 9.7, platelets of 204.  HS troponin was 8.  UDS and UA have been ordered and pending collection at this time.  ED treatment: Sodium chloride  1 L bolus x 2. --------------------------------- At bedside patient is lethargic and able to tell me her first and last name and she was able to mumble her age.  She actually yelled at me her last name first name.  Patient was unable to tell me how many nitroglycerin  tablets she ingested.  She reports she does not have any chest pain at this time.  ROS: unable to fully complete due to patient acuity of presentation  ED Course: Discussed with EDP, patient requiring hospitalization for chief concerns of hypotension  Assessment/Plan  Principal Problem:   Altered mental status Active Problems:   History of smoking 25-50 pack years   Hyperlipidemia   Coronary artery disease   COPD (chronic obstructive pulmonary disease) (HCC)   Chronic pain syndrome   Hypertension   Major depression, chronic   Acute respiratory failure with hypoxia (HCC)   Hypotension   AKI (acute kidney injury) (HCC)    Normocytic anemia   Assessment and Plan:  * Altered mental status Maintain n.p.o. except for sips of meds and ice chips due to aspiration risk Fall precaution, aspiration precaution  Normocytic anemia Range over the last year has been 9.6-11.5 Patient would benefit from outpatient workup  AKI (acute kidney injury) (HCC) Query pre-renal Fluid hydration Recheck BMP in the AM  Hypotension Suspect secondary to nitroglycerin  use Unclear how many nitroglycerin  dose that she gave herself Status post sodium chloride  2 L bolus per EP Goal MAP greater than 65 LR 1 L bolus ordered on admission Continuous sodium chloride  IVF at 150 mL/h, 1 day ordered Admit to PCU, inpatient  Acute respiratory failure with hypoxia (HCC) Continue oxygen supplementation to maintain SpO2 greater than 92% Continuous pulse oximetry ordered on admission  Hypertension Antihypertensive medication not resumed on admission due to borderline low normotension in the setting of recent nitroglycerin  use unclear number of doses A.m. team to resume home antihypertensive medications when benefits outweigh the  Chronic pain syndrome PDMP reviewed Home clonazepam  1 mg tablet 3 times daily as needed for anxiety, oxycodone -acetaminophen  5-325 mg p.o. not resumed admission due to low blood pressure  Coronary artery disease Pravastatin  40 mg daily, aspirin  81 mg daily resumed Home Imdur  120 mg daily, metoprolol  succinate 25 mg daily, losartan  25 mg daily were not resumed on admission, a.m. team to resume when benefits outweigh the risk  Hyperlipidemia Pravastatin  40 mg nightly resume for 12/10/2023  History of smoking 25-50 pack years As needed nicotine  patch  Chart reviewed.   DVT prophylaxis: Heparin  5000 units subcutaneous every 8 hours Code Status: Full code Diet: N.p.o. except for sips meds due to aspiration risk Family Communication: Attempted to call daughter, Levon May at [phone], no pick up.   Disposition Plan: Pending clinical; guarded prognosis Consults called: None at this time Admission status: PCU, inpatient  Past Medical History:  Diagnosis Date   Anxiety    Aortic atherosclerosis (HCC) 09/29/2017   High Res CT in 9/18: aortic atherosclerosis; coronary artery calcification   Cervical disc disease    BULGING   Chronic combined systolic and diastolic CHF (congestive heart failure) (HCC) 09/29/2017   Echo 7/18: Moderate concentric LVH, grade 1 diastolic dysfunction, abnormal septal wall motion due to LBBB, moderate diffuse HK, EF 35-40, atrial septal aneurysm with possible PFO, mild TR // Echo 5/19: EF 50-55, normal wall motion, grade 1 diastolic dysfunction, trivial TR   Complication of anesthesia    SLOW TO AWAKEN AFTER LAST COLONSCOPY   Coronary artery disease 05/09/2015   LHC 10/12: EF 55, OM1 80-90 - difficult to engage >> treated medically // Nuc 8/18: EF 40, no ischemia   Depression    DJD (degenerative joint disease)    OA   Dysrhythmia    Not clear where this diagnosis came from   Emphysema of lung (HCC)    Dr. Geronimo   Fibromyalgia    H/O: liver disease 78 YRS AGO   tranamanitis due to previous interferon - LFTs improved off of   History of MI (myocardial infarction) FEW YRS AGO   History of TIA (transient ischemic attack)    Hx of colonic polyps    Hyperlipidemia    intol to statin - myalgias // ESPERION trial - patient stopped   Multiple sclerosis (HCC)    Narcotic dependence (HCC)    hx of benzodiazepine and narcotic   Osteoporosis    Positive H. pylori test    Urinary incontinence    Past Surgical History:  Procedure Laterality Date   BACK SURGERY  11-2009   LOWER BACK   BOTOX  INJECTION N/A 03/21/2016   Procedure: BOTOX  INJECTION;  Surgeon: Jerrell Sol, MD;  Location: [facility] END[facility]COPY;  Service: Endoscopy;  Laterality: N/A;   CARDIAC CATHETERIZATION     UNSUCCESSFUL STENT PLACEMENT   COLONSCOPY     ESOPHAGEAL MANOMETRY N/A 10/23/2015    Procedure: ESOPHAGEAL MANOMETRY (EM);  Surgeon: Jerrell Sol, MD;  Location: [facility];  Service: Endoscopy;  Laterality: N/A;   ESOPHAGOGASTRODUODENOSCOPY (EGD) WITH PROPOFOL  N/A 03/21/2016   Procedure: ESOPHAGOGASTRODUODENOSCOPY (EGD) WITH PROPOFOL ;  Surgeon: Jerrell Sol, MD;  Location: [facility];  Service: Endoscopy;  Laterality: N/A;   RIGHT/LEFT HEART CATH AND CORONARY ANGIOGRAPHY N/A 10/02/2017   Procedure: RIGHT/LEFT HEART CATH AND CORONARY ANGIOGRAPHY;  Surgeon: Mady Bruckner, MD;  Location: [facility];  Service: Cardiovascular;  Laterality: N/A;   TUBAL LIGATION     VIDEO BRONCHOSCOPY Bilateral 05/18/2019   Procedure: VIDEO BRONCHOSCOPY WITHOUT FLUORO;  Surgeon: Geronimo Amel, MD;  Location: [facility] ENDOSCOPY;  Service: Endoscopy;  Laterality: Bilateral;   Social History:  reports that she quit smoking about 14 years ago. Her smoking use included cigarettes. She started smoking about 62 years ago. She has a 48 pack-year smoking history. She has never used smokeless tobacco. She reports that she does not drink alcohol and does not use drugs.  Allergies  Allergen Reactions   Contrast Media [Iodinated Contrast Media] Rash and Other (See Comments)    Severe rash  Crestor [Rosuvastatin] Hives and Other (See Comments)    welps   Sulfonamide Derivatives Hives    As a child   Actonel [Risedronate]     Other reaction(s): chest pain   Amoxicillin  Hives and Other (See Comments)    Has patient had a PCN reaction causing immediate rash, facial/tongue/throat swelling, SOB or lightheadedness with hypotension: Yes  Has patient had a PCN reaction causing severe rash involving mucus membranes or skin necrosis: No  Has patient had a PCN reaction that required hospitalization Yes  Has patient had a PCN reaction occurring within the last 10 years: No  If all of the above answers are NO, then may proceed with Cephalosporin use.  Has patient had a PCN reaction causing  immediate rash, facial/tongue/throat swelling, SOB or lightheadedness with hypotension: Yes, Has patient had a PCN reaction causing severe rash involving mucus membranes or skin necrosis: No, Has patient had a PCN reaction that required hospitalization Yes, Has patient had a PCN reaction occurring within the last 10 years: No, If all of the above answers are NO, then may proceed with Cephalosporin use.   Doxycycline  Other (See Comments)   Parathyroid Hormone (Recomb)     Other reaction(s): hair loss or weakness   Sulfamethoxazole Other (See Comments)   Family History  Problem Relation Age of Onset   Heart attack Mother 78   Heart attack Father 13   Cancer Brother    Family history: Family history reviewed and not pertinent.  Prior to Admission medications   Medication Sig Start Date End Date Taking? Authorizing Provider  albuterol  (VENTOLIN  HFA) 108 (90 Base) MCG/ACT inhaler INHALE TWO PUFFS INTO THE LUNGS EVERY SIX HOURS AS NEEDED FOR WHEEZING OR SHORTNESS OF BREATH 01/30/23   Geronimo Amel, MD  Ascorbic Acid  (VITAMIN C ) 500 MG CAPS Take 1 capsule by mouth every evening.    [provider]  aspirin  EC 81 MG tablet Take 81 mg by mouth daily.    [provider]  CALCIUM  PO Take 1 tablet by mouth every evening.    [provider]  Cholecalciferol  (VITAMIN D3) 125 MCG (5000 UT) CAPS Take 5,000 Units by mouth every evening.    [provider]  clonazePAM  (KLONOPIN ) 1 MG tablet Take 1 mg by mouth 3 (three) times daily as needed for anxiety.     [provider]  COENZYME Q10 PO Take 1 capsule by mouth every evening.    [provider]  doxepin  (SINEQUAN ) 25 MG capsule Take 1 capsule (25 mg total) by mouth at bedtime. 01/04/15   Plovsky, Elna, MD  fluticasone (FLONASE) 50 MCG/ACT nasal spray Place 2 sprays into both nostrils daily as needed for rhinitis or allergies.    [provider]  gabapentin  (NEURONTIN ) 100 MG capsule TAKE  ONE CAPSULE (100 MG TOTAL) BY MOUTH DAILY AS NEEDED (PAIN). 09/05/23   Skeet Juliene SAUNDERS, DO  gabapentin  (NEURONTIN ) 300 MG capsule TAKE ONE CAPSULE BY MOUTH DAILY 11/20/23   Skeet Juliene SAUNDERS, DO  guaiFENesin (MUCINEX) 600 MG 12 hr tablet Take 600 mg by mouth 2 (two) times daily as needed for cough or to loosen phlegm.     [provider]  Homeopathic Products [facility] DRY EYE RELIEF OP) Place 1 drop into both eyes 2 (two) times daily.    [provider]  ipratropium (ATROVENT ) 0.02 % nebulizer solution ONE VIAL (2 AND ONE HALF (1/2) MLS) BY NEBULIZER FOUR TIMES DAILY 10/29/23   Geronimo Amel, MD  isosorbide   mononitrate (IMDUR ) 120 MG 24 hr tablet TAKE ONE TABLET (120 MG TOTAL) BY MOUTH IN THE MORNING AND AT BEDTIME. 06/02/23   Gollan, Timothy J, MD  loratadine  (CLARITIN ) 10 MG tablet Take 10 mg by mouth daily.    [provider]  losartan  (COZAAR ) 25 MG tablet TAKE ONE TABLET BY MOUTH TWICE A DAY 08/11/23   Gollan, Timothy J, MD  lovastatin  (MEVACOR ) 40 MG tablet TAKE ONE TABLET BY MOUTH EVERY NIGHT AT BEDTIME 08/11/23   Gollan, Timothy J, MD  metoprolol  succinate (TOPROL -XL) 25 MG 24 hr tablet Take 1 tablet (25 mg total) by mouth daily. 08/12/23   Gollan, Timothy J, MD  modafinil  (PROVIGIL ) 200 MG tablet Take 1 tablet (200 mg total) by mouth 2 (two) times daily. 10/08/23   Skeet Juliene SAUNDERS, DO  mometasone  (NASONEX ) 50 MCG/ACT nasal spray SPRAY 2 SPRAYS INTO THE NOSE DAILY 12/07/20   Geronimo Amel, MD  Multiple Vitamin (MULTIVITAMIN WITH MINERALS) TABS tablet Take 1 tablet by mouth every evening.    [provider]  nitroGLYCERIN  (NITROSTAT ) 0.4 MG SL tablet DISSOLVE ONE TABLET UNDER TONGUE AS NEEDED FOR CHEST PAIN. MAY REPEAT FIVE MINUTES APART THREE TIMES IF NEEDED 05/19/23   Alvan Dorn FALCON, MD  omeprazole  (PRILOSEC) 20 MG capsule TAKE ONE CAPSULE BY MOUTH ONCE DAILY 10/01/23   Geronimo Amel, MD  oxyCODONE -acetaminophen  (PERCOCET) 5-325 MG per tablet Take 1 tablet by  mouth every 4 (four) hours as needed for moderate pain or severe pain.    [provider]  potassium chloride  SA (KLOR-CON  M20) 20 MEQ tablet Take 40 mg ( 2 tablets) daily for 4 days 09/13/21   Alvan Dorn FALCON, MD  PRISTIQ  50 MG 24 hr tablet Take 50 mg by mouth every evening.  07/06/15   [provider]  torsemide  (DEMADEX ) 20 MG tablet TAKE ONE TABLET BY MOUTH AS NEEDED FOR SWELLING AND WEIGHT GAIN 07/17/23   Gollan, Timothy J, MD  umeclidinium-vilanterol (ANORO ELLIPTA ) 62.5-25 MCG/ACT AEPB INAHLE ONE PUFF INTO THE LUNGS DAILY 07/18/23   Geronimo Amel, MD  vitamin B-12 (CYANOCOBALAMIN ) 1000 MCG tablet Take 1,500 mcg by mouth daily.    [provider]   Physical Exam: Vitals:   12/09/23 2200 12/09/23 2236 12/09/23 2249 12/09/23 2300  BP: (!) 94/48 (!) 97/55  131/71  Pulse: 69 68 66 66  Resp: 12 11 11 15   Temp:      TempSrc:      SpO2: 95% 97% 99% 98%   Constitutional: appears age-appropriate, frail, lethargic, weak Eyes: PERRL, lids and conjunctivae normal ENMT: Mucous membranes are moist. Posterior pharynx clear of any exudate or lesions. Age-appropriate dentition.  Unable to assess hearing appropriately Neck: normal, supple, no masses, no thyromegaly Respiratory: clear to auscultation bilaterally, no wheezing, no crackles. Normal respiratory effort. No accessory muscle use.  Cardiovascular: Regular rate and rhythm, no murmurs / rubs / gallops. No extremity edema. 2+ pedal pulses. No carotid bruits.  Abdomen: no tenderness, no masses palpated, no hepatosplenomegaly. Bowel sounds positive.  Musculoskeletal: no clubbing / cyanosis. No joint deformity upper and lower extremities. Good ROM, no contractures, no atrophy. Normal muscle tone.  Skin: no rashes, lesions, ulcers. No induration Neurologic: Unable to assess sensation, strength Psychiatric: Unable to assess judgment and insight. Alert and oriented x self and age.  Confused and lethargic mood.   EKG:  independently reviewed, showing sinus rhythm with rate of 77, QTc 521, left bundle branch block  Chest x-ray on Admission: I personally reviewed  and I agree with radiologist reading as below.  DG Chest Portable 1 View Result Date: 12/09/2023 CLINICAL DATA:  Chest pain. Altered mental status. Nitroglycerin  found in the car. EXAM: PORTABLE CHEST 1 VIEW COMPARISON:  10/20/2023 FINDINGS: Shallow inspiration. Heart size and pulmonary vascularity are normal for technique. Peribronchial thickening with streaky perihilar opacities likely representing bronchitis or airways disease. No focal consolidation or edema. No pleural effusion or pneumothorax. Mediastinal contours appear intact. Calcification of the aorta. IMPRESSION: Shallow inspiration. Peribronchial thickening with streaky perihilar opacities suggesting bronchitis or airways disease. No focal consolidation. Electronically Signed   By: Elsie Gravely M.D.   On: 12/09/2023 22:06   CT Head Wo Contrast Result Date: 12/09/2023 CLINICAL DATA:  Mental status change, unknown cause EXAM: CT HEAD WITHOUT CONTRAST TECHNIQUE: Contiguous axial images were obtained from the base of the skull through the vertex without intravenous contrast. RADIATION DOSE REDUCTION: This exam was performed according to the departmental dose-optimization program which includes automated exposure control, adjustment of the mA and/or kV according to patient size and/or use of iterative reconstruction technique. COMPARISON:  02/02/2022 FINDINGS: Brain: Chronic small vessel disease throughout the deep white matter. No acute intracranial abnormality. Specifically, no hemorrhage, hydrocephalus, mass lesion, acute infarction, or significant intracranial injury. Vascular: No hyperdense vessel or unexpected calcification. Skull: No acute calvarial abnormality. Sinuses/Orbits: No acute findings Other: None IMPRESSION: Chronic small vessel disease throughout the deep white matter. No acute  intracranial abnormality. Electronically Signed   By: Franky Crease M.D.   On: 12/09/2023 22:02   Labs on Admission: I have personally reviewed following labs  CBC: Recent Labs  Lab 12/09/23 2135  WBC 8.0  NEUTROABS 4.7  HGB 9.7*  HCT 30.3*  MCV 93.2  PLT 204   Basic Metabolic Panel: Recent Labs  Lab 12/09/23 2135  NA 138  K 3.7  CL 102  CO2 24  GLUCOSE 78  BUN 34*  CREATININE 1.62*  CALCIUM  8.1*   GFR: CrCl cannot be calculated (Unknown ideal weight.). Liver Function Tests: Recent Labs  Lab 12/09/23 2135  AST 27  ALT 14  ALKPHOS 89  BILITOT 0.4  PROT 6.3*  ALBUMIN 3.6   Urine analysis:    Component Value Date/Time   COLORURINE STRAW (A) 03/02/2022 2058   APPEARANCEUR CLEAR 03/02/2022 2058   LABSPEC 1.010 03/02/2022 2058   PHURINE 6.0 03/02/2022 2058   GLUCOSEU NEGATIVE 03/02/2022 2058   HGBUR SMALL (A) 03/02/2022 2058   BILIRUBINUR NEGATIVE 03/02/2022 2058   KETONESUR NEGATIVE 03/02/2022 2058   PROTEINUR NEGATIVE 03/02/2022 2058   UROBILINOGEN 0.2 09/17/2014 1814   NITRITE NEGATIVE 03/02/2022 2058   LEUKOCYTESUR TRACE (A) 03/02/2022 2058   CRITICAL CARE Performed by: Dr. Sherre  Total critical care time: 32 minutes  Critical care time was exclusive of separately billable procedures and treating other patients.  Critical care was necessary to treat or prevent imminent or life-threatening deterioration.  Critical care was time spent personally by me on the following activities: development of treatment plan with patient as well as nursing, discussions with consultants, evaluation of patient's response to treatment, examination of patient, obtaining history from patient or surrogate, ordering and performing treatments and interventions, ordering and review of laboratory studies, ordering and review of radiographic studies, pulse oximetry and re-evaluation of patient's condition.  Attempted to call daughter, Levon Beal at [phone], no pick up.  This  document was prepared using Dragon Voice Recognition software and may include unintentional dictation errors.  Dr. Sherre [facility]  If  7PM-7AM, please contact overnight-coverage provider If 7AM-7PM, please contact day attending provider [email or web address]  12/09/2023, 11:13 PM

## 2023-12-09 NOTE — Assessment & Plan Note (Signed)
 PDMP reviewed Home clonazepam  1 mg tablet 3 times daily as needed for anxiety, oxycodone -acetaminophen  5-325 mg p.o. not resumed admission due to low blood pressure

## 2023-12-09 NOTE — Assessment & Plan Note (Signed)
 Pravastatin  40 mg daily, aspirin  81 mg daily resumed Home Imdur  120 mg daily, metoprolol  succinate 25 mg daily, losartan  25 mg daily were not resumed on admission, a.m. team to resume when benefits outweigh the risk

## 2023-12-09 NOTE — ED Provider Notes (Signed)
 [facility] Provider Note    Event Date/Time   First MD Initiated Contact with Patient 12/09/23 2130     (approximate)   History   Chief Complaint Altered Mental Status   HPI  [name] is a 78 y.o. female with past medical history of hypertension, CAD, CHF, COPD, chronic pain syndrome, and anxiety who presents to the ED for altered mental status.  Per EMS, patient was found confused and somnolent in her car in a parking lot.  She had reportedly gone into the local vape shop about an hour prior to arrival, seem to be somewhat sleepy per staff there, then found to be increasingly confused and sleepy in her car.  When EMS arrived, they found an open bottle of nitroglycerin  and patient was able to wake up enough to state that she had some chest pain.  She told EMS that her chest pain had resolved, denies other complaints.  She continues to deny chest pain on arrival to the ED, states that she feels well.     Physical Exam   Triage Vital Signs: ED Triage Vitals [12/09/23 2129]  Encounter Vitals Group     BP (!) 86/61     Girls Systolic BP Percentile      Girls Diastolic BP Percentile      Boys Systolic BP Percentile      Boys Diastolic BP Percentile      Pulse Rate 76     Resp 11     Temp 98.1 F (36.7 C)     Temp Source Oral     SpO2      Weight      Height      Head Circumference      Peak Flow      Pain Score      Pain Loc      Pain Education      Exclude from Growth Chart     Most recent vital signs: Vitals:   12/09/23 2145 12/09/23 2200  BP:  (!) 94/48  Pulse: 72 69  Resp: 12 12  Temp:    SpO2: 96% 95%    Constitutional: Somnolent but arousable to voice. Eyes: Conjunctivae are normal.  Pupils equal, round, and reactive to light bilaterally. Head: Atraumatic. Nose: No congestion/rhinnorhea. Mouth/Throat: Mucous membranes are moist.  Cardiovascular: Normal rate, regular rhythm. Grossly normal heart sounds.  2+ radial pulses  bilaterally. Respiratory: Normal respiratory effort.  No retractions. Lungs CTAB. Gastrointestinal: Soft and nontender. No distention. Musculoskeletal: No lower extremity tenderness nor edema.  Neurologic:  Normal speech and language. No gross focal neurologic deficits are appreciated.    ED Results / Procedures / Treatments   Labs (all labs ordered are listed, but only abnormal results are displayed) Labs Reviewed  CBC WITH DIFFERENTIAL/PLATELET - Abnormal; Notable for the following components:      Result Value   RBC 3.25 (*)    Hemoglobin 9.7 (*)    HCT 30.3 (*)    All other components within normal limits  COMPREHENSIVE METABOLIC PANEL WITH GFR - Abnormal; Notable for the following components:   BUN 34 (*)    Creatinine, Ser 1.62 (*)    Calcium  8.1 (*)    Total Protein 6.3 (*)    GFR, Estimated 32 (*)    All other components within normal limits  BLOOD GAS, VENOUS - Abnormal; Notable for the following components:   pO2, Ven 50 (*)    Bicarbonate 28.9 (*)  Acid-Base Excess 2.7 (*)    All other components within normal limits  ETHANOL  URINE DRUG SCREEN, QUALITATIVE ([facility] ONLY)  URINALYSIS, ROUTINE W REFLEX MICROSCOPIC  TROPONIN I (HIGH SENSITIVITY)     EKG  ED ECG REPORT I, Carlin Palin, the attending physician, personally viewed and interpreted this ECG.   Date: 12/09/2023  EKG Time: 21:27  Rate: 77  Rhythm: normal sinus rhythm  Axis: Normal  Intervals:left bundle branch block  ST&T Change: None  RADIOLOGY CT head reviewed and interpreted by me with no hemorrhage or midline shift.  PROCEDURES:  Critical Care performed: Yes, see critical care procedure note(s)  .Critical Care  Performed by: Palin Carlin, MD Authorized by: Palin Carlin, MD   Critical care provider statement:    Critical care time (minutes):  30   Critical care time was exclusive of:  Separately billable procedures and treating other patients and teaching time   Critical  care was necessary to treat or prevent imminent or life-threatening deterioration of the following conditions:  Respiratory failure   Critical care was time spent personally by me on the following activities:  Development of treatment plan with patient or surrogate, discussions with consultants, evaluation of patient's response to treatment, examination of patient, ordering and review of laboratory studies, ordering and review of radiographic studies, ordering and performing treatments and interventions, pulse oximetry, re-evaluation of patient's condition and review of old charts   I assumed direction of critical care for this patient from another provider in my specialty: no     Care discussed with: admitting provider      MEDICATIONS ORDERED IN ED: Medications  sodium chloride  0.9 % bolus 1,000 mL (1,000 mLs Intravenous New Bag/Given 12/09/23 2133)  sodium chloride  0.9 % bolus 1,000 mL (1,000 mLs Intravenous New Bag/Given 12/09/23 2240)     IMPRESSION / MDM / ASSESSMENT AND PLAN / ED COURSE  I reviewed the triage vital signs and the nursing notes.                              78 y.o. female with past medical history of hypertension, CAD, CHF, COPD, chronic pain syndrome, and anxiety who presents to the ED complaining of altered mental status after she was found somnolent in her car.  Patient's presentation is most consistent with acute presentation with potential threat to life or bodily function.  Differential diagnosis includes, but is not limited to, ACS, PE, pneumonia, pneumothorax, intracranial process, stroke, anemia, electrolyte abnormality, medication effect.  Patient nontoxic-appearing and in no acute distress, vital signs remarkable for borderline hypotension and patient also noted to be hypoxic to 88% on room air.  She was placed on 2 L nasal cannula with improvement, VBG is reassuring without significant hypercapnia.  She does not have any focal neurologic deficits on exam, CT  head is pending.  Patient prescribed both oxycodone  and Klonopin , which could be the cause of her altered mental status.  Lab results and urinalysis are also pending at this time.  CT head is negative for acute process, chest x-ray also unremarkable.  Labs with AKI and patient continues to have borderline low BP, will give additional IV fluids but she currently has a MAP of 69.  No significant anemia, leukocytosis, electrolyte abnormality noted.  Troponin within normal limits.  She continues to protect her airway on reassessment and case discussed with hospitalist for admission.      FINAL CLINICAL IMPRESSION(S) / ED  DIAGNOSES   Final diagnoses:  Altered mental status, unspecified altered mental status type  Acute respiratory failure with hypoxia (HCC)     Rx / DC Orders   ED Discharge Orders     None        Note:  This document was prepared using Dragon voice recognition software and may include unintentional dictation errors.   Willo Dunnings, MD 12/09/23 [phone]

## 2023-12-09 NOTE — Assessment & Plan Note (Addendum)
 Maintain n.p.o. except for sips of meds and ice chips due to aspiration risk Fall precaution, aspiration precaution

## 2023-12-09 NOTE — Assessment & Plan Note (Signed)
 Antihypertensive medication not resumed on admission due to borderline low normotension in the setting of recent nitroglycerin  use unclear number of doses A.m. team to resume home antihypertensive medications when benefits outweigh the

## 2023-12-09 NOTE — ED Triage Notes (Signed)
 Pt to ED via [facility] from vape shop c/o AMS. Pt able to respond to her name but does not answer questions. Pt mumbles incoherently. Per EMS pt found with nitroglycerin  in car. Hx CHF, CAD, COPD.

## 2023-12-09 NOTE — ED Notes (Signed)
 CCMD called to initiate cardiac monitoring

## 2023-12-09 NOTE — ED Notes (Signed)
 Pt taken to CT.

## 2023-12-09 NOTE — Assessment & Plan Note (Signed)
-   As needed nicotine patch ordered for nicotine craving

## 2023-12-09 NOTE — Assessment & Plan Note (Signed)
 Range over the last year has been 9.6-11.5 Patient would benefit from outpatient workup

## 2023-12-09 NOTE — Assessment & Plan Note (Signed)
 Pravastatin  40 mg nightly resume for 12/10/2023

## 2023-12-09 NOTE — Assessment & Plan Note (Signed)
 Suspect secondary to nitroglycerin  use Unclear how many nitroglycerin  dose that she gave herself Status post sodium chloride  2 L bolus per EP Goal MAP greater than 65 LR 1 L bolus ordered on admission Continuous sodium chloride  IVF at 150 mL/h, 1 day ordered Admit to PCU, inpatient

## 2023-12-10 DIAGNOSIS — I25119 Atherosclerotic heart disease of native coronary artery with unspecified angina pectoris: Secondary | ICD-10-CM

## 2023-12-10 DIAGNOSIS — E782 Mixed hyperlipidemia: Secondary | ICD-10-CM

## 2023-12-10 DIAGNOSIS — R400 Somnolence: Secondary | ICD-10-CM | POA: Diagnosis not present

## 2023-12-10 DIAGNOSIS — N179 Acute kidney failure, unspecified: Secondary | ICD-10-CM

## 2023-12-10 DIAGNOSIS — I959 Hypotension, unspecified: Secondary | ICD-10-CM

## 2023-12-10 DIAGNOSIS — Z87891 Personal history of nicotine dependence: Secondary | ICD-10-CM

## 2023-12-10 DIAGNOSIS — D649 Anemia, unspecified: Secondary | ICD-10-CM

## 2023-12-10 LAB — URINE DRUG SCREEN, QUALITATIVE (ARMC ONLY)
Amphetamines, Ur Screen: NOT DETECTED
Barbiturates, Ur Screen: NOT DETECTED
Benzodiazepine, Ur Scrn: NOT DETECTED
Cannabinoid 50 Ng, Ur ~~LOC~~: NOT DETECTED
Cocaine Metabolite,Ur ~~LOC~~: NOT DETECTED
MDMA (Ecstasy)Ur Screen: NOT DETECTED
Methadone Scn, Ur: NOT DETECTED
Opiate, Ur Screen: NOT DETECTED
Phencyclidine (PCP) Ur S: NOT DETECTED
Tricyclic, Ur Screen: POSITIVE — AB

## 2023-12-10 LAB — CBC
HCT: 28.6 % — ABNORMAL LOW (ref 36.0–46.0)
Hemoglobin: 9.2 g/dL — ABNORMAL LOW (ref 12.0–15.0)
MCH: 30.3 pg (ref 26.0–34.0)
MCHC: 32.2 g/dL (ref 30.0–36.0)
MCV: 94.1 fL (ref 80.0–100.0)
Platelets: 159 K/uL (ref 150–400)
RBC: 3.04 MIL/uL — ABNORMAL LOW (ref 3.87–5.11)
RDW: 13.5 % (ref 11.5–15.5)
WBC: 6.1 K/uL (ref 4.0–10.5)
nRBC: 0 % (ref 0.0–0.2)

## 2023-12-10 LAB — URINALYSIS, ROUTINE W REFLEX MICROSCOPIC
Bilirubin Urine: NEGATIVE
Glucose, UA: NEGATIVE mg/dL
Hgb urine dipstick: NEGATIVE
Ketones, ur: NEGATIVE mg/dL
Leukocytes,Ua: NEGATIVE
Nitrite: POSITIVE — AB
Protein, ur: NEGATIVE mg/dL
Specific Gravity, Urine: 1.005 (ref 1.005–1.030)
pH: 5 (ref 5.0–8.0)

## 2023-12-10 LAB — BASIC METABOLIC PANEL WITH GFR
Anion gap: 6 (ref 5–15)
BUN: 29 mg/dL — ABNORMAL HIGH (ref 8–23)
CO2: 25 mmol/L (ref 22–32)
Calcium: 7.4 mg/dL — ABNORMAL LOW (ref 8.9–10.3)
Chloride: 109 mmol/L (ref 98–111)
Creatinine, Ser: 1.41 mg/dL — ABNORMAL HIGH (ref 0.44–1.00)
GFR, Estimated: 38 mL/min — ABNORMAL LOW (ref >60)
Glucose, Bld: 93 mg/dL (ref 70–99)
Potassium: 3.5 mmol/L (ref 3.5–5.1)
Sodium: 140 mmol/L (ref 135–145)

## 2023-12-10 LAB — TROPONIN I (HIGH SENSITIVITY): Troponin I (High Sensitivity): 6 ng/L (ref <18)

## 2023-12-10 NOTE — ED Notes (Signed)
 Dezii, DO told this tech that pt is planning to leave AMA. Pt also told DO that this tech needs to learn a life lesson about how you never know what you're going to walk into and that I was just trying to teach that little girl a life lesson Will make Olam, RN aware.

## 2023-12-10 NOTE — ED Notes (Signed)
 This tech was sent into pt room by RN to check leads after a call from CCMD stating that the leads were off of pt. This tech into room to find pt getting out of bed, with brief on the floor, leads, BP cuff, and pulse off have been taken off by the pt. Pt states I have to wet I have wet already I have to wet I have no pants on and I'm wetting all over the floor. Pt is peeing on the floor while standing, pt was shown where the bathroom was, steady gait to bathroom on own with this tech behind her. Pt peeing all over the floor on the way to the bathroom, in the bathroom, and back to room. EVS was by room and cleaned up the floor. Pt refusing to get into the bed stating I am not getting to the bed due to the fact I want to go home Sorrento, RN into pt room to assess situation. Pt reporting that she had been yelling about needing to use the bathroom, no RN or this tech has heard pt yelling. Pt also had a call bell that was not used. Pt stating she didn't know she had a call bell. Pt stating to this tech and that little girl has no right to tell me about a call bell when I've been yelling. Pt demanding to go home, stating take this thing out of my arm and yelling at the pump as it is alarming. Dezii, DO notified about interaction, as she was on floor already.

## 2023-12-10 NOTE — Discharge Summary (Signed)
 Physician Discharge Summary  [name] FMW:[id number] DOB: [birth year] DOA: 12/09/2023  PCP: Sun, Vyvyan, MD  Admit date: 12/09/2023 Discharge date: 12/10/2023   Recommendations for Outpatient Follow-up:  Left AMA 7/9.    Hospital Course: [name] is a 78 year old female with hypertension, CAD, COPD, chronic pain syndrome, anxiety, who presented to the ED with altered mentation.  Per documentation patient was found to be hypoxic at 88% on room air after being picked up by EMS.  EMS reports the patient was found outside in the vape clinic asleep in her car with an open bottle of nitroglycerin  next to her.  Patient reported to admitting MD that she took nitroglycerin  prior to arrival.  On my evaluation this morning 7/9 patient denies ever having taken her nitroglycerin .  She reports that she has recently been very stressed out and tired due to her social situation so she simply took a nap in her car.  She reports today feeling 100% back to normal.  She is alert and oriented x 4.  We discussed that she has an AKI and that I recommend further monitoring of her mental status and physical abilities with PT evals.  The patient is refusing these recommendations.  She reports that she must go home today and clean her apartment.  I encouraged the patient to stay and discussed with her that leaving before adequate workup could result in organ failure, recurrence of her altered mentation, or even death.  She endorses understanding.  She demonstrates that she does have capacity to make these decisions.  She signed AMA paperwork and left the hospital.   Discharge Instructions     Allergies  Allergen Reactions   Contrast Media [Iodinated Contrast Media] Rash and Other (See Comments)    Severe rash   Crestor [Rosuvastatin] Hives and Other (See Comments)    welps   Sulfonamide Derivatives Hives    As a child   Actonel [Risedronate]     Other reaction(s): chest pain   Amoxicillin  Hives and Other  (See Comments)    Has patient had a PCN reaction causing immediate rash, facial/tongue/throat swelling, SOB or lightheadedness with hypotension: Yes  Has patient had a PCN reaction causing severe rash involving mucus membranes or skin necrosis: No  Has patient had a PCN reaction that required hospitalization Yes  Has patient had a PCN reaction occurring within the last 10 years: No  If all of the above answers are NO, then may proceed with Cephalosporin use.  Has patient had a PCN reaction causing immediate rash, facial/tongue/throat swelling, SOB or lightheadedness with hypotension: Yes, Has patient had a PCN reaction causing severe rash involving mucus membranes or skin necrosis: No, Has patient had a PCN reaction that required hospitalization Yes, Has patient had a PCN reaction occurring within the last 10 years: No, If all of the above answers are NO, then may proceed with Cephalosporin use.   Doxycycline  Other (See Comments)   Parathyroid Hormone (Recomb)     Other reaction(s): hair loss or weakness   Sulfamethoxazole Other (See Comments)    Consultations:    Procedures/Studies: DG Chest Portable 1 View Result Date: 12/09/2023 CLINICAL DATA:  Chest pain. Altered mental status. Nitroglycerin  found in the car. EXAM: PORTABLE CHEST 1 VIEW COMPARISON:  10/20/2023 FINDINGS: Shallow inspiration. Heart size and pulmonary vascularity are normal for technique. Peribronchial thickening with streaky perihilar opacities likely representing bronchitis or airways disease. No focal consolidation or edema. No pleural effusion or pneumothorax. Mediastinal contours appear intact.  Calcification of the aorta. IMPRESSION: Shallow inspiration. Peribronchial thickening with streaky perihilar opacities suggesting bronchitis or airways disease. No focal consolidation. Electronically Signed   By: Elsie Gravely M.D.   On: 12/09/2023 22:06   CT Head Wo Contrast Result Date: 12/09/2023 CLINICAL DATA:  Mental  status change, unknown cause EXAM: CT HEAD WITHOUT CONTRAST TECHNIQUE: Contiguous axial images were obtained from the base of the skull through the vertex without intravenous contrast. RADIATION DOSE REDUCTION: This exam was performed according to the departmental dose-optimization program which includes automated exposure control, adjustment of the mA and/or kV according to patient size and/or use of iterative reconstruction technique. COMPARISON:  02/02/2022 FINDINGS: Brain: Chronic small vessel disease throughout the deep white matter. No acute intracranial abnormality. Specifically, no hemorrhage, hydrocephalus, mass lesion, acute infarction, or significant intracranial injury. Vascular: No hyperdense vessel or unexpected calcification. Skull: No acute calvarial abnormality. Sinuses/Orbits: No acute findings Other: None IMPRESSION: Chronic small vessel disease throughout the deep white matter. No acute intracranial abnormality. Electronically Signed   By: Franky Crease M.D.   On: 12/09/2023 22:02      Discharge Exam: Vitals:   12/10/23 0933 12/10/23 1000  BP: (!) 103/56 121/65  Pulse: 68 73  Resp: 20 (!) 25  Temp:    SpO2: 97% 100%   Vitals:   12/10/23 0801 12/10/23 0933 12/10/23 0943 12/10/23 1000  BP:  (!) 103/56  121/65  Pulse:  68  73  Resp:  20  (!) 25  Temp: 97.6 F (36.4 C)     TempSrc: Oral     SpO2:  97%  100%  Weight:   53.5 kg   Height:   5' 1 (1.549 m)     Constitutional:  Normal appearance. Non toxic-appearing.  HENT: Head Normocephalic and atraumatic.  Mucous membranes are moist.  Eyes:  Extraocular intact. Conjunctivae normal.  Cardiovascular: Rate and Rhythm: Normal rate and regular rhythm.  Pulmonary: Non labored, symmetric rise of chest wall.  Skin: warm and dry. not jaundiced.  Neurological: No focal deficit present. alert. Oriented.  Psychiatric: Mood and Affect congruent. Speech is pressured   The results of significant diagnostics from this  hospitalization (including imaging, microbiology, ancillary and laboratory) are listed below for reference.     Microbiology: No results found for this or any previous visit (from the past 240 hours).   Labs: BNP (last 3 results) Recent Labs    10/20/23 1627  BNP 32.4   Basic Metabolic Panel: Recent Labs  Lab 12/09/23 2135 12/10/23 0437  NA 138 140  K 3.7 3.5  CL 102 109  CO2 24 25  GLUCOSE 78 93  BUN 34* 29*  CREATININE 1.62* 1.41*  CALCIUM  8.1* 7.4*   Liver Function Tests: Recent Labs  Lab 12/09/23 2135  AST 27  ALT 14  ALKPHOS 89  BILITOT 0.4  PROT 6.3*  ALBUMIN 3.6   No results for input(s): LIPASE, AMYLASE in the last 168 hours. No results for input(s): AMMONIA in the last 168 hours. CBC: Recent Labs  Lab 12/09/23 2135 12/10/23 0437  WBC 8.0 6.1  NEUTROABS 4.7  --   HGB 9.7* 9.2*  HCT 30.3* 28.6*  MCV 93.2 94.1  PLT 204 159   Cardiac Enzymes: No results for input(s): CKTOTAL, CKMB, CKMBINDEX, TROPONINI in the last 168 hours. BNP: Invalid input(s): POCBNP CBG: No results for input(s): GLUCAP in the last 168 hours. D-Dimer No results for input(s): DDIMER in the last 72 hours. Hgb A1c No results for  input(s): HGBA1C in the last 72 hours. Lipid Profile No results for input(s): CHOL, HDL, LDLCALC, TRIG, CHOLHDL, LDLDIRECT in the last 72 hours. Thyroid  function studies No results for input(s): TSH, T4TOTAL, T3FREE, THYROIDAB in the last 72 hours.  Invalid input(s): FREET3 Anemia work up No results for input(s): VITAMINB12, FOLATE, FERRITIN, TIBC, IRON, RETICCTPCT in the last 72 hours. Urinalysis    Component Value Date/Time   COLORURINE STRAW (A) 12/10/2023 0220   APPEARANCEUR CLEAR (A) 12/10/2023 0220   LABSPEC 1.005 12/10/2023 0220   PHURINE 5.0 12/10/2023 0220   GLUCOSEU NEGATIVE 12/10/2023 0220   HGBUR NEGATIVE 12/10/2023 0220   BILIRUBINUR NEGATIVE 12/10/2023 0220   KETONESUR  NEGATIVE 12/10/2023 0220   PROTEINUR NEGATIVE 12/10/2023 0220   UROBILINOGEN 0.2 09/17/2014 1814   NITRITE POSITIVE (A) 12/10/2023 0220   LEUKOCYTESUR NEGATIVE 12/10/2023 0220   Sepsis Labs Recent Labs  Lab 12/09/23 2135 12/10/23 0437  WBC 8.0 6.1   Microbiology No results found for this or any previous visit (from the past 240 hours).   Time coordinating discharge: 32 min   SIGNED: Lorane Poland, MD  [facility] 12/10/2023, 1:13 PM Pager   If 7PM-7AM, please contact night-coverage

## 2023-12-10 NOTE — ED Notes (Signed)
 Pt assisted onto bed pan. New brief applied

## 2023-12-10 NOTE — ED Notes (Addendum)
 IV removed by patient. Pt refusing IV fluids at this time and stating she is going home. Pt yelling at this RN and tech. See previous note by tech. Dezii, DO, made aware. DO now at bedside.

## 2023-12-11 ENCOUNTER — Telehealth: Payer: Self-pay | Admitting: Cardiology

## 2023-12-11 NOTE — Telephone Encounter (Signed)
 Patient reports she was in the ED last night but left AMA. She has been having bad swelling to her hands arms and all over. She feels like she needs to take a stronger dose of her medication. She has no daily weights due to being kicked out of her home and husband took the scale from her. Advised that I would send her request to provider for review and recommendations.   Patient then went on to say that she went to [employer] and then she ended up in a different location. She was found to be asleep and not responsive so they called 911.  She then went on about her being but out of her home because her husband wanted her to die. Inquired if she is still homeless and she reports being in apartment now.   Her conversation was all over the place about different topics. Again advised that I would have provider look at this note and then we will call her back.

## 2023-12-11 NOTE — Telephone Encounter (Signed)
 Pt c/o medication issue:  1. Name of Medication:   torsemide  (DEMADEX ) 20 MG tablet    2. How are you currently taking this medication (dosage and times per day)?    3. Are you having a reaction (difficulty breathing--STAT)? no  4. What is your medication issue? Patient feels that the medication should be stronger. She is holding too much water. Please advise

## 2023-12-12 DIAGNOSIS — N179 Acute kidney failure, unspecified: Secondary | ICD-10-CM | POA: Diagnosis not present

## 2023-12-12 DIAGNOSIS — R410 Disorientation, unspecified: Secondary | ICD-10-CM | POA: Diagnosis not present

## 2023-12-12 DIAGNOSIS — I5042 Chronic combined systolic (congestive) and diastolic (congestive) heart failure: Secondary | ICD-10-CM | POA: Diagnosis not present

## 2023-12-12 NOTE — Telephone Encounter (Signed)
 No answer and voicemail box is full    Perla Evalene PARAS, MD  Sent: [place] December 12, 2023  2:53 PM   Message  She was recently in the hospital,  Lab work showed she was dehydrated creatinine 1.6 which was well above her baseline  Would not recommend extra torsemide  and in fact probably does not need to be taking it on a regular basis  Her swelling is likely coming from something else such as anemia, hemoglobin down to 9.2  This is something her primary care physician may be able to help her with  Might need iron pill if iron level is low  Thx  TGollan

## 2023-12-15 ENCOUNTER — Ambulatory Visit: Payer: Self-pay

## 2023-12-15 NOTE — Telephone Encounter (Signed)
 Reviewed provider recommendations on her torsemide . Also encouraged her to reach out to her PCP for further management of her anemia. She verbalized understanding with no further questions at this time.
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
## 2023-12-16 ENCOUNTER — Ambulatory Visit: Admitting: Pulmonary Disease

## 2023-12-16 ENCOUNTER — Encounter: Payer: Self-pay | Admitting: Pulmonary Disease

## 2023-12-16 VITALS — BP 124/71 | HR 100 | Ht 61.0 in | Wt 121.0 lb

## 2023-12-16 DIAGNOSIS — J439 Emphysema, unspecified: Secondary | ICD-10-CM

## 2023-12-16 DIAGNOSIS — Z87891 Personal history of nicotine dependence: Secondary | ICD-10-CM

## 2023-12-16 DIAGNOSIS — R0789 Other chest pain: Secondary | ICD-10-CM | POA: Diagnosis not present
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
## 2023-12-18 ENCOUNTER — Ambulatory Visit: Admitting: Internal Medicine

## 2023-12-18 ENCOUNTER — Ambulatory Visit: Payer: Self-pay

## 2023-12-18 NOTE — Telephone Encounter (Signed)
 FYI Only or Action Required?: FYI only for provider.  Patient is followed in Pulmonology for emphysema, last seen on 12/16/2023 by Hunsucker, Donnice SAUNDERS, MD.  Called Nurse Triage reporting Shortness of Breath and Medication Reaction.  Symptoms began several days ago.  Interventions attempted: Prescription medications: reports abx for UTI.  Symptoms are: gradually worsening.  Triage Disposition: Go to ED Now (or PCP Triage), See HCP Within 4 Hours (Or PCP Triage)  Patient/caregiver understands and will follow disposition?: Unsure    Copied from CRM [id number]. Topic: Clinical - Red Word Triage >> Dec 18, 2023  1:23 PM [name] wrote: [name] that prompted transfer to Nurse Triage: Some shortness of breath, mouth is burning, and body is jerking. Reason for Disposition  Patient sounds very sick or weak to the triager  Answer Assessment - Initial Assessment Questions 1. SYMPTOM: What's the main symptom you're concerned about? (e.g., chapped lips, dry mouth, lump, sores)     The abs are causing Burn my mouth and SOB, endorses throat swelling, more lip swelling 2. ONSET: When did the  sx   start?     X 1 week 3. PAIN: Is there any pain? If Yes, ask: How bad is it? (Scale: 0-10; or none, mild, moderate, severe)     Just burning It feels like there's a knot or something that's in there 4. CAUSE: What do you think is causing the symptoms?     Abx - reports has not finished abx yet and has only been on for few days 5. OTHER SYMPTOMS: Do you have any other symptoms? (e.g., fever, sore throat, toothache, swelling)     Endorses bad UTI, and my body just jerks I can't be still 6. PREGNANCY: Is there any chance you are pregnant? When was your last menstrual period?     N/a    Pt called back to cancel acute appt made earlier today. Pt states that she is experiencing mouth burning, body jerking and lip swelling that may be related to abx (for UTI) as well as SOB. Triager  does not appreciate audible SOB/wheezing during call. Pt is speaking in full sentences.  Based on sx, triager strongly advised ED/calling 911 for further evaluation/sx.  Protocols used: Mouth Symptoms-A-AH, Lip Swelling-A-AH

## 2023-12-18 NOTE — Telephone Encounter (Signed)
 FYI Only or Action Required?: FYI only for provider.  Patient is followed in Pulmonology for emphysema, last seen on 12/16/2023 by Hunsucker, Donnice SAUNDERS, MD.  Called Nurse Triage reporting Shortness of Breath.  Symptoms began a week ago.  Interventions attempted: Rescue inhaler and Maintenance inhaler.  Symptoms are: unchanged.  Triage Disposition: See HCP Within 4 Hours (Or PCP Triage)  Patient/caregiver understands and will follow disposition?: Yes      Copied from CRM [id number]. Topic: Clinical - Red Word Triage >> Dec 18, 2023  9:07 AM Benton O wrote: [facility] that prompted transfer to Nurse Triage: short of breath  Just seen mathew hunsucker [place] Reason for Disposition  [1] MILD difficulty breathing (e.g., minimal/no SOB at rest, SOB with walking, pulse < 100) AND [2] NEW-onset or WORSE than normal  Answer Assessment - Initial Assessment Questions E2C2 Pulmonary Triage - Initial Assessment Questions Chief Complaint (e.g., cough, sob, wheezing, fever, chills, sweat or additional symptoms) *Go to specific symptom protocol after initial questions. SOB - requesting O2  How long have symptoms been present? X 1 week  Have you tested for COVID or Flu? Note: If not, ask patient if a home test can be taken. If so, instruct patient to call back for positive results. No  MEDICINES:   Have you used any OTC meds to help with symptoms? No If yes, ask What medications? N/a  Have you used your inhalers/maintenance medication? Yes If yes, What medications? Anoro - 1 puff daily ProAir  - 4x a day - does not provide much relief  If inhaler, ask How many puffs and how often? Note: Review instructions on medication in the chart. See above  OXYGEN: Do you wear supplemental oxygen? No If yes, How many liters are you supposed to use? N/a  Do you monitor your oxygen levels? No If yes, What is your reading (oxygen level) today? N/a  What is your usual  oxygen saturation reading?  (Note: Pulmonary O2 sats should be 90% or greater) unknown      1. RESPIRATORY STATUS: Describe your breathing? (e.g., wheezing, shortness of breath, unable to speak, severe coughing)      SOB 2. ONSET: When did this breathing problem begin?      Last week 3. PATTERN Does the difficult breathing come and go, or has it been constant since it started?      Comes and goes 4. SEVERITY: How bad is your breathing? (e.g., mild, moderate, severe)      Mild-mod [place] does not appreciate audible SOB/wheezing during call. Pt is speaking in full sentences.  5. RECURRENT SYMPTOM: Have you had difficulty breathing before? If Yes, ask: When was the last time? and What happened that time?      unknown 6. CARDIAC HISTORY: Do you have any history of heart disease? (e.g., heart attack, angina, bypass surgery, angioplasty)      CHF - endorses not taking torsemide  d/t dehydration 7. LUNG HISTORY: Do you have any history of lung disease?  (e.g., pulmonary embolus, asthma, emphysema)     COPD 8. CAUSE: What do you think is causing the breathing problem?      unknown 9. OTHER SYMPTOMS: Do you have any other symptoms? (e.g., chest pain, cough, dizziness, fever, runny nose)     Cough - productive clear/white 10. O2 SATURATION MONITOR:  Do you use an oxygen saturation monitor (pulse oximeter) at home? If Yes, ask: What is your reading (oxygen level) today? What is your usual oxygen saturation reading? (e.g.,  95%)       N/a 11. PREGNANCY: Is there any chance you are pregnant? When was your last menstrual period?       N/a 12. TRAVEL: Have you traveled out of the country in the last month? (e.g., travel history, exposures)       N/a    Triage was slightly limited d/t pt speech/mumbling. Scheduled with alternate pulmonologist at alternate location.  Protocols used: Breathing Difficulty-A-AH

## 2023-12-18 NOTE — Telephone Encounter (Signed)
 Pt is scheduled to see Dr Isaiah today. NFN

## 2023-12-23 DIAGNOSIS — G35 Multiple sclerosis: Secondary | ICD-10-CM | POA: Diagnosis not present

## 2023-12-23 DIAGNOSIS — Z23 Encounter for immunization: Secondary | ICD-10-CM | POA: Diagnosis not present

## 2023-12-23 DIAGNOSIS — G8929 Other chronic pain: Secondary | ICD-10-CM | POA: Diagnosis not present

## 2023-12-23 DIAGNOSIS — I5042 Chronic combined systolic (congestive) and diastolic (congestive) heart failure: Secondary | ICD-10-CM | POA: Diagnosis not present

## 2023-12-23 DIAGNOSIS — M810 Age-related osteoporosis without current pathological fracture: Secondary | ICD-10-CM | POA: Diagnosis not present

## 2023-12-23 DIAGNOSIS — E782 Mixed hyperlipidemia: Secondary | ICD-10-CM | POA: Diagnosis not present

## 2023-12-23 DIAGNOSIS — J449 Chronic obstructive pulmonary disease, unspecified: Secondary | ICD-10-CM | POA: Diagnosis not present

## 2023-12-23 DIAGNOSIS — I700 Atherosclerosis of aorta: Secondary | ICD-10-CM | POA: Diagnosis not present

## 2023-12-23 DIAGNOSIS — Z0000 Encounter for general adult medical examination without abnormal findings: Secondary | ICD-10-CM | POA: Diagnosis not present

## 2023-12-31 DIAGNOSIS — G894 Chronic pain syndrome: Secondary | ICD-10-CM | POA: Diagnosis not present

## 2023-12-31 DIAGNOSIS — M4722 Other spondylosis with radiculopathy, cervical region: Secondary | ICD-10-CM | POA: Diagnosis not present

## 2023-12-31 DIAGNOSIS — Z79891 Long term (current) use of opiate analgesic: Secondary | ICD-10-CM | POA: Diagnosis not present

## 2023-12-31 DIAGNOSIS — M4726 Other spondylosis with radiculopathy, lumbar region: Secondary | ICD-10-CM | POA: Diagnosis not present

## 2024-01-20 ENCOUNTER — Other Ambulatory Visit: Payer: Self-pay | Admitting: Neurology

## 2024-01-20 DIAGNOSIS — G35 Multiple sclerosis: Secondary | ICD-10-CM

## 2024-01-26 ENCOUNTER — Ambulatory Visit: Admitting: Internal Medicine

## 2024-01-26 ENCOUNTER — Encounter: Payer: Self-pay | Admitting: Internal Medicine

## 2024-01-26 VITALS — BP 124/68 | HR 85 | Ht 61.0 in | Wt 119.8 lb

## 2024-01-26 DIAGNOSIS — J439 Emphysema, unspecified: Secondary | ICD-10-CM | POA: Diagnosis not present

## 2024-01-26 DIAGNOSIS — Z09 Encounter for follow-up examination after completed treatment for conditions other than malignant neoplasm: Secondary | ICD-10-CM

## 2024-01-26 DIAGNOSIS — D649 Anemia, unspecified: Secondary | ICD-10-CM

## 2024-01-26 DIAGNOSIS — Z87891 Personal history of nicotine dependence: Secondary | ICD-10-CM | POA: Diagnosis not present

## 2024-01-26 DIAGNOSIS — R918 Other nonspecific abnormal finding of lung field: Secondary | ICD-10-CM

## 2024-01-26 DIAGNOSIS — N179 Acute kidney failure, unspecified: Secondary | ICD-10-CM | POA: Diagnosis not present

## 2024-01-26 DIAGNOSIS — J438 Other emphysema: Secondary | ICD-10-CM

## 2024-01-26 LAB — CBC WITH DIFFERENTIAL/PLATELET
Basophils Absolute: 0 K/uL (ref 0.0–0.1)
Basophils Relative: 0.5 % (ref 0.0–3.0)
Eosinophils Absolute: 0.2 K/uL (ref 0.0–0.7)
Eosinophils Relative: 3.4 % (ref 0.0–5.0)
HCT: 33.8 % — ABNORMAL LOW (ref 36.0–46.0)
Hemoglobin: 11 g/dL — ABNORMAL LOW (ref 12.0–15.0)
Lymphocytes Relative: 31.3 % (ref 12.0–46.0)
Lymphs Abs: 1.8 K/uL (ref 0.7–4.0)
MCHC: 32.5 g/dL (ref 30.0–36.0)
MCV: 90.3 fl (ref 78.0–100.0)
Monocytes Absolute: 0.5 K/uL (ref 0.1–1.0)
Monocytes Relative: 9.2 % (ref 3.0–12.0)
Neutro Abs: 3.1 K/uL (ref 1.4–7.7)
Neutrophils Relative %: 55.6 % (ref 43.0–77.0)
Platelets: 248 K/uL (ref 150.0–400.0)
RBC: 3.74 Mil/uL — ABNORMAL LOW (ref 3.87–5.11)
RDW: 13.1 % (ref 11.5–15.5)
WBC: 5.7 K/uL (ref 4.0–10.5)

## 2024-01-26 LAB — COMPREHENSIVE METABOLIC PANEL WITH GFR
ALT: 29 U/L (ref 0–35)
AST: 40 U/L — ABNORMAL HIGH (ref 0–37)
Albumin: 4.1 g/dL (ref 3.5–5.2)
Alkaline Phosphatase: 96 U/L (ref 39–117)
BUN: 17 mg/dL (ref 6–23)
CO2: 28 meq/L (ref 19–32)
Calcium: 8.9 mg/dL (ref 8.4–10.5)
Chloride: 105 meq/L (ref 96–112)
Creatinine, Ser: 0.96 mg/dL (ref 0.40–1.20)
GFR: 56.47 mL/min — ABNORMAL LOW (ref >60.00)
Glucose, Bld: 86 mg/dL (ref 70–99)
Potassium: 4.5 meq/L (ref 3.5–5.1)
Sodium: 141 meq/L (ref 135–145)
Total Bilirubin: 0.3 mg/dL (ref 0.2–1.2)
Total Protein: 6.7 g/dL (ref 6.0–8.3)

## 2024-01-26 NOTE — Patient Instructions (Addendum)
 Pulmonary emphysema (HCC) - stable  plan - Continue anoro daily scheduled and do nebulizer as needed  - compliance important - high dose flu shot 04/02/2023   Hospital discharge followup from JJuly 2025 Course complicated by anemia and kidney injury  plan - cbc 01/26/2024 - [facility]ET blood work 01/26/2024    Pulmonary nodules - small left upper lobe and right lower lobe  48 pack smoker  CT scan sept 2023 and Oct 2024 reported as stable and without cancer or fibrosis or pneumonia   Plan - CT chest without contrast Nov-Dec 2025    Followup -Nov -Dec 2025 after CT with APP
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
## 2024-01-29 DIAGNOSIS — M4722 Other spondylosis with radiculopathy, cervical region: Secondary | ICD-10-CM | POA: Diagnosis not present

## 2024-01-29 DIAGNOSIS — G894 Chronic pain syndrome: Secondary | ICD-10-CM | POA: Diagnosis not present

## 2024-01-29 DIAGNOSIS — Z79891 Long term (current) use of opiate analgesic: Secondary | ICD-10-CM | POA: Diagnosis not present

## 2024-01-29 DIAGNOSIS — M4726 Other spondylosis with radiculopathy, lumbar region: Secondary | ICD-10-CM | POA: Diagnosis not present

## 2024-02-05 ENCOUNTER — Other Ambulatory Visit: Payer: Self-pay | Admitting: Internal Medicine

## 2024-02-05 ENCOUNTER — Other Ambulatory Visit: Payer: Self-pay | Admitting: Cardiovascular Disease

## 2024-02-09 NOTE — Progress Notes (Unsigned)
 NEUROLOGY FOLLOW UP OFFICE NOTE  [name] [id number]  Assessment/Plan:   Multiple sclerosis Essential tremor    DMT:  none Provigil  200mg  daily Gabapentin  100mg  daily as needed and 300mg  at night Follow up one year  Total time spent on today's visit was 35 minutes dedicated to this patient today, preparing to see patient, examining the patient, ordering tests and/or medications and counseling the patient, documenting clinical information in the EHR or other health record,  answering patient's questions and coordinating care.    Subjective:  [name] is a 78 year old right-handed female with CAD, COPD, emphysema, CHF, thyroid  disease, IBS, arthritis, PTSD, depression, fibromyalgia who follows up for multiple sclerosis.   UPDATE: Current DMT: none Current medications:  Gabapentin  300mg  at night and 100mg  during day if needed; Clonazepam  1mg  during day and 2mg  at bedtime for spasms; D3 2000 IU daily; Provigil  200mg  every morning; Doxepin  25mg  at bedtime; Pristiq  50mg  QPM    Vision:  Permanent vision loss in left eye Motor:  Stable.  Intermittent head tremor.  Sometimes body tremor as well.  However, it is not daily and she feels it is manageable at this time.   Sensory:  stable Pain:  Pain in arms and legs.  Lumbar radiculopathy Gait:  Sometimes requires ambulation with cane or walker.   Bowel/Bladder:  Stable Fatigue:  Yes Cognition:  Short-term memory deficits. Mood:  Depressed.  She misses her dog who is not living with her because she cannot care for him.    HISTORY: First symptom occurred in 1995, when she developed vision loss in the left eye, as well as left eye pain.  MRI of the brain with and without contrast was performed on 03/28/95 revealed 10-12 periventricular white matter lesions and one lesion in the corpus callosum, suspicious for demyelination.  There was also enhancement of the left optic nerve.  She was referred to Dr. Vicenta Purchase, an MS  specialist.  Lumbar puncture was performed, revealing no oligoclonal bands or myelin basic protein.  She has not required hospitalization or steroids since the late 1990s.  She was on Betaseron and later Copaxone .  Copaxone  was discontinued due to longstanding stability, but due to fatigue and cognitive issues, it was restarted in 2014.  Her continued symptoms are episodes of worsening fatigue, as well as cognitive issues.  Sometimes, she has difficulty thinking clearly.  She will get disoriented while driving.  Other times, she has word-finding difficulties.  She once overpaid a bill.  She still performs all ADLs, including paying the bills.  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  Sometimes, when she feels symptoms are worse, she sees a white half a doughnut shape in her visual field.     Past disease-modifying agents:  Betaseron (elevated LFTs and necrotic injection sites), Copaxone    For fatigue, she takes Provigil .  Adderal was ineffective.  Amantadine  was ineffective. For pain in the arms and legs, she takes gabapentin  300mg  at bedtime.  She reportedly has lumbar radiculopathy which also contributes to the leg pain. Initially, she reportedly had significant spasticity.  She was on oral baclofen which did not help.  At one point, a baclofen pump was entertained.  She currently takes clonazepam  1mg  during the day and 2mg  at bedtime, which is effective.   Other imaging: 05/07/95 MRI LUMBAR WO:  mild disc bulging at L4-5 without disc herniation or central canal stenosis. 03/09/96 MRI BRAIN W/WO (comparing to MRI from  03/28/95):  slight increase in number of white matter lesions adjacent to the atrium of the right lateral ventricle, otherwise unchanged. 03/28/98 MRI BRAIN W/WO (comparing to MRI from 03/25/97):  several subtle areas of increased signal in the deep white matter, not as prevalent as on prior study.  No abnormal enhancement. 08/11/13 MRI of brain with and  without contrast :  multiple supratentorial and infratentorial T2 hyperintensities, but no post-contrast enhancement.  However, this was not compared to prior imaging. 01/28/14 MRI Cervical spine without contrast:  spinal cord unremarkable.SABRA MRI of brain with and without contrast from 08/17/14 is stable MRI of brain without contrast from 05/08/15 is stable. MRI of brain with and without contrast from 03/04/17 unchanged from prior study on 05/08/15.  PAST MEDICAL HISTORY: Past Medical History:  Diagnosis Date   Anxiety    Aortic atherosclerosis (HCC) 09/29/2017   High Res CT in 9/18: aortic atherosclerosis; coronary artery calcification   Cervical disc disease    BULGING   Chronic combined systolic and diastolic CHF (congestive heart failure) (HCC) 09/29/2017   Echo 7/18: Moderate concentric LVH, grade 1 diastolic dysfunction, abnormal septal wall motion due to LBBB, moderate diffuse HK, EF 35-40, atrial septal aneurysm with possible PFO, mild TR // Echo 5/19: EF 50-55, normal wall motion, grade 1 diastolic dysfunction, trivial TR   Complication of anesthesia    SLOW TO AWAKEN AFTER LAST COLONSCOPY   Coronary artery disease 05/09/2015   LHC 10/12: EF 55, OM1 80-90 - difficult to engage >> treated medically // Nuc 8/18: EF 40, no ischemia   Depression    DJD (degenerative joint disease)    OA   Dysrhythmia    Not clear where this diagnosis came from   Emphysema of lung (HCC)    Dr. Geronimo   Fibromyalgia    H/O: liver disease 55 YRS AGO   tranamanitis due to previous interferon - LFTs improved off of   History of MI (myocardial infarction) FEW YRS AGO   History of TIA (transient ischemic attack)    Hx of colonic polyps    Hyperlipidemia    intol to statin - myalgias // ESPERION trial - patient stopped   Multiple sclerosis (HCC)    Narcotic dependence (HCC)    hx of benzodiazepine and narcotic   Osteoporosis    Positive H. pylori test    Urinary incontinence      MEDICATIONS: Current Outpatient Medications on File Prior to Visit  Medication Sig Dispense Refill   albuterol  (VENTOLIN  HFA) 108 (90 Base) MCG/ACT inhaler INHALE TWO PUFFS INTO THE LUNGS EVERY SIX HOURS AS NEEDED FOR WHEEZING OR SHORTNESS OF BREATH 8.5 g 3   Ascorbic Acid  (VITAMIN C ) 500 MG CAPS Take 1 capsule by mouth every evening.     aspirin  EC 81 MG tablet Take 81 mg by mouth daily.     CALCIUM  PO Take 1 tablet by mouth every evening.     Cholecalciferol  (VITAMIN D3) 125 MCG (5000 UT) CAPS Take 5,000 Units by mouth every evening.     clonazePAM  (KLONOPIN ) 1 MG tablet Take 1 mg by mouth 3 (three) times daily as needed for anxiety.      COENZYME Q10 PO Take 1 capsule by mouth every evening.     doxepin  (SINEQUAN ) 25 MG capsule Take 1 capsule (25 mg total) by mouth at bedtime. 30 capsule 5   fluticasone (FLONASE) 50 MCG/ACT nasal spray Place 2 sprays into both nostrils daily as needed for rhinitis or allergies.  gabapentin  (NEURONTIN ) 100 MG capsule TAKE ONE CAPSULE (100 MG TOTAL) BY MOUTH DAILY AS NEEDED (PAIN). 30 capsule 5   gabapentin  (NEURONTIN ) 300 MG capsule TAKE ONE CAPSULE BY MOUTH DAILY 30 capsule 3   guaiFENesin (MUCINEX) 600 MG 12 hr tablet Take 600 mg by mouth 2 (two) times daily as needed for cough or to loosen phlegm.      Homeopathic Products (SIMILASAN DRY EYE RELIEF OP) Place 1 drop into both eyes 2 (two) times daily.     ipratropium (ATROVENT ) 0.02 % nebulizer solution ONE VIAL (2 AND ONE HALF (1/2) MLS) BY NEBULIZER FOUR TIMES DAILY 300 mL 5   isosorbide  mononitrate (IMDUR ) 120 MG 24 hr tablet TAKE ONE TABLET BY MOUTH IN THE MORNING AND ONE TABLET BY MOUTH AT BEDTIME. 180 tablet 2   loratadine  (CLARITIN ) 10 MG tablet Take 10 mg by mouth daily.     losartan  (COZAAR ) 25 MG tablet TAKE ONE TABLET BY MOUTH TWICE A DAY 180 tablet 3   lovastatin  (MEVACOR ) 40 MG tablet TAKE ONE TABLET BY MOUTH EVERY NIGHT AT BEDTIME 90 tablet 3   metoprolol  succinate (TOPROL -XL) 25 MG 24  hr tablet Take 1 tablet (25 mg total) by mouth daily. 90 tablet 3   modafinil  (PROVIGIL ) 200 MG tablet TAKE ONE TABLET (200 MG TOTAL) BY MOUTH TWO TIMES DAILY. 60 tablet 1   mometasone  (NASONEX ) 50 MCG/ACT nasal spray SPRAY 2 SPRAYS INTO THE NOSE DAILY 17 g 5   Multiple Vitamin (MULTIVITAMIN WITH MINERALS) TABS tablet Take 1 tablet by mouth every evening.     nitroGLYCERIN  (NITROSTAT ) 0.4 MG SL tablet DISSOLVE ONE TABLET UNDER TONGUE AS NEEDED FOR CHEST PAIN. MAY REPEAT FIVE MINUTES APART THREE TIMES IF NEEDED 25 tablet 3   omeprazole  (PRILOSEC) 20 MG capsule TAKE ONE CAPSULE BY MOUTH ONCE DAILY 30 capsule 5   oxyCODONE -acetaminophen  (PERCOCET) 5-325 MG per tablet Take 1 tablet by mouth every 4 (four) hours as needed for moderate pain or severe pain.     potassium chloride  SA (KLOR-CON  M20) 20 MEQ tablet Take 40 mg ( 2 tablets) daily for 4 days 8 tablet 0   PRISTIQ  50 MG 24 hr tablet Take 50 mg by mouth every evening.      torsemide  (DEMADEX ) 20 MG tablet TAKE ONE TABLET BY MOUTH AS NEEDED FOR SWELLING AND WEIGHT GAIN 90 tablet 3   umeclidinium-vilanterol (ANORO ELLIPTA ) 62.5-25 MCG/ACT AEPB INAHLE ONE PUFF INTO THE LUNGS DAILY 60 each 6   vitamin B-12 (CYANOCOBALAMIN ) 1000 MCG tablet Take 1,500 mcg by mouth daily.     No current facility-administered medications on file prior to visit.    ALLERGIES: Allergies  Allergen Reactions   Contrast Media [Iodinated Contrast Media] Rash and Other (See Comments)    Severe rash   Crestor [Rosuvastatin] Hives and Other (See Comments)    welps   Sulfonamide Derivatives Hives    As a child   Actonel [Risedronate]     Other reaction(s): chest pain   Amoxicillin  Hives and Other (See Comments)    Has patient had a PCN reaction causing immediate rash, facial/tongue/throat swelling, SOB or lightheadedness with hypotension: Yes  Has patient had a PCN reaction causing severe rash involving mucus membranes or skin necrosis: No  Has patient had a PCN  reaction that required hospitalization Yes  Has patient had a PCN reaction occurring within the last 10 years: No  If all of the above answers are NO, then may proceed with Cephalosporin use.  Has patient had a PCN reaction causing immediate rash, facial/tongue/throat swelling, SOB or lightheadedness with hypotension: Yes, Has patient had a PCN reaction causing severe rash involving mucus membranes or skin necrosis: No, Has patient had a PCN reaction that required hospitalization Yes, Has patient had a PCN reaction occurring within the last 10 years: No, If all of the above answers are NO, then may proceed with Cephalosporin use.   Doxycycline  Other (See Comments)   Parathyroid Hormone (Recomb)     Other reaction(s): hair loss or weakness   Sulfamethoxazole Other (See Comments)    FAMILY HISTORY: Family History  Problem Relation Age of Onset   Heart attack Mother 85   Heart attack Father 73   Cancer Brother       Objective:  Blood pressure (!) 128/28, height 5' (1.524 m), weight 119 lb (54 kg). General: No acute distress.  Patient appears well-groomed.   Head:  Normocephalic/atraumatic Eyes:  Fundi examined but not visualized Neck: supple, no paraspinal tenderness, full range of motion Heart:  Regular rate and rhythm Neurological Exam: Alert and oriented.  Speech fluent and not dysarthric. Language intact.  Left monocular vision loss.  Otherwise, CN II-XII intact.  Bulk and tone normal.  Muscle strength 5/5 throughout.  Head tremor.  Left greater than right intention tremor in hands.  Sensation to light touch intact.  Deep tendon reflexes 3+ throughout.  Finger to nose testing intact.  Gait steady.  Romberg negative.   Juliene Dunnings, DO  CC: Vyvyan Sun, MD

## 2024-02-10 ENCOUNTER — Ambulatory Visit: Payer: 59 | Admitting: Neurology

## 2024-02-10 ENCOUNTER — Encounter: Payer: Self-pay | Admitting: Neurology

## 2024-02-10 DIAGNOSIS — G35 Multiple sclerosis: Secondary | ICD-10-CM | POA: Diagnosis not present

## 2024-02-10 MED ORDER — GABAPENTIN 100 MG PO CAPS: ORAL_CAPSULE | ORAL | 5 refills | Status: AC

## 2024-02-10 MED ORDER — GABAPENTIN 300 MG PO CAPS: 300.0000 mg | ORAL_CAPSULE | Freq: Every day | ORAL | 5 refills | Status: AC

## 2024-02-10 MED ORDER — MODAFINIL 200 MG PO TABS: ORAL_TABLET | ORAL | 5 refills | Status: AC

## 2024-02-10 NOTE — Patient Instructions (Signed)
 Refilled: Gabapentin  300mg  Gabapentin  100mg  Modafinil  200mg 

## 2024-02-11 ENCOUNTER — Ambulatory Visit: Payer: Self-pay

## 2024-02-11 NOTE — Telephone Encounter (Signed)
 FYI Only or Action Required?: Action required by provider: clinical question for provider and request for documentation or forms.  Patient is followed in Pulmonology for COPD, Infiltrates, last seen on 01/26/2024 by Geronimo Amel, MD.  Called Nurse Triage reporting No chief complaint on file..  Symptoms began a week ago.  Interventions attempted: Maintenance inhaler.  Symptoms are: gradually worsening.  Triage Disposition: No disposition on file.  Patient/caregiver understands and will follow disposition?:    Copied from CRM [id number]. Topic: Clinical - Red Word Triage >> Feb 11, 2024  2:31 PM Rilla B wrote: [facility] that prompted transfer to Nurse Triage: Difficult Breathing Reason for Disposition  [1] MODERATE longstanding difficulty breathing (e.g., speaks in phrases, SOB even at rest, pulse 100-120) AND [2] SAME as normal  Answer Assessment - Initial Assessment Questions 1. RESPIRATORY STATUS: Describe your breathing? (e.g., wheezing, shortness of breath, unable to speak, severe coughing)      Dyspnea with exertion, intermittently at rest  2. ONSET: When did this breathing problem begin?      Since Last Week  3. PATTERN Does the difficult breathing come and go, or has it been constant since it started?      Intermittent  4. SEVERITY: How bad is your breathing? (e.g., mild, moderate, severe)      Moderate  5. RECURRENT SYMPTOM: Have you had difficulty breathing before? If Yes, ask: When was the last time? and What happened that time?      No  6. CARDIAC HISTORY: Do you have any history of heart disease? (e.g., heart attack, angina, bypass surgery, angioplasty)      CHF  7. LUNG HISTORY: Do you have any history of lung disease?  (e.g., pulmonary embolus, asthma, emphysema)     COPD, Bronchitis  8. CAUSE: What do you think is causing the breathing problem?      Unsure  9. OTHER SYMPTOMS: Do you have any other symptoms? (e.g., chest pain,  cough, dizziness, fever, runny nose)     Denies  10. O2 SATURATION MONITOR:  Do you use an oxygen saturation monitor (pulse oximeter) at home? If Yes, ask: What is your reading (oxygen level) today? What is your usual oxygen saturation reading? (e.g., 95%)       Unable to measure  11. PREGNANCY: Is there any chance you are pregnant? When was your last menstrual period?       No and No  12. TRAVEL: Have you traveled out of the country in the last month? (e.g., travel history, exposures)       Unsure  Protocols used: Breathing Difficulty-A-AH

## 2024-02-12 ENCOUNTER — Telehealth: Payer: Self-pay

## 2024-02-12 DIAGNOSIS — I2510 Atherosclerotic heart disease of native coronary artery without angina pectoris: Secondary | ICD-10-CM | POA: Diagnosis not present

## 2024-02-12 DIAGNOSIS — Z79899 Other long term (current) drug therapy: Secondary | ICD-10-CM | POA: Diagnosis not present

## 2024-02-12 DIAGNOSIS — R0789 Other chest pain: Secondary | ICD-10-CM | POA: Diagnosis not present

## 2024-02-12 DIAGNOSIS — R7989 Other specified abnormal findings of blood chemistry: Secondary | ICD-10-CM | POA: Diagnosis not present

## 2024-02-12 DIAGNOSIS — Z87891 Personal history of nicotine dependence: Secondary | ICD-10-CM | POA: Diagnosis not present

## 2024-02-12 DIAGNOSIS — N179 Acute kidney failure, unspecified: Secondary | ICD-10-CM | POA: Diagnosis not present

## 2024-02-12 NOTE — Telephone Encounter (Signed)
 Copied from CRM #[id number]. Topic: Clinical - Order For Equipment >> Feb 11, 2024  2:29 PM Rilla B wrote: Reason for CRM: Patient is calling and would like to order a portable oxygen tank. Please call patient to follow up [phone]  I do not see where a pt did a qualifying walk for a POC nor was this discussed in the lov. ATC X1. LMTCB

## 2024-02-18 NOTE — Telephone Encounter (Signed)
 ATC X1. Unable to lvm as mb is full.

## 2024-02-24 ENCOUNTER — Telehealth: Payer: Self-pay

## 2024-02-24 NOTE — Telephone Encounter (Signed)
 Copied from CRM #[id number]. Topic: General - Other >> Feb 24, 2024  1:17 PM Whitney O wrote: Reason for CRM: patient says she is returning a call to speak with nurse Javiana Anwar . Called cal no response . Please reach out to patient  [phone]   NFN. See other encounter.

## 2024-02-24 NOTE — Telephone Encounter (Signed)
 Copied from CRM #[id number]. Topic: General - Other >> Feb 24, 2024 12:00 PM Rilla B wrote: Reason for CRM: Patient returning call to Absalom Aro. No answer for CAL. Please call patient @ [phone]  Duplicate. NFN

## 2024-02-24 NOTE — Telephone Encounter (Signed)
 I called and spoke to pt. Pt states she has been using a POC that was a friend's and it no longer works. Pt was requesting a new one. I informed pt that she has never qualified for o2 and we can not order her o2 if she may not need it. I informed p that she would need an ov to do a qualifying walk. Pt verbalized understanding and an appt was made. NFN

## 2024-02-25 ENCOUNTER — Telehealth: Payer: Self-pay | Admitting: Cardiovascular Disease

## 2024-02-25 ENCOUNTER — Other Ambulatory Visit: Payer: Self-pay | Admitting: Emergency Medicine

## 2024-02-25 ENCOUNTER — Other Ambulatory Visit: Payer: Self-pay

## 2024-02-25 DIAGNOSIS — Z5321 Procedure and treatment not carried out due to patient leaving prior to being seen by health care provider: Secondary | ICD-10-CM | POA: Diagnosis not present

## 2024-02-25 DIAGNOSIS — R251 Tremor, unspecified: Secondary | ICD-10-CM | POA: Diagnosis not present

## 2024-02-25 DIAGNOSIS — R208 Other disturbances of skin sensation: Secondary | ICD-10-CM | POA: Diagnosis not present

## 2024-02-25 LAB — CBC WITH DIFFERENTIAL/PLATELET
Abs Immature Granulocytes: 0.02 K/uL (ref 0.00–0.07)
Basophils Absolute: 0 K/uL (ref 0.0–0.1)
Basophils Relative: 1 %
Eosinophils Absolute: 0.5 K/uL (ref 0.0–0.5)
Eosinophils Relative: 5 %
HCT: 32.7 % — ABNORMAL LOW (ref 36.0–46.0)
Hemoglobin: 10.4 g/dL — ABNORMAL LOW (ref 12.0–15.0)
Immature Granulocytes: 0 %
Lymphocytes Relative: 35 %
Lymphs Abs: 3.1 K/uL (ref 0.7–4.0)
MCH: 28.7 pg (ref 26.0–34.0)
MCHC: 31.8 g/dL (ref 30.0–36.0)
MCV: 90.3 fL (ref 80.0–100.0)
Monocytes Absolute: 0.8 K/uL (ref 0.1–1.0)
Monocytes Relative: 9 %
Neutro Abs: 4.4 K/uL (ref 1.7–7.7)
Neutrophils Relative %: 50 %
Platelets: 301 K/uL (ref 150–400)
RBC: 3.62 MIL/uL — ABNORMAL LOW (ref 3.87–5.11)
RDW: 13.9 % (ref 11.5–15.5)
WBC: 8.7 K/uL (ref 4.0–10.5)
nRBC: 0 % (ref 0.0–0.2)

## 2024-02-25 LAB — BASIC METABOLIC PANEL WITH GFR
Anion gap: 10 (ref 5–15)
BUN: 11 mg/dL (ref 8–23)
CO2: 28 mmol/L (ref 22–32)
Calcium: 8.9 mg/dL (ref 8.9–10.3)
Chloride: 97 mmol/L — ABNORMAL LOW (ref 98–111)
Creatinine, Ser: 0.82 mg/dL (ref 0.44–1.00)
GFR, Estimated: >60 mL/min (ref >60)
Glucose, Bld: 95 mg/dL (ref 70–99)
Potassium: 3.5 mmol/L (ref 3.5–5.1)
Sodium: 135 mmol/L (ref 135–145)

## 2024-02-25 NOTE — Telephone Encounter (Signed)
 Called and spoke with daughter. Daughter requesting patient to be seen in clinic. Per daughter patient was recently in jail for DUI. Daughter states that patient's heart medications were confiscated and was not given her medications while in jail. Per daughter she has been off of her medications for two weeks. Daughter would like patient evaluated in clinic. Patient scheduled to be seen on 02/26/24.

## 2024-02-25 NOTE — Telephone Encounter (Signed)
 Pts daughter would like a c/b in regards to pt medications being thrown out during a traffic stop with the police.

## 2024-02-25 NOTE — Telephone Encounter (Signed)
 Pt has an appt on 03/02/2024

## 2024-02-25 NOTE — ED Triage Notes (Signed)
 Pt reports she has a hx tremors but tonight they have gotten worse, pt also reports burning sensation over her entire body

## 2024-02-26 ENCOUNTER — Ambulatory Visit: Attending: Nurse Practitioner | Admitting: Nurse Practitioner

## 2024-02-26 ENCOUNTER — Encounter: Payer: Self-pay | Admitting: Nurse Practitioner

## 2024-02-26 ENCOUNTER — Emergency Department
Admission: EM | Admit: 2024-02-26 | Discharge: 2024-02-26 | Discharge status: Against Medical Advice | Attending: Emergency Medicine | Admitting: Emergency Medicine

## 2024-02-26 VITALS — BP 120/62 | HR 81 | Ht 60.0 in | Wt 112.2 lb

## 2024-02-26 DIAGNOSIS — E785 Hyperlipidemia, unspecified: Secondary | ICD-10-CM

## 2024-02-26 DIAGNOSIS — I428 Other cardiomyopathies: Secondary | ICD-10-CM

## 2024-02-26 DIAGNOSIS — I5032 Chronic diastolic (congestive) heart failure: Secondary | ICD-10-CM | POA: Diagnosis not present

## 2024-02-26 DIAGNOSIS — I2510 Atherosclerotic heart disease of native coronary artery without angina pectoris: Secondary | ICD-10-CM

## 2024-02-26 DIAGNOSIS — R42 Dizziness and giddiness: Secondary | ICD-10-CM | POA: Diagnosis not present

## 2024-02-26 DIAGNOSIS — I10 Essential (primary) hypertension: Secondary | ICD-10-CM

## 2024-02-26 MED ORDER — LOSARTAN POTASSIUM 25 MG PO TABS: 25.0000 mg | ORAL_TABLET | Freq: Every day | ORAL | 2 refills | Status: DC

## 2024-02-26 MED ORDER — NITROGLYCERIN 0.4 MG SL SUBL: 0.4000 mg | SUBLINGUAL_TABLET | SUBLINGUAL | 2 refills | Status: DC | PRN

## 2024-02-26 NOTE — Progress Notes (Signed)
 Office Visit    Patient Name: [name] Date of Encounter: 02/26/2024  Primary Care Provider:  Sun, Vyvyan, MD Primary Cardiologist:  Audrey Lunger, MD  Cardiology APP:  [name] Ingle, NP   Chief Complaint    78 y.o. female w/a h/o moderate nonobstructive CAD, chronic chest pain, fibromyalgia, hypertension, hyperlipidemia, left bundle branch block, chronic heart failure with improved ejection fraction, NICM, and COPD, who presents for f/u related to lightheadedness.  Past Medical History   Subjective   Past Medical History:  Diagnosis Date   Anxiety    Aortic atherosclerosis 09/29/2017   High Res CT in 9/18: aortic atherosclerosis; coronary artery calcification   Cervical disc disease    BULGING   Complication of anesthesia    SLOW TO AWAKEN AFTER LAST COLONSCOPY   Coronary artery disease 05/09/2015   a. 03/2011 Cath: EF 55, OM1 80-90 - difficult to engage->Med rx; b. 10/2017 Cath: LM nl, LAD 40p/m, LCX large, nl, OM1 60, RCA nl. EF 45-50%. Nl R heart pressures; c. 01/2020 MV: Septal infarct, small apical infarct, mild peri-infarct ischemia. Low risk. EF 55-65%.   Depression    DJD (degenerative joint disease)    OA   Emphysema of lung (HCC)    Dr. Geronimo   Fibromyalgia    H/O: liver disease 39 YRS AGO   tranamanitis due to previous interferon - LFTs improved off of   Heart failure with improved ejection fraction (HFimpEF) (HCC)    a. 12/2016 Echo: EF 35-40%, diff HK, apical septal aneurysm, mod conc LVH, gr1 DD, abnl septal wall motion due to LBBB; b. 10/2017 Echo: EF 50-55%, no rwma, Gr1 DD, triv TR.   History of MI (myocardial infarction) FEW YRS AGO   History of TIA (transient ischemic attack)    Hx of colonic polyps    Hyperlipidemia    intol to statin - myalgias // ESPERION trial - patient stopped   Multiple sclerosis    Narcotic dependence (HCC)    hx of benzodiazepine and narcotic   Osteoporosis    Positive H. pylori test    Urinary  incontinence    Past Surgical History:  Procedure Laterality Date   BACK SURGERY  11-2009   LOWER BACK   BOTOX  INJECTION N/A 03/21/2016   Procedure: BOTOX  INJECTION;  Surgeon: Audrey Sol, MD;  Location: [facility];  Service: Endoscopy;  Laterality: N/A;   CARDIAC CATHETERIZATION     UNSUCCESSFUL STENT PLACEMENT   COLONSCOPY     ESOPHAGEAL MANOMETRY N/A 10/23/2015   Procedure: ESOPHAGEAL MANOMETRY (EM);  Surgeon: Audrey Sol, MD;  Location: [facility];  Service: Endoscopy;  Laterality: N/A;   ESOPHAGOGASTRODUODENOSCOPY (EGD) WITH PROPOFOL  N/A 03/21/2016   Procedure: ESOPHAGOGASTRODUODENOSCOPY (EGD) WITH PROPOFOL ;  Surgeon: Audrey Sol, MD;  Location: [facility];  Service: Endoscopy;  Laterality: N/A;   RIGHT/LEFT HEART CATH AND CORONARY ANGIOGRAPHY N/A 10/02/2017   Procedure: RIGHT/LEFT HEART CATH AND CORONARY ANGIOGRAPHY;  Surgeon: Audrey Lonni, MD;  Location: [facility];  Service: Cardiovascular;  Laterality: N/A;   TUBAL LIGATION     VIDEO BRONCHOSCOPY Bilateral 05/18/2019   Procedure: VIDEO BRONCHOSCOPY WITHOUT FLUORO;  Surgeon: [name] Amel, MD;  Location: [facility] ENDOSCOPY;  Service: Endoscopy;  Laterality: Bilateral;    Allergies  Allergies  Allergen Reactions   Contrast Media [Iodinated Contrast Media] Rash and Other (See Comments)    Severe rash   Crestor [Rosuvastatin] Hives and Other (See Comments)    welps   Sulfonamide Derivatives Hives  As a child   Actonel [Risedronate]     Other reaction(s): chest pain   Amoxicillin  Hives and Other (See Comments)    Has patient had a PCN reaction causing immediate rash, facial/tongue/throat swelling, SOB or lightheadedness with hypotension: Yes  Has patient had a PCN reaction causing severe rash involving mucus membranes or skin necrosis: No  Has patient had a PCN reaction that required hospitalization Yes  Has patient had a PCN reaction occurring within the last 10 years: No  If all of the  above answers are NO, then may proceed with Cephalosporin use.  Has patient had a PCN reaction causing immediate rash, facial/tongue/throat swelling, SOB or lightheadedness with hypotension: Yes, Has patient had a PCN reaction causing severe rash involving mucus membranes or skin necrosis: No, Has patient had a PCN reaction that required hospitalization Yes, Has patient had a PCN reaction occurring within the last 10 years: No, If all of the above answers are NO, then may proceed with Cephalosporin use.   Doxycycline  Other (See Comments)   Parathyroid Hormone (Recomb)     Other reaction(s): hair loss or weakness   Sulfamethoxazole Other (See Comments)       History of Present Illness      78 y.o. y/o female w/a h/o moderate nonobstructive CAD, chronic chest pain, fibromyalgia, hypertension, hyperlipidemia, left breast block, chronic heart failure with improved ejection fraction, NICM, and COPD.  She previous underwent diagnostic cardiac catheterization 2012 which showed first obtuse marginal disease.  She was medically managed.  She underwent repeat catheterization and 10/2017, which showed 40% proximal/mid LAD stenosis, 60% OM1 stenosis and otherwise normal coronary arteries, and she was medically managed.  In the setting of recurrent chest pain in August 2021, she underwent stress testing which showed a septal infarct and small apical infarct with mild peri-infarct ischemia.  Overall, the study was felt to be low risk and she was medically managed.  Most recent echo in May 2019 showed EF of 50-55%, improved from 35-40% in July 2018.   [name] was last seen in cardiology clinic in March 2025, at which time she reported recent atypical chest pain which was reproducible on examination and was not felt to require ischemic evaluation.  Approximately 2 to 3 weeks ago, patient began experiencing lightheadedness.  Symptoms exacerbated by position changes but persistent even when she has stood for a  little while.  She was recently imprisoned for driving under the influence and [facility].  She says she did not do anything wrong but believes her chronic use of opioids (oxycodone ) is what showed up in her drug screen prompting her imprisonment.  She says that while there, she was very uncomfortable in the bed and had severe muscle pain and tenderness, especially on the left side of her body.  She says she was seen in the emergency room twice and released both times but is not sure about any findings.  Upon returning home earlier this week, she did not have any medicines but has since had most of them refilled except for her sublingual nitroglycerin  and Klonopin .  She has continued to have predominantly orthostatic lightheadedness as well as diffuse body tenderness.  She was seen in the emergency department last night for ongoing pain.  Lab work was unremarkable.  Patient did not want to wait any longer and left.  She denies dyspnea, palpitations, PND, orthopnea, syncope, edema, or early satiety.  She continues to experience diffuse body tenderness. Objective   Home Medications  Current Outpatient Medications  Medication Sig Dispense Refill   albuterol  (VENTOLIN  HFA) 108 (90 Base) MCG/ACT inhaler INHALE TWO PUFFS INTO THE LUNGS EVERY SIX HOURS AS NEEDED FOR WHEEZING OR SHORTNESS OF BREATH 8.5 g 3   Ascorbic Acid  (VITAMIN C ) 500 MG CAPS Take 1 capsule by mouth every evening.     aspirin  EC 81 MG tablet Take 81 mg by mouth daily.     CALCIUM  PO Take 1 tablet by mouth every evening.     clonazePAM  (KLONOPIN ) 1 MG tablet Take 1 mg by mouth 3 (three) times daily as needed for anxiety.      COENZYME Q10 PO Take 1 capsule by mouth every evening.     doxepin  (SINEQUAN ) 25 MG capsule Take 1 capsule (25 mg total) by mouth at bedtime. 30 capsule 5   fluticasone (FLONASE) 50 MCG/ACT nasal spray Place 2 sprays into both nostrils daily as needed for rhinitis or allergies.     gabapentin  (NEURONTIN ) 100 MG  capsule TAKE ONE CAPSULE (100 MG TOTAL) BY MOUTH DAILY AS NEEDED (PAIN). 30 capsule 5   gabapentin  (NEURONTIN ) 300 MG capsule Take 1 capsule (300 mg total) by mouth daily. 30 capsule 5   guaiFENesin (MUCINEX) 600 MG 12 hr tablet Take 600 mg by mouth 2 (two) times daily as needed for cough or to loosen phlegm.      Homeopathic Products (SIMILASAN DRY EYE RELIEF OP) Place 1 drop into both eyes 2 (two) times daily.     ipratropium (ATROVENT ) 0.02 % nebulizer solution ONE VIAL (2 AND ONE HALF (1/2) MLS) BY NEBULIZER FOUR TIMES DAILY 300 mL 5   isosorbide  mononitrate (IMDUR ) 120 MG 24 hr tablet TAKE ONE TABLET BY MOUTH IN THE MORNING AND ONE TABLET BY MOUTH AT BEDTIME. 180 tablet 2   loratadine  (CLARITIN ) 10 MG tablet Take 10 mg by mouth daily.     losartan  (COZAAR ) 25 MG tablet TAKE ONE TABLET BY MOUTH TWICE A DAY 180 tablet 3   lovastatin  (MEVACOR ) 40 MG tablet TAKE ONE TABLET BY MOUTH EVERY NIGHT AT BEDTIME 90 tablet 3   metoprolol  succinate (TOPROL -XL) 25 MG 24 hr tablet Take 1 tablet (25 mg total) by mouth daily. 90 tablet 3   modafinil  (PROVIGIL ) 200 MG tablet TAKE ONE TABLET (200 MG TOTAL) BY MOUTH TWO TIMES DAILY. 60 tablet 5   mometasone  (NASONEX ) 50 MCG/ACT nasal spray SPRAY 2 SPRAYS INTO THE NOSE DAILY 17 g 5   Multiple Vitamin (MULTIVITAMIN WITH MINERALS) TABS tablet Take 1 tablet by mouth every evening.     nitroGLYCERIN  (NITROSTAT ) 0.4 MG SL tablet DISSOLVE ONE TABLET UNDER TONGUE AS NEEDED FOR CHEST PAIN. MAY REPEAT FIVE MINUTES APART THREE TIMES IF NEEDED 25 tablet 3   omeprazole  (PRILOSEC) 20 MG capsule TAKE ONE CAPSULE BY MOUTH ONCE DAILY 30 capsule 5   oxyCODONE -acetaminophen  (PERCOCET) 5-325 MG per tablet Take 1 tablet by mouth every 4 (four) hours as needed for moderate pain or severe pain.     potassium chloride  SA (KLOR-CON  M20) 20 MEQ tablet Take 40 mg ( 2 tablets) daily for 4 days 8 tablet 0   PRISTIQ  50 MG 24 hr tablet Take 50 mg by mouth every evening.      torsemide  (DEMADEX )  20 MG tablet TAKE ONE TABLET BY MOUTH AS NEEDED FOR SWELLING AND WEIGHT GAIN 90 tablet 3   umeclidinium-vilanterol (ANORO ELLIPTA ) 62.5-25 MCG/ACT AEPB INAHLE ONE PUFF INTO THE LUNGS DAILY 60 each 6   vitamin B-12 (CYANOCOBALAMIN )  1000 MCG tablet Take 1,500 mcg by mouth daily.     Cholecalciferol  (VITAMIN D3) 125 MCG (5000 UT) CAPS Take 5,000 Units by mouth every evening. (Patient not taking: Reported on 02/26/2024)     No current facility-administered medications for this visit.     Physical Exam    VS:  BP 120/62 (BP Location: Left Arm, Patient Position: Sitting, Cuff Size: Normal)   Pulse 81   Ht 5' (1.524 m)   Wt 112 lb 4 oz (50.9 kg)   SpO2 95%   BMI 21.92 kg/m  , BMI Body mass index is 21.92 kg/m. Orthostatic VS for the past 24 hrs (Last 3 readings):  BP- Lying Pulse- Lying BP- Sitting Pulse- Sitting BP- Standing at 0 minutes Pulse- Standing at 0 minutes BP- Standing at 3 minutes Pulse- Standing at 3 minutes  02/26/24 0826 135/74 78 123/77 80 121/66 87 117/77 81           GEN: Well nourished, well developed, in no acute distress. HEENT: normal. Neck: Supple, no JVD, carotid bruits, or masses. Cardiac: RRR, no murmurs, rubs, or gallops. No clubbing, cyanosis, edema.  Radials 2+/PT 2+ and equal bilaterally.  Tenderness to light palpation noted across her chest. Respiratory:  Respirations regular and unlabored, clear to auscultation bilaterally. GI: Soft, nontender, nondistended, BS + x 4. MS: no deformity or atrophy. Skin: warm and dry, no rash. Neuro:  Strength and sensation are intact. Psych: Normal affect.  Accessory Clinical Findings    ECG personally reviewed by me today - EKG Interpretation Date/Time:  Thursday February 26 2024 08:23:43 EDT Ventricular Rate:  81 PR Interval:  152 QRS Duration:  132 QT Interval:  414 QTC Calculation: 480 R Axis:   -20  Text Interpretation: Normal sinus rhythm Left bundle branch block Confirmed by Audrey Bruckner [phone]) on  02/26/2024 8:41:47 AM  - no acute changes.  Lab Results  Component Value Date   WBC 8.7 02/25/2024   HGB 10.4 (L) 02/25/2024   HCT 32.7 (L) 02/25/2024   MCV 90.3 02/25/2024   PLT 301 02/25/2024   Lab Results  Component Value Date   CREATININE 0.82 02/25/2024   BUN 11 02/25/2024   NA 135 02/25/2024   K 3.5 02/25/2024   CL 97 (L) 02/25/2024   CO2 28 02/25/2024   Lab Results  Component Value Date   ALT 29 01/26/2024   AST 40 (H) 01/26/2024   ALKPHOS 96 01/26/2024   BILITOT 0.3 01/26/2024   Lab Results  Component Value Date   CHOL 128 05/08/2023   HDL 52 05/08/2023   LDLCALC 56 05/08/2023   TRIG 107 05/08/2023   CHOLHDL 2.5 05/08/2023    Lab Results  Component Value Date   HGBA1C 5.9 (H) 05/09/2015       Assessment & Plan    1.  Orthostatic lightheadedness: Over the past few weeks, patient has been experiencing orthostasis and lightheadedness.  Symptoms are most pronounced following standing and she does have a drop in blood pressure from 135 lying to 117 associated lightheadedness, after standing for several minutes.  Reviewed her medications with her today.  I am going to reduce her losartan  to 25 mg once a day (previously taking twice a day).  She is also on long-acting nitrate and beta-blocker therapy.  Unclear that drop in blood pressure cannot entirely explain the lightheadedness as her lightheadedness persists even after she has been up for a while.  If symptoms do not improve with reduction in antihypertensive  therapy, I encouraged her to follow-up with her primary care provider.  2.  Nonobstructive CAD/chronic chest pain: Nonobstructive CAD on catheterization in 2019 with low risk stress test in August 2021 (septal infarct and small apical infarct with mild peri-infarct ischemia).  She continues to experience intermittent and at times constant chest pain and tenderness that is worse with palpation.  On examination today, she had tenderness with my stethoscope on her  chest.  ECG today with sinus rhythm and left bundle branch block, which is chronic.  Defer any ischemic testing at this time.  She remains on low-dose aspirin , beta-blocker, nitrate therapy.  3.  Nonischemic cardiomyopathy/chronic heart failure with improved ejection fraction: Most recent echo in May 2019 with an EF of 50-55% and no regional wall motion abnormalities, grade 1 diastolic dysfunction.  Euvolemic on examination.  Denies dyspnea.  Continue beta-blocker and ARB (reducing dose due to orthostasis).  She has torsemide  at home but only uses once or twice a month.  4.  Primary hypertension: With orthostatic lightheadedness.  Reducing losartan  to 25 mg daily today.  She remains on beta-blocker and nitrate.  5.  Hyperlipidemia: Continue statin therapy.  LDL was 56 last December.  6.  Chronic pain/fibromyalgia: Patient with diffuse body wall tenderness over the past few weeks, which she attributes to sleeping on a hard bed while in prison for 10 days.  She is diffusely tender on examination.  Recommend follow-up primary care.  She is on chronic narcotic therapy.  7.  Disposition: Follow-up in 3 months or sooner if necessary.  [name] Meager, NP 02/26/2024, 8:41 AM

## 2024-02-26 NOTE — Patient Instructions (Signed)
 Medication Instructions:  Your physician recommends the following medication changes.  DECREASE: Losartan  25 mg twice a day to only once a day  Take all other medications as prescribed by provider.  *If you need a refill on your cardiac medications before your next appointment, please call your pharmacy*  Lab Work: No labs ordered today  If you have labs (blood work) drawn today and your tests are completely normal, you will receive your results only by: MyChart Message (if you have MyChart) OR A paper copy in the mail If you have any lab test that is abnormal or we need to change your treatment, we will call you to review the results.  Testing/Procedures: No test ordered today   Follow-Up: At [facility], you and your health needs are our priority.  As part of our continuing mission to provide you with exceptional heart care, our providers are all part of one team.  This team includes your primary Cardiologist (physician) and Advanced Practice Providers or APPs (Physician Assistants and Nurse Practitioners) who all work together to provide you with the care you need, when you need it.  Your next appointment:   3 month(s)  Provider:    Timothy Gollan, MD or Lonni Meager, NP    We recommend signing up for the patient portal called MyChart.  Sign up information is provided on this After Visit Summary.  MyChart is used to connect with patients for Virtual Visits (Telemedicine).  Patients are able to view lab/test results, encounter notes, upcoming appointments, etc.  Non-urgent messages can be sent to your provider as well.   To learn more about what you can do with MyChart, go to [email or web address].

## 2024-02-27 DIAGNOSIS — R350 Frequency of micturition: Secondary | ICD-10-CM | POA: Diagnosis not present

## 2024-03-02 ENCOUNTER — Ambulatory Visit: Admitting: Primary Care

## 2024-03-02 DIAGNOSIS — R911 Solitary pulmonary nodule: Secondary | ICD-10-CM

## 2024-03-12 DIAGNOSIS — R10A2 Flank pain, left side: Secondary | ICD-10-CM | POA: Diagnosis not present

## 2024-03-22 DIAGNOSIS — Z79891 Long term (current) use of opiate analgesic: Secondary | ICD-10-CM | POA: Diagnosis not present

## 2024-03-22 DIAGNOSIS — M4726 Other spondylosis with radiculopathy, lumbar region: Secondary | ICD-10-CM | POA: Diagnosis not present

## 2024-03-22 DIAGNOSIS — G894 Chronic pain syndrome: Secondary | ICD-10-CM | POA: Diagnosis not present

## 2024-03-22 DIAGNOSIS — M4722 Other spondylosis with radiculopathy, cervical region: Secondary | ICD-10-CM | POA: Diagnosis not present

## 2024-03-23 ENCOUNTER — Other Ambulatory Visit: Payer: Self-pay | Admitting: Internal Medicine

## 2024-03-29 ENCOUNTER — Other Ambulatory Visit: Payer: Self-pay | Admitting: Internal Medicine

## 2024-04-15 ENCOUNTER — Other Ambulatory Visit

## 2024-05-24 ENCOUNTER — Ambulatory Visit
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
## 2024-06-01 ENCOUNTER — Ambulatory Visit: Admitting: Cardiovascular Disease

## 2024-06-01 DIAGNOSIS — I5020 Unspecified systolic (congestive) heart failure: Secondary | ICD-10-CM

## 2024-06-01 DIAGNOSIS — I700 Atherosclerosis of aorta: Secondary | ICD-10-CM

## 2024-06-01 DIAGNOSIS — I10 Essential (primary) hypertension: Secondary | ICD-10-CM

## 2024-06-01 DIAGNOSIS — R42 Dizziness and giddiness: Secondary | ICD-10-CM

## 2024-06-01 DIAGNOSIS — I2510 Atherosclerotic heart disease of native coronary artery without angina pectoris: Secondary | ICD-10-CM

## 2024-06-01 DIAGNOSIS — I428 Other cardiomyopathies: Secondary | ICD-10-CM

## 2024-06-01 DIAGNOSIS — G459 Transient cerebral ischemic attack, unspecified: Secondary | ICD-10-CM

## 2024-06-01 DIAGNOSIS — E785 Hyperlipidemia, unspecified: Secondary | ICD-10-CM

## 2024-06-02 ENCOUNTER — Ambulatory Visit

## 2024-06-10 ENCOUNTER — Other Ambulatory Visit: Payer: Self-pay | Admitting: Nurse Practitioner

## 2024-06-10 ENCOUNTER — Other Ambulatory Visit: Payer: Self-pay | Admitting: Cardiovascular Disease

## 2024-07-08 ENCOUNTER — Telehealth: Payer: Self-pay

## 2024-07-08 NOTE — Telephone Encounter (Signed)
 Copied from CRM #[id number]. Topic: Clinical - Medication Refill >> Jul 08, 2024 10:39 AM Russell PARAS wrote: Medication:   umeclidinium-vilanterol (ANORO ELLIPTA ) 62.5-25 MCG/ACT AEPB ipratropium (ATROVENT ) 0.02 % nebulizer solution   Has the patient contacted their pharmacy? Yes (Agent: If no, request that the patient contact the pharmacy for the refill. If patient does not wish to contact the pharmacy document the reason why and proceed with request.) (Agent: If yes, when and what did the pharmacy advise?)  This is the patient's preferred pharmacy:   [id number] [place], [place] - [address] AT [facility] OF [address] [place] [address] Phone: [phone] Fax: [phone]  Is this the correct pharmacy for this prescription? Yes If no, delete pharmacy and type the correct one.   Has the prescription been filled recently? Yes  Is the patient out of the medication? No  Has the patient been seen for an appointment in the last year OR does the patient have an upcoming appointment? Yes  Can we respond through MyChart? No  Agent: Please be advised that Rx refills may take up to 3 business days. We ask that you follow-up with your pharmacy.   Spoke with patient VBU, has been scheduled for over due f/u hadf canceled last one

## 2024-07-13 ENCOUNTER — Ambulatory Visit: Admitting: Cardiovascular Disease

## 2024-07-15 ENCOUNTER — Ambulatory Visit: Admitting: Internal Medicine

## 2025-02-09 ENCOUNTER — Ambulatory Visit: Admitting: Neurology
